# Patient Record
Sex: Female | Born: 1940
Health system: Southern US, Community
[De-identification: ages and names within clinical notes are randomized; demographics above are authoritative.]

## PROBLEM LIST (undated history)

## (undated) DIAGNOSIS — L02619 Cutaneous abscess of unspecified foot: Secondary | ICD-10-CM

## (undated) DIAGNOSIS — Z87442 Personal history of urinary calculi: Secondary | ICD-10-CM

## (undated) DIAGNOSIS — K5790 Diverticulosis of intestine, part unspecified, without perforation or abscess without bleeding: Secondary | ICD-10-CM

## (undated) DIAGNOSIS — K635 Polyp of colon: Secondary | ICD-10-CM

## (undated) DIAGNOSIS — E785 Hyperlipidemia, unspecified: Secondary | ICD-10-CM

## (undated) DIAGNOSIS — Z9981 Dependence on supplemental oxygen: Secondary | ICD-10-CM

## (undated) DIAGNOSIS — D649 Anemia, unspecified: Secondary | ICD-10-CM

## (undated) DIAGNOSIS — Z8719 Personal history of other diseases of the digestive system: Secondary | ICD-10-CM

## (undated) DIAGNOSIS — L03119 Cellulitis of unspecified part of limb: Secondary | ICD-10-CM

## (undated) DIAGNOSIS — J449 Chronic obstructive pulmonary disease, unspecified: Secondary | ICD-10-CM

## (undated) HISTORY — DX: Polyp of colon: K63.5

## (undated) HISTORY — PX: CATARACT EXTRACTION: SUR2

## (undated) HISTORY — PX: TONSILLECTOMY: SUR1361

## (undated) HISTORY — DX: Diverticulosis of intestine, part unspecified, without perforation or abscess without bleeding: K57.90

## (undated) HISTORY — DX: Dependence on supplemental oxygen: Z99.81

## (undated) HISTORY — DX: Hyperlipidemia, unspecified: E78.5

---

## 2002-02-23 ENCOUNTER — Ambulatory Visit (HOSPITAL_BASED_OUTPATIENT_CLINIC_OR_DEPARTMENT_OTHER): Admission: RE | Admit: 2002-02-23 | Discharge: 2002-02-23 | Payer: Self-pay | Admitting: Urology

## 2005-06-09 ENCOUNTER — Emergency Department (HOSPITAL_COMMUNITY): Admission: EM | Admit: 2005-06-09 | Discharge: 2005-06-09 | Payer: Self-pay | Admitting: Emergency Medicine

## 2005-06-11 ENCOUNTER — Ambulatory Visit: Payer: Self-pay | Admitting: Internal Medicine

## 2007-07-21 DIAGNOSIS — D649 Anemia, unspecified: Secondary | ICD-10-CM | POA: Insufficient documentation

## 2008-09-26 ENCOUNTER — Ambulatory Visit: Payer: Self-pay | Admitting: Internal Medicine

## 2008-09-26 ENCOUNTER — Inpatient Hospital Stay (HOSPITAL_COMMUNITY): Admission: EM | Admit: 2008-09-26 | Discharge: 2008-09-30 | Payer: Self-pay | Admitting: Emergency Medicine

## 2008-11-05 ENCOUNTER — Ambulatory Visit: Payer: Self-pay | Admitting: Internal Medicine

## 2008-11-05 DIAGNOSIS — J449 Chronic obstructive pulmonary disease, unspecified: Secondary | ICD-10-CM | POA: Insufficient documentation

## 2008-11-05 DIAGNOSIS — R7309 Other abnormal glucose: Secondary | ICD-10-CM | POA: Insufficient documentation

## 2008-11-05 DIAGNOSIS — F17200 Nicotine dependence, unspecified, uncomplicated: Secondary | ICD-10-CM | POA: Insufficient documentation

## 2008-12-04 ENCOUNTER — Ambulatory Visit: Payer: Self-pay | Admitting: Gastroenterology

## 2008-12-20 ENCOUNTER — Ambulatory Visit: Payer: Self-pay | Admitting: Gastroenterology

## 2008-12-20 ENCOUNTER — Encounter: Payer: Self-pay | Admitting: Gastroenterology

## 2008-12-25 ENCOUNTER — Encounter: Payer: Self-pay | Admitting: Gastroenterology

## 2009-03-07 ENCOUNTER — Encounter: Payer: Self-pay | Admitting: Internal Medicine

## 2010-12-07 ENCOUNTER — Encounter: Payer: Self-pay | Admitting: Internal Medicine

## 2011-03-31 NOTE — Discharge Summary (Signed)
Valerie Gay, Valerie Gay NO.:  [id number]   MEDICAL RECORD NO.:  [id number]          PATIENT TYPE:  INP   LOCATION:  5121                         FACILITY:  [facility]   PHYSICIAN:  Willow Ora, MD           DATE OF BIRTH:  1941/08/04   DATE OF ADMISSION:  09/26/2008  DATE OF DISCHARGE:  09/30/2008                               DISCHARGE SUMMARY   PRIMARY CARE DOCTOR:  Valetta Mole. Swords, MD at [facility] [place],  [place].   BRIEF HISTORY AND PHYSICAL:  Valerie Gay is a 70 year old white  female, current smoker, who presented to the emergency room with dyspnea  for approximately 4 days preceded by upper respiratory tract infection  and sore throat.  She admits to cough and nasal congestion.  Denies any  chest pain or palpitations.   PHYSICAL EXAMINATION:  VITAL SIGNS:  Blood pressure was 144/76, heart  rate 113, respiration rate 30, and O2 sat 89% on room air and 99% on non  rebreather mask.  CARDIOVASCULAR:  Tachycardic without a murmur.  LUNGS:  Poor air movement, tachypnea, and expiratory wheezing.  ABDOMEN:  Soft, nontender, and nondistended.  EXTREMITIES:  No edema.   LABORATORY AND X-RAYS:  Initial white count was 11.4 with a hemoglobin  of 14.4 and platelets of 454.  Potassium 4.1, creatinine 0.3, and blood  sugar was 141.  Cardiac enzymes were negative.  Calcium was negative.  Blood cultures are negative at the time of discharge.  D-dimer was  negative 0.34.  Hemoglobin A1c was 6.0.  BNP which is B-type natriuretic  peptide was 50 which is normal.  TSH was 3.5 which is normal.  Chest x-  ray showed emphysema without acute findings.   HOSPITAL COURSE:  The patient was admitted to the hospital with COPD  exacerbation as described above.  She was started on IV Solu-Medrol,  scheduled nebulizations, and Mucinex as well as Avelox and Tamiflu.  During this hospitalization, she also received a pneumonia shot.  She  slowly improved.  Prior to discharge, she was  looking and feeling very  comfortable with her breathing.  We checked her O2 sat on room air and  it was 92%.  During this admission, she was also counseled about the new  onset of borderline diabetes and I encouraged her to talk with her  primary care doctor regarding this diagnosis.  She was also counseled  about the risks of tobacco.  At this point, she got maximal hospital  benefit and she will be discharged with the following instructions.  1. Albuterol either nebulizations or 2 puffs every 6 hours as needed      for cough and wheezing.  2. Prednisone taper.  3. Avelox 400 mg p.o. daily for the next 3 days.  4. Mucinex DM 1 tablet twice a day until better.  5. She was advised to see her primary care doctor, Dr. Birdie Sons,      next week to follow up on her breathing and the new onset of      borderline diabetes.  6. We will add  Flonase 2 puffs daily on each side of the nose because      she seems to be slightly congested today.   ADMISSION DIAGNOSIS:  Chronic obstructive pulmonary disease  exacerbation.   DISCHARGE DIAGNOSES:  1. Chronic obstructive pulmonary disease exacerbation, improved.  2. New onset of borderline diabetes with a hemoglobin A1c of 6.0,      noting that the CBGs are in the low 100s even with prednisone by      mouth, to be followed up as an outpatient.  3. Ongoing tobacco abuse, counseling during this hospitalization.      Willow Ora, MD  Electronically Signed     JP/MEDQ  D:  09/30/2008  T:  10/01/2008  Job:  [id number]   cc:   Valetta Mole. Swords, MD

## 2011-04-03 NOTE — Op Note (Signed)
[facility]  Patient:    MARKAN, CAZAREZ Visit Number: [id number] MRN: [id number]          Service Type: NES Location: [facility] Attending Physician:  Lauree Chandler Dictated by:   Maretta Bees. Vonita Moss, M.D. Proc. Date: 02/23/02 Admit Date:  02/23/2002                             Operative Report  PREOPERATIVE DIAGNOSIS:  Gross hematuria, suspected from the right kidney and suspected mild hydronephrosis; rule out TCC.  POSTOPERATIVE DIAGNOSES:  Hematuria due to right renal calculus.  PROCEDURE:  Cystoscopy, bilateral retrograde pyelograms with interpretation, right ureteroscopy and right renal stone basketing with insertion of right double-J catheter.  SURGEON:  Maretta Bees. Vonita Moss, M.D.  ANESTHESIA:  General.  INDICATIONS:  A 70 year old lady who had grossly bloody urine last week, and renal ultrasound raised the question of slight fullness of the right collecting system in the kidney.  Cystoscopy revealed blood come from the right ureteral orifice.  The bladder was negative.  She is brought to the OR today for evaluation.  DESCRIPTION OF PROCEDURE:  The patient is brought to the operating room and placed in lithotomy position.  The external genitalia are prepped and draped in sterile fashion. She was cystoscoped and the bladder was normal.  Bilateral retrograde pyelograms were performed, and the left upper tract was normal. The right upper tract revealed a significant calyectasis.  Guide wire was placed up the right ureter and over that a ureteral dilating sheath was put into position.  Using the thin flexible ureteroscope, I was able to visualize the upper ureter and renal collecting system; saw no papillary tumors, but I did find a small irregular calculus. I used the Mitinol basket to grasp it and remove part of the stone, but some other smaller fragments were not retrievable.  Therefore, I decided to leave in a right double-J  catheter, which I did, and it was 6-French and 26 cm in size; with no string on it.  The bladder was emptied.  Cystoscope removed.  The patient sent to recovery room in good condition.Dictated by:   Maretta Bees. Vonita Moss, M.D. Attending Physician:  Lauree Chandler DD:  02/23/02 TD:  02/23/02 Job: [id number]

## 2011-08-18 LAB — CK TOTAL AND CKMB (NOT AT ARMC)
CK, MB: 2.9
Total CK: 43

## 2011-08-18 LAB — URINALYSIS, ROUTINE W REFLEX MICROSCOPIC: Ketones, ur: >80 — AB

## 2011-08-18 LAB — GLUCOSE, CAPILLARY
Glucose-Capillary: 121 — ABNORMAL HIGH
Glucose-Capillary: 124 — ABNORMAL HIGH
Glucose-Capillary: 134 — ABNORMAL HIGH
Glucose-Capillary: 136 — ABNORMAL HIGH
Glucose-Capillary: 137 — ABNORMAL HIGH
Glucose-Capillary: 138 — ABNORMAL HIGH

## 2011-08-18 LAB — CREATININE, SERUM
Creatinine, Ser: 0.74
GFR calc Af Amer: >60
GFR calc non Af Amer: >60

## 2011-08-18 LAB — TSH: TSH: 3.578

## 2011-08-18 LAB — CBC
HCT: 37
HCT: 43.4
Hemoglobin: 12.4
MCHC: 33.3
MCHC: 33.4
MCV: 96.6
Platelets: 351
RBC: 3.83 — ABNORMAL LOW
RBC: 4.51
RDW: 13.4
WBC: 11.4 — ABNORMAL HIGH

## 2011-08-18 LAB — CULTURE, BLOOD (ROUTINE X 2): Culture: NO GROWTH

## 2011-08-18 LAB — URINE MICROSCOPIC-ADD ON

## 2011-08-18 LAB — LIPID PANEL
HDL: 34 — ABNORMAL LOW
Triglycerides: 71

## 2011-08-18 LAB — CARDIAC PANEL(CRET KIN+CKTOT+MB+TROPI)
Relative Index: 3.9 — ABNORMAL HIGH
Relative Index: INVALID
Troponin I: <0.01

## 2011-08-18 LAB — BASIC METABOLIC PANEL
BUN: 15
CO2: 26
Chloride: 105
GFR calc non Af Amer: >60
Potassium: 4.1

## 2011-08-18 LAB — DIFFERENTIAL
Eosinophils Absolute: 0.1
Eosinophils Relative: 1
Monocytes Relative: 6
Neutro Abs: 8.1 — ABNORMAL HIGH

## 2011-08-18 LAB — HEMOGLOBIN A1C
Hgb A1c MFr Bld: 6
Mean Plasma Glucose: 126

## 2011-08-18 LAB — TROPONIN I: Troponin I: 0.01

## 2011-12-14 DIAGNOSIS — R059 Cough, unspecified: Secondary | ICD-10-CM | POA: Diagnosis not present

## 2011-12-14 DIAGNOSIS — J0100 Acute maxillary sinusitis, unspecified: Secondary | ICD-10-CM | POA: Diagnosis not present

## 2011-12-14 DIAGNOSIS — R05 Cough: Secondary | ICD-10-CM | POA: Diagnosis not present

## 2012-07-01 DIAGNOSIS — J209 Acute bronchitis, unspecified: Secondary | ICD-10-CM | POA: Diagnosis not present

## 2012-07-01 DIAGNOSIS — J449 Chronic obstructive pulmonary disease, unspecified: Secondary | ICD-10-CM | POA: Diagnosis not present

## 2012-09-21 DIAGNOSIS — H53009 Unspecified amblyopia, unspecified eye: Secondary | ICD-10-CM | POA: Diagnosis not present

## 2012-09-21 DIAGNOSIS — H25049 Posterior subcapsular polar age-related cataract, unspecified eye: Secondary | ICD-10-CM | POA: Diagnosis not present

## 2012-09-21 DIAGNOSIS — H3530 Unspecified macular degeneration: Secondary | ICD-10-CM | POA: Diagnosis not present

## 2012-09-21 DIAGNOSIS — H40019 Open angle with borderline findings, low risk, unspecified eye: Secondary | ICD-10-CM | POA: Diagnosis not present

## 2012-09-27 DIAGNOSIS — H353190 Nonexudative age-related macular degeneration, unspecified eye, stage unspecified: Secondary | ICD-10-CM | POA: Diagnosis not present

## 2012-09-27 DIAGNOSIS — H353290 Exudative age-related macular degeneration, unspecified eye, stage unspecified: Secondary | ICD-10-CM | POA: Diagnosis not present

## 2012-10-19 DIAGNOSIS — J449 Chronic obstructive pulmonary disease, unspecified: Secondary | ICD-10-CM | POA: Diagnosis not present

## 2012-10-19 DIAGNOSIS — J398 Other specified diseases of upper respiratory tract: Secondary | ICD-10-CM | POA: Diagnosis not present

## 2012-10-25 DIAGNOSIS — H353290 Exudative age-related macular degeneration, unspecified eye, stage unspecified: Secondary | ICD-10-CM | POA: Diagnosis not present

## 2012-11-24 DIAGNOSIS — Z23 Encounter for immunization: Secondary | ICD-10-CM | POA: Diagnosis not present

## 2012-11-29 DIAGNOSIS — H353290 Exudative age-related macular degeneration, unspecified eye, stage unspecified: Secondary | ICD-10-CM | POA: Diagnosis not present

## 2012-12-27 DIAGNOSIS — H353190 Nonexudative age-related macular degeneration, unspecified eye, stage unspecified: Secondary | ICD-10-CM | POA: Diagnosis not present

## 2012-12-27 DIAGNOSIS — H353290 Exudative age-related macular degeneration, unspecified eye, stage unspecified: Secondary | ICD-10-CM | POA: Diagnosis not present

## 2013-02-07 DIAGNOSIS — H353290 Exudative age-related macular degeneration, unspecified eye, stage unspecified: Secondary | ICD-10-CM | POA: Diagnosis not present

## 2013-03-28 DIAGNOSIS — H353290 Exudative age-related macular degeneration, unspecified eye, stage unspecified: Secondary | ICD-10-CM | POA: Diagnosis not present

## 2013-05-16 DIAGNOSIS — H353290 Exudative age-related macular degeneration, unspecified eye, stage unspecified: Secondary | ICD-10-CM | POA: Diagnosis not present

## 2013-07-13 DIAGNOSIS — H353290 Exudative age-related macular degeneration, unspecified eye, stage unspecified: Secondary | ICD-10-CM | POA: Diagnosis not present

## 2013-07-13 DIAGNOSIS — H353190 Nonexudative age-related macular degeneration, unspecified eye, stage unspecified: Secondary | ICD-10-CM | POA: Diagnosis not present

## 2013-07-13 DIAGNOSIS — H2510 Age-related nuclear cataract, unspecified eye: Secondary | ICD-10-CM | POA: Diagnosis not present

## 2013-08-04 DIAGNOSIS — H40059 Ocular hypertension, unspecified eye: Secondary | ICD-10-CM | POA: Diagnosis not present

## 2013-08-04 DIAGNOSIS — H25049 Posterior subcapsular polar age-related cataract, unspecified eye: Secondary | ICD-10-CM | POA: Diagnosis not present

## 2013-08-04 DIAGNOSIS — H353290 Exudative age-related macular degeneration, unspecified eye, stage unspecified: Secondary | ICD-10-CM | POA: Diagnosis not present

## 2013-08-04 DIAGNOSIS — H2510 Age-related nuclear cataract, unspecified eye: Secondary | ICD-10-CM | POA: Diagnosis not present

## 2013-08-15 DIAGNOSIS — H25049 Posterior subcapsular polar age-related cataract, unspecified eye: Secondary | ICD-10-CM | POA: Diagnosis not present

## 2013-08-15 DIAGNOSIS — H2510 Age-related nuclear cataract, unspecified eye: Secondary | ICD-10-CM | POA: Diagnosis not present

## 2013-08-15 DIAGNOSIS — H25019 Cortical age-related cataract, unspecified eye: Secondary | ICD-10-CM | POA: Diagnosis not present

## 2013-08-15 DIAGNOSIS — H2589 Other age-related cataract: Secondary | ICD-10-CM | POA: Diagnosis not present

## 2013-08-24 DIAGNOSIS — H353290 Exudative age-related macular degeneration, unspecified eye, stage unspecified: Secondary | ICD-10-CM | POA: Diagnosis not present

## 2013-10-07 DIAGNOSIS — J209 Acute bronchitis, unspecified: Secondary | ICD-10-CM | POA: Diagnosis not present

## 2013-10-07 DIAGNOSIS — J449 Chronic obstructive pulmonary disease, unspecified: Secondary | ICD-10-CM | POA: Diagnosis not present

## 2013-10-19 DIAGNOSIS — H353290 Exudative age-related macular degeneration, unspecified eye, stage unspecified: Secondary | ICD-10-CM | POA: Diagnosis not present

## 2013-10-30 ENCOUNTER — Encounter: Payer: Self-pay | Admitting: *Deleted

## 2013-11-01 ENCOUNTER — Encounter: Payer: Self-pay | Admitting: Gastroenterology

## 2013-12-12 DIAGNOSIS — H353190 Nonexudative age-related macular degeneration, unspecified eye, stage unspecified: Secondary | ICD-10-CM | POA: Diagnosis not present

## 2013-12-12 DIAGNOSIS — H353290 Exudative age-related macular degeneration, unspecified eye, stage unspecified: Secondary | ICD-10-CM | POA: Diagnosis not present

## 2013-12-12 DIAGNOSIS — H43819 Vitreous degeneration, unspecified eye: Secondary | ICD-10-CM | POA: Diagnosis not present

## 2013-12-12 DIAGNOSIS — H179 Unspecified corneal scar and opacity: Secondary | ICD-10-CM | POA: Diagnosis not present

## 2014-01-16 DIAGNOSIS — J449 Chronic obstructive pulmonary disease, unspecified: Secondary | ICD-10-CM | POA: Diagnosis not present

## 2014-02-27 DIAGNOSIS — H353290 Exudative age-related macular degeneration, unspecified eye, stage unspecified: Secondary | ICD-10-CM | POA: Diagnosis not present

## 2014-03-27 ENCOUNTER — Inpatient Hospital Stay (HOSPITAL_COMMUNITY)
Admission: EM | Admit: 2014-03-27 | Discharge: 2014-03-30 | DRG: 193 | Disposition: A | Discharge status: Home / Self Care | Payer: Medicare Other | Attending: Internal Medicine | Admitting: Internal Medicine

## 2014-03-27 ENCOUNTER — Encounter (HOSPITAL_COMMUNITY): Payer: Self-pay | Admitting: Emergency Medicine

## 2014-03-27 ENCOUNTER — Emergency Department (HOSPITAL_COMMUNITY): Payer: Medicare Other

## 2014-03-27 DIAGNOSIS — R062 Wheezing: Secondary | ICD-10-CM | POA: Diagnosis not present

## 2014-03-27 DIAGNOSIS — I10 Essential (primary) hypertension: Secondary | ICD-10-CM | POA: Diagnosis not present

## 2014-03-27 DIAGNOSIS — R0602 Shortness of breath: Secondary | ICD-10-CM | POA: Diagnosis not present

## 2014-03-27 DIAGNOSIS — J441 Chronic obstructive pulmonary disease with (acute) exacerbation: Secondary | ICD-10-CM | POA: Diagnosis not present

## 2014-03-27 DIAGNOSIS — J9600 Acute respiratory failure, unspecified whether with hypoxia or hypercapnia: Secondary | ICD-10-CM | POA: Diagnosis present

## 2014-03-27 DIAGNOSIS — Z7982 Long term (current) use of aspirin: Secondary | ICD-10-CM | POA: Diagnosis not present

## 2014-03-27 DIAGNOSIS — Z79899 Other long term (current) drug therapy: Secondary | ICD-10-CM | POA: Diagnosis not present

## 2014-03-27 DIAGNOSIS — F17200 Nicotine dependence, unspecified, uncomplicated: Secondary | ICD-10-CM | POA: Diagnosis present

## 2014-03-27 DIAGNOSIS — J449 Chronic obstructive pulmonary disease, unspecified: Secondary | ICD-10-CM

## 2014-03-27 DIAGNOSIS — D72829 Elevated white blood cell count, unspecified: Secondary | ICD-10-CM

## 2014-03-27 DIAGNOSIS — J189 Pneumonia, unspecified organism: Secondary | ICD-10-CM | POA: Diagnosis not present

## 2014-03-27 DIAGNOSIS — Z720 Tobacco use: Secondary | ICD-10-CM

## 2014-03-27 DIAGNOSIS — J4489 Other specified chronic obstructive pulmonary disease: Secondary | ICD-10-CM

## 2014-03-27 DIAGNOSIS — D649 Anemia, unspecified: Secondary | ICD-10-CM | POA: Diagnosis not present

## 2014-03-27 DIAGNOSIS — J9620 Acute and chronic respiratory failure, unspecified whether with hypoxia or hypercapnia: Secondary | ICD-10-CM | POA: Diagnosis present

## 2014-03-27 DIAGNOSIS — E876 Hypokalemia: Secondary | ICD-10-CM

## 2014-03-27 HISTORY — DX: Chronic obstructive pulmonary disease, unspecified: J44.9

## 2014-03-27 LAB — I-STAT TROPONIN, ED: TROPONIN I, POC: 0 ng/mL (ref 0.00–0.08)

## 2014-03-27 LAB — BASIC METABOLIC PANEL
BUN: 20 mg/dL (ref 6–23)
CO2: 26 mEq/L (ref 19–32)
Calcium: 9.6 mg/dL (ref 8.4–10.5)
Chloride: 98 mEq/L (ref 96–112)
Creatinine, Ser: 0.53 mg/dL (ref 0.50–1.10)
Glucose, Bld: 104 mg/dL — ABNORMAL HIGH (ref 70–99)
POTASSIUM: 3.4 meq/L — AB (ref 3.7–5.3)
SODIUM: 138 meq/L (ref 137–147)

## 2014-03-27 LAB — CBC
HCT: 39.4 % (ref 36.0–46.0)
HEMOGLOBIN: 13.2 g/dL (ref 12.0–15.0)
MCH: 31.9 pg (ref 26.0–34.0)
MCHC: 33.5 g/dL (ref 30.0–36.0)
MCV: 95.2 fL (ref 78.0–100.0)
Platelets: 480 10*3/uL — ABNORMAL HIGH (ref 150–400)
RBC: 4.14 MIL/uL (ref 3.87–5.11)
RDW: 13.6 % (ref 11.5–15.5)
WBC: 21.1 10*3/uL — AB (ref 4.0–10.5)

## 2014-03-27 MED ORDER — DEXTROSE 5 % IV SOLN
1.0000 g | INTRAVENOUS | Status: DC
  Administered 2014-03-27 – 2014-03-29 (×3): 1 g via INTRAVENOUS
  Filled 2014-03-27 (×4): qty 10

## 2014-03-27 MED ORDER — IPRATROPIUM BROMIDE 0.02 % IN SOLN
0.5000 mg | Freq: Once | RESPIRATORY_TRACT | Status: AC
  Administered 2014-03-27: 0.5 mg via RESPIRATORY_TRACT
  Filled 2014-03-27: qty 2.5

## 2014-03-27 MED ORDER — SODIUM CHLORIDE 0.9 % IV SOLN: INTRAVENOUS | Status: DC

## 2014-03-27 MED ORDER — METHYLPREDNISOLONE SODIUM SUCC 125 MG IJ SOLR
125.0000 mg | Freq: Once | INTRAMUSCULAR | Status: AC
  Administered 2014-03-27: 125 mg via INTRAVENOUS
  Filled 2014-03-27: qty 2

## 2014-03-27 MED ORDER — BUDESONIDE-FORMOTEROL FUMARATE 160-4.5 MCG/ACT IN AERO
2.0000 | INHALATION_SPRAY | Freq: Two times a day (BID) | RESPIRATORY_TRACT | Status: DC
  Administered 2014-03-28 – 2014-03-30 (×5): 2 via RESPIRATORY_TRACT
  Filled 2014-03-27 (×2): qty 6

## 2014-03-27 MED ORDER — DEXTROSE 5 % IV SOLN
500.0000 mg | INTRAVENOUS | Status: DC
  Administered 2014-03-27 – 2014-03-29 (×3): 500 mg via INTRAVENOUS
  Filled 2014-03-27 (×3): qty 500

## 2014-03-27 MED ORDER — ALBUTEROL SULFATE (2.5 MG/3ML) 0.083% IN NEBU
5.0000 mg | INHALATION_SOLUTION | Freq: Once | RESPIRATORY_TRACT | Status: AC
  Administered 2014-03-27: 5 mg via RESPIRATORY_TRACT
  Filled 2014-03-27: qty 6

## 2014-03-27 MED ORDER — SODIUM CHLORIDE 0.9 % IV SOLN
INTRAVENOUS | Status: DC
  Administered 2014-03-27: 21:00:00 via INTRAVENOUS
  Administered 2014-03-28: 1000 mL via INTRAVENOUS

## 2014-03-27 MED ORDER — POTASSIUM CHLORIDE CRYS ER 20 MEQ PO TBCR
20.0000 meq | EXTENDED_RELEASE_TABLET | Freq: Once | ORAL | Status: AC
  Administered 2014-03-27: 20 meq via ORAL
  Filled 2014-03-27: qty 1

## 2014-03-27 MED ORDER — METHYLPREDNISOLONE SODIUM SUCC 125 MG IJ SOLR
60.0000 mg | Freq: Four times a day (QID) | INTRAMUSCULAR | Status: DC
  Administered 2014-03-27 – 2014-03-28 (×3): 60 mg via INTRAVENOUS
  Filled 2014-03-27 (×8): qty 0.96

## 2014-03-27 MED ORDER — LEVOFLOXACIN IN D5W 750 MG/150ML IV SOLN
750.0000 mg | Freq: Once | INTRAVENOUS | Status: AC
  Administered 2014-03-27: 750 mg via INTRAVENOUS
  Filled 2014-03-27: qty 150

## 2014-03-27 MED ORDER — GUAIFENESIN-CODEINE 100-10 MG/5ML PO SOLN: 5.0000 mL | Freq: Four times a day (QID) | ORAL | Status: DC | PRN

## 2014-03-27 MED ORDER — PNEUMOCOCCAL VAC POLYVALENT 25 MCG/0.5ML IJ INJ
0.5000 mL | INJECTION | INTRAMUSCULAR | Status: AC
  Administered 2014-03-28: 0.5 mL via INTRAMUSCULAR
  Filled 2014-03-27: qty 0.5

## 2014-03-27 MED ORDER — ALBUTEROL SULFATE (2.5 MG/3ML) 0.083% IN NEBU: 5.0000 mg | INHALATION_SOLUTION | RESPIRATORY_TRACT | Status: AC | PRN

## 2014-03-27 MED ORDER — SODIUM CHLORIDE 0.9 % IV BOLUS (SEPSIS)
1000.0000 mL | Freq: Once | INTRAVENOUS | Status: AC
  Administered 2014-03-27: 1000 mL via INTRAVENOUS

## 2014-03-27 MED ORDER — NICOTINE 21 MG/24HR TD PT24: 21.0000 mg | MEDICATED_PATCH | Freq: Every day | TRANSDERMAL | Status: DC

## 2014-03-27 MED ORDER — IPRATROPIUM BROMIDE 0.02 % IN SOLN
0.5000 mg | Freq: Four times a day (QID) | RESPIRATORY_TRACT | Status: DC
  Administered 2014-03-27 – 2014-03-29 (×9): 0.5 mg via RESPIRATORY_TRACT
  Filled 2014-03-27 (×9): qty 2.5

## 2014-03-27 MED ORDER — LEVALBUTEROL HCL 0.63 MG/3ML IN NEBU
0.6300 mg | INHALATION_SOLUTION | Freq: Four times a day (QID) | RESPIRATORY_TRACT | Status: DC
  Administered 2014-03-27 – 2014-03-29 (×9): 0.63 mg via RESPIRATORY_TRACT
  Filled 2014-03-27 (×16): qty 3

## 2014-03-27 MED ORDER — BENZONATATE 100 MG PO CAPS
100.0000 mg | ORAL_CAPSULE | Freq: Three times a day (TID) | ORAL | Status: DC | PRN
  Filled 2014-03-27: qty 1

## 2014-03-27 MED ORDER — NICOTINE 21 MG/24HR TD PT24
21.0000 mg | MEDICATED_PATCH | TRANSDERMAL | Status: DC
  Administered 2014-03-27 – 2014-03-29 (×3): 21 mg via TRANSDERMAL
  Filled 2014-03-27 (×4): qty 1

## 2014-03-27 MED ORDER — ENOXAPARIN SODIUM 40 MG/0.4ML ~~LOC~~ SOLN
40.0000 mg | SUBCUTANEOUS | Status: DC
  Administered 2014-03-27 – 2014-03-29 (×3): 40 mg via SUBCUTANEOUS
  Filled 2014-03-27 (×4): qty 0.4

## 2014-03-27 MED ORDER — ASPIRIN EC 81 MG PO TBEC
81.0000 mg | DELAYED_RELEASE_TABLET | Freq: Every day | ORAL | Status: DC
  Administered 2014-03-27 – 2014-03-30 (×4): 81 mg via ORAL
  Filled 2014-03-27 (×4): qty 1

## 2014-03-27 MED ORDER — LEVALBUTEROL HCL 0.63 MG/3ML IN NEBU: 0.6300 mg | INHALATION_SOLUTION | RESPIRATORY_TRACT | Status: DC | PRN

## 2014-03-27 NOTE — Progress Notes (Signed)
NURSING PROGRESS NOTE  Valerie Gay [id number] Admission Data: 03/27/2014 6:57 PM Attending Provider: Mendel Corning, MD PCP:No PCP Per Patient Code Status: DNR  Valerie Gay is a 73 y.o. female patient admitted from ED:  -No acute distress noted.  -No complaints of shortness of breath.  -No complaints of chest pain.   Cardiac Monitoring: Box # [id number] in place. Cardiac monitor yields:normal sinus rhythm.  Blood pressure 123/75, pulse 110, temperature 98.1 F (36.7 C), temperature source Oral, resp. rate 33, height 5\' 4"  (1.626 m), weight 72.576 kg (160 lb), SpO2 98.00%.   IV Fluids:  IV in place, occlusive dsg intact without redness, IV cath antecubital left, condition patent and no redness normal saline @ 75.   Allergies:  Review of patient's allergies indicates no known allergies.  Past Medical History:   has a past medical history of Diverticulosis; Colon polyps; and COPD (chronic obstructive pulmonary disease).  Past Surgical History:   has past surgical history that includes Tonsillectomy.  Social History:   reports that she has been smoking Cigarettes.  She has been smoking about 0.50 packs per day. She does not have any smokeless tobacco history on file. She reports that she does not drink alcohol.  Skin: warm, dry intact

## 2014-03-27 NOTE — ED Notes (Signed)
Pt states that she had had increasing SOB over the last several days. States that she had to use her rescue inhaler this morning. Pt also reports a productive cough. Denies chest pain.

## 2014-03-27 NOTE — ED Notes (Signed)
Pt ambulated to restroom, exertional shortness of breath noted- lowest oxygen level 85% on room air, pt placed back on 2 liters and oxygen back up to 98%, pt denies any pain at this time.

## 2014-03-27 NOTE — H&P (Signed)
History and Physical       Hospital Admission Note Date: 03/27/2014  Patient name: Valerie Gay Medical record number: [id number] Date of birth: 1941/03/29 Age: 73 y.o. Gender: female  PCP: No PCP Per Patient    Chief Complaint:  Shortness of breath with wheezing, worsening in the last 3 days  HPI: Patient is a 73 year old female with history of COPD, nicotine abuse who has not been following with any physician presented to the ER with a 3 days of worsening chest tightness, difficulty breathing, wheezing and coughing. Patient reported that she was using multiple albuterol nebulizer treatments today without improvement. She had been blowing leaves in her yard 3 days ago on Saturday and since then her symptoms has worsened. She did have congestion and sore throat a few days ago. Patient took about 2-3 doses of 10 mg prednisone this weekend. She reported that her wheezing and shortness of breath worsened acutely today when she came to the ER. Otherwise she denied any recent long-distance travels or peripheral edema. In the ER patient was diffusely wheezing and increased WOB, tachypnea and tachycardia, requiring 02, hence hospitalist service was consulted for admission.   Review of Systems:  Constitutional: Denies fever, chills, diaphoresis, poor appetite and fatigue.  HEENT: Denies photophobia, eye pain, redness, hearing loss, ear pain, +congestion, + sore throat,+ rhinorrhea, no sneezing, mouth sores, trouble swallowing, neck pain, neck stiffness and tinnitus.   Respiratory: Please see history of present illness  Cardiovascular: Denies chest pain, palpitations and leg swelling.  Gastrointestinal: Denies nausea, vomiting, abdominal pain, diarrhea, constipation, blood in stool and abdominal distention.  Genitourinary: Denies dysuria, urgency, frequency, hematuria, flank pain and difficulty urinating.  Musculoskeletal: Denies myalgias,  back pain, joint swelling, arthralgias and gait problem.  Skin: Denies pallor, rash and wound.  Neurological: Denies dizziness, seizures, syncope, weakness, light-headedness, numbness and headaches.  Hematological: Denies adenopathy. Easy bruising, personal or family bleeding history  Psychiatric/Behavioral: Denies suicidal ideation, mood changes, confusion, nervousness, sleep disturbance and agitation  Past Medical History: Past Medical History  Diagnosis Date  . Diverticulosis   . Colon polyps     2010   . COPD (chronic obstructive pulmonary disease)    Past Surgical History  Procedure Laterality Date  . Tonsillectomy      Medications: Prior to Admission medications   Medication Sig Start Date End Date Taking? Authorizing Provider  albuterol (PROVENTIL) (2.5 MG/3ML) 0.083% nebulizer solution Take 2.5 mg by nebulization every 8 (eight) hours as needed for wheezing or shortness of breath.   Yes Historical Provider, MD  aspirin 81 MG tablet Take 81-162 mg by mouth daily.   Yes Historical Provider, MD  budesonide-formoterol (SYMBICORT) 160-4.5 MCG/ACT inhaler Inhale 2 puffs into the lungs 2 (two) times daily.   Yes Historical Provider, MD  ibuprofen (ADVIL,MOTRIN) 200 MG tablet Take 200 mg by mouth every 6 (six) hours as needed for moderate pain.   Yes Historical Provider, MD    Allergies:  No Known Allergies  Social History:  reports that she has been smoking Cigarettes.  She has been smoking about 0.50 packs per day. She does not have any smokeless tobacco history on file. She reports that she does not drink alcohol. Her drug history is not on file.  Family History: History reviewed. No pertinent family history.  Physical Exam: Blood pressure 121/93, pulse 119, temperature 98.6 F (37 C), temperature source Oral, resp. rate 23, height 5\' 4"  (1.626 m), weight 72.576 kg (160 lb), SpO2 95.00%. General: Alert, awake,  oriented x3, in no acute distress, able to complete full  sentences. HEENT: normocephalic, atraumatic, anicteric sclera, pink conjunctiva, pupils equal and reactive to light and accomodation, oropharynx clear Neck: supple, no masses or lymphadenopathy, no goiter, no bruits  Heart: Regular rate and rhythm, without murmurs, rubs or gallops. Lungs: Diffuse expiratory wheezing bilaterally, tight Abdomen: Soft, nontender, nondistended, positive bowel sounds, no masses. Extremities: No clubbing, cyanosis or edema with positive pedal pulses. Neuro: Grossly intact, no focal neurological deficits, strength 5/5 upper and lower extremities bilaterally Psych: alert and oriented x 3, normal mood and affect Skin: no rashes or lesions, warm and dry   LABS on Admission:  Basic Metabolic Panel:  Recent Labs Lab 03/27/14 1215  NA 138  K 3.4*  CL 98  CO2 26  GLUCOSE 104*  BUN 20  CREATININE 0.53  CALCIUM 9.6   Liver Function Tests: No results found for this basename: AST, ALT, ALKPHOS, BILITOT, PROT, ALBUMIN,  in the last 168 hours No results found for this basename: LIPASE, AMYLASE,  in the last 168 hours No results found for this basename: AMMONIA,  in the last 168 hours CBC:  Recent Labs Lab 03/27/14 1215  WBC 21.1*  HGB 13.2  HCT 39.4  MCV 95.2  PLT 480*   Cardiac Enzymes: No results found for this basename: CKTOTAL, CKMB, CKMBINDEX, TROPONINI,  in the last 168 hours BNP: No components found with this basename: POCBNP,  CBG: No results found for this basename: GLUCAP,  in the last 168 hours   Radiological Exams on Admission: Dg Chest 2 View (if Patient Has Fever And/or Copd)  03/27/2014   CLINICAL DATA:  Short of breath and cough  EXAM: CHEST  2 VIEW  COMPARISON:  Prior chest x-ray 09/26/2008  FINDINGS: Borderline cardiomegaly. Unchanged mediastinal contours. Trace atherosclerotic calcification in the transverse aorta. Increased patchy airspace opacity in the right lower lobe concerning for bronchopneumonia. This is superimposed on a  background of chronic bronchitic changes and interstitial prominence consistent with the clinical history of COPD. Probable upper lung predominant emphysematous changes. No pneumothorax or pleural effusion. The lungs are hyperinflated. No acute osseous abnormality.  IMPRESSION: Ill-defined patchy opacity in the right lung base left frontal view concerning for early bronchopneumonia. Recommend imaging follow-up to resolution to exclude underlying malignancy.  Background changes of COPD and emphysema.   Electronically Signed   By: Jacqulynn Cadet M.D.   On: 03/27/2014 13:47    Assessment/Plan Principal Problem:   Respiratory failure, acute secondary to COPD exacerbation with community-acquired pneumonia - Patient had some improvement in ED after multiple albuterol treatments, IV Solu-Medrol - Will admit to scheduled bronchodilators, placed on IV Solu-Medrol, IV antibiotics with Zithromax and Rocephin - Continue O2 via nasal cannula, Symbicort, Tessalon Perles, Robitussin, incentive spirometry - Obtain urine Legionella antigen, urine strep antigen  Active Problems:    Nicotine abuse - Patient has been smoking half to one pack per day, counseled strongly on smoking cessation - Placed on nicotine patch    Hypokalemia - Potassium being replaced in the ED, recheck in a.m.    Leukocytosis - Likely due to community-acquired pneumonia and steroids ( patient took 2-3 doses of 10 mg prednisone this weekend)   DVT prophylaxis: Lovenox  CODE STATUS: DNR/DNI, Okay for BiPAP  Family Communication: Admission, patients condition and plan of care including tests being ordered have been discussed with the patient who indicates understanding and agree with the plan and Code Status   Further plan will depend as  patient's clinical course evolves and further radiologic and laboratory data become available.   Time Spent on Admission: 1 hour  Ripudeep Krystal Eaton M.D. [facility] 03/27/2014, 3:55  PM Pager: [phone]  If 7PM-7AM, please contact night-coverage [email or web address] Password [email or web address]  **Disclaimer: This note was dictated with voice recognition software. Similar sounding words can inadvertently be transcribed and this note may contain transcription errors which may not have been corrected upon publication of note.**

## 2014-03-27 NOTE — ED Provider Notes (Signed)
Medical screening examination/treatment/procedure(s) were conducted as a shared visit with non-physician practitioner(s) and myself.  I personally evaluated the patient during the encounter.   EKG Interpretation   Date/Time:  Tuesday Mar 27 2014 12:47:20 EDT Ventricular Rate:  118 PR Interval:  138 QRS Duration: 72 QT Interval:  331 QTC Calculation: 464 R Axis:   93 Text Interpretation:  Age not entered, assumed to be  73 years old for  purpose of ECG interpretation Sinus tachycardia Anterior infarct, old No  significant change since last tracing Confirmed by Alauna Hayden  MD, [place]  ([id number]) on 03/27/2014 3:45:53 PM      I interviewed and examined the patient. Pt is mildly tachypneic, diffuse mild wheezing w/ diminished air flow. Cardiac exam wnl. Abdomen soft.  Minimal improvement w/ neb x 3. CXR w/ PNA. Will admit.   Blanchard Kelch, MD 03/27/14 2018

## 2014-03-27 NOTE — ED Provider Notes (Signed)
CSN: [id number]     Arrival date & time 03/27/14  1155 History   First MD Initiated Contact with Patient 03/27/14 1209     Chief Complaint  Patient presents with  . Shortness of Breath     (Consider location/radiation/quality/duration/timing/severity/associated sxs/prior Treatment) HPI  Patient with hx COPD reports two days worsening chest tightness, shortness of breath, wheezing, and cough.  Cough is productive of green and yellow sputum, different from her baseline white sputum.  Is taking all of her COPD medications at home.  Has taken multiple albuterol nebulizer treatments today without improvement.  States she was out blowing leaves 3 days ago and was exposured to a lot of pollen, which she states exacerbated her symptoms. She also had sore throat a few days ago but this gas resolved.  Took 2-3 doses 10mg  prednisone this weekend.    Denies recent immobilization, recent antibiotics, exogenous estrogen, leg swelling, hx blood clots.    Past Medical History  Diagnosis Date  . Diverticulosis   . Colon polyps     2010   . COPD (chronic obstructive pulmonary disease)    Past Surgical History  Procedure Laterality Date  . Tonsillectomy     History reviewed. No pertinent family history. History  Substance Use Topics  . Smoking status: Current Every Day Smoker -- 0.50 packs/day    Types: Cigarettes  . Smokeless tobacco: Not on file  . Alcohol Use: No   OB History   Grav Para Term Preterm Abortions TAB SAB Ect Mult Living                 Review of Systems  All other systems reviewed and are negative.     Allergies  Review of patient's allergies indicates no known allergies.  Home Medications   Prior to Admission medications   Not on File   BP 138/59  Pulse 119  Temp(Src) 98.2 F (36.8 C) (Oral)  Resp 24  Ht 5\' 4"  (1.626 m)  Wt 160 lb (72.576 kg)  BMI 27.45 kg/m2  SpO2 92% Physical Exam  Nursing note and vitals reviewed. Constitutional: She appears  well-developed and well-nourished. No distress.  HENT:  Head: Normocephalic and atraumatic.  Neck: Neck supple.  Cardiovascular: Normal rate and regular rhythm.   Pulmonary/Chest: Accessory muscle usage present. Tachypnea noted. No respiratory distress. She has decreased breath sounds. She has wheezes. She has no rhonchi. She has no rales.  Abdominal: Soft. She exhibits no distension. There is no tenderness. There is no rebound and no guarding.  Neurological: She is alert.  Skin: She is not diaphoretic.    ED Course  Procedures (including critical care time) Labs Review Labs Reviewed  BASIC METABOLIC PANEL - Abnormal; Notable for the following:    Potassium 3.4 (*)    Glucose, Bld 104 (*)    All other components within normal limits  CBC - Abnormal; Notable for the following:    WBC 21.1 (*)    Platelets 480 (*)    All other components within normal limits  I-STAT TROPOININ, ED    Imaging Review Dg Chest 2 View (if Patient Has Fever And/or Copd)  03/27/2014   CLINICAL DATA:  Short of breath and cough  EXAM: CHEST  2 VIEW  COMPARISON:  Prior chest x-ray 09/26/2008  FINDINGS: Borderline cardiomegaly. Unchanged mediastinal contours. Trace atherosclerotic calcification in the transverse aorta. Increased patchy airspace opacity in the right lower lobe concerning for bronchopneumonia. This is superimposed on a background of chronic bronchitic  changes and interstitial prominence consistent with the clinical history of COPD. Probable upper lung predominant emphysematous changes. No pneumothorax or pleural effusion. The lungs are hyperinflated. No acute osseous abnormality.  IMPRESSION: Ill-defined patchy opacity in the right lung base left frontal view concerning for early bronchopneumonia. Recommend imaging follow-up to resolution to exclude underlying malignancy.  Background changes of COPD and emphysema.   Electronically Signed   By: Jacqulynn Cadet M.D.   On: 03/27/2014 13:47     EKG  Interpretation None      1:46 PM Pt feels slight improvement with neb treatment.  Still not moving air well on exam.    Dr Aline Brochure made aware of the patient.   3:03 PM Pt starting to move air a bit more, now starting to wheeze.    MDM   Final diagnoses:  COPD (chronic obstructive pulmonary disease)  CAP (community acquired pneumonia)    Pt with hx COPD with 3 days of worsening SOB, increased work of breathing, change in sputum.  Pt only showing mild improvement with neb treatments.  CXR shows pneumonia.  Treated with levaquin.  Admitted to [facility] as unassigned patient.    Clayton Bibles, PA-C 03/27/14 1553

## 2014-03-28 DIAGNOSIS — D649 Anemia, unspecified: Secondary | ICD-10-CM | POA: Diagnosis not present

## 2014-03-28 DIAGNOSIS — D72829 Elevated white blood cell count, unspecified: Secondary | ICD-10-CM

## 2014-03-28 DIAGNOSIS — F17200 Nicotine dependence, unspecified, uncomplicated: Secondary | ICD-10-CM

## 2014-03-28 DIAGNOSIS — J449 Chronic obstructive pulmonary disease, unspecified: Secondary | ICD-10-CM | POA: Diagnosis not present

## 2014-03-28 DIAGNOSIS — J441 Chronic obstructive pulmonary disease with (acute) exacerbation: Secondary | ICD-10-CM | POA: Diagnosis not present

## 2014-03-28 DIAGNOSIS — J189 Pneumonia, unspecified organism: Secondary | ICD-10-CM | POA: Diagnosis not present

## 2014-03-28 LAB — BASIC METABOLIC PANEL
BUN: 22 mg/dL (ref 6–23)
CALCIUM: 8.7 mg/dL (ref 8.4–10.5)
CO2: 23 mEq/L (ref 19–32)
Chloride: 104 mEq/L (ref 96–112)
Creatinine, Ser: 0.54 mg/dL (ref 0.50–1.10)
GFR calc Af Amer: >90 mL/min (ref >90)
GLUCOSE: 143 mg/dL — AB (ref 70–99)
Potassium: 4.2 mEq/L (ref 3.7–5.3)
SODIUM: 140 meq/L (ref 137–147)

## 2014-03-28 LAB — CBC
HCT: 33.4 % — ABNORMAL LOW (ref 36.0–46.0)
Hemoglobin: 11 g/dL — ABNORMAL LOW (ref 12.0–15.0)
MCH: 31.7 pg (ref 26.0–34.0)
MCHC: 32.9 g/dL (ref 30.0–36.0)
MCV: 96.3 fL (ref 78.0–100.0)
PLATELETS: 427 10*3/uL — AB (ref 150–400)
RBC: 3.47 MIL/uL — ABNORMAL LOW (ref 3.87–5.11)
RDW: 13.9 % (ref 11.5–15.5)
WBC: 19.3 10*3/uL — AB (ref 4.0–10.5)

## 2014-03-28 MED ORDER — PREDNISONE 50 MG PO TABS
60.0000 mg | ORAL_TABLET | Freq: Every day | ORAL | Status: DC
  Administered 2014-03-29 – 2014-03-30 (×2): 60 mg via ORAL
  Filled 2014-03-28 (×3): qty 1

## 2014-03-28 NOTE — Progress Notes (Signed)
03/28/2014 1520 UR completed. Jonnie Finner RN CCM Case Mgmt phone [phone]

## 2014-03-28 NOTE — Progress Notes (Signed)
Chaplain responded to spiritual care consult for major life transition, "disruption in support system." Patient expressed feeling well today and having no emotional or spiritual needs. She said she is very "independent." She said her support comes from her family, whom "she feels she can talk about anything." She said her daughter and son-in-law visited last night, but her son is caring for a sick grandson at home. Chaplain performed spiritual care assessment and provided emotional support through empathic listening and pastoral presence, introduced patient to chaplain services and availability. She was grateful for visit.   Ethelene Browns [phone]

## 2014-03-28 NOTE — Progress Notes (Signed)
[facility] PROGRESS NOTE   Valerie Gay ENI:[id number] DOB: [birth year] DOA: 03/27/2014 PCP: No PCP Per Patient  HPI/Subjective: Feels much better, denies any fever. Still have some shortness of breath and cough.  Assessment/Plan: Principal Problem:   Respiratory failure, acute Active Problems:   COPD exacerbation   CAP (community acquired pneumonia)   Nicotine abuse   Hypokalemia   Leukocytosis    Acute respiratory failure  - Secondary to COPD exacerbation with community-acquired pneumonia  - Patient had some improvement in ED after multiple albuterol treatments, IV Solu-Medrol  - Will admit to scheduled bronchodilators, placed on IV Solu-Medrol, IV antibiotics with Zithromax and Rocephin  - Continue O2 via nasal cannula, Symbicort, Tessalon Perles, Robitussin, incentive spirometry  - Obtain urine Legionella antigen, urine strep antigen   RLL pneumonia -Community acquired pneumonia involving the RLL. -Started on antibiotics, denies any fever chills or night. -Continue supportive management with bronchodilators, mucolytics, until symptoms and oxygen as needed.  Tobacco abuse  - Patient has been smoking half to one pack per day, counseled strongly on smoking cessation  - Placed on nicotine patch   Hypokalemia  - Potassium being replaced in the ED, recheck in a.m.   Leukocytosis  - Likely due to community-acquired pneumonia and steroids ( patient took 2-3 doses of 10 mg prednisone this weekend)    Code Status: Full code Family Communication: Plan discussed with the patient. Disposition Plan: Remains inpatient   Consultants:  *None  Procedures:  None  Antibiotics:  Azithromycin and Rocephin   Objective: Filed Vitals:   03/28/14 0459  BP: 110/68  Pulse: 78  Temp: 98 F (36.7 C)  Resp: 20    Intake/Output Summary (Last 24 hours) at 03/28/14 1153 Last data filed at 03/28/14 0800  Gross per 24 hour  Intake    300 ml  Output    300 ml    Net      0 ml   Filed Weights   03/27/14 1206 03/27/14 1840  Weight: 72.576 kg (160 lb) 68.675 kg (151 lb 6.4 oz)    Exam: General: Alert and awake, oriented x3, not in any acute distress. HEENT: anicteric sclera, pupils reactive to light and accommodation, EOMI CVS: S1-S2 clear, no murmur rubs or gallops Chest: clear to auscultation bilaterally, no wheezing, rales or rhonchi Abdomen: soft nontender, nondistended, normal bowel sounds, no organomegaly Extremities: no cyanosis, clubbing or edema noted bilaterally Neuro: Cranial nerves II-XII intact, no focal neurological deficits  Data Reviewed: Basic Metabolic Panel:  Recent Labs Lab 03/27/14 1215 03/28/14 0426  NA 138 140  K 3.4* 4.2  CL 98 104  CO2 26 23  GLUCOSE 104* 143*  BUN 20 22  CREATININE 0.53 0.54  CALCIUM 9.6 8.7   Liver Function Tests: No results found for this basename: AST, ALT, ALKPHOS, BILITOT, PROT, ALBUMIN,  in the last 168 hours No results found for this basename: LIPASE, AMYLASE,  in the last 168 hours No results found for this basename: AMMONIA,  in the last 168 hours CBC:  Recent Labs Lab 03/27/14 1215 03/28/14 0426  WBC 21.1* 19.3*  HGB 13.2 11.0*  HCT 39.4 33.4*  MCV 95.2 96.3  PLT 480* 427*   Cardiac Enzymes: No results found for this basename: CKTOTAL, CKMB, CKMBINDEX, TROPONINI,  in the last 168 hours BNP (last 3 results) No results found for this basename: PROBNP,  in the last 8760 hours CBG: No results found for this basename: GLUCAP,  in the last 168 hours  Micro No results found for this or any previous visit (from the past 240 hour(s)).   Studies: Dg Chest 2 View (if Patient Has Fever And/or Copd)  03/27/2014   CLINICAL DATA:  Short of breath and cough  EXAM: CHEST  2 VIEW  COMPARISON:  Prior chest x-ray 09/26/2008  FINDINGS: Borderline cardiomegaly. Unchanged mediastinal contours. Trace atherosclerotic calcification in the transverse aorta. Increased patchy airspace opacity  in the right lower lobe concerning for bronchopneumonia. This is superimposed on a background of chronic bronchitic changes and interstitial prominence consistent with the clinical history of COPD. Probable upper lung predominant emphysematous changes. No pneumothorax or pleural effusion. The lungs are hyperinflated. No acute osseous abnormality.  IMPRESSION: Ill-defined patchy opacity in the right lung base left frontal view concerning for early bronchopneumonia. Recommend imaging follow-up to resolution to exclude underlying malignancy.  Background changes of COPD and emphysema.   Electronically Signed   By: Jacqulynn Cadet M.D.   On: 03/27/2014 13:47    Scheduled Meds: . sodium chloride   Intravenous STAT  . aspirin EC  81 mg Oral Daily  . azithromycin  500 mg Intravenous Q24H  . budesonide-formoterol  2 puff Inhalation BID  . cefTRIAXone (ROCEPHIN)  IV  1 g Intravenous Q24H  . enoxaparin (LOVENOX) injection  40 mg Subcutaneous Q24H  . levalbuterol  0.63 mg Nebulization Q6H   And  . ipratropium  0.5 mg Nebulization Q6H  . methylPREDNISolone (SOLU-MEDROL) injection  60 mg Intravenous Q6H  . nicotine  21 mg Transdermal Q24H  . pneumococcal 23 valent vaccine  0.5 mL Intramuscular Tomorrow-1000   Continuous Infusions: . sodium chloride 1,000 mL (03/28/14 1034)       Time spent: 35 minutes    [name] [facility]  [facility] Pager [phone] If 7PM-7AM, please contact night-coverage at [email or web address], password [facility] 03/28/2014, 11:53 AM  LOS: 1 day

## 2014-03-28 NOTE — Evaluation (Addendum)
Physical Therapy Evaluation Patient Details Name: Valerie Gay MRN: [id number] DOB: 12-16-1940 Today's Date: 03/28/2014   History of Present Illness   Pt is a 73 y.o. Female adm due to SOB and wheezing. Hx of COPD.   Clinical Impression  Pt adm due to the above. Presents with limitations in functional mobility primarily due to SOB and fatigue with mobility. Pt to benefit from skilled acute PT to address deficits indicated below and assess need for AD for D/C. Pt may benefit from rollator upon acute D/C due to fatigue with increased ambulation. See vitals for O2 sats. Pt will need to be mod I for D/c from mobility standpoint; plans to D/c home alone.     Follow Up Recommendations Outpatient PT;Other (comment) (for conditioning and high level balance activiites )    Equipment Recommendations  Other (comment) (Rollator )    Recommendations for Other Services       Precautions / Restrictions Restrictions Weight Bearing Restrictions: No      Mobility  Bed Mobility Overal bed mobility: Modified Independent             General bed mobility comments: HOB elevated and pt relied on handrails   Transfers Overall transfer level: Needs assistance Equipment used: None Transfers: Sit to/from Stand Sit to Stand: Min guard         General transfer comment: min guard to steady; slight sway but no LOB noted; cues for safety   Ambulation/Gait Ambulation/Gait assistance: Min guard Ambulation Distance (Feet): 100 Feet Assistive device:  (IV pole supporting Lt UE ) Gait Pattern/deviations: Step-through pattern;Decreased stride length;Wide base of support Gait velocity: decreased due to SOB Gait velocity interpretation: Below normal speed for age/gender General Gait Details: pt required standing rest break; pt with SOB throughout ambulation while ambulating on 2L O2; see vitals for O2 sats; using IV pole to suppot Lt UE with ambulation; may benefit from rollator   Stairs             Wheelchair Mobility    Modified Rankin (Stroke Patients Only)       Balance Overall balance assessment: Needs assistance Sitting-balance support: Feet supported;No upper extremity supported Sitting balance-Leahy Scale: Good     Standing balance support: During functional activity;Single extremity supported Standing balance-Leahy Scale: Fair Standing balance comment: Lt UE supported by IV pole pt at min guard to supervision                              Pertinent Vitals/Pain Ambulating on 2L O2 88%; with deep breathing and rest on 2L at 93%.    Home Living Family/patient expects to be discharged to:: Private residence Living Arrangements: Alone Available Help at Discharge: Family;Available PRN/intermittently Type of Home: House Home Access: Stairs to enter Entrance Stairs-Rails: Can reach both;Left;Right Entrance Stairs-Number of Steps: 3 Home Layout: Able to live on main level with bedroom/bathroom Home Equipment: Shower seat - built in      Prior Function Level of Independence: Independent               Hand Dominance        Extremity/Trunk Assessment   Upper Extremity Assessment: Defer to OT evaluation           Lower Extremity Assessment: Overall WFL for tasks assessed      Cervical / Trunk Assessment: Normal  Communication   Communication: No difficulties  Cognition Arousal/Alertness: Awake/alert Behavior During Therapy: [facility] for  tasks assessed/performed Overall Cognitive Status: Within Functional Limits for tasks assessed                      General Comments      Exercises        Assessment/Plan    PT Assessment Patient needs continued PT services  PT Diagnosis Generalized weakness;Difficulty walking   PT Problem List Decreased strength;Decreased activity tolerance;Decreased balance;Decreased mobility;Cardiopulmonary status limiting activity  PT Treatment Interventions DME instruction;Gait  training;Stair training;Functional mobility training;Therapeutic exercise;Therapeutic activities;Balance training;Neuromuscular re-education;Patient/family education   PT Goals (Current goals can be found in the Care Plan section) Acute Rehab PT Goals Patient Stated Goal: to get stronger and go home PT Goal Formulation: With patient Time For Goal Achievement: 04/04/14 Potential to Achieve Goals: Good    Frequency Min 3X/week   Barriers to discharge Decreased caregiver support lives alone; will need to be mod I     Co-evaluation               End of Session Equipment Utilized During Treatment: Gait belt;Oxygen Activity Tolerance: Patient limited by fatigue;Other (comment) (limited by SOB) Patient left: in chair;with call bell/phone within reach Nurse Communication: Mobility status;Precautions         Time: 2633-3545 PT Time Calculation (min): 16 min   Charges:   PT Evaluation $Initial PT Evaluation Tier I: 1 Procedure PT Treatments $Gait Training: 8-22 mins   PT G CodesKennis Carina [place], [place]  [phone] 03/28/2014, 11:50 AM

## 2014-03-28 NOTE — Progress Notes (Signed)
Nutrition Brief Note  Patient identified on the Malnutrition Screening Tool (MST) Report. Pt admits to recent <5 lb weight loss 2/2 a few days of poor PO intake. Currently with improved appetite. Denies questions regarding nutrition at this time. Appears well-nourished.  Wt Readings from Last 15 Encounters:  03/27/14 151 lb 6.4 oz (68.675 kg)  11/05/08 156 lb (70.761 kg)    Body mass index is 25.98 kg/(m^2). Patient meets criteria for Overweight based on current BMI.   Current diet order is Heart Healthy, patient is consuming approximately >50% of meals at this time. Labs and medications reviewed.   No nutrition interventions warranted at this time. If nutrition issues arise, please consult RD.   Inda Coke MS, RD, LDN Inpatient Registered Dietitian Pager: [phone] After-hours pager: [phone]

## 2014-03-28 NOTE — Progress Notes (Signed)
Pt met with pt to discuss advance directives.  Patient already has documents in place, but did not bring them with her to the hospital.  Pt encouraged to give copies to her doctors and discuss wishes with her family, as she has not done so.  Pt's questions re: documents answered.  Emotional support offered.  Social work will sign off. Please re-consult if additional patient needs should arise.

## 2014-03-29 DIAGNOSIS — J441 Chronic obstructive pulmonary disease with (acute) exacerbation: Secondary | ICD-10-CM | POA: Diagnosis not present

## 2014-03-29 DIAGNOSIS — E876 Hypokalemia: Secondary | ICD-10-CM

## 2014-03-29 DIAGNOSIS — J449 Chronic obstructive pulmonary disease, unspecified: Secondary | ICD-10-CM | POA: Diagnosis not present

## 2014-03-29 DIAGNOSIS — J189 Pneumonia, unspecified organism: Secondary | ICD-10-CM | POA: Diagnosis not present

## 2014-03-29 DIAGNOSIS — D649 Anemia, unspecified: Secondary | ICD-10-CM | POA: Diagnosis not present

## 2014-03-29 LAB — CBC
HCT: 34.3 % — ABNORMAL LOW (ref 36.0–46.0)
Hemoglobin: 11.2 g/dL — ABNORMAL LOW (ref 12.0–15.0)
MCH: 31.7 pg (ref 26.0–34.0)
MCHC: 32.7 g/dL (ref 30.0–36.0)
MCV: 97.2 fL (ref 78.0–100.0)
PLATELETS: 493 10*3/uL — AB (ref 150–400)
RBC: 3.53 MIL/uL — AB (ref 3.87–5.11)
RDW: 14.3 % (ref 11.5–15.5)
WBC: 21.1 10*3/uL — ABNORMAL HIGH (ref 4.0–10.5)

## 2014-03-29 LAB — URINALYSIS, ROUTINE W REFLEX MICROSCOPIC
Bilirubin Urine: NEGATIVE
Glucose, UA: NEGATIVE mg/dL
Hgb urine dipstick: NEGATIVE
Ketones, ur: NEGATIVE mg/dL
LEUKOCYTES UA: NEGATIVE
NITRITE: NEGATIVE
PROTEIN: NEGATIVE mg/dL
Specific Gravity, Urine: 1.03 (ref 1.005–1.030)
Urobilinogen, UA: 1 mg/dL (ref 0.0–1.0)
pH: 6 (ref 5.0–8.0)

## 2014-03-29 LAB — STREP PNEUMONIAE URINARY ANTIGEN: STREP PNEUMO URINARY ANTIGEN: NEGATIVE

## 2014-03-29 MED ORDER — IPRATROPIUM BROMIDE 0.02 % IN SOLN: 0.5000 mg | Freq: Four times a day (QID) | RESPIRATORY_TRACT | Status: DC

## 2014-03-29 MED ORDER — TRIAMCINOLONE ACETONIDE 55 MCG/ACT NA AERO
2.0000 | INHALATION_SPRAY | Freq: Every day | NASAL | Status: DC
  Administered 2014-03-29 – 2014-03-30 (×2): 2 via NASAL
  Filled 2014-03-29: qty 21.6

## 2014-03-29 MED ORDER — LEVALBUTEROL HCL 0.63 MG/3ML IN NEBU
0.6300 mg | INHALATION_SOLUTION | Freq: Four times a day (QID) | RESPIRATORY_TRACT | Status: DC
Start: 2014-03-30 — End: 2014-03-30

## 2014-03-29 MED ORDER — FUROSEMIDE 10 MG/ML IJ SOLN
40.0000 mg | Freq: Once | INTRAMUSCULAR | Status: AC
  Administered 2014-03-29: 40 mg via INTRAVENOUS
  Filled 2014-03-29: qty 4

## 2014-03-29 NOTE — Progress Notes (Signed)
[facility] PROGRESS NOTE   Valerie Gay TIR:[id number] DOB: 05/17/1941 DOA: 03/27/2014 PCP: No PCP Per Patient  HPI/Subjective: Feels much better, denies any fever. Still uses the oxygen.  Assessment/Plan: Principal Problem:   Respiratory failure, acute Active Problems:   COPD exacerbation   CAP (community acquired pneumonia)   Nicotine abuse   Hypokalemia   Leukocytosis    Acute respiratory failure  - Secondary to COPD exacerbation with community-acquired pneumonia  - Patient had some improvement in ED after multiple albuterol treatments, IV Solu-Medrol  - Will admit to scheduled bronchodilators, placed on IV Solu-Medrol, IV antibiotics with Zithromax and Rocephin  - Continue O2 via nasal cannula, Symbicort, Tessalon Perles, Robitussin, incentive spirometry  - Obtain urine Legionella antigen, urine strep antigen  - Weaned off of oxygen.  RLL pneumonia -Community acquired pneumonia involving the RLL. -Started on antibiotics, denies any fever chills or night. -Continue supportive management with bronchodilators, mucolytics, until symptoms and oxygen as needed.  Tobacco abuse  - Patient has been smoking half to one pack per day, counseled strongly on smoking cessation  - Placed on nicotine patch   Hypokalemia  - Potassium being replaced in the ED, recheck in a.m.   Leukocytosis  - Likely due to community-acquired pneumonia and steroids ( patient took 2-3 doses of 10 mg prednisone this weekend).    Code Status: Full code Family Communication: Plan discussed with the patient. Disposition Plan: Remains inpatient   Consultants:  *None  Procedures:  None  Antibiotics:  Azithromycin and Rocephin   Objective: Filed Vitals:   03/29/14 0510  BP: 101/58  Pulse: 61  Temp: 97.7 F (36.5 C)  Resp: 20   No intake or output data in the 24 hours ending 03/29/14 1104 Filed Weights   03/27/14 1206 03/27/14 1840 03/29/14 0510  Weight: 72.576 kg (160  lb) 68.675 kg (151 lb 6.4 oz) 68 kg (149 lb 14.6 oz)    Exam: General: Alert and awake, oriented x3, not in any acute distress. HEENT: anicteric sclera, pupils reactive to light and accommodation, EOMI CVS: S1-S2 clear, no murmur rubs or gallops Chest: clear to auscultation bilaterally, no wheezing, rales or rhonchi Abdomen: soft nontender, nondistended, normal bowel sounds, no organomegaly Extremities: no cyanosis, clubbing or edema noted bilaterally Neuro: Cranial nerves II-XII intact, no focal neurological deficits  Data Reviewed: Basic Metabolic Panel:  Recent Labs Lab 03/27/14 1215 03/28/14 0426  NA 138 140  K 3.4* 4.2  CL 98 104  CO2 26 23  GLUCOSE 104* 143*  BUN 20 22  CREATININE 0.53 0.54  CALCIUM 9.6 8.7   Liver Function Tests: No results found for this basename: AST, ALT, ALKPHOS, BILITOT, PROT, ALBUMIN,  in the last 168 hours No results found for this basename: LIPASE, AMYLASE,  in the last 168 hours No results found for this basename: AMMONIA,  in the last 168 hours CBC:  Recent Labs Lab 03/27/14 1215 03/28/14 0426 03/29/14 0533  WBC 21.1* 19.3* 21.1*  HGB 13.2 11.0* 11.2*  HCT 39.4 33.4* 34.3*  MCV 95.2 96.3 97.2  PLT 480* 427* 493*   Cardiac Enzymes: No results found for this basename: CKTOTAL, CKMB, CKMBINDEX, TROPONINI,  in the last 168 hours BNP (last 3 results) No results found for this basename: PROBNP,  in the last 8760 hours CBG: No results found for this basename: GLUCAP,  in the last 168 hours  Micro Recent Results (from the past 240 hour(s))  CULTURE, BLOOD (ROUTINE X 2)  Status: None   Collection Time    03/27/14  8:23 PM      Result Value Ref Range Status   Specimen Description BLOOD RIGHT ARM   Final   Special Requests BOTTLES DRAWN AEROBIC AND ANAEROBIC 10CC EACH   Final   Culture  Setup Time     Final   Value: 03/28/2014 00:48     Performed at [facility]   Culture     Final   Value:        BLOOD CULTURE RECEIVED  NO GROWTH TO DATE CULTURE WILL BE HELD FOR 5 DAYS BEFORE ISSUING A FINAL NEGATIVE REPORT     Performed at [facility]   Report Status PENDING   Incomplete  CULTURE, BLOOD (ROUTINE X 2)     Status: None   Collection Time    03/27/14  8:32 PM      Result Value Ref Range Status   Specimen Description BLOOD RIGHT FOREARM   Final   Special Requests BOTTLES DRAWN AEROBIC ONLY 10CC   Final   Culture  Setup Time     Final   Value: 03/28/2014 00:48     Performed at [facility]   Culture     Final   Value:        BLOOD CULTURE RECEIVED NO GROWTH TO DATE CULTURE WILL BE HELD FOR 5 DAYS BEFORE ISSUING A FINAL NEGATIVE REPORT     Performed at [facility]   Report Status PENDING   Incomplete     Studies: Dg Chest 2 View (if Patient Has Fever And/or Copd)  03/27/2014   CLINICAL DATA:  Short of breath and cough  EXAM: CHEST  2 VIEW  COMPARISON:  Prior chest x-ray 09/26/2008  FINDINGS: Borderline cardiomegaly. Unchanged mediastinal contours. Trace atherosclerotic calcification in the transverse aorta. Increased patchy airspace opacity in the right lower lobe concerning for bronchopneumonia. This is superimposed on a background of chronic bronchitic changes and interstitial prominence consistent with the clinical history of COPD. Probable upper lung predominant emphysematous changes. No pneumothorax or pleural effusion. The lungs are hyperinflated. No acute osseous abnormality.  IMPRESSION: Ill-defined patchy opacity in the right lung base left frontal view concerning for early bronchopneumonia. Recommend imaging follow-up to resolution to exclude underlying malignancy.  Background changes of COPD and emphysema.   Electronically Signed   By: Jacqulynn Cadet M.D.   On: 03/27/2014 13:47    Scheduled Meds: . aspirin EC  81 mg Oral Daily  . azithromycin  500 mg Intravenous Q24H  . budesonide-formoterol  2 puff Inhalation BID  . cefTRIAXone (ROCEPHIN)  IV  1 g Intravenous Q24H  .  enoxaparin (LOVENOX) injection  40 mg Subcutaneous Q24H  . levalbuterol  0.63 mg Nebulization Q6H   And  . ipratropium  0.5 mg Nebulization Q6H  . nicotine  21 mg Transdermal Q24H  . predniSONE  60 mg Oral Q breakfast   Continuous Infusions:       Time spent: 35 minutes    Valerie Gay  [facility] Pager [phone] If 7PM-7AM, please contact night-coverage at [email or web address], password [facility] 03/29/2014, 11:04 AM  LOS: 2 days

## 2014-03-29 NOTE — Progress Notes (Signed)
Physical Therapy Treatment Patient Details Name: Valerie Gay MRN: [id number] DOB: 05-Apr-1941 Today's Date: 04/20/2014    History of Present Illness      PT Comments    Pt cooperative and did well with rollator and stairs.  Pt on 1 L/min at rest and had to be increased to 2 L/min with gait, but pt did de-sat to 86% on 2 L.  Follow Up Recommendations        Equipment Recommendations       Recommendations for Other Services       Precautions / Restrictions      Mobility  Bed Mobility Overal bed mobility: Modified Independent                Transfers     Transfers: Sit to/from Stand Sit to Stand: Supervision         General transfer comment:  (cues for hand placement)  Ambulation/Gait Ambulation/Gait assistance: Supervision Ambulation Distance (Feet): 120 Feet (plus 75' x 2 and 30') Assistive device: 4-wheeled walker Gait Pattern/deviations: Step-through pattern Gait velocity: decreased due to SOB   General Gait Details: several rest breaks with O2 monitoring.  Only took 1 sitting rest break where pt was educated on safety with use of rollator.   Stairs Stairs: Yes Stairs assistance: Min guard Stair Management: One rail Right;Forwards;Backwards Number of Stairs: 3 General stair comments: Went up forwards and down backwards  Wheelchair Mobility    Modified Rankin (Stroke Patients Only)       Balance             Standing balance-Leahy Scale: Fair                      Cognition Arousal/Alertness: Awake/alert Behavior During Therapy: WFL for tasks assessed/performed Overall Cognitive Status: Within Functional Limits for tasks assessed                      Exercises      General Comments        Pertinent Vitals/Pain 86% on 2 L/min with gait  Was 95% on 1 L/min at rest pre and post gait.    Home Living                      Prior Function            PT Goals (current goals can now be found in  the care plan section) Acute Rehab PT Goals Patient Stated Goal: to get stronger and go home Progress towards PT goals: Progressing toward goals    Frequency       PT Plan Current plan remains appropriate    Co-evaluation             End of Session Equipment Utilized During Treatment: Gait belt;Oxygen Activity Tolerance: Patient tolerated treatment well;Other (comment) (de-sats) Patient left: in bed;with call bell/phone within reach (sitting EOB)     Time: 6948-5462 PT Time Calculation (min): 29 min  Charges:  $Gait Training: 23-37 mins                    G Codes:      Galen [place] 04/20/2014, 10:47 AM

## 2014-03-29 NOTE — Progress Notes (Signed)
SATURATION QUALIFICATIONS: (This note is used to comply with regulatory documentation for home oxygen)  Patient Saturations on Room Air at Rest = 93%  Patient Saturations on Room Air while Ambulating = 84%  Patient Saturations on 2 Liters of oxygen while Ambulating = 86%

## 2014-03-30 DIAGNOSIS — D649 Anemia, unspecified: Secondary | ICD-10-CM | POA: Diagnosis not present

## 2014-03-30 DIAGNOSIS — J189 Pneumonia, unspecified organism: Secondary | ICD-10-CM | POA: Diagnosis not present

## 2014-03-30 DIAGNOSIS — J449 Chronic obstructive pulmonary disease, unspecified: Secondary | ICD-10-CM | POA: Diagnosis not present

## 2014-03-30 DIAGNOSIS — J441 Chronic obstructive pulmonary disease with (acute) exacerbation: Secondary | ICD-10-CM | POA: Diagnosis not present

## 2014-03-30 LAB — CBC
HEMATOCRIT: 35.8 % — AB (ref 36.0–46.0)
HEMOGLOBIN: 11.6 g/dL — AB (ref 12.0–15.0)
MCH: 31.4 pg (ref 26.0–34.0)
MCHC: 32.4 g/dL (ref 30.0–36.0)
MCV: 96.8 fL (ref 78.0–100.0)
Platelets: 474 10*3/uL — ABNORMAL HIGH (ref 150–400)
RBC: 3.7 MIL/uL — AB (ref 3.87–5.11)
RDW: 14 % (ref 11.5–15.5)
WBC: 16.7 10*3/uL — AB (ref 4.0–10.5)

## 2014-03-30 LAB — URINE CULTURE
Colony Count: NO GROWTH
Culture: NO GROWTH

## 2014-03-30 LAB — BASIC METABOLIC PANEL
BUN: 42 mg/dL — ABNORMAL HIGH (ref 6–23)
CHLORIDE: 105 meq/L (ref 96–112)
CO2: 32 meq/L (ref 19–32)
Calcium: 9.1 mg/dL (ref 8.4–10.5)
Creatinine, Ser: 0.67 mg/dL (ref 0.50–1.10)
GFR calc non Af Amer: 84 mL/min — ABNORMAL LOW (ref >90)
Glucose, Bld: 100 mg/dL — ABNORMAL HIGH (ref 70–99)
POTASSIUM: 3.8 meq/L (ref 3.7–5.3)
SODIUM: 146 meq/L (ref 137–147)

## 2014-03-30 LAB — LEGIONELLA ANTIGEN, URINE: Legionella Antigen, Urine: NEGATIVE

## 2014-03-30 MED ORDER — TIOTROPIUM BROMIDE MONOHYDRATE 18 MCG IN CAPS: 18.0000 ug | ORAL_CAPSULE | Freq: Every day | RESPIRATORY_TRACT | Status: DC

## 2014-03-30 MED ORDER — DM-GUAIFENESIN ER 30-600 MG PO TB12: 2.0000 | ORAL_TABLET | Freq: Two times a day (BID) | ORAL | Status: DC

## 2014-03-30 MED ORDER — PREDNISONE (PAK) 10 MG PO TABS: ORAL_TABLET | ORAL | Status: DC

## 2014-03-30 MED ORDER — ALBUTEROL SULFATE HFA 108 (90 BASE) MCG/ACT IN AERS: 2.0000 | INHALATION_SPRAY | Freq: Four times a day (QID) | RESPIRATORY_TRACT | Status: DC | PRN

## 2014-03-30 MED ORDER — TRIAMCINOLONE ACETONIDE 55 MCG/ACT NA AERO: 2.0000 | INHALATION_SPRAY | Freq: Every day | NASAL | Status: DC

## 2014-03-30 MED ORDER — LEVOFLOXACIN 750 MG PO TABS: 750.0000 mg | ORAL_TABLET | Freq: Every day | ORAL | Status: DC

## 2014-03-30 MED ORDER — TIOTROPIUM BROMIDE MONOHYDRATE 18 MCG IN CAPS
18.0000 ug | ORAL_CAPSULE | Freq: Every day | RESPIRATORY_TRACT | Status: DC
  Administered 2014-03-30: 18 ug via RESPIRATORY_TRACT
  Filled 2014-03-30: qty 5

## 2014-03-30 MED ORDER — IPRATROPIUM BROMIDE 0.02 % IN SOLN: 0.5000 mg | Freq: Four times a day (QID) | RESPIRATORY_TRACT | Status: DC

## 2014-03-30 MED ORDER — LEVALBUTEROL HCL 0.63 MG/3ML IN NEBU
0.6300 mg | INHALATION_SOLUTION | Freq: Four times a day (QID) | RESPIRATORY_TRACT | Status: DC
Start: 2014-03-30 — End: 2014-03-30
  Administered 2014-03-30: 0.63 mg via RESPIRATORY_TRACT
  Filled 2014-03-30 (×5): qty 3

## 2014-03-30 MED ORDER — AZITHROMYCIN 500 MG PO TABS
500.0000 mg | ORAL_TABLET | Freq: Every day | ORAL | Status: DC
  Administered 2014-03-30: 500 mg via ORAL
  Filled 2014-03-30: qty 1

## 2014-03-30 MED ORDER — ALBUTEROL SULFATE HFA 108 (90 BASE) MCG/ACT IN AERS
2.0000 | INHALATION_SPRAY | Freq: Four times a day (QID) | RESPIRATORY_TRACT | Status: DC | PRN
  Filled 2014-03-30: qty 6.7

## 2014-03-30 NOTE — Discharge Summary (Signed)
Physician Discharge Summary  EPHRATA VERVILLE FIE:[id number] DOB: 1941-03-03 DOA: 03/27/2014  PCP: No PCP Per Patient  Admit date: 03/27/2014 Discharge date: 03/30/2014  Time spent: 40 minutes  Recommendations for Outpatient Follow-up:  1. Follow with primary care physician and pulmonology.  Discharge Diagnoses:  Principal Problem:   Respiratory failure, acute Active Problems:   COPD exacerbation   CAP (community acquired pneumonia)   Nicotine abuse   Hypokalemia   Leukocytosis   Discharge Condition: Stable  Diet recommendation: Heart healthy diet  Filed Weights   03/27/14 1206 03/27/14 1840 03/29/14 0510  Weight: 72.576 kg (160 lb) 68.675 kg (151 lb 6.4 oz) 68 kg (149 lb 14.6 oz)    History of present illness:  Patient is a 73 year old female with history of COPD, nicotine abuse who has not been following with any physician presented to the ER with a 3 days of worsening chest tightness, difficulty breathing, wheezing and coughing. Patient reported that she was using multiple albuterol nebulizer treatments today without improvement. She had been blowing leaves in her yard 3 days ago on Saturday and since then her symptoms has worsened. She did have congestion and sore throat a few days ago. Patient took about 2-3 doses of 10 mg prednisone this weekend. She reported that her wheezing and shortness of breath worsened acutely today when she came to the ER. Otherwise she denied any recent long-distance travels or peripheral edema. In the ER patient was diffusely wheezing and increased WOB, tachypnea and tachycardia, requiring 02, hence hospitalist service was consulted for admission.  Hospital Course:   Acute respiratory failure  - Secondary to COPD exacerbation with community-acquired pneumonia  - Patient had some improvement in ED after multiple albuterol treatments, IV Solu-Medrol  - Was scheduled bronchodilators, IV Solu-Medrol, IV antibiotics with Zithromax and Rocephin  -  Continue O2 via nasal cannula, Symbicort, Tessalon Perles, Robitussin, incentive spirometry  - Obtain urine Legionella antigen, urine strep antigen  - Oxygen saturation drops to 84% with ambulation, needs home oxygen. - Discharged on Levaquin for 5 more days, Spiriva and followup with PCP and pulmonology as outpatient.  RLL pneumonia  -Community acquired pneumonia involving the RLL.  -Was on this for Zithromax and Rocephin in the hospital, discharged on 5 more days of Levaquin. -Continue supportive management with bronchodilators, mucolytics, until symptoms and oxygen as needed.   Tobacco abuse  - Patient has been smoking half to one pack per day, counseled strongly on smoking cessation   Hypokalemia  - Potassium being repleted with oral supplements  Leukocytosis  - Likely due to community-acquired pneumonia and steroids ( patient took 2-3 doses of 10 mg prednisone this weekend).  - Follow up on WBC as out pt.   Procedures:  None  Consultations:  None  Discharge Exam: Filed Vitals:   03/30/14 0556  BP: 123/78  Pulse:   Temp:   Resp:    General: Alert and awake, oriented x3, not in any acute distress. HEENT: anicteric sclera, pupils reactive to light and accommodation, EOMI CVS: S1-S2 clear, no murmur rubs or gallops Chest: Faint expiratory wheezing Abdomen: soft nontender, nondistended, normal bowel sounds, no organomegaly Extremities: no cyanosis, clubbing or edema noted bilaterally Neuro: Cranial nerves II-XII intact, no focal neurological deficits   Discharge Instructions You were cared for by a hospitalist during your hospital stay. If you have any questions about your discharge medications or the care you received while you were in the hospital after you are discharged, you can call the unit  and asked to speak with the hospitalist on call if the hospitalist that took care of you is not available. Once you are discharged, your primary care physician will handle any  further medical issues. Please note that NO REFILLS for any discharge medications will be authorized once you are discharged, as it is imperative that you return to your primary care physician (or establish a relationship with a primary care physician if you do not have one) for your aftercare needs so that they can reassess your need for medications and monitor your lab values.  Discharge Instructions   Diet - low sodium heart healthy    Complete by:  As directed      Increase activity slowly    Complete by:  As directed             Medication List         albuterol (2.5 MG/3ML) 0.083% nebulizer solution  Commonly known as:  PROVENTIL  Take 2.5 mg by nebulization every 8 (eight) hours as needed for wheezing or shortness of breath.     albuterol 108 (90 BASE) MCG/ACT inhaler  Commonly known as:  PROVENTIL HFA;VENTOLIN HFA  Inhale 2 puffs into the lungs every 6 (six) hours as needed for wheezing or shortness of breath.     aspirin 81 MG tablet  Take 81-162 mg by mouth daily.     budesonide-formoterol 160-4.5 MCG/ACT inhaler  Commonly known as:  SYMBICORT  Inhale 2 puffs into the lungs 2 (two) times daily.     dextromethorphan-guaiFENesin 30-600 MG per 12 hr tablet  Commonly known as:  MUCINEX DM  Take 2 tablets by mouth 2 (two) times daily.     ibuprofen 200 MG tablet  Commonly known as:  ADVIL,MOTRIN  Take 200 mg by mouth every 6 (six) hours as needed for moderate pain.     levofloxacin 750 MG tablet  Commonly known as:  LEVAQUIN  Take 1 tablet (750 mg total) by mouth daily.     predniSONE 10 MG tablet  Commonly known as:  STERAPRED UNI-PAK  Take 6-5-4-3-2-1 tablet PO daily     tiotropium 18 MCG inhalation capsule  Commonly known as:  SPIRIVA HANDIHALER  Place 1 capsule (18 mcg total) into inhaler and inhale daily.     triamcinolone 55 MCG/ACT Aero nasal inhaler  Commonly known as:  NASACORT  Place 2 sprays into the nose daily.       No Known Allergies      Follow-up Information   Follow up with RAMASWAMY,MURALI, MD. Schedule an appointment as soon as possible for a visit in 1 week.   Specialty:  Pulmonary Disease   Contact information:   [place] [address] [phone]        The results of significant diagnostics from this hospitalization (including imaging, microbiology, ancillary and laboratory) are listed below for reference.    Significant Diagnostic Studies: Dg Chest 2 View (if Patient Has Fever And/or Copd)  03/27/2014   CLINICAL DATA:  Short of breath and cough  EXAM: CHEST  2 VIEW  COMPARISON:  Prior chest x-ray 09/26/2008  FINDINGS: Borderline cardiomegaly. Unchanged mediastinal contours. Trace atherosclerotic calcification in the transverse aorta. Increased patchy airspace opacity in the right lower lobe concerning for bronchopneumonia. This is superimposed on a background of chronic bronchitic changes and interstitial prominence consistent with the clinical history of COPD. Probable upper lung predominant emphysematous changes. No pneumothorax or pleural effusion. The lungs are hyperinflated. No acute  osseous abnormality.  IMPRESSION: Ill-defined patchy opacity in the right lung base left frontal view concerning for early bronchopneumonia. Recommend imaging follow-up to resolution to exclude underlying malignancy.  Background changes of COPD and emphysema.   Electronically Signed   By: Jacqulynn Cadet M.D.   On: 03/27/2014 13:47    Microbiology: Recent Results (from the past 240 hour(s))  CULTURE, BLOOD (ROUTINE X 2)     Status: None   Collection Time    03/27/14  8:23 PM      Result Value Ref Range Status   Specimen Description BLOOD RIGHT ARM   Final   Special Requests BOTTLES DRAWN AEROBIC AND ANAEROBIC 10CC EACH   Final   Culture  Setup Time     Final   Value: 03/28/2014 00:48     Performed at [facility]   Culture     Final   Value:        BLOOD CULTURE RECEIVED NO GROWTH TO DATE CULTURE WILL  BE HELD FOR 5 DAYS BEFORE ISSUING A FINAL NEGATIVE REPORT     Performed at [facility]   Report Status PENDING   Incomplete  CULTURE, BLOOD (ROUTINE X 2)     Status: None   Collection Time    03/27/14  8:32 PM      Result Value Ref Range Status   Specimen Description BLOOD RIGHT FOREARM   Final   Special Requests BOTTLES DRAWN AEROBIC ONLY 10CC   Final   Culture  Setup Time     Final   Value: 03/28/2014 00:48     Performed at [facility]   Culture     Final   Value:        BLOOD CULTURE RECEIVED NO GROWTH TO DATE CULTURE WILL BE HELD FOR 5 DAYS BEFORE ISSUING A FINAL NEGATIVE REPORT     Performed at [facility]   Report Status PENDING   Incomplete     Labs: Basic Metabolic Panel:  Recent Labs Lab 03/27/14 1215 03/28/14 0426 03/30/14 0544  NA 138 140 146  K 3.4* 4.2 3.8  CL 98 104 105  CO2 26 23 32  GLUCOSE 104* 143* 100*  BUN 20 22 42*  CREATININE 0.53 0.54 0.67  CALCIUM 9.6 8.7 9.1   Liver Function Tests: No results found for this basename: AST, ALT, ALKPHOS, BILITOT, PROT, ALBUMIN,  in the last 168 hours No results found for this basename: LIPASE, AMYLASE,  in the last 168 hours No results found for this basename: AMMONIA,  in the last 168 hours CBC:  Recent Labs Lab 03/27/14 1215 03/28/14 0426 03/29/14 0533 03/30/14 0544  WBC 21.1* 19.3* 21.1* 16.7*  HGB 13.2 11.0* 11.2* 11.6*  HCT 39.4 33.4* 34.3* 35.8*  MCV 95.2 96.3 97.2 96.8  PLT 480* 427* 493* 474*   Cardiac Enzymes: No results found for this basename: CKTOTAL, CKMB, CKMBINDEX, TROPONINI,  in the last 168 hours BNP: BNP (last 3 results) No results found for this basename: PROBNP,  in the last 8760 hours CBG: No results found for this basename: GLUCAP,  in the last 168 hours     Signed:  [name]  [facility] 03/30/2014, 10:51 AM

## 2014-03-30 NOTE — Progress Notes (Signed)
NURSING PROGRESS NOTE  Valerie Gay [id number] Discharge Data: 03/30/2014 3:02 PM Attending Provider: [name], MD PCP:No PCP Per Patient     [name] to be D/C'd Home per MD order.  Discussed with the patient the After Visit Summary and all questions fully answered. All IV's discontinued with no bleeding noted. All belongings returned to patient for patient to take home.   Last Vital Signs:  Blood pressure 136/94, pulse 74, temperature 98.1 F (36.7 C), temperature source Oral, resp. rate 20, height 5\' 4"  (1.626 m), weight 68 kg (149 lb 14.6 oz), SpO2 96.00%.  Discharge Medication List   Medication List         albuterol (2.5 MG/3ML) 0.083% nebulizer solution  Commonly known as:  PROVENTIL  Take 2.5 mg by nebulization every 8 (eight) hours as needed for wheezing or shortness of breath.     albuterol 108 (90 BASE) MCG/ACT inhaler  Commonly known as:  PROVENTIL HFA;VENTOLIN HFA  Inhale 2 puffs into the lungs every 6 (six) hours as needed for wheezing or shortness of breath.     aspirin 81 MG tablet  Take 81-162 mg by mouth daily.     budesonide-formoterol 160-4.5 MCG/ACT inhaler  Commonly known as:  SYMBICORT  Inhale 2 puffs into the lungs 2 (two) times daily.     dextromethorphan-guaiFENesin 30-600 MG per 12 hr tablet  Commonly known as:  MUCINEX DM  Take 2 tablets by mouth 2 (two) times daily.     ibuprofen 200 MG tablet  Commonly known as:  ADVIL,MOTRIN  Take 200 mg by mouth every 6 (six) hours as needed for moderate pain.     levofloxacin 750 MG tablet  Commonly known as:  LEVAQUIN  Take 1 tablet (750 mg total) by mouth daily.     predniSONE 10 MG tablet  Commonly known as:  STERAPRED UNI-PAK  Take 6-5-4-3-2-1 tablet PO daily     tiotropium 18 MCG inhalation capsule  Commonly known as:  SPIRIVA HANDIHALER  Place 1 capsule (18 mcg total) into inhaler and inhale daily.     triamcinolone 55 MCG/ACT Aero nasal inhaler  Commonly known as:   NASACORT  Place 2 sprays into the nose daily.

## 2014-03-30 NOTE — Care Management Note (Addendum)
    Page 1 of 1   03/30/2014     5:14:48 PM CARE MANAGEMENT NOTE 03/30/2014  Patient:  Valerie Gay, Valerie Gay   Account Number:  [id number]  Date Initiated:  03/28/2014  Documentation initiated by:  [facility]  Subjective/Objective Assessment:   COPD, PNA     Action/Plan:   waiting final recommendation  pt eval - rec out pt pt.   Anticipated DC Date:  03/30/2014   Anticipated DC Plan:  HOME/SELF CARE      DC Planning Services  CM consult      PAC Choice  DURABLE MEDICAL EQUIPMENT   Choice offered to / List presented to:  C-1 Patient   DME arranged  OTHER - SEE COMMENT  OXYGEN      DME agency  [facility]        Status of service:  Completed, signed off Medicare Important Message given?  YES (If response is "NO", the following Medicare IM given date fields will be blank) Date Medicare IM given:  03/27/2014 Date Additional Medicare IM given:  03/30/2014  Discharge Disposition:  HOME/SELF CARE  Per UR Regulation:  Reviewed for med. necessity/level of care/duration of stay  If discussed at [place] of Stay Meetings, dates discussed:    Comments:  03/30/14 [place], BSN [phone] patient for dc today, outpt physical therapy set up for patient, also oxygen and rollator brought to patient's room.  03/28/2014 1520 UR completed. Jonnie Finner RN CCM Case Mgmt phone [phone]

## 2014-04-03 LAB — CULTURE, BLOOD (ROUTINE X 2)
Culture: NO GROWTH
Culture: NO GROWTH

## 2014-04-04 ENCOUNTER — Encounter: Payer: Self-pay | Admitting: Internal Medicine

## 2014-04-04 ENCOUNTER — Ambulatory Visit (INDEPENDENT_AMBULATORY_CARE_PROVIDER_SITE_OTHER): Payer: Medicare Other | Admitting: Internal Medicine

## 2014-04-04 VITALS — BP 122/76 | HR 86 | Ht 64.0 in | Wt 151.8 lb

## 2014-04-04 DIAGNOSIS — F17200 Nicotine dependence, unspecified, uncomplicated: Secondary | ICD-10-CM | POA: Diagnosis not present

## 2014-04-04 DIAGNOSIS — J441 Chronic obstructive pulmonary disease with (acute) exacerbation: Secondary | ICD-10-CM

## 2014-04-04 DIAGNOSIS — J438 Other emphysema: Secondary | ICD-10-CM | POA: Diagnosis not present

## 2014-04-04 DIAGNOSIS — J189 Pneumonia, unspecified organism: Secondary | ICD-10-CM

## 2014-04-04 DIAGNOSIS — J449 Chronic obstructive pulmonary disease, unspecified: Secondary | ICD-10-CM

## 2014-04-04 NOTE — Patient Instructions (Addendum)
#  COPD  - please have full PFT in 4 weeks  - continue spiriva daily; we will ensure refill  - continue oxygen at night and during day for any exertion above 100 yards - referred to rehab for emphysema  - at fu do CAT Score, and check alpha 1  #smoking - congrats, stay in remission  #Right lower lobe pneumonia  - do CXR 2 view in 4 weeks - depending on results needs med calendar

## 2014-04-04 NOTE — Assessment & Plan Note (Signed)
Encouraged continued remision

## 2014-04-04 NOTE — Assessment & Plan Note (Signed)
Need to establish severity; currently desats with exertion but not at rest but is in aftermath of RLL pnumonia. Once she settles from pjneumonia, will do PFT in 4 weeks and see me/NP for review and adjustment of meds. Will need CAT Score at fu. Will need alpha 1 at fu. REfer rehab now for emphysema

## 2014-04-04 NOTE — Assessment & Plan Note (Signed)
Resolved. COmplete levaquin and prednisone as directed by hospitalist Taught to recognize AECOPD and call us promptly for any future episodes

## 2014-04-04 NOTE — Progress Notes (Signed)
Subjective:    Patient ID: Valerie Gay, female    DOB: 08-31-1941, 73 y.o.   MRN: [id number] PCP No PCP Per Patient  HPI   IOV  04/04/2014  Chief Complaint  Patient presents with  . Follow-up    [facility]d/[facility] from [facility]. Pt states she went home with O2  but only wears it at night. C/o mild SOB with daily activities. Denies CP and cough.     IOV 04/04/2014  Valerie Gay has no PCP or pulmonologist. Has  reports that she quit smoking 8 days ago. Her smoking use included Cigarettes. She has a 30 pack-year smoking history. She does not have any smokeless tobacco history on file.  She presents for first time pulmonary office visit with her family her son and daughter-in-law. She reports a diagnosis of COPD not otherwise specified in 2009 when she was admitted for presumed COPD exacerbation. After that has been going at baseline health with mild chronic dyspnea on exertion with mild cough. At baseline she would mow the yard, climb a flight of stairs and do her ADLs with just some limitations. Then on 03/26/2014 she was mowing the yard and was exposed to pollen. The following day she was admitted for right lower lobe pneumonia and treated by the hospitalist team as community-acquired pneumonia associated with COPD exacerbation and acute respiratory failure. She was discharged on 03/30/2014 with 24 hours oxygen therapy and discharge prednisone and Levaquin. Was advised to quit smoking.  Today she reports on 04/04/2014 that his walking is in remission and she has returned to baseline health with just mild baseline exertional dyspnea and mild chronic cough. She is on Spiriva. She she's still on  Levaquin and prednisone taper the ending today and in the next few days respectively. Her main concern is about continued oxygen use.  Walkking desat test 185 feet x 3 laps: desaturated at 2 laps to 87% with peak hr 117     Past Medical History  Diagnosis Date  . Diverticulosis   . Colon polyps    2010   . COPD (chronic obstructive pulmonary disease)      No family history on file.   History   Social History  . Marital Status: Divorced    Spouse Name: N/A    Number of Children: N/A  . Years of Education: N/A   Occupational History  . retired    Social History Main Topics  . Smoking status: Former Smoker -- 0.50 packs/day for 60 years    Types: Cigarettes    Quit date: 03/27/2014  . Smokeless tobacco: Not on file  . Alcohol Use: No  . Drug Use: Not on file  . Sexual Activity: Not on file   Other Topics Concern  . Not on file   Social History Narrative  . No narrative on file     No Known Allergies   Outpatient Prescriptions Prior to Visit  Medication Sig Dispense Refill  . albuterol (PROVENTIL HFA;VENTOLIN HFA) 108 (90 BASE) MCG/ACT inhaler Inhale 2 puffs into the lungs every 6 (six) hours as needed for wheezing or shortness of breath.  1 Inhaler  2  . albuterol (PROVENTIL) (2.5 MG/3ML) 0.083% nebulizer solution Take 2.5 mg by nebulization every 8 (eight) hours as needed for wheezing or shortness of breath.      [name] aspirin 81 MG tablet Take 81-162 mg by mouth daily.      [name] ibuprofen (ADVIL,MOTRIN) 200 MG tablet Take 200 mg by mouth every  6 (six) hours as needed for moderate pain.      [name] levofloxacin (LEVAQUIN) 750 MG tablet Take 1 tablet (750 mg total) by mouth daily.  5 tablet  0  . predniSONE (STERAPRED UNI-PAK) 10 MG tablet Take 6-5-4-3-2-1 tablet PO daily  21 tablet  0  . tiotropium (SPIRIVA HANDIHALER) 18 MCG inhalation capsule Place 1 capsule (18 mcg total) into inhaler and inhale daily.  30 capsule  1  . triamcinolone (NASACORT) 55 MCG/ACT AERO nasal inhaler Place 2 sprays into the nose daily.  1 Inhaler  12  . budesonide-formoterol (SYMBICORT) 160-4.5 MCG/ACT inhaler Inhale 2 puffs into the lungs 2 (two) times daily.      [name] dextromethorphan-guaiFENesin (MUCINEX DM) 30-600 MG per 12 hr tablet Take 2 tablets by mouth 2 (two) times daily.  30 tablet  0   No  facility-administered medications prior to visit.      Review of Systems  Constitutional: Negative for fever and unexpected weight change.  HENT: Positive for congestion. Negative for dental problem, ear pain, nosebleeds, postnasal drip, rhinorrhea, sinus pressure, sneezing, sore throat and trouble swallowing.   Eyes: Negative for redness and itching.  Respiratory: Positive for shortness of breath. Negative for cough, chest tightness and wheezing.   Cardiovascular: Negative for palpitations and leg swelling.  Gastrointestinal: Negative for nausea and vomiting.  Genitourinary: Negative for dysuria.  Musculoskeletal: Negative for joint swelling.  Skin: Negative for rash.  Neurological: Negative for headaches.  Hematological: Does not bruise/bleed easily.  Psychiatric/Behavioral: Negative for dysphoric mood. The patient is not nervous/anxious.        Objective:   Physical Exam  Vitals reviewed. Constitutional: She is oriented to person, place, and time. She appears well-developed and well-nourished. No distress.  HENT:  Head: Normocephalic and atraumatic.  Right Ear: External ear normal.  Left Ear: External ear normal.  Mouth/Throat: Oropharynx is clear and moist. No oropharyngeal exudate.  Eyes: Conjunctivae and EOM are normal. Pupils are equal, round, and reactive to light. Right eye exhibits no discharge. Left eye exhibits no discharge. No scleral icterus.  Neck: Normal range of motion. Neck supple. No JVD present. No tracheal deviation present. No thyromegaly present.  Cardiovascular: Normal rate, regular rhythm, normal heart sounds and intact distal pulses.  Exam reveals no gallop and no friction rub.   No murmur heard. Pulmonary/Chest: Effort normal and breath sounds normal. No respiratory distress. She has no wheezes. She has no rales. She exhibits no tenderness.  barrell chest mild Overall diminished air entry  Abdominal: Soft. Bowel sounds are normal. She exhibits no  distension and no mass. There is no tenderness. There is no rebound and no guarding.  Musculoskeletal: Normal range of motion. She exhibits no edema and no tenderness.  Lymphadenopathy:    She has no cervical adenopathy.  Neurological: She is alert and oriented to person, place, and time. She has normal reflexes. No cranial nerve deficit. She exhibits normal muscle tone. Coordination normal.  Skin: Skin is warm and dry. No rash noted. She is not diaphoretic. No erythema. No pallor.  Psychiatric: She has a normal mood and affect. Her behavior is normal. Judgment and thought content normal.          Assessment & Plan:

## 2014-04-04 NOTE — Assessment & Plan Note (Signed)
Clinically resolved. Finish  levaquin today. REpeat CXR in 4 weeks and consider CT chest depending on the results

## 2014-04-13 ENCOUNTER — Telehealth (HOSPITAL_COMMUNITY): Payer: Self-pay

## 2014-04-13 ENCOUNTER — Ambulatory Visit: Payer: Medicare Other | Attending: Internal Medicine | Admitting: Physical Therapy

## 2014-04-13 DIAGNOSIS — R42 Dizziness and giddiness: Secondary | ICD-10-CM | POA: Diagnosis not present

## 2014-04-13 DIAGNOSIS — IMO0001 Reserved for inherently not codable concepts without codable children: Secondary | ICD-10-CM | POA: Diagnosis not present

## 2014-04-13 DIAGNOSIS — J4489 Other specified chronic obstructive pulmonary disease: Secondary | ICD-10-CM | POA: Insufficient documentation

## 2014-04-13 DIAGNOSIS — M6281 Muscle weakness (generalized): Secondary | ICD-10-CM | POA: Diagnosis not present

## 2014-04-13 DIAGNOSIS — J449 Chronic obstructive pulmonary disease, unspecified: Secondary | ICD-10-CM | POA: Insufficient documentation

## 2014-04-13 NOTE — Telephone Encounter (Signed)
I have called and left a message with Jamicia to inquire about participation in Pulmonary Rehab. Will send letter in mail and follow up.  

## 2014-04-18 ENCOUNTER — Ambulatory Visit: Payer: Medicare Other | Attending: Internal Medicine | Admitting: Rehabilitative and Restorative Service Providers"

## 2014-04-18 DIAGNOSIS — M6281 Muscle weakness (generalized): Secondary | ICD-10-CM | POA: Diagnosis not present

## 2014-04-18 DIAGNOSIS — IMO0001 Reserved for inherently not codable concepts without codable children: Secondary | ICD-10-CM | POA: Diagnosis not present

## 2014-04-18 DIAGNOSIS — R42 Dizziness and giddiness: Secondary | ICD-10-CM | POA: Diagnosis not present

## 2014-04-18 DIAGNOSIS — J449 Chronic obstructive pulmonary disease, unspecified: Secondary | ICD-10-CM | POA: Insufficient documentation

## 2014-04-18 DIAGNOSIS — J4489 Other specified chronic obstructive pulmonary disease: Secondary | ICD-10-CM | POA: Insufficient documentation

## 2014-04-23 ENCOUNTER — Encounter: Payer: Medicare Other | Admitting: Rehabilitative and Restorative Service Providers"

## 2014-04-25 ENCOUNTER — Encounter: Payer: Medicare Other | Admitting: Rehabilitative and Restorative Service Providers"

## 2014-04-30 ENCOUNTER — Encounter: Payer: Medicare Other | Admitting: Rehabilitative and Restorative Service Providers"

## 2014-05-02 ENCOUNTER — Encounter: Payer: Medicare Other | Admitting: Rehabilitative and Restorative Service Providers"

## 2014-05-07 ENCOUNTER — Ambulatory Visit (INDEPENDENT_AMBULATORY_CARE_PROVIDER_SITE_OTHER)
Admission: RE | Admit: 2014-05-07 | Discharge: 2014-05-07 | Disposition: A | Discharge status: Home / Self Care | Payer: Medicare Other | Source: Ambulatory Visit | Attending: Internal Medicine | Admitting: Internal Medicine

## 2014-05-07 ENCOUNTER — Ambulatory Visit (INDEPENDENT_AMBULATORY_CARE_PROVIDER_SITE_OTHER): Payer: Medicare Other | Admitting: Internal Medicine

## 2014-05-07 ENCOUNTER — Encounter: Payer: Self-pay | Admitting: Internal Medicine

## 2014-05-07 ENCOUNTER — Other Ambulatory Visit (INDEPENDENT_AMBULATORY_CARE_PROVIDER_SITE_OTHER): Payer: Medicare Other

## 2014-05-07 VITALS — BP 134/70 | HR 82 | Ht 64.0 in | Wt 150.0 lb

## 2014-05-07 DIAGNOSIS — J449 Chronic obstructive pulmonary disease, unspecified: Secondary | ICD-10-CM

## 2014-05-07 DIAGNOSIS — F17200 Nicotine dependence, unspecified, uncomplicated: Secondary | ICD-10-CM | POA: Diagnosis not present

## 2014-05-07 DIAGNOSIS — J189 Pneumonia, unspecified organism: Secondary | ICD-10-CM

## 2014-05-07 LAB — PULMONARY FUNCTION TEST
FEF 25-75 PRE: 0.41 L/s
FEF 25-75 Post: 0.6 L/sec
FEF2575-%Change-Post: 46 %
FEF2575-%PRED-POST: 36 %
FEF2575-%PRED-PRE: 24 %
FEV1-%Change-Post: 12 %
FEV1-%PRED-PRE: 47 %
FEV1-%Pred-Post: 52 %
FEV1-PRE: 0.99 L
FEV1-Post: 1.11 L
FEV1FVC-%Change-Post: -6 %
FEV1FVC-%PRED-PRE: 66 %
FEV6-%CHANGE-POST: 20 %
FEV6-%PRED-POST: 85 %
FEV6-%Pred-Pre: 70 %
FEV6-Post: 2.27 L
FEV6-Pre: 1.89 L
FEV6FVC-%Change-Post: 0 %
FEV6FVC-%Pred-Post: 100 %
FEV6FVC-%Pred-Pre: 99 %
FVC-%Change-Post: 19 %
FVC-%PRED-POST: 84 %
FVC-%Pred-Pre: 71 %
FVC-POST: 2.39 L
FVC-Pre: 2 L
Post FEV1/FVC ratio: 47 %
Post FEV6/FVC ratio: 95 %
Pre FEV1/FVC ratio: 50 %
Pre FEV6/FVC Ratio: 95 %
RV % pred: 138 %
RV: 3.17 L
TLC % pred: 101 %
TLC: 5.12 L

## 2014-05-07 LAB — CBC WITH DIFFERENTIAL/PLATELET
BASOS ABS: 0.1 10*3/uL (ref 0.0–0.1)
BASOS PCT: 1.2 % (ref 0.0–3.0)
Eosinophils Absolute: 0.1 10*3/uL (ref 0.0–0.7)
Eosinophils Relative: 0.7 % (ref 0.0–5.0)
HEMATOCRIT: 38.3 % (ref 36.0–46.0)
Hemoglobin: 12.7 g/dL (ref 12.0–15.0)
LYMPHS ABS: 2.4 10*3/uL (ref 0.7–4.0)
Lymphocytes Relative: 23.9 % (ref 12.0–46.0)
MCHC: 33.2 g/dL (ref 30.0–36.0)
MCV: 95.6 fl (ref 78.0–100.0)
MONO ABS: 0.6 10*3/uL (ref 0.1–1.0)
Monocytes Relative: 5.9 % (ref 3.0–12.0)
NEUTROS ABS: 7 10*3/uL (ref 1.4–7.7)
Neutrophils Relative %: 68.3 % (ref 43.0–77.0)
Platelets: 443 10*3/uL — ABNORMAL HIGH (ref 150.0–400.0)
RBC: 4.01 Mil/uL (ref 3.87–5.11)
RDW: 14.6 % (ref 11.5–15.5)
WBC: 10.2 10*3/uL (ref 4.0–10.5)

## 2014-05-07 NOTE — Progress Notes (Signed)
PFT done Today.

## 2014-05-07 NOTE — Patient Instructions (Addendum)
#  COPD - current stable  - you have moderate copd - half of normal lung function - continue spiriva and symbicort daily  - continue oxygen at night and during day for any exertion above 100 yards - glad you are starting pulmonary rehab for emphysema  -do  alpha 1 check today for genetic cause of copd  = check CBCD to see if high eosinophils playing a role in flare ups  #smoking - to bad you smoking again; please quit or lungs will get worse  #Right lower lobe pneumonia May 2015  - do CXR 2 view today   #Eye issue Will d/w Dr Starling Manns if safe for you to take spiriva and symbicort  #Followup  3 months or sooner if needed

## 2014-05-07 NOTE — Progress Notes (Signed)
Subjective:    Patient ID: Valerie Gay, female    DOB: 12-14-1940, 73 y.o.   MRN: [id number]  HPI PCP No PCP Per Patient  HPI   IOV  04/04/2014  Chief Complaint  Patient presents with  . Follow-up    HFU-d/c from [facility]. Pt states she went home with O2  but only wears it at night. C/o mild SOB with daily activities. Denies CP and cough.     IOV 04/04/2014  Valerie Gay has no PCP or pulmonologist. Has  reports that she quit smoking 8 days ago. Her smoking use included Cigarettes. She has a 30 pack-year smoking history. She does not have any smokeless tobacco history on file.  She presents for first time pulmonary office visit with her family her son and daughter-in-law. She reports a diagnosis of COPD not otherwise specified in 2009 when she was admitted for presumed COPD exacerbation. After that has been going at baseline health with mild chronic dyspnea on exertion with mild cough. At baseline she would mow the yard, climb a flight of stairs and do her ADLs with just some limitations. Then on 03/26/2014 she was mowing the yard and was exposed to pollen. The following day she was admitted for right lower lobe pneumonia and treated by the hospitalist team as community-acquired pneumonia associated with COPD exacerbation and acute respiratory failure. She was discharged on 03/30/2014 with 24 hours oxygen therapy and discharge prednisone and Levaquin. Was advised to quit smoking.  Today she reports on 04/04/2014 that his walking is in remission and she has returned to baseline health with just mild baseline exertional dyspnea and mild chronic cough. She is on Spiriva. She she's still on  Levaquin and prednisone taper the ending today and in the next few days respectively. Her main concern is about continued oxygen use.  Walkking desat test 185 feet x 3 laps: desaturated at 2 laps to 87% with peak hr 117   #COPD  - please have full PFT in 4 weeks  - continue spiriva daily; we  will ensure refill  - continue oxygen at night and during day for any exertion above 100 yards - referred to rehab for emphysema  - at fu do CAT Score, and check alpha 1  #smoking - congrats, stay in remission  #Right lower lobe pneumonia  - do CXR 2 view in 4 weeks - depending on results needs med calendar  OV 05/07/2014  Chief Complaint  Patient presents with  . Follow-up    Pt with pft results from today. Pt states breathing has improved. C/o dyspnea when bending over and when carrying things. Denies cough and CP.    COPD: Currently stable.  Unclear if has high eos making her prone for AECOPD. She is on Spiriva and Symbicort. COPD CAT  is 11 and reflect only minimal symptoms. Pulmonary function test shows gold stage II COPD with a postbronchodilator FEV1 is 1.1 L/52%. FVC of 2.4L/84%. Ratio 47. Bronchodilator response is 12%.  SHe is compliant with spiriva but not so much withsymbicort and o2. 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 She is s/p cataract surgery and has family hx of glaucoma. She stil has dyspnea with bending. She will start rehab soon . She is interested in research trials as long as no major change to her MDIs.   Smoking: has relapsed. Advised to quit  RLL pneumonia: May 2015. Currently well. Will have cxr today   PASt: Sh wants  me to touch base with her eye doc about safety of her mdi  Review of Systems  Constitutional: Negative for fever and unexpected weight change.  HENT: Negative for congestion, dental problem, ear pain, nosebleeds, postnasal drip, rhinorrhea, sinus pressure, sneezing, sore throat and trouble swallowing.   Eyes: Negative for redness and itching.  Respiratory: Positive for shortness of breath. Negative for cough, chest tightness and wheezing.   Cardiovascular: Negative for palpitations and leg swelling.  Gastrointestinal: Negative for nausea and vomiting.  Genitourinary: Negative for dysuria.  Musculoskeletal:  Negative for joint swelling.  Skin: Negative for rash.  Neurological: Negative for headaches.  Hematological: Does not bruise/bleed easily.  Psychiatric/Behavioral: Negative for dysphoric mood. The patient is not nervous/anxious.        Objective:   Physical Exam  Filed Vitals:   05/07/14 1200  BP: 134/70  Pulse: 82  Height: 5\' 4"  (1.626 m)  Weight: 150 lb (68.04 kg)  SpO2: 96%    Vitals reviewed. Constitutional: She is oriented to person, place, and time. She appears well-developed and well-nourished. No distress.  HENT:  Head: Normocephalic and atraumatic.  Right Ear: External ear normal.  Left Ear: External ear normal.  Mouth/Throat: Oropharynx is clear and moist. No oropharyngeal exudate.  Eyes: Conjunctivae and EOM are normal. Pupils are equal, round, and reactive to light. Right eye exhibits no discharge. Left eye exhibits no discharge. No scleral icterus.  Neck: Normal range of motion. Neck supple. No JVD present. No tracheal deviation present. No thyromegaly present.  Cardiovascular: Normal rate, regular rhythm, normal heart sounds and intact distal pulses.  Exam reveals no gallop and no friction rub.   No murmur heard. Pulmonary/Chest: Effort normal and breath sounds normal. No respiratory distress. She has no wheezes. She has no rales. She exhibits no tenderness.  barrell chest mild Overall diminished air entry  Abdominal: Soft. Bowel sounds are normal. She exhibits no distension and no mass. There is no tenderness. There is no rebound and no guarding.  Musculoskeletal: Normal range of motion. She exhibits no edema and no tenderness.  Lymphadenopathy:    She has no cervical adenopathy.  Neurological: She is alert and oriented to person, place, and time. She has normal reflexes. No cranial nerve deficit. She exhibits normal muscle tone. Coordination normal.  Skin: Skin is warm and dry. No rash noted. She is not diaphoretic. No erythema. No pallor.  Psychiatric: She  has a normal mood and affect. Her behavior is normal. Judgment and thought content normal.          Assessment & Plan:  #COPD - current stable  - you have moderate copd - half of normal lung function - continue spiriva and symbicort daily  - continue oxygen at night and during day for any exertion above 100 yards - glad you are starting pulmonary rehab for emphysema  -do  alpha 1 check today for genetic cause of copd  = check CBCD to see if high eosinophils playing a role in flare ups  #smoking - to bad you smoking again; please quit or lungs will get worse  #Right lower lobe pneumonia May 2015  - do CXR 2 view today   #Eye issue Will d/w Dr Starling Manns if safe for you to take spiriva and symbicort  #Followup  3 months or sooner if needed

## 2014-05-08 ENCOUNTER — Telehealth: Payer: Self-pay | Admitting: Internal Medicine

## 2014-05-08 DIAGNOSIS — J189 Pneumonia, unspecified organism: Secondary | ICD-10-CM

## 2014-05-08 DIAGNOSIS — J984 Other disorders of lung: Secondary | ICD-10-CM

## 2014-05-08 DIAGNOSIS — H353290 Exudative age-related macular degeneration, unspecified eye, stage unspecified: Secondary | ICD-10-CM | POA: Diagnosis not present

## 2014-05-08 NOTE — Telephone Encounter (Signed)
Let patient know that cxr is not normal and not sure if pneumonina has fully healed And she needs to have CT ches wo contrast and get into see TP; first available. I have ordered CT chest for Valerie Gay   Thanks  Dr. Brand Males, M.D., [facility].C.P Pulmonary and Critical Care Medicine Staff Physician [place] [facility] Pager: [phone], If no answer or between  15:00h - 7:00h: call [phone]  05/08/2014 8:31 PM

## 2014-05-09 NOTE — Telephone Encounter (Signed)
Called and spoke to pt regarding results and recs per MR. Informed pt that she also has the CT scheduled on 05/15/14 at 11am at the [facility] on [address]. A f/u appt was made with TP on 7/10. Pt verbalized understanding and confirmed appt time and date. Pt denied any further questions or concerns at this time.

## 2014-05-13 ENCOUNTER — Encounter: Payer: Self-pay | Admitting: Internal Medicine

## 2014-05-13 NOTE — Assessment & Plan Note (Signed)
#  smoking - to bad you smoking again; please quit or lungs will get worse

## 2014-05-13 NOTE — Assessment & Plan Note (Signed)
#  COPD - current stable  - you have moderate copd - half of normal lung function - continue spiriva and symbicort daily  - continue oxygen at night and during day for any exertion above 100 yards - glad you are starting pulmonary rehab for emphysema  -do  alpha 1 check today for genetic cause of copd  = check CBCD to see if high eosinophils playing a role in flare ups   #Eye issue Will d/w Dr Starling Manns if safe for you to take spiriva and symbicort  #Followup  3 months or sooner if needed

## 2014-05-13 NOTE — Assessment & Plan Note (Signed)
 #  Right lower lobe pneumonia May 2015. Clinically better now but needs imaging repeat to ensur resolution. Will do do CXR 2 view today

## 2014-05-14 ENCOUNTER — Encounter: Payer: Medicare Other | Admitting: Rehabilitative and Restorative Service Providers"

## 2014-05-14 LAB — ALPHA-1 ANTITRYPSIN PHENOTYPE: A-1 Antitrypsin: 170 mg/dL (ref 83–199)

## 2014-05-15 ENCOUNTER — Ambulatory Visit (INDEPENDENT_AMBULATORY_CARE_PROVIDER_SITE_OTHER)
Admission: RE | Admit: 2014-05-15 | Discharge: 2014-05-15 | Disposition: A | Discharge status: Home / Self Care | Payer: Medicare Other | Source: Ambulatory Visit | Attending: Internal Medicine | Admitting: Internal Medicine

## 2014-05-15 DIAGNOSIS — J189 Pneumonia, unspecified organism: Secondary | ICD-10-CM

## 2014-05-15 DIAGNOSIS — J984 Other disorders of lung: Secondary | ICD-10-CM | POA: Diagnosis not present

## 2014-05-15 DIAGNOSIS — J438 Other emphysema: Secondary | ICD-10-CM | POA: Diagnosis not present

## 2014-05-16 ENCOUNTER — Encounter: Payer: Medicare Other | Admitting: Rehabilitative and Restorative Service Providers"

## 2014-05-20 ENCOUNTER — Telehealth: Payer: Self-pay | Admitting: Internal Medicine

## 2014-05-20 NOTE — Telephone Encounter (Signed)
Let [name] know that alpha 1AT is MM gene which is normal  Dr. Brand Males, M.D., [facility].C.P Pulmonary and Critical Care Medicine Staff Physician [place] [facility] Pager: [phone], If no answer or between  15:00h - 7:00h: call [phone]  05/20/2014 9:01 PM

## 2014-05-21 ENCOUNTER — Encounter (HOSPITAL_COMMUNITY)
Admission: RE | Admit: 2014-05-21 | Discharge: 2014-05-21 | Disposition: A | Discharge status: Home / Self Care | Payer: Medicare Other | Source: Ambulatory Visit | Attending: Internal Medicine | Admitting: Internal Medicine

## 2014-05-21 VITALS — BP 100/60 | HR 66

## 2014-05-21 DIAGNOSIS — J441 Chronic obstructive pulmonary disease with (acute) exacerbation: Secondary | ICD-10-CM | POA: Insufficient documentation

## 2014-05-21 DIAGNOSIS — J9600 Acute respiratory failure, unspecified whether with hypoxia or hypercapnia: Secondary | ICD-10-CM | POA: Insufficient documentation

## 2014-05-21 DIAGNOSIS — F17200 Nicotine dependence, unspecified, uncomplicated: Secondary | ICD-10-CM | POA: Diagnosis not present

## 2014-05-21 DIAGNOSIS — J438 Other emphysema: Secondary | ICD-10-CM

## 2014-05-21 NOTE — Telephone Encounter (Signed)
Called and spoke to pt regarding results per MR. Pt verbalized understanding and denied any questions or concerns at this time.

## 2014-05-21 NOTE — Progress Notes (Signed)
Valerie Gay 73 y.o. female Pulmonary Rehab Orientation Note Patient arrived today in [facility] and [facility] Rehab for orientation to Pulmonary Rehab. She was transported from [employer] via wheel chair. She does not carry portable oxygen. Per pt, she uses oxygen at night when going to sleep. Color good, skin warm and dry. Patient is oriented to time and place. Patient's medical history and medications reviewed. Heart rate is normal, breath sounds clear to auscultation, no wheezes, rales, or rhonchi, diminished. Grip strength equal, strong. Distal pulses 2+ posterior tibial pulses present. Patient reports she does take medications as prescribed. Patient states she follows a Regular diet. She has recently lost weight without trying. Patient's weight will be monitored closely. Demonstration and practice of PLB using pulse oximeter. Patient able to return demonstration satisfactorily. Safety and hand hygiene in the exercise area reviewed with patient. Patient voices understanding of the information reviewed. Department expectations discussed with patient and achievable goals were set. The patient shows enthusiasm about attending the program and we look forward to working with this nice lady. The patient is scheduled for a 6 min walk test on Thursday, May 24, 2014 @ 3:30pm and to begin exercise on Tuesday, May 29, 2014 in the 1030 class.   Rosebud Poles [phone]

## 2014-05-22 ENCOUNTER — Telehealth: Payer: Self-pay | Admitting: Internal Medicine

## 2014-05-22 DIAGNOSIS — J439 Emphysema, unspecified: Secondary | ICD-10-CM

## 2014-05-22 NOTE — Telephone Encounter (Signed)
Let her know Valerie Gay ill give over CT chest results on 05/25/14. CT 05/15/14 shows emphysema + scarring frmo the pneumonia. She will need repeat ct chest in 6 months but at NP visit she will get all details.  Dr. Brand Males, M.D., [facility].C.P Pulmonary and Critical Care Medicine Staff Physician [place] [facility] Pager: [phone], If no answer or between  15:00h - 7:00h: call [phone]  05/22/2014 9:38 PM

## 2014-05-23 NOTE — Telephone Encounter (Signed)
Called and spoke to pt. Informed pt of results and recs per MR. Pt aware of appt time and date. F/u CT in 6 months. CT order placed for Jan 2016.

## 2014-05-24 ENCOUNTER — Encounter (HOSPITAL_COMMUNITY)
Admission: RE | Admit: 2014-05-24 | Discharge: 2014-05-24 | Disposition: A | Discharge status: Home / Self Care | Payer: Medicare Other | Source: Ambulatory Visit | Attending: Internal Medicine | Admitting: Internal Medicine

## 2014-05-24 DIAGNOSIS — J441 Chronic obstructive pulmonary disease with (acute) exacerbation: Secondary | ICD-10-CM | POA: Diagnosis not present

## 2014-05-24 NOTE — Progress Notes (Signed)
Valerie Gay completed a Six-Minute Walk Test on 05/24/14 . Valerie Gay walked 1,014 feet with 0 breaks.  The patient's lowest oxygen saturation was 92%, highest heart rate was 111bpm , and highest blood pressure was 124/60. The patient was on room air. Valerie Gay stated that leg fatigue hindered their walk test.

## 2014-05-25 ENCOUNTER — Encounter: Payer: Self-pay | Admitting: Adult Health

## 2014-05-25 ENCOUNTER — Ambulatory Visit (INDEPENDENT_AMBULATORY_CARE_PROVIDER_SITE_OTHER): Payer: Medicare Other | Admitting: Adult Health

## 2014-05-25 VITALS — BP 106/62 | HR 77

## 2014-05-25 DIAGNOSIS — J449 Chronic obstructive pulmonary disease, unspecified: Secondary | ICD-10-CM

## 2014-05-25 DIAGNOSIS — R9389 Abnormal findings on diagnostic imaging of other specified body structures: Secondary | ICD-10-CM | POA: Diagnosis not present

## 2014-05-25 DIAGNOSIS — J4489 Other specified chronic obstructive pulmonary disease: Secondary | ICD-10-CM

## 2014-05-25 MED ORDER — TIOTROPIUM BROMIDE MONOHYDRATE 18 MCG IN CAPS: 18.0000 ug | ORAL_CAPSULE | Freq: Every day | RESPIRATORY_TRACT | Status: DC

## 2014-05-25 MED ORDER — ALBUTEROL SULFATE (2.5 MG/3ML) 0.083% IN NEBU: 2.5000 mg | INHALATION_SOLUTION | Freq: Three times a day (TID) | RESPIRATORY_TRACT | Status: DC | PRN

## 2014-05-25 MED ORDER — BUDESONIDE-FORMOTEROL FUMARATE 160-4.5 MCG/ACT IN AERO: 2.0000 | INHALATION_SPRAY | Freq: Two times a day (BID) | RESPIRATORY_TRACT | Status: DC

## 2014-05-25 NOTE — Patient Instructions (Signed)
Continue on current regimen  Most important goal is to quit smoking  You will need a repeat CT chest in 10/2014 .  Follow up Dr. Chase Caller in 3 months and As needed

## 2014-05-28 DIAGNOSIS — R9389 Abnormal findings on diagnostic imaging of other specified body structures: Secondary | ICD-10-CM | POA: Insufficient documentation

## 2014-05-28 NOTE — Progress Notes (Signed)
Subjective:    Patient ID: Valerie Gay, female    DOB: Jan 20, 1941, 73 y.o.   MRN: [id number]  HPI PCP No PCP Per Patient  HPI   IOV  04/04/2014  Chief Complaint  Patient presents with  . Follow-up    HFU-d/c from [facility]. Pt states she went home with O2  but only wears it at night. C/o mild SOB with daily activities. Denies CP and cough.     IOV 04/04/2014  Valerie Gay has no PCP or pulmonologist. Has  reports that she quit smoking 8 days ago. Her smoking use included Cigarettes. She has a 30 pack-year smoking history. She does not have any smokeless tobacco history on file.  She presents for first time pulmonary office visit with her family her son and daughter-in-law. She reports a diagnosis of COPD not otherwise specified in 2009 when she was admitted for presumed COPD exacerbation. After that has been going at baseline health with mild chronic dyspnea on exertion with mild cough. At baseline she would mow the yard, climb a flight of stairs and do her ADLs with just some limitations. Then on 03/26/2014 she was mowing the yard and was exposed to pollen. The following day she was admitted for right lower lobe pneumonia and treated by the hospitalist team as community-acquired pneumonia associated with COPD exacerbation and acute respiratory failure. She was discharged on 03/30/2014 with 24 hours oxygen therapy and discharge prednisone and Levaquin. Was advised to quit smoking.  Today she reports on 04/04/2014 that his walking is in remission and she has returned to baseline health with just mild baseline exertional dyspnea and mild chronic cough. She is on Spiriva. She she's still on  Levaquin and prednisone taper the ending today and in the next few days respectively. Her main concern is about continued oxygen use.  Walkking desat test 185 feet x 3 laps: desaturated at 2 laps to 87% with peak hr 117   #COPD  - please have full PFT in 4 weeks  - continue spiriva daily; we  will ensure refill  - continue oxygen at night and during day for any exertion above 100 yards - referred to rehab for emphysema  - at fu do CAT Score, and check alpha 1  #smoking - congrats, stay in remission  #Right lower lobe pneumonia  - do CXR 2 view in 4 weeks - depending on results needs med calendar  OV 05/07/2014  Chief Complaint  Patient presents with  . Follow-up    Pt with pft results from today. Pt states breathing has improved. C/o dyspnea when bending over and when carrying things. Denies cough and CP.    COPD: Currently stable.  Unclear if has high eos making her prone for AECOPD. She is on Spiriva and Symbicort. COPD CAT  is 11 and reflect only minimal symptoms. Pulmonary function test shows gold stage II COPD with a postbronchodilator FEV1 is 1.1 L/52%. FVC of 2.4L/84%. Ratio 47. Bronchodilator response is 12%.  SHe is compliant with spiriva but not so much withsymbicort and o2. 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 She is s/p cataract surgery and has family hx of glaucoma. She stil has dyspnea with bending. She will start rehab soon . She is interested in research trials as long as no major change to her MDIs.   Smoking: has relapsed. Advised to quit  RLL pneumonia: May 2015. Currently well. Will have cxr today   PASt: Sh wants  me to touch base with her eye doc about safety of her mdi  05/25/14 Follow up COPD  Returns for follow up for CT  Chest. Breathing has improved.  Does have SOB after waking up in the mornings with slight chest tightness. Resolves after getting up and going .   No wheezing, CP, or cough, hemoptysis, edema or orthopnea.  Continues to smoke , discussed smoking cessaiton.   CT chest on 05/15/14 showed moderate to severe COPD  Linear densities in RML [place] . No cavitation. Appears to be scarring  But will repeat CT chest in 6 months.  Results reviewed with pt.     Review of Systems  Constitutional: Negative  for fever and unexpected weight change.  HENT: Negative for congestion, dental problem, ear pain, nosebleeds, postnasal drip, rhinorrhea, sinus pressure, sneezing, sore throat and trouble swallowing.   Eyes: Negative for redness and itching.  Respiratory: Positive for shortness of breath. Negative for cough, chest tightness and wheezing.   Cardiovascular: Negative for palpitations and leg swelling.  Gastrointestinal: Negative for nausea and vomiting.  Genitourinary: Negative for dysuria.  Musculoskeletal: Negative for joint swelling.  Skin: Negative for rash.  Neurological: Negative for headaches.  Hematological: Does not bruise/bleed easily.  Psychiatric/Behavioral: Negative for dysphoric mood. The patient is not nervous/anxious.        Objective:   Physical Exam GEN: A/Ox3; pleasant , NAD, elderly   HEENT:  [place]/AT,  EACs-clear, TMs-wnl, NOSE-clear, THROAT-clear, no lesions, no postnasal drip or exudate noted.   NECK:  Supple w/ fair ROM; no JVD; normal carotid impulses w/o bruits; no thyromegaly or nodules palpated; no lymphadenopathy.  RESP  Decreased BS in bases w/ no wheezing   CARD:  RRR, no m/r/g  , no peripheral edema, pulses intact, no cyanosis or clubbing.  GI:   Soft & nt; nml bowel sounds; no organomegaly or masses detected.  Musco: Warm bil, no deformities or joint swelling noted.   Neuro: alert, no focal deficits noted.    Skin: Warm, no lesions or rashes

## 2014-05-28 NOTE — Assessment & Plan Note (Signed)
Compensated without flare Smoking cessation discussed   Plan  Continue on current regimen  Most important goal is to quit smoking  You will need a repeat CT chest in 10/2014 .  Follow up Dr. Chase Caller in 3 months and As needed

## 2014-05-28 NOTE — Assessment & Plan Note (Signed)
CT chest on 05/15/14 showed moderate to severe COPD  Linear densities in RML [place] . No cavitation. Appears to be scarring  But will repeat CT chest in 6 months. ~10/2014

## 2014-05-29 ENCOUNTER — Encounter (HOSPITAL_COMMUNITY)
Admission: RE | Admit: 2014-05-29 | Discharge: 2014-05-29 | Disposition: A | Discharge status: Home / Self Care | Payer: Medicare Other | Source: Ambulatory Visit | Attending: Internal Medicine | Admitting: Internal Medicine

## 2014-05-29 DIAGNOSIS — J441 Chronic obstructive pulmonary disease with (acute) exacerbation: Secondary | ICD-10-CM | POA: Diagnosis not present

## 2014-05-29 NOTE — Progress Notes (Signed)
Today, Valerie Gay exercised at [employer]. Service time was from 1030 to 1230.  The patient exercised for more than 31 minutes performing aerobic, strengthening, and stretching exercises. Oxygen saturation, heart rate, blood pressure, rate of perceived exertion, and shortness of breath were all monitored before, during, and after exercise. [name] presented with no problems at today's exercise session.   There was no workload change during today's exercise session.  Pre-exercise vitals:   Weight kg: 69.0   Liters of O2: ra   SpO2: 96   HR: 94   BP: 112/62   CBG: na  Exercise vitals:   Highest heartrate:  102   Lowest oxygen saturation: 95   Highest blood pressure: 122/70   Liters of 02: ra  Post-exercise vitals:   SpO2: 96   HR: 82   BP: 112/80   Liters of O2: ra   CBG: na  Dr. Brand Males, Medical Director Dr. Aileen Fass is immediately available during today's Pulmonary Rehab session for Valerie Gay on 05/29/2014 at 1030 class time.

## 2014-05-31 ENCOUNTER — Encounter (HOSPITAL_COMMUNITY)
Admission: RE | Admit: 2014-05-31 | Discharge: 2014-05-31 | Disposition: A | Discharge status: Home / Self Care | Payer: Medicare Other | Source: Ambulatory Visit | Attending: Internal Medicine | Admitting: Internal Medicine

## 2014-05-31 ENCOUNTER — Encounter: Payer: Self-pay | Admitting: Gastroenterology

## 2014-05-31 DIAGNOSIS — J441 Chronic obstructive pulmonary disease with (acute) exacerbation: Secondary | ICD-10-CM | POA: Diagnosis not present

## 2014-05-31 NOTE — Progress Notes (Signed)
Today, Valerie Gay exercised at [employer]. [facility]. Service time was from 1030 to 1230.  The patient exercised for more than 31 minutes performing aerobic, strengthening, and stretching exercises. Oxygen saturation, heart rate, blood pressure, rate of perceived exertion, and shortness of breath were all monitored before, during, and after exercise. Valerie Gay presented with no problems at today's exercise session. Valerie Gay also attended an education session on nutrition for the pulmonary patient.  There was no workload change during today's exercise session.  Pre-exercise vitals:   Weight kg: 69.3   Liters of O2: ra   SpO2: 96   HR: 80   BP: 106/58   CBG: na  Exercise vitals:   Highest heartrate:  98   Lowest oxygen saturation: 94   Highest blood pressure: 124/62   Liters of 02: ra  Post-exercise vitals:   SpO2: unable to check (see note)   HR: unable to check (see note)   BP: 96/62   Liters of O2: ra   CBG: na Pt left before checking sats and HR. Will be re-educated about the importance of having vitals checked/assessed by staff prior to leaving.  Dr. Brand Males, Medical Director Dr. Aileen Fass is immediately available during today's Pulmonary Rehab session for [name] on 05/31/2014 at 1030 class time.

## 2014-06-05 ENCOUNTER — Encounter (HOSPITAL_COMMUNITY)
Admission: RE | Admit: 2014-06-05 | Discharge: 2014-06-05 | Disposition: A | Discharge status: Home / Self Care | Payer: Medicare Other | Source: Ambulatory Visit | Attending: Internal Medicine | Admitting: Internal Medicine

## 2014-06-05 DIAGNOSIS — J441 Chronic obstructive pulmonary disease with (acute) exacerbation: Secondary | ICD-10-CM | POA: Diagnosis not present

## 2014-06-05 NOTE — Progress Notes (Signed)
Today, Valerie Gay exercised at [employer]. [facility]. Service time was from 10:30 to 12:15.  The patient exercised for more than 31 minutes performing aerobic, strengthening, and stretching exercises. Oxygen saturation, heart rate, blood pressure, rate of perceived exertion, and shortness of breath were all monitored before, during, and after exercise. Valerie Gay presented with no problems at today's exercise session.   There was no workload change during today's exercise session.  Pre-exercise vitals:   Weight kg: 69.2   Liters of O2: ra   SpO2: 97   HR: 71   BP: 104/60   CBG: na  Exercise vitals:   Highest heartrate:  108   Lowest oxygen saturation: 94   Highest blood pressure: 120/74   Liters of 02: ra  Post-exercise vitals:   SpO2: 96   HR: 76   BP: 114/70   Liters of O2: ra   CBG: na  Dr. Brand Males, Medical Director Dr. Maryland Pink is immediately available during today's Pulmonary Rehab session for Valerie Gay on 06/05/14 at 10:30am class time.

## 2014-06-07 ENCOUNTER — Encounter (HOSPITAL_COMMUNITY)
Admission: RE | Admit: 2014-06-07 | Discharge: 2014-06-07 | Disposition: A | Discharge status: Home / Self Care | Payer: Medicare Other | Source: Ambulatory Visit | Attending: Internal Medicine | Admitting: Internal Medicine

## 2014-06-07 DIAGNOSIS — J441 Chronic obstructive pulmonary disease with (acute) exacerbation: Secondary | ICD-10-CM | POA: Diagnosis not present

## 2014-06-07 NOTE — Progress Notes (Signed)
Today, Valerie Gay exercised at [employer]. [facility]. Service time was from 10:30 to 12:30.  The patient exercised for more than 31 minutes performing aerobic, strengthening, and stretching exercises. Oxygen saturation, heart rate, blood pressure, rate of perceived exertion, and shortness of breath were all monitored before, during, and after exercise. Valerie Gay presented with no problems at today's exercise session. The patient attended Exercise for the Pulmonary Patient education class today.  There was no workload change during today's exercise session.  Pre-exercise vitals:   Weight kg: 69.6   Liters of O2: ra   SpO2: 97   HR: 80   BP: 104/68   CBG: na  Exercise vitals:   Highest heartrate:  118   Lowest oxygen saturation: 94   Highest blood pressure: 124/64   Liters of 02: ra  Post-exercise vitals:   SpO2: 95   HR: 88   BP: 122/60   Liters of O2: ra   CBG: na  Dr. Brand Males, Medical Director [name] is immediately available during today's Pulmonary Rehab session for Valerie Gay on 06/07/14 at 10:30am class time.

## 2014-06-12 ENCOUNTER — Encounter (HOSPITAL_COMMUNITY)
Admission: RE | Admit: 2014-06-12 | Discharge: 2014-06-12 | Disposition: A | Discharge status: Home / Self Care | Payer: Medicare Other | Source: Ambulatory Visit | Attending: Internal Medicine | Admitting: Internal Medicine

## 2014-06-12 DIAGNOSIS — J441 Chronic obstructive pulmonary disease with (acute) exacerbation: Secondary | ICD-10-CM | POA: Diagnosis not present

## 2014-06-12 NOTE — Progress Notes (Signed)
I have reviewed a Home Exercise Prescription with [name] . Valerie Gay is currently utilizing her resistance bands for exercise at home.  The patient was advised to walk 2-3 days days a week for 25 minutes at [employer], [place], or the grocery store.  Hoyle Sauer and I discussed how to progress their exercise prescription.  The patient stated that their goals were to increase stamina and strength.  The patient stated that they understand the exercise prescription.  We reviewed exercise guidelines, target heart rate during exercise, oxygen use, weather, home pulse oximeter, endpoints for exercise, and goals.  Patient is encouraged to come to me with any questions. I will continue to follow up with the patient to assist them with progression and safety.

## 2014-06-12 NOTE — Progress Notes (Signed)
Today, [name] exercised at [employer]. [facility]. Service time was from 10:30am to 12:00pm.  The patient exercised for more than 31 minutes performing aerobic, strengthening, and stretching exercises. Oxygen saturation, heart rate, blood pressure, rate of perceived exertion, and shortness of breath were all monitored before, during, and after exercise. [name] presented with no problems at today's exercise session.   There was a decrease in workload change during today's exercise session.  Pre-exercise vitals:   Weight kg: 69.4   Liters of O2: ra   SpO2: 93   HR: 101   BP: 132/64   CBG: na  Exercise vitals:   Highest heartrate:  107   Lowest oxygen saturation: 94   Highest blood pressure: 118/70   Liters of 02: ra  Post-exercise vitals:   SpO2: 96   HR: 85   BP: 112/64   Liters of O2: ra   CBG: na  Dr. Brand Males, Medical Director [name] is immediately available during today's Pulmonary Rehab session for Valerie Gay on 06/12/14 at 10:30am class time.

## 2014-06-13 ENCOUNTER — Telehealth: Payer: Self-pay | Admitting: Internal Medicine

## 2014-06-13 NOTE — Telephone Encounter (Signed)
Spoke with patient-she is aware to stop Spiriva and go to nearest ER if gets worse-pt was advised to continue to take her Symbicort, albuterol HFA and albuterol neb in the meantime. MR will be back in the office on 06-22-14 and I will forward message to him to make him aware of what has happened.   Spiriva has been added to her allergy list.

## 2014-06-13 NOTE — Telephone Encounter (Signed)
Spoke with patient-she states she started taking Spiriva and is now having itching, hives, and rash on upper body. Denies any swelling in face or throat area. Pt does have Benadryl 25 mg at home and will go ahead and take 1 by mouth now. Will have BQ advise as he is doc of the PM.

## 2014-06-13 NOTE — Telephone Encounter (Signed)
Sounds like a perfect plan Go to ED if worse

## 2014-06-14 ENCOUNTER — Encounter (HOSPITAL_COMMUNITY): Payer: Medicare Other

## 2014-06-19 ENCOUNTER — Encounter (HOSPITAL_COMMUNITY)
Admission: RE | Admit: 2014-06-19 | Discharge: 2014-06-19 | Disposition: A | Discharge status: Home / Self Care | Payer: Medicare Other | Source: Ambulatory Visit | Attending: Internal Medicine | Admitting: Internal Medicine

## 2014-06-19 DIAGNOSIS — F17200 Nicotine dependence, unspecified, uncomplicated: Secondary | ICD-10-CM | POA: Diagnosis not present

## 2014-06-19 DIAGNOSIS — J441 Chronic obstructive pulmonary disease with (acute) exacerbation: Secondary | ICD-10-CM | POA: Insufficient documentation

## 2014-06-19 DIAGNOSIS — J9600 Acute respiratory failure, unspecified whether with hypoxia or hypercapnia: Secondary | ICD-10-CM | POA: Diagnosis not present

## 2014-06-19 NOTE — Progress Notes (Signed)
Today, Valerie Gay exercised at [employer]. [facility]. Service time was from 1030 to 1200.  The patient exercised for more than 31 minutes performing aerobic, strengthening, and stretching exercises. Oxygen saturation, heart rate, blood pressure, rate of perceived exertion, and shortness of breath were all monitored before, during, and after exercise. Valerie Gay presented with no problems at today's exercise session.   There was no workload change during today's exercise session.  Pre-exercise vitals:   Weight kg: 69.1   Liters of O2: RA   SpO2: 96   HR: 85   BP: 124/78   CBG: NA  Exercise vitals:   Highest heartrate:  113   Lowest oxygen saturation: 93   Highest blood pressure: 110/60   Liters of 02: RA  Post-exercise vitals:   SpO2: 96   HR: 82   BP: 114/70   Liters of O2: RA   CBG: NA Dr. Brand Gay, Medical Director [name] is immediately available during today's Pulmonary Rehab session for Valerie Gay on 06/19/2014 at 1030 class time.

## 2014-06-21 ENCOUNTER — Encounter (HOSPITAL_COMMUNITY)
Admission: RE | Admit: 2014-06-21 | Discharge: 2014-06-21 | Disposition: A | Discharge status: Home / Self Care | Payer: Medicare Other | Source: Ambulatory Visit | Attending: Internal Medicine | Admitting: Internal Medicine

## 2014-06-21 DIAGNOSIS — J441 Chronic obstructive pulmonary disease with (acute) exacerbation: Secondary | ICD-10-CM | POA: Diagnosis not present

## 2014-06-21 NOTE — Progress Notes (Signed)
Today, [place] exercised at [employer]. [facility]. Service time was from 1030 to 1230.  The patient exercised for more than 31 minutes performing aerobic, strengthening, and stretching exercises. Oxygen saturation, heart rate, blood pressure, rate of perceived exertion, and shortness of breath were all monitored before, during, and after exercise. Valerie Gay presented with no problems at today's exercise session. Valerie Gay also attended an education session on Advanced directives.  There was a workload change during today's exercise session.  Pre-exercise vitals:   Weight kg: 69.4   Liters of O2: ra   SpO2: 94   HR: 80   BP: 112/80   CBG: na  Exercise vitals:   Highest heartrate:  111   Lowest oxygen saturation: 94   Highest blood pressure: 130/80   Liters of 02: ra  Post-exercise vitals:   SpO2: 96   HR: 79   BP: 124/60   Liters of O2: ra   CBG: na  Dr. Brand Males, Medical Director Dr. Frederic Jericho is immediately available during today's Pulmonary Rehab session for Valerie Gay on 06/21/2014 at 1030 class time.

## 2014-06-22 ENCOUNTER — Observation Stay (HOSPITAL_COMMUNITY)
Admission: EM | Admit: 2014-06-22 | Discharge: 2014-06-25 | Disposition: A | Discharge status: Home / Self Care | Payer: Medicare Other | Attending: General Surgery | Admitting: General Surgery

## 2014-06-22 ENCOUNTER — Other Ambulatory Visit: Payer: Self-pay

## 2014-06-22 ENCOUNTER — Encounter (HOSPITAL_COMMUNITY)
Admission: EM | Disposition: A | Discharge status: Home / Self Care | Payer: Self-pay | Source: Home / Self Care | Attending: Emergency Medicine

## 2014-06-22 ENCOUNTER — Encounter (HOSPITAL_COMMUNITY): Payer: Self-pay | Admitting: Emergency Medicine

## 2014-06-22 ENCOUNTER — Ambulatory Visit (HOSPITAL_COMMUNITY): Payer: Medicare Other | Admitting: Critical Care Medicine

## 2014-06-22 ENCOUNTER — Observation Stay (HOSPITAL_COMMUNITY): Payer: Medicare Other

## 2014-06-22 ENCOUNTER — Emergency Department (HOSPITAL_COMMUNITY): Payer: Medicare Other

## 2014-06-22 ENCOUNTER — Observation Stay (HOSPITAL_COMMUNITY): Payer: Medicare Other | Admitting: Critical Care Medicine

## 2014-06-22 DIAGNOSIS — K8020 Calculus of gallbladder without cholecystitis without obstruction: Secondary | ICD-10-CM | POA: Diagnosis not present

## 2014-06-22 DIAGNOSIS — R1013 Epigastric pain: Secondary | ICD-10-CM | POA: Diagnosis not present

## 2014-06-22 DIAGNOSIS — J4489 Other specified chronic obstructive pulmonary disease: Secondary | ICD-10-CM | POA: Diagnosis not present

## 2014-06-22 DIAGNOSIS — Z01811 Encounter for preprocedural respiratory examination: Secondary | ICD-10-CM | POA: Diagnosis not present

## 2014-06-22 DIAGNOSIS — Z9981 Dependence on supplemental oxygen: Secondary | ICD-10-CM | POA: Insufficient documentation

## 2014-06-22 DIAGNOSIS — J9601 Acute respiratory failure with hypoxia: Secondary | ICD-10-CM

## 2014-06-22 DIAGNOSIS — R9389 Abnormal findings on diagnostic imaging of other specified body structures: Secondary | ICD-10-CM | POA: Diagnosis present

## 2014-06-22 DIAGNOSIS — J449 Chronic obstructive pulmonary disease, unspecified: Secondary | ICD-10-CM | POA: Diagnosis present

## 2014-06-22 DIAGNOSIS — J189 Pneumonia, unspecified organism: Secondary | ICD-10-CM

## 2014-06-22 DIAGNOSIS — J441 Chronic obstructive pulmonary disease with (acute) exacerbation: Secondary | ICD-10-CM | POA: Diagnosis not present

## 2014-06-22 DIAGNOSIS — D1803 Hemangioma of intra-abdominal structures: Secondary | ICD-10-CM | POA: Diagnosis not present

## 2014-06-22 DIAGNOSIS — F17200 Nicotine dependence, unspecified, uncomplicated: Secondary | ICD-10-CM | POA: Diagnosis not present

## 2014-06-22 DIAGNOSIS — K819 Cholecystitis, unspecified: Secondary | ICD-10-CM | POA: Diagnosis not present

## 2014-06-22 DIAGNOSIS — K8000 Calculus of gallbladder with acute cholecystitis without obstruction: Principal | ICD-10-CM | POA: Diagnosis present

## 2014-06-22 DIAGNOSIS — K810 Acute cholecystitis: Secondary | ICD-10-CM | POA: Diagnosis present

## 2014-06-22 DIAGNOSIS — K8042 Calculus of bile duct with acute cholecystitis without obstruction: Secondary | ICD-10-CM | POA: Diagnosis not present

## 2014-06-22 HISTORY — PX: CHOLECYSTECTOMY: SHX55

## 2014-06-22 LAB — CBC WITH DIFFERENTIAL/PLATELET
Basophils Absolute: 0 10*3/uL (ref 0.0–0.1)
Basophils Relative: 0 % (ref 0–1)
Eosinophils Absolute: 0.1 10*3/uL (ref 0.0–0.7)
Eosinophils Relative: 1 % (ref 0–5)
HCT: 37.5 % (ref 36.0–46.0)
Hemoglobin: 12.2 g/dL (ref 12.0–15.0)
LYMPHS PCT: 13 % (ref 12–46)
Lymphs Abs: 1.8 10*3/uL (ref 0.7–4.0)
MCH: 31.3 pg (ref 26.0–34.0)
MCHC: 32.5 g/dL (ref 30.0–36.0)
MCV: 96.2 fL (ref 78.0–100.0)
Monocytes Absolute: 1 10*3/uL (ref 0.1–1.0)
Monocytes Relative: 7 % (ref 3–12)
NEUTROS ABS: 11 10*3/uL — AB (ref 1.7–7.7)
Neutrophils Relative %: 79 % — ABNORMAL HIGH (ref 43–77)
PLATELETS: 403 10*3/uL — AB (ref 150–400)
RBC: 3.9 MIL/uL (ref 3.87–5.11)
RDW: 14.6 % (ref 11.5–15.5)
WBC: 13.8 10*3/uL — AB (ref 4.0–10.5)

## 2014-06-22 LAB — COMPREHENSIVE METABOLIC PANEL
ALT: 11 U/L (ref 0–35)
AST: 13 U/L (ref 0–37)
Albumin: 3.6 g/dL (ref 3.5–5.2)
Alkaline Phosphatase: 75 U/L (ref 39–117)
Anion gap: 13 (ref 5–15)
BUN: 20 mg/dL (ref 6–23)
CO2: 25 meq/L (ref 19–32)
Calcium: 9 mg/dL (ref 8.4–10.5)
Chloride: 101 mEq/L (ref 96–112)
Creatinine, Ser: 0.57 mg/dL (ref 0.50–1.10)
GFR calc Af Amer: >90 mL/min (ref >90)
GFR, EST NON AFRICAN AMERICAN: 88 mL/min — AB (ref >90)
Glucose, Bld: 124 mg/dL — ABNORMAL HIGH (ref 70–99)
Potassium: 3.7 mEq/L (ref 3.7–5.3)
Sodium: 139 mEq/L (ref 137–147)
Total Bilirubin: 0.7 mg/dL (ref 0.3–1.2)
Total Protein: 6.5 g/dL (ref 6.0–8.3)

## 2014-06-22 LAB — SURGICAL PCR SCREEN
MRSA, PCR: NEGATIVE
STAPHYLOCOCCUS AUREUS: NEGATIVE

## 2014-06-22 LAB — POCT I-STAT TROPONIN I: Troponin i, poc: 0 ng/mL (ref 0.00–0.08)

## 2014-06-22 LAB — TROPONIN I

## 2014-06-22 LAB — LIPASE, BLOOD: Lipase: 22 U/L (ref 11–59)

## 2014-06-22 SURGERY — LAPAROSCOPIC CHOLECYSTECTOMY WITH INTRAOPERATIVE CHOLANGIOGRAM
Anesthesia: General | Site: Abdomen

## 2014-06-22 MED ORDER — FENTANYL CITRATE 0.05 MG/ML IJ SOLN
INTRAMUSCULAR | Status: AC
  Filled 2014-06-22: qty 5

## 2014-06-22 MED ORDER — ONDANSETRON HCL 4 MG/2ML IJ SOLN
4.0000 mg | Freq: Once | INTRAMUSCULAR | Status: AC
  Administered 2014-06-22: 4 mg via INTRAVENOUS
  Filled 2014-06-22: qty 2

## 2014-06-22 MED ORDER — HYDROMORPHONE HCL PF 1 MG/ML IJ SOLN
0.5000 mg | INTRAMUSCULAR | Status: DC | PRN
  Administered 2014-06-22: 0.5 mg via INTRAVENOUS
  Filled 2014-06-22: qty 1

## 2014-06-22 MED ORDER — GLYCOPYRROLATE 0.2 MG/ML IJ SOLN
INTRAMUSCULAR | Status: DC | PRN
  Administered 2014-06-22: 0.4 mg via INTRAVENOUS

## 2014-06-22 MED ORDER — NEOSTIGMINE METHYLSULFATE 10 MG/10ML IV SOLN
INTRAVENOUS | Status: DC | PRN
  Administered 2014-06-22: 3 mg via INTRAVENOUS

## 2014-06-22 MED ORDER — ALBUTEROL SULFATE (2.5 MG/3ML) 0.083% IN NEBU
2.5000 mg | INHALATION_SOLUTION | Freq: Three times a day (TID) | RESPIRATORY_TRACT | Status: DC | PRN
  Administered 2014-06-24 (×2): 2.5 mg via RESPIRATORY_TRACT
  Filled 2014-06-22 (×2): qty 3

## 2014-06-22 MED ORDER — LIDOCAINE HCL (CARDIAC) 20 MG/ML IV SOLN
INTRAVENOUS | Status: AC
  Filled 2014-06-22: qty 5

## 2014-06-22 MED ORDER — SODIUM CHLORIDE 0.9 % IV BOLUS (SEPSIS)
500.0000 mL | Freq: Once | INTRAVENOUS | Status: AC
  Administered 2014-06-22: 500 mL via INTRAVENOUS

## 2014-06-22 MED ORDER — NEOSTIGMINE METHYLSULFATE 10 MG/10ML IV SOLN
INTRAVENOUS | Status: AC
  Filled 2014-06-22: qty 1

## 2014-06-22 MED ORDER — GLYCOPYRROLATE 0.2 MG/ML IJ SOLN
INTRAMUSCULAR | Status: AC
  Filled 2014-06-22: qty 2

## 2014-06-22 MED ORDER — BUPIVACAINE-EPINEPHRINE (PF) 0.25% -1:200000 IJ SOLN
INTRAMUSCULAR | Status: AC
  Filled 2014-06-22: qty 30

## 2014-06-22 MED ORDER — ALBUTEROL SULFATE HFA 108 (90 BASE) MCG/ACT IN AERS: 2.0000 | INHALATION_SPRAY | Freq: Four times a day (QID) | RESPIRATORY_TRACT | Status: DC | PRN

## 2014-06-22 MED ORDER — OXYCODONE HCL 5 MG PO TABS
5.0000 mg | ORAL_TABLET | ORAL | Status: DC | PRN
  Administered 2014-06-22: 5 mg via ORAL

## 2014-06-22 MED ORDER — DIPHENHYDRAMINE HCL 50 MG/ML IJ SOLN: 12.5000 mg | Freq: Four times a day (QID) | INTRAMUSCULAR | Status: DC | PRN

## 2014-06-22 MED ORDER — PANTOPRAZOLE SODIUM 40 MG PO TBEC
40.0000 mg | DELAYED_RELEASE_TABLET | Freq: Every day | ORAL | Status: DC
  Administered 2014-06-23 – 2014-06-25 (×3): 40 mg via ORAL
  Filled 2014-06-22 (×3): qty 1

## 2014-06-22 MED ORDER — ONDANSETRON HCL 4 MG/2ML IJ SOLN: 4.0000 mg | Freq: Once | INTRAMUSCULAR | Status: DC | PRN

## 2014-06-22 MED ORDER — LACTATED RINGERS IV SOLN
INTRAVENOUS | Status: DC
  Administered 2014-06-22 (×2): via INTRAVENOUS

## 2014-06-22 MED ORDER — DOCUSATE SODIUM 100 MG PO CAPS
100.0000 mg | ORAL_CAPSULE | Freq: Two times a day (BID) | ORAL | Status: DC | PRN
  Filled 2014-06-22: qty 1

## 2014-06-22 MED ORDER — BUPIVACAINE-EPINEPHRINE 0.25% -1:200000 IJ SOLN
INTRAMUSCULAR | Status: DC | PRN
  Administered 2014-06-22: 30 mL

## 2014-06-22 MED ORDER — HYDROMORPHONE HCL PF 1 MG/ML IJ SOLN
0.5000 mg | Freq: Once | INTRAMUSCULAR | Status: AC
  Administered 2014-06-22: 0.5 mg via INTRAVENOUS
  Filled 2014-06-22: qty 1

## 2014-06-22 MED ORDER — PROPOFOL 10 MG/ML IV BOLUS
INTRAVENOUS | Status: DC | PRN
  Administered 2014-06-22: 100 mg via INTRAVENOUS

## 2014-06-22 MED ORDER — KETOROLAC TROMETHAMINE 30 MG/ML IJ SOLN: 15.0000 mg | Freq: Once | INTRAMUSCULAR | Status: DC | PRN

## 2014-06-22 MED ORDER — SODIUM CHLORIDE 0.9 % IV SOLN
INTRAVENOUS | Status: DC | PRN
  Administered 2014-06-22: 15:00:00

## 2014-06-22 MED ORDER — KCL IN DEXTROSE-NACL 20-5-0.45 MEQ/L-%-% IV SOLN
INTRAVENOUS | Status: DC
  Administered 2014-06-22 – 2014-06-23 (×2): via INTRAVENOUS
  Filled 2014-06-22 (×5): qty 1000

## 2014-06-22 MED ORDER — TRIAMCINOLONE ACETONIDE 55 MCG/ACT NA AERO
2.0000 | INHALATION_SPRAY | Freq: Every day | NASAL | Status: DC
  Administered 2014-06-23 – 2014-06-25 (×3): 2 via NASAL
  Filled 2014-06-22: qty 21.6

## 2014-06-22 MED ORDER — MORPHINE SULFATE 4 MG/ML IJ SOLN
4.0000 mg | Freq: Once | INTRAMUSCULAR | Status: AC
  Administered 2014-06-22: 4 mg via INTRAVENOUS
  Filled 2014-06-22: qty 1

## 2014-06-22 MED ORDER — BUDESONIDE-FORMOTEROL FUMARATE 160-4.5 MCG/ACT IN AERO
2.0000 | INHALATION_SPRAY | Freq: Two times a day (BID) | RESPIRATORY_TRACT | Status: DC
  Administered 2014-06-23 – 2014-06-25 (×4): 2 via RESPIRATORY_TRACT
  Filled 2014-06-22 (×2): qty 6

## 2014-06-22 MED ORDER — SUCCINYLCHOLINE CHLORIDE 20 MG/ML IJ SOLN
INTRAMUSCULAR | Status: DC | PRN
  Administered 2014-06-22: 80 mg via INTRAVENOUS

## 2014-06-22 MED ORDER — SODIUM CHLORIDE 0.9 % IR SOLN
Status: DC | PRN
  Administered 2014-06-22 (×2): 1000 mL

## 2014-06-22 MED ORDER — FENTANYL CITRATE 0.05 MG/ML IJ SOLN
INTRAMUSCULAR | Status: DC | PRN
  Administered 2014-06-22: 25 ug via INTRAVENOUS
  Administered 2014-06-22 (×3): 50 ug via INTRAVENOUS
  Administered 2014-06-22: 75 ug via INTRAVENOUS

## 2014-06-22 MED ORDER — HYDROMORPHONE HCL PF 1 MG/ML IJ SOLN
0.2500 mg | INTRAMUSCULAR | Status: DC | PRN
  Administered 2014-06-22: 0.5 mg via INTRAVENOUS

## 2014-06-22 MED ORDER — PIPERACILLIN-TAZOBACTAM 3.375 G IVPB 30 MIN
3.3750 g | INTRAVENOUS | Status: AC
  Filled 2014-06-22: qty 50

## 2014-06-22 MED ORDER — HYDROMORPHONE HCL PF 1 MG/ML IJ SOLN: 0.5000 mg | INTRAMUSCULAR | Status: DC | PRN

## 2014-06-22 MED ORDER — ENOXAPARIN SODIUM 40 MG/0.4ML ~~LOC~~ SOLN
40.0000 mg | SUBCUTANEOUS | Status: DC
  Administered 2014-06-23 – 2014-06-24 (×2): 40 mg via SUBCUTANEOUS
  Filled 2014-06-22 (×3): qty 0.4

## 2014-06-22 MED ORDER — OXYCODONE HCL 5 MG PO TABS
ORAL_TABLET | ORAL | Status: AC
  Filled 2014-06-22: qty 1

## 2014-06-22 MED ORDER — HYDROMORPHONE HCL PF 1 MG/ML IJ SOLN
INTRAMUSCULAR | Status: AC
  Filled 2014-06-22: qty 1

## 2014-06-22 MED ORDER — ACETAMINOPHEN 650 MG RE SUPP: 650.0000 mg | Freq: Four times a day (QID) | RECTAL | Status: DC | PRN

## 2014-06-22 MED ORDER — ALBUTEROL SULFATE HFA 108 (90 BASE) MCG/ACT IN AERS
INHALATION_SPRAY | RESPIRATORY_TRACT | Status: AC
  Filled 2014-06-22: qty 6.7

## 2014-06-22 MED ORDER — PIPERACILLIN-TAZOBACTAM 3.375 G IVPB
3.3750 g | Freq: Three times a day (TID) | INTRAVENOUS | Status: DC
  Administered 2014-06-22: 3.375 g via INTRAVENOUS
  Filled 2014-06-22 (×3): qty 50

## 2014-06-22 MED ORDER — PIPERACILLIN-TAZOBACTAM 3.375 G IVPB
3.3750 g | Freq: Once | INTRAVENOUS | Status: AC
  Administered 2014-06-22: 3.375 g via INTRAVENOUS
  Filled 2014-06-22: qty 50

## 2014-06-22 MED ORDER — 0.9 % SODIUM CHLORIDE (POUR BTL) OPTIME
TOPICAL | Status: DC | PRN
  Administered 2014-06-22: 1000 mL

## 2014-06-22 MED ORDER — ACETAMINOPHEN 325 MG PO TABS: 650.0000 mg | ORAL_TABLET | Freq: Four times a day (QID) | ORAL | Status: DC | PRN

## 2014-06-22 MED ORDER — ONDANSETRON HCL 4 MG/2ML IJ SOLN
INTRAMUSCULAR | Status: DC | PRN
  Administered 2014-06-22: 4 mg via INTRAVENOUS

## 2014-06-22 MED ORDER — METOCLOPRAMIDE HCL 5 MG/ML IJ SOLN
INTRAMUSCULAR | Status: AC
  Filled 2014-06-22: qty 2

## 2014-06-22 MED ORDER — BUDESONIDE 0.25 MG/2ML IN SUSP
0.2500 mg | Freq: Four times a day (QID) | RESPIRATORY_TRACT | Status: DC
  Administered 2014-06-23 (×3): 0.25 mg via RESPIRATORY_TRACT
  Filled 2014-06-22 (×8): qty 2

## 2014-06-22 MED ORDER — PHENYLEPHRINE 40 MCG/ML (10ML) SYRINGE FOR IV PUSH (FOR BLOOD PRESSURE SUPPORT)
PREFILLED_SYRINGE | INTRAVENOUS | Status: AC
  Filled 2014-06-22: qty 10

## 2014-06-22 MED ORDER — DEXAMETHASONE SODIUM PHOSPHATE 4 MG/ML IJ SOLN
INTRAMUSCULAR | Status: AC
  Filled 2014-06-22: qty 1

## 2014-06-22 MED ORDER — ONDANSETRON HCL 4 MG/2ML IJ SOLN
4.0000 mg | Freq: Four times a day (QID) | INTRAMUSCULAR | Status: DC | PRN
  Administered 2014-06-22 – 2014-06-23 (×2): 4 mg via INTRAVENOUS
  Filled 2014-06-22 (×3): qty 2

## 2014-06-22 MED ORDER — ALBUTEROL SULFATE HFA 108 (90 BASE) MCG/ACT IN AERS
INHALATION_SPRAY | RESPIRATORY_TRACT | Status: DC | PRN
  Administered 2014-06-22: 2 via RESPIRATORY_TRACT

## 2014-06-22 MED ORDER — ALBUTEROL SULFATE (2.5 MG/3ML) 0.083% IN NEBU
2.5000 mg | INHALATION_SOLUTION | Freq: Four times a day (QID) | RESPIRATORY_TRACT | Status: DC
  Administered 2014-06-22 (×2): 2.5 mg via RESPIRATORY_TRACT
  Filled 2014-06-22 (×2): qty 3

## 2014-06-22 MED ORDER — DEXAMETHASONE SODIUM PHOSPHATE 4 MG/ML IJ SOLN
INTRAMUSCULAR | Status: DC | PRN
  Administered 2014-06-22: 4 mg via INTRAVENOUS

## 2014-06-22 MED ORDER — PHENYLEPHRINE HCL 10 MG/ML IJ SOLN
INTRAMUSCULAR | Status: DC | PRN
  Administered 2014-06-22 (×2): 80 ug via INTRAVENOUS
  Administered 2014-06-22: 120 ug via INTRAVENOUS
  Administered 2014-06-22: 80 ug via INTRAVENOUS
  Administered 2014-06-22 (×2): 40 ug via INTRAVENOUS

## 2014-06-22 MED ORDER — ROCURONIUM BROMIDE 100 MG/10ML IV SOLN
INTRAVENOUS | Status: DC | PRN
  Administered 2014-06-22: 30 mg via INTRAVENOUS

## 2014-06-22 MED ORDER — DIPHENHYDRAMINE HCL 12.5 MG/5ML PO ELIX
12.5000 mg | ORAL_SOLUTION | Freq: Four times a day (QID) | ORAL | Status: DC | PRN
  Administered 2014-06-24: 12.5 mg via ORAL
  Filled 2014-06-22: qty 10

## 2014-06-22 MED ORDER — METOCLOPRAMIDE HCL 5 MG/ML IJ SOLN
INTRAMUSCULAR | Status: DC | PRN
  Administered 2014-06-22: 10 mg via INTRAVENOUS

## 2014-06-22 MED ORDER — PROPOFOL 10 MG/ML IV BOLUS
INTRAVENOUS | Status: AC
Start: 2014-06-22 — End: 2014-06-22
  Filled 2014-06-22: qty 20

## 2014-06-22 MED ORDER — ONDANSETRON HCL 4 MG/2ML IJ SOLN
INTRAMUSCULAR | Status: AC
  Filled 2014-06-22: qty 2

## 2014-06-22 MED ORDER — LIDOCAINE HCL (CARDIAC) 20 MG/ML IV SOLN
INTRAVENOUS | Status: DC | PRN
  Administered 2014-06-22: 20 mg via INTRAVENOUS

## 2014-06-22 MED ORDER — MIDAZOLAM HCL 2 MG/2ML IJ SOLN
INTRAMUSCULAR | Status: AC
  Filled 2014-06-22: qty 2

## 2014-06-22 SURGICAL SUPPLY — 48 items
APL SKNCLS STERI-STRIP NONHPOA (GAUZE/BANDAGES/DRESSINGS) ×1
APPLIER CLIP 5 13 M/L LIGAMAX5 (MISCELLANEOUS) ×2
APR CLP MED LRG 5 ANG JAW (MISCELLANEOUS) ×1
BAG SPEC RTRVL LRG 6X4 10 (ENDOMECHANICALS) ×1
BANDAGE ADH SHEER 1  50/CT (GAUZE/BANDAGES/DRESSINGS) ×6 IMPLANT
BENZOIN TINCTURE PRP APPL 2/3 (GAUZE/BANDAGES/DRESSINGS) ×2 IMPLANT
BLADE SURG ROTATE 9660 (MISCELLANEOUS) IMPLANT
CANISTER SUCTION 2500CC (MISCELLANEOUS) ×2 IMPLANT
CHLORAPREP W/TINT 26ML (MISCELLANEOUS) ×2 IMPLANT
CLIP APPLIE 5 13 M/L LIGAMAX5 (MISCELLANEOUS) ×1 IMPLANT
COVER MAYO STAND STRL (DRAPES) ×2 IMPLANT
COVER SURGICAL LIGHT HANDLE (MISCELLANEOUS) ×2 IMPLANT
DECANTER SPIKE VIAL GLASS SM (MISCELLANEOUS) ×1 IMPLANT
DRAPE C-ARM 42X72 X-RAY (DRAPES) ×2 IMPLANT
DRAPE UTILITY 15X26 W/TAPE STR (DRAPE) ×4 IMPLANT
DRSG TEGADERM 2-3/8X2-3/4 SM (GAUZE/BANDAGES/DRESSINGS) ×1 IMPLANT
DRSG TEGADERM 4X4.75 (GAUZE/BANDAGES/DRESSINGS) ×1 IMPLANT
ELECT REM PT RETURN 9FT ADLT (ELECTROSURGICAL) ×2
ELECTRODE REM PT RTRN 9FT ADLT (ELECTROSURGICAL) ×1 IMPLANT
GAUZE SPONGE 2X2 8PLY STRL LF (GAUZE/BANDAGES/DRESSINGS) ×1 IMPLANT
GLOVE BIOGEL M STRL SZ7.5 (GLOVE) ×2 IMPLANT
GLOVE BIOGEL PI IND STRL 7.0 (GLOVE) ×2 IMPLANT
GLOVE BIOGEL PI IND STRL 8 (GLOVE) ×1 IMPLANT
GLOVE BIOGEL PI INDICATOR 7.0 (GLOVE) ×2
GLOVE BIOGEL PI INDICATOR 8 (GLOVE) ×1
GLOVE SURG SS PI 7.0 STRL IVOR (GLOVE) ×2 IMPLANT
GOWN STRL REUS W/ TWL LRG LVL3 (GOWN DISPOSABLE) ×3 IMPLANT
GOWN STRL REUS W/ TWL XL LVL3 (GOWN DISPOSABLE) ×1 IMPLANT
GOWN STRL REUS W/TWL LRG LVL3 (GOWN DISPOSABLE) ×6
GOWN STRL REUS W/TWL XL LVL3 (GOWN DISPOSABLE) ×2
KIT BASIN OR (CUSTOM PROCEDURE TRAY) ×2 IMPLANT
KIT ROOM TURNOVER OR (KITS) ×2 IMPLANT
NS IRRIG 1000ML POUR BTL (IV SOLUTION) ×2 IMPLANT
PAD ARMBOARD 7.5X6 YLW CONV (MISCELLANEOUS) ×3 IMPLANT
POUCH SPECIMEN RETRIEVAL 10MM (ENDOMECHANICALS) ×2 IMPLANT
SCISSORS LAP 5X35 DISP (ENDOMECHANICALS) ×2 IMPLANT
SET CHOLANGIOGRAPH 5 50 .035 (SET/KITS/TRAYS/PACK) ×2 IMPLANT
SET IRRIG TUBING LAPAROSCOPIC (IRRIGATION / IRRIGATOR) ×2 IMPLANT
SLEEVE ENDOPATH XCEL 5M (ENDOMECHANICALS) ×4 IMPLANT
SPECIMEN JAR SMALL (MISCELLANEOUS) ×1 IMPLANT
SPONGE GAUZE 2X2 STER 10/PKG (GAUZE/BANDAGES/DRESSINGS) ×1
SUT MNCRL AB 4-0 PS2 18 (SUTURE) ×4 IMPLANT
SUT VICRYL 0 UR6 27IN ABS (SUTURE) ×1 IMPLANT
TOWEL OR 17X24 6PK STRL BLUE (TOWEL DISPOSABLE) ×2 IMPLANT
TOWEL OR 17X26 10 PK STRL BLUE (TOWEL DISPOSABLE) ×2 IMPLANT
TRAY LAPAROSCOPIC (CUSTOM PROCEDURE TRAY) ×2 IMPLANT
TROCAR XCEL BLUNT TIP 100MML (ENDOMECHANICALS) ×2 IMPLANT
TROCAR XCEL NON-BLD 5MMX100MML (ENDOMECHANICALS) ×2 IMPLANT

## 2014-06-22 NOTE — Consult Note (Signed)
PULMONARY / CRITICAL CARE MEDICINE   Name: Valerie Gay MRN: [id number] DOB: Sep 05, 1941    ADMISSION DATE:  06/22/2014 CONSULTATION DATE:8/7  REFERRING MD :  [facility]  CHIEF COMPLAINT: ABD pain  INITIAL PRESENTATION: ABD pain x 18 hrs  STUDIES:  8/7 abd [place]: Cholecystitis/cholelithiasis   SIGNIFICANT EVENTS: Scheduled for surgery   HISTORY OF PRESENT ILLNESS:  73 yo WF life long smoker, currently 1/2 PPD and has a 21 mg nicotine patch on left arm, who presents with 18 hours of abdominal pain and  Nausea. [facility] has evaluated her for  intervention for acute cholecystitis and requests a pulmonary consult. She is followed by Dr. Janelle Floor to have stopped smoking but has continued to smoke). Reported to be on nocturnal O2 @ 2lnc but wears it when she feel like she needs it. She is able to ambulate in pulmonary rehabilitation with out SOB. Recent PFT and CT scan reveal mild to moderated obstructive disease. She uses symbicort inhaler and albuterol/atrovent nebulizer. Currently she is in no acute respiratory distress and based on her ADL's she should tolerate surgery. She is of course at greater risk for pulmonary complications due to her disease process and non compliance. PCCM will follow while she is in the hospita  PAST MEDICAL HISTORY :  Past Medical History  Diagnosis Date  . Diverticulosis   . Colon polyps     2010   . COPD (chronic obstructive pulmonary disease)    Past Surgical History  Procedure Laterality Date  . Tonsillectomy    . Cataract extraction Right     August 15, 2013   Prior to Admission medications   Medication Sig Start Date End Date Taking? Authorizing Provider  albuterol (PROVENTIL HFA;VENTOLIN HFA) 108 (90 BASE) MCG/ACT inhaler Inhale 2 puffs into the lungs every 6 (six) hours as needed for wheezing or shortness of breath. 03/30/14  Yes [name], MD  albuterol (PROVENTIL) (2.5 MG/3ML) 0.083% nebulizer solution Take 3 mLs (2.5 mg total) by  nebulization every 8 (eight) hours as needed for wheezing or shortness of breath. 05/25/14  Yes Tammy S Parrett, NP  aspirin 81 MG tablet Take 81-162 mg by mouth daily.   Yes Historical Provider, MD  budesonide-formoterol (SYMBICORT) 160-4.5 MCG/ACT inhaler Inhale 2 puffs into the lungs 2 (two) times daily. 05/25/14  Yes Tammy S Parrett, NP  diphenhydrAMINE (BENADRYL) 25 mg capsule Take 25 mg by mouth every 6 (six) hours as needed for itching.   Yes Historical Provider, MD  triamcinolone (NASACORT) 55 MCG/ACT AERO nasal inhaler Place 2 sprays into the nose daily. 03/30/14   [name], MD   Allergies  Allergen Reactions  . Spiriva Handihaler [Tiotropium Bromide Monohydrate] Other (See Comments)    Itching, rash, hives on upper body    FAMILY HISTORY:  No family history on file. SOCIAL HISTORY:  reports that she has been smoking Cigarettes.  She has a 30 pack-year smoking history. She does not have any smokeless tobacco history on file. She reports that she does not drink alcohol. Her drug history is not on file.  REVIEW OF SYSTEMS:  10 point review of system taken, please see HPI for positives and negatives.   SUBJECTIVE:   VITAL SIGNS: Temp:  [97.5 F (36.4 C)] 97.5 F (36.4 C) (08/07 0015) Pulse Rate:  [66-86] 79 (08/07 0830) Resp:  [24] 24 (08/07 0015) BP: (105-123)/(34-67) 110/38 mmHg (08/07 0830) SpO2:  [94 %-100 %] 95 % (08/07 0830) Weight:  [150 lb (68.04 kg)] 150 lb (  68.04 kg) (08/07 0015) HEMODYNAMICS:   VENTILATOR SETTINGS:   INTAKE / OUTPUT:  Intake/Output Summary (Last 24 hours) at 06/22/14 0916 Last data filed at 06/22/14 0835  Gross per 24 hour  Intake     50 ml  Output      0 ml  Net     50 ml    PHYSICAL EXAMINATION: General:  WNWDWF in NAD Neuro: Intact HEENT: No JVD?LAN Cardiovascular:  HSR RRR Lungs:  decreased bs, no wheeze or rhonchi Abdomen: Diffusely tender Musculoskeletal:  intact Skin: warm and dry  LABS: PULMONARY No results found for  this basename: PHART, PCO2, PCO2ART, PO2, PO2ART, HCO3, TCO2, O2SAT,  in the last 168 hours  CBC  Recent Labs Lab 06/22/14 0055  HGB 12.2  HCT 37.5  WBC 13.8*  PLT 403*    COAGULATION No results found for this basename: INR,  in the last 168 hours  CARDIAC   Recent Labs Lab 06/22/14 0254  TROPONINI <0.30   No results found for this basename: PROBNP,  in the last 168 hours   CHEMISTRY  Recent Labs Lab 06/22/14 0055  NA 139  K 3.7  CL 101  CO2 25  GLUCOSE 124*  BUN 20  CREATININE 0.57  CALCIUM 9.0   Estimated Creatinine Clearance: 57.6 ml/min (by C-G formula based on Cr of 0.57).   LIVER  Recent Labs Lab 06/22/14 0055  AST 13  ALT 11  ALKPHOS 75  BILITOT 0.7  PROT 6.5  ALBUMIN 3.6     INFECTIOUS No results found for this basename: LATICACIDVEN, PROCALCITON,  in the last 168 hours   ENDOCRINE CBG (last 3)  No results found for this basename: GLUCAP,  in the last 72 hours       IMAGING x48h No results found.     ASSESSMENT / PLAN:  PULMONARY OETT A: COPD with O 2 dependency, continued tobacco abuse. P:   O2 as needed DC Nicoderm patch till after surgery BD's via nebs till after surgery IS and flutter valve  CARDIOVASCULAR CVL A:  No acute issue P:    RENAL A:  No acute issue P:     GASTROINTESTINAL A:   Cholecystitis P:   PER [facility]  HEMATOLOGIC A:  No acute issue P:    INFECTIOUS A:   Cholecystitis  P:   BCx2  UC  Sputum Abx: pip-tazo, start date 8/7, day 0/x  ENDOCRINE A:     No acute issue P:     NEUROLOGIC A:   No acute issue P:   RASS goal: 1   TODAY'S SUMMARY:  73 yo WF life long smoker, currently 1/2 PPD and has a 21 mg nicotine patch on left arm, who presents with 18 hours of abdominal pain and  Nausea. [facility] has evaluated her for  intervention for acute cholecystitis and requests a pulmonary consult. She is followed by Dr. Janelle Floor to have stopped smoking but has continued  to smoke). Reported to be on nocturnal O2 @ 2lnc but wears it when she feel like she needs it. She is able to ambulate in pulmonary rehabilitation with out SOB. Recent PFT and CT scan reveal mild to moderated obstructive disease. She uses symbicort inhaler and albuterol/atrovent nebulizer. Currently she is in no acute respiratory distress and based on her ADL's she should tolerate surgery. She is of course at greater risk for pulmonary complications due to her disease process and non compliance. PCCM will follow while she is in the hospital.  Richardson Landry Minor ACNP Maryanna Shape PCCM Pager [phone] till 3 pm If no answer page [phone] 06/22/2014, 9:16 AM   STAFF NOTE  Surgical risk calculation as below  [place] Postperative Pulmonary Risk Score Comment Score  Type of surgery - abd ao aneurysm (27), thoracic (21), neurosurgery / upper abdominal / vascular (21), neck (11) Upper abd incision 21, assuming open abdomen  Emergency Surgery - (11) yes 11  ALbumin < 3 or poor nutritional state - (9) no 0  BUN > 30 -  (8) no 0  Partial or completely dependent functional status - (7) no 0  COPD -  (6) yes 6  Age - 51 to 19 (4), > 70  (6) Age 76 6  TOTAL  44  Risk Stratifcation scores  - < 10, 11-19, 20-27, 28-40, >40  HIGH     CANET Postperative Pulmonary Risk Score Comment Score  Age - <50 (0), 50-80 (3), >80 (16) Age 33 3  Preoperative pulse ox - >96 (0), 91-95 (8), <90 (24) 95% 8  Respiratory infection in last month - Yes (17) none 0  Preoperative anemia - < 10gm% - Yes (11) no 0  Surgical incision - Upper abdominal (15), Thoracic (24) Upper agd, lap 15, assumes open abdomen  Duration of surgery - <2h (0), 2-3h (16), >3h (23) 1.5h duration 0  Emergency Surgery - Yes (8) yes 8  TOTAL  34  Risk Stratification - Low (<26), Intermediate (26-44), High (>45)  INTERMEDIATE     Major Pulmonary risks identified in the multifactorial risk analysis are but not limited to a) pneumonia; b) recurrent  intubation risk; c) prolonged or recurrent acute respiratory failure needing mechanical ventilation; d) prolonged hospitalization; e) DVT/Pulmonary embolism; f) Acute Pulmonary edema  Risk ameliorating factors are are normal renal function, functional status, normall hgb , laparascopy  Risk aggravating factors are emergency surgery, upper abdominal incision (if opened up), copd, and age  Overall risk is INTERMEDIATE but Can be HIGH if surgery duration is prolonged and open procedure adopted.   Still risk not prohibitive  Recommend 1. Short duration of surgery as much as possible and avoid paralytic if possible 2. Recovery in step down or ICU with Pulmonary consultation 3. DVT prophylaxis 4. Aggressive pulmonary toilet with o2, bronchodilatation, and incentive spirometry and early ambulation 5. PCCM will follow    Dr. Brand Males, M.D., [facility].C.P Pulmonary and Critical Care Medicine Staff Physician [facility] Pager: [phone], If no answer or between  15:00h - 7:00h: call [phone]  06/22/2014 10:51 AM

## 2014-06-22 NOTE — ED Provider Notes (Addendum)
CSN: [id number]     Arrival date & time 06/22/14  0011 History   First MD Initiated Contact with Patient 06/22/14 0222     Chief Complaint  Patient presents with  . Abdominal Pain  . Nausea     (Consider location/radiation/quality/duration/timing/severity/associated sxs/prior Treatment) Patient is a 73 y.o. female presenting with abdominal pain. The history is provided by the patient.  Abdominal Pain Associated symptoms: no chest pain, no chills, no cough, no diarrhea, no dysuria, no fever, no shortness of breath, no sore throat and no vomiting   pt c/o epigastric pain, onset this afternoon, constant, dull, moderate.  No nv. No specific exacerbating or alleviating factors. No radiation. Denies hx same pain. No hx pud, gallstones, or pancreatitis. Having normal bms. No abd distension. No prior abd surgery. Denies cp or sob.      Past Medical History  Diagnosis Date  . Diverticulosis   . Colon polyps     2010   . COPD (chronic obstructive pulmonary disease)    Past Surgical History  Procedure Laterality Date  . Tonsillectomy    . Cataract extraction Right     August 15, 2013   No family history on file. History  Substance Use Topics  . Smoking status: Current Every Day Smoker -- 0.50 packs/day for 60 years    Types: Cigarettes  . Smokeless tobacco: Not on file     Comment: Currently smoking 3 cigs/day  . Alcohol Use: No   OB History   Grav Para Term Preterm Abortions TAB SAB Ect Mult Living                 Review of Systems  Constitutional: Negative for fever and chills.  HENT: Negative for sore throat.   Eyes: Negative for redness.  Respiratory: Negative for cough and shortness of breath.   Cardiovascular: Negative for chest pain.  Gastrointestinal: Positive for abdominal pain. Negative for vomiting and diarrhea.  Genitourinary: Negative for dysuria and flank pain.  Musculoskeletal: Negative for back pain and neck pain.  Skin: Negative for rash.  Neurological:  Negative for headaches.  Hematological: Does not bruise/bleed easily.  Psychiatric/Behavioral: Negative for confusion.      Allergies  Spiriva handihaler  Home Medications   Prior to Admission medications   Medication Sig Start Date End Date Taking? Authorizing Provider  albuterol (PROVENTIL HFA;VENTOLIN HFA) 108 (90 BASE) MCG/ACT inhaler Inhale 2 puffs into the lungs every 6 (six) hours as needed for wheezing or shortness of breath. 03/30/14   [name], MD  albuterol (PROVENTIL) (2.5 MG/3ML) 0.083% nebulizer solution Take 3 mLs (2.5 mg total) by nebulization every 8 (eight) hours as needed for wheezing or shortness of breath. 05/25/14   Tammy S Parrett, NP  aspirin 81 MG tablet Take 81-162 mg by mouth daily.    Historical Provider, MD  budesonide-formoterol (SYMBICORT) 160-4.5 MCG/ACT inhaler Inhale 2 puffs into the lungs 2 (two) times daily. 05/25/14   Tammy S Parrett, NP  ibuprofen (ADVIL,MOTRIN) 200 MG tablet Take 200 mg by mouth every 6 (six) hours as needed for moderate pain.    Historical Provider, MD  triamcinolone (NASACORT) 55 MCG/ACT AERO nasal inhaler Place 2 sprays into the nose daily. 03/30/14   [name], MD   BP 123/58  Pulse 86  Temp(Src) 97.5 F (36.4 C) (Oral)  Resp 24  Ht 5\' 4"  (1.626 m)  Wt 150 lb (68.04 kg)  BMI 25.73 kg/m2  SpO2 100% Physical Exam  Nursing note and vitals  reviewed. Constitutional: She appears well-developed and well-nourished. No distress.  HENT:  Mouth/Throat: Oropharynx is clear and moist.  Eyes: Conjunctivae are normal. No scleral icterus.  Neck: Neck supple. No tracheal deviation present.  Cardiovascular: Normal rate, regular rhythm, normal heart sounds and intact distal pulses.   Pulmonary/Chest: Effort normal and breath sounds normal. No respiratory distress.  Abdominal: Soft. Normal appearance and bowel sounds are normal. She exhibits no distension and no mass. There is tenderness. There is no rebound and no guarding.   Epigastric tenderness, ruq tenderness, no rebound or guarding. No hernia.   Genitourinary:  No cva tenderness  Musculoskeletal: She exhibits no edema.  Neurological: She is alert.  Skin: Skin is warm and dry. No rash noted. She is not diaphoretic.  Psychiatric: She has a normal mood and affect.    ED Course  Procedures (including critical care time) Labs Review   Results for orders placed during the hospital encounter of 06/22/14  CBC WITH DIFFERENTIAL      Result Value Ref Range   WBC 13.8 (*) 4.0 - 10.5 K/uL   RBC 3.90  3.87 - 5.11 MIL/uL   Hemoglobin 12.2  12.0 - 15.0 g/dL   HCT 37.5  36.0 - 46.0 %   MCV 96.2  78.0 - 100.0 fL   MCH 31.3  26.0 - 34.0 pg   MCHC 32.5  30.0 - 36.0 g/dL   RDW 14.6  11.5 - 15.5 %   Platelets 403 (*) 150 - 400 K/uL   Neutrophils Relative % 79 (*) 43 - 77 %   Neutro Abs 11.0 (*) 1.7 - 7.7 K/uL   Lymphocytes Relative 13  12 - 46 %   Lymphs Abs 1.8  0.7 - 4.0 K/uL   Monocytes Relative 7  3 - 12 %   Monocytes Absolute 1.0  0.1 - 1.0 K/uL   Eosinophils Relative 1  0 - 5 %   Eosinophils Absolute 0.1  0.0 - 0.7 K/uL   Basophils Relative 0  0 - 1 %   Basophils Absolute 0.0  0.0 - 0.1 K/uL  COMPREHENSIVE METABOLIC PANEL      Result Value Ref Range   Sodium 139  137 - 147 mEq/L   Potassium 3.7  3.7 - 5.3 mEq/L   Chloride 101  96 - 112 mEq/L   CO2 25  19 - 32 mEq/L   Glucose, Bld 124 (*) 70 - 99 mg/dL   BUN 20  6 - 23 mg/dL   Creatinine, Ser 0.57  0.50 - 1.10 mg/dL   Calcium 9.0  8.4 - 10.5 mg/dL   Total Protein 6.5  6.0 - 8.3 g/dL   Albumin 3.6  3.5 - 5.2 g/dL   AST 13  0 - 37 U/L   ALT 11  0 - 35 U/L   Alkaline Phosphatase 75  39 - 117 U/L   Total Bilirubin 0.7  0.3 - 1.2 mg/dL   GFR calc non Af Amer 88 (*) >90 mL/min   GFR calc Af Amer >90  >90 mL/min   Anion gap 13  5 - 15  LIPASE, BLOOD      Result Value Ref Range   Lipase 22  11 - 59 U/L  TROPONIN I      Result Value Ref Range   Troponin I <0.30  <0.30 ng/mL   US Abdomen  Complete  06/22/2014   CLINICAL DATA:  Pain, assess for gallstones/ cholecystitis.  EXAM: ULTRASOUND ABDOMEN COMPLETE  COMPARISON:  None.  FINDINGS: Gallbladder:  Gallbladder wall is thickened at 5 mm with trace pericholecystic fluid. 2 cm echogenic gallstone with acoustic shadowing seen. Sonographic Murphy's sign was elicited.  Common bile duct:  Diameter: 4-7 mm  Liver:  17 mm echogenic mass in right lobe of the liver with acoustic enhancement consistent with hemangioma. No intrahepatic mass or biliary dilatation.  IVC:  No abnormality visualized.  Pancreas:  Pancreatic duct is enlarged at 4 mm, pancreas is otherwise unremarkable.  Spleen:  Size and appearance within normal limits.  Right Kidney:  Length: 12.1 cm. Echogenicity within normal limits. No mass or hydronephrosis visualized.  Left Kidney:  Length: 11.4 cm. Echogenicity within normal limits. No mass or hydronephrosis visualized.  Abdominal aorta:  No aneurysm visualized.  Other findings:  None.  IMPRESSION: Cholelithiasis and acute cholecystitis.  Pancreatic duct mild dilatation. Recommend close attention on follow-up imaging.  17 mm hepatic hemangioma, benign.   Electronically Signed   By: Elon Alas   On: 06/22/2014 05:38      EKG Interpretation   Date/Time:  Friday June 22 2014 02:53:00 EDT Ventricular Rate:  79 PR Interval:  149 QRS Duration: 83 QT Interval:  404 QTC Calculation: 463 R Axis:   79 Text Interpretation:  Sinus rhythm Low voltage QRS No significant change  since last tracing Confirmed by Charlie Seda  MD, Lennette Bihari ([id number]) on 06/22/2014  3:10:04 AM      MDM   Iv ns. Dilaudid .5 mg iv. zofran iv.   Labs. U/s.  Reviewed nursing notes and prior charts for additional history.   U/s c/w gallstones and cholecystitis. Zosyn iv.  gen surg called - discussed w Dr Grandville Silos - they will admit.   Recheck pain improved.  Admit.      Mirna Mires, MD 06/22/14 [phone]

## 2014-06-22 NOTE — H&P (Signed)
Pt seen and examined.   I saw the patient, participated in the history, exam and medical decision making, and concur with the physician assistant's note above.  I believe the patient's symptoms are consistent with gallbladder disease.  We discussed gallbladder disease. The patient was given Neurosurgeon. We discussed non-operative and operative management. We discussed the signs & symptoms of acute cholecystitis  I discussed laparoscopic cholecystectomy with IOC in detail.   We discussed the risks and benefits of a laparoscopic cholecystectomy including, but not limited to bleeding, infection, injury to surrounding structures such as the intestine or liver, bile leak, retained gallstones, need to convert to an open procedure, prolonged diarrhea, blood clots such as  DVT, common bile duct injury, anesthesia risks, and possible need for additional procedures.  We discussed the typical post-operative recovery course. I explained that the likelihood of improvement of their symptoms is good.  Valerie Gay. Redmond Pulling, MD, FACS General, Bariatric, & Minimally Invasive Surgery [facility], [place]

## 2014-06-22 NOTE — Progress Notes (Signed)
ANTIBIOTIC CONSULT NOTE - INITIAL  Pharmacy Consult for  Zosyn Indication: cholecystitis  Allergies  Allergen Reactions  . Spiriva Handihaler [Tiotropium Bromide Monohydrate] Other (See Comments)    Itching, rash, hives on upper body    Patient Measurements: Height: 5\' 4"  (162.6 cm) Weight: 150 lb (68.04 kg) IBW/kg (Calculated) : 54.7  Vital Signs: Temp: 97.5 F (36.4 C) (08/07 0015) Temp src: Oral (08/07 0015) BP: 95/71 mmHg (08/07 0948) Pulse Rate: 86 (08/07 0948) Intake/Output from previous day:   Intake/Output from this shift: Total I/O In: 50 [IV Piggyback:50] Out: -   Labs:  Recent Labs  06/22/14 0055  WBC 13.8*  HGB 12.2  PLT 403*  CREATININE 0.57   Estimated Creatinine Clearance: 57.6 ml/min (by C-G formula based on Cr of 0.57). No results found for this basename: VANCOTROUGH, VANCOPEAK, VANCORANDOM, GENTTROUGH, GENTPEAK, GENTRANDOM, TOBRATROUGH, TOBRAPEAK, TOBRARND, AMIKACINPEAK, AMIKACINTROU, AMIKACIN,  in the last 72 hours   Microbiology: No results found for this or any previous visit (from the past 720 hour(s)).  Medical History: Past Medical History  Diagnosis Date  . Diverticulosis   . Colon polyps     2010   . COPD (chronic obstructive pulmonary disease)     Medications:  Anti-infectives   Start     Dose/Rate Route Frequency Ordered Stop   06/22/14 0615  piperacillin-tazobactam (ZOSYN) IVPB 3.375 g     3.375 g 12.5 mL/hr over 240 Minutes Intravenous  Once 06/22/14 2863 06/22/14 0835     Assessment: 73 year old female admitted with cholecystitis to receive empiric antibiotic therapy with Zosyn pending surgery.  She received one dose of Zosyn in the ED this morning.  Her renal function is normal and her Zosyn regimen does not require adjustment.  Plan:  Zosyn 3.375gm IV q8h extended infusion As no dosage adjustments are anticipated pharmacy will sign off.  Thank you for the consult.  Legrand Como, Pharm.D., BCPS, AAHIVP Clinical  Pharmacist Phone: [phone] or [phone] 06/22/2014, 10:17 AM

## 2014-06-22 NOTE — Transfer of Care (Signed)
Immediate Anesthesia Transfer of Care Note  Patient: Valerie Gay  Procedure(s) Performed: Procedure(s): LAPAROSCOPIC CHOLECYSTECTOMY WITH INTRAOPERATIVE CHOLANGIOGRAM (N/A)  Patient Location: PACU  Anesthesia Type:General  Level of Consciousness: awake, alert  and oriented  Airway & Oxygen Therapy: Patient Spontanous Breathing and Patient connected to nasal cannula oxygen  Post-op Assessment: Report given to PACU RN, Post -op Vital signs reviewed and stable and Patient moving all extremities X 4  Post vital signs: Reviewed and stable  Complications: No apparent anesthesia complications

## 2014-06-22 NOTE — ED Notes (Signed)
Witnessed waste of 0.5mg  Dilaudid with Crista Elliot.  Pt name not in the [facility] when this RN went to waste medication

## 2014-06-22 NOTE — Progress Notes (Signed)
Patient arrived back on unit from OR. Received report from Vivien Rota, Therapist, sports. Patient stable.

## 2014-06-22 NOTE — ED Notes (Signed)
Patient transported to Ultrasound 

## 2014-06-22 NOTE — ED Notes (Signed)
Patient here with complaint of upper abdominal pain which began today while riding in car. Endorses some mild back pain and intermittent nausea. States that she has shortness of breath but doesn't feel that it is worse than usual. Denies chest pain.

## 2014-06-22 NOTE — Anesthesia Preprocedure Evaluation (Addendum)
Anesthesia Evaluation  Patient identified by MRN, date of birth, ID band Patient awake    Reviewed: Allergy & Precautions, H&P , NPO status , Patient's Chart, lab work & pertinent test results  Airway Mallampati: II TM Distance: >3 FB Neck ROM: Full    Dental  (+) Dental Advisory Given, Edentulous Upper   Pulmonary COPD COPD inhaler and oxygen dependent, Current Smoker,    + decreased breath sounds      Cardiovascular Rhythm:Regular Rate:Normal     Neuro/Psych PSYCHIATRIC DISORDERS    GI/Hepatic   Endo/Other    Renal/GU      Musculoskeletal   Abdominal   Peds  Hematology   Anesthesia Other Findings   Reproductive/Obstetrics                          Anesthesia Physical Anesthesia Plan  ASA: III  Anesthesia Plan: General   Post-op Pain Management:    Induction: Intravenous  Airway Management Planned: Oral ETT  Additional Equipment:   Intra-op Plan:   Post-operative Plan: Extubation in OR  Informed Consent: I have reviewed the patients History and Physical, chart, labs and discussed the procedure including the risks, benefits and alternatives for the proposed anesthesia with the patient or authorized representative who has indicated his/her understanding and acceptance.   Dental advisory given  Plan Discussed with: CRNA and Anesthesiologist  Anesthesia Plan Comments: (Symptomatic cholelithiasis with nausea and vomiting today COPD on home O2 still smoking Recently treated for pneumonia 6/15  Plan GA with RSI)        Anesthesia Quick Evaluation

## 2014-06-22 NOTE — Op Note (Signed)
Valerie Gay [id number] 06/07/1941 06/22/2014  Laparoscopic Cholecystectomy with IOC Procedure Note  Indications: This patient presents with symptomatic gallbladder disease and will undergo laparoscopic cholecystectomy. Please see H&P for additional details  Pre-operative Diagnosis: Calculus of gallbladder with acute cholecystitis, without mention of obstruction  Post-operative Diagnosis: Same  Surgeon: Gayland Curry   Assistants: Lisette Abu, Surg Tech First Assist - Student  Anesthesia: General endotracheal anesthesia  ASA Class: 3  Procedure Details  The patient was seen again in the Holding Room. The risks, benefits, complications, treatment options, and expected outcomes were discussed with the patient. The possibilities of reaction to medication, pulmonary aspiration, perforation of viscus, bleeding, recurrent infection, finding a normal gallbladder, the need for additional procedures, failure to diagnose a condition, the possible need to convert to an open procedure, and creating a complication requiring transfusion or operation were discussed with the patient. The likelihood of improving the patient's symptoms with return to their baseline status is good.  The patient and/or family concurred with the proposed plan, giving informed consent. The site of surgery properly noted. The patient was taken to Operating Room, identified as Valerie Gay and the procedure verified as Laparoscopic Cholecystectomy with Intraoperative Cholangiogram. A Time Out was held and the above information confirmed. Antibiotic prophylaxis was administered.   Prior to the induction of general anesthesia, antibiotic prophylaxis was administered. General endotracheal anesthesia was then administered and tolerated well. After the induction, the abdomen was prepped with Chloraprep and draped in the sterile fashion. The patient was positioned in the supine position.  Local anesthetic agent was injected  into the skin near the umbilicus and an incision made. We dissected down to the abdominal fascia with blunt dissection.  The fascia was incised vertically and we entered the peritoneal cavity bluntly.  A pursestring suture of 0-Vicryl was placed around the fascial opening.  The Hasson cannula was inserted and secured with the stay suture.  Pneumoperitoneum was then created with CO2 and tolerated well without any adverse changes in the patient's vital signs. An 5-mm port was placed in the subxiphoid position.  Two 5-mm ports were placed in the right upper quadrant. All skin incisions were infiltrated with a local anesthetic agent before making the incision and placing the trocars.   We positioned the patient in reverse Trendelenburg, tilted slightly to the patient's left.  The gallbladder was identified. The gallbladder was very edematous and thick walled and unable to be grasped. The gallbladder was aspirated to allow it to be grasped and retracted. The fundus was grasped and retracted cephalad. There were a significant amount of thickened friable omental adhesions around the body of the gallbladder which were stripped bluntly. There is a fair amount of bloody oozing from this. Adhesions were lysed bluntly and with the electrocautery where indicated, taking care not to injure any adjacent organs or viscus. The infundibulum was grasped and retracted laterally, exposing the peritoneum overlying the triangle of Calot. This was then divided and exposed in a blunt fashion.there was a fair amount of edema in the gallbladder wall. A critical view of the cystic duct and cystic artery was obtained.  The cystic duct was clearly identified and bluntly dissected circumferentially. The cystic duct was ligated with a clip distally.   An incision was made in the cystic duct and the [facility] cholangiogram catheter introduced. The catheter was secured using a clip. A cholangiogram was then obtained which showed good visualization of  the distal and proximal biliary tree with no sign  of obstruction.  Contrast flowed into the duodenum. The catheter was then removed.   The cystic duct was then ligated with clips and divided. The cystic artery which has been identified and dissected free was ligated with clips and divided as well.   The gallbladder was dissected from the liver bed in retrograde fashion with the electrocautery. The gallbladder was removed and placed in an Endocatch sac.  The gallbladder and Endocatch sac were then removed through the umbilical port site. The liver bed was irrigated and inspected. Hemostasis was achieved with the electrocautery. I then inspected the omentum that had been stripped away from the gallbladder and look for any bleeding. There were 1-2 spots which were oozing which were cauterized. Copious irrigation was utilized and was repeatedly aspirated until clear.  The pursestring suture was used to close the umbilical fascia.  An additional 0 Vicryl was placed at the umbilical fascial closure.  We again inspected the right upper quadrant for hemostasis.  The umbilical closure was inspected and there was no air leak and nothing trapped within the closure. Pneumoperitoneum was released as we removed the trocars.  4-0 Monocryl was used to close the skin.   Benzoin, steri-strips, and clean dressings were applied. The patient was then extubated and brought to the recovery room in stable condition. Instrument, sponge, and needle counts were correct at closure and at the conclusion of the case.   Findings: Cholecystitis with Cholelithiasis  Estimated Blood Loss: less than 100 mL         Drains: none         Specimens: Gallbladder           Complications: None; patient tolerated the procedure well.         Disposition: PACU - hemodynamically stable.         Condition: stable  Valerie Gay. Redmond Pulling, MD, FACS General, Bariatric, & Minimally Invasive Surgery [facility], [place]

## 2014-06-22 NOTE — H&P (Signed)
Valerie Gay Apr 01, 1941  [id number].   Primary Care MD: None Primary pulmonologist: Dr. Brand Males Chief Complaint/Reason for Consult: Abdominal pain HPI: This is a 73 year old white female who has moderate to severe COPD and requires 2 L of nasal cannula oxygen at night when sleeping.  She denies shortness of breath when doing her normal ADLs but admits to difficulty when she is in rehabilitation walking laps. She denies chest pain. She sleeps on multiple pillows at night, but this is for comfort not to assist with breathing.  Yesterday around 3:00 in the afternoon, she developed epigastric and right upper quadrant abdominal pain. She denied any nausea or vomiting. Just pain. No change in her bowel habits. Due to persistent pain she presented to [facility] emergency department this morning where she was evaluated and worked up for her abdominal pain. Her ultrasound revealed gallbladder wall thickening and some pericholecystic fluid consistent with acute cholecystitis with cholelithiasis. Her white blood cell count is mildly elevated around 13,000. Her liver function tests are otherwise normal. We have been asked to evaluate the patient for further evaluation and admission.  ROS: Please see history of present illness otherwise all other systems have been reviewed and are negative  No family history on file.  Past Medical History  Diagnosis Date  . Diverticulosis   . Colon polyps     2010   . COPD (chronic obstructive pulmonary disease)     Past Surgical History  Procedure Laterality Date  . Tonsillectomy    . Cataract extraction Right     August 15, 2013    Social History:  reports that she has been smoking Cigarettes.  She has a 30 pack-year smoking history. She does not have any smokeless tobacco history on file. She reports that she does not drink alcohol. Her drug history is not on file.  Allergies:  Allergies  Allergen Reactions  . Spiriva Handihaler [Tiotropium  Bromide Monohydrate] Other (See Comments)    Itching, rash, hives on upper body     (Not in a hospital admission)  Blood pressure 105/67, pulse 79, temperature 97.5 F (36.4 C), temperature source Oral, resp. rate 24, height _0  (1.626 m), weight 150 lb (68.04 kg), SpO2 94.00%. Physical Exam: General: pleasant, WD, WN white female who is laying in bed in NAD HEENT: head is normocephalic, atraumatic.  Sclera are noninjected.  PERRL.  Ears and nose without any masses or lesions.  Mouth is pink and moist Heart: regular, rate, and rhythm.  Normal s1,s2. No obvious murmurs, gallops, or rubs noted.  Palpable radial and pedal pulses bilaterally Lungs: CTAB, no wheezes, rhonchi, or rales noted.  Respiratory effort nonlabored, nasal cannula place with 2 L of present Abd: soft, quite tender in the right upper quadrant with a positive Murphy sign, ND, +BS, no masses, hernias, or organomegaly MS: all 4 extremities are symmetrical with no cyanosis, clubbing, or edema. Skin: warm and dry with no masses, lesions, or rashes Psych: A&Ox3 with an appropriate affect.    Results for orders placed during the hospital encounter of 06/22/14 (from the past 48 hour(s))  CBC WITH DIFFERENTIAL     Status: Abnormal   Collection Time    06/22/14 12:55 AM      Result Value Ref Range   WBC 13.8 (*) 4.0 - 10.5 K/uL   RBC 3.90  3.87 - 5.11 MIL/uL   Hemoglobin 12.2  12.0 - 15.0 g/dL   HCT 37.5  36.0 - 46.0 %   MCV  96.2  78.0 - 100.0 fL   MCH 31.3  26.0 - 34.0 pg   MCHC 32.5  30.0 - 36.0 g/dL   RDW 14.6  11.5 - 15.5 %   Platelets 403 (*) 150 - 400 K/uL   Neutrophils Relative % 79 (*) 43 - 77 %   Neutro Abs 11.0 (*) 1.7 - 7.7 K/uL   Lymphocytes Relative 13  12 - 46 %   Lymphs Abs 1.8  0.7 - 4.0 K/uL   Monocytes Relative 7  3 - 12 %   Monocytes Absolute 1.0  0.1 - 1.0 K/uL   Eosinophils Relative 1  0 - 5 %   Eosinophils Absolute 0.1  0.0 - 0.7 K/uL   Basophils Relative 0  0 - 1 %   Basophils Absolute 0.0   0.0 - 0.1 K/uL  COMPREHENSIVE METABOLIC PANEL     Status: Abnormal   Collection Time    06/22/14 12:55 AM      Result Value Ref Range   Sodium 139  137 - 147 mEq/L   Potassium 3.7  3.7 - 5.3 mEq/L   Chloride 101  96 - 112 mEq/L   CO2 25  19 - 32 mEq/L   Glucose, Bld 124 (*) 70 - 99 mg/dL   BUN 20  6 - 23 mg/dL   Creatinine, Ser 0.57  0.50 - 1.10 mg/dL   Calcium 9.0  8.4 - 10.5 mg/dL   Total Protein 6.5  6.0 - 8.3 g/dL   Albumin 3.6  3.5 - 5.2 g/dL   AST 13  0 - 37 U/L   ALT 11  0 - 35 U/L   Alkaline Phosphatase 75  39 - 117 U/L   Total Bilirubin 0.7  0.3 - 1.2 mg/dL   GFR calc non Af Amer 88 (*) >90 mL/min   GFR calc Af Amer >90  >90 mL/min   Comment: (NOTE)     The eGFR has been calculated using the CKD EPI equation.     This calculation has not been validated in all clinical situations.     eGFR's persistently <90 mL/min signify possible Chronic Kidney     Disease.   Anion gap 13  5 - 15  LIPASE, BLOOD     Status: None   Collection Time    06/22/14  2:54 AM      Result Value Ref Range   Lipase 22  11 - 59 U/L  TROPONIN I     Status: None   Collection Time    06/22/14  2:54 AM      Result Value Ref Range   Troponin I <0.30  <0.30 ng/mL   Comment:            Due to the release kinetics of cTnI,     a negative result within the first hours     of the onset of symptoms does not rule out     myocardial infarction with certainty.     If myocardial infarction is still suspected,     repeat the test at appropriate intervals.   US Abdomen Complete  06/22/2014   CLINICAL DATA:  Pain, assess for gallstones/ cholecystitis.  EXAM: ULTRASOUND ABDOMEN COMPLETE  COMPARISON:  None.  FINDINGS: Gallbladder:  Gallbladder wall is thickened at 5 mm with trace pericholecystic fluid. 2 cm echogenic gallstone with acoustic shadowing seen. Sonographic Murphy's sign was elicited.  Common bile duct:  Diameter: 4-7 mm  Liver:  17 mm echogenic mass  in right lobe of the liver with acoustic  enhancement consistent with hemangioma. No intrahepatic mass or biliary dilatation.  IVC:  No abnormality visualized.  Pancreas:  Pancreatic duct is enlarged at 4 mm, pancreas is otherwise unremarkable.  Spleen:  Size and appearance within normal limits.  Right Kidney:  Length: 12.1 cm. Echogenicity within normal limits. No mass or hydronephrosis visualized.  Left Kidney:  Length: 11.4 cm. Echogenicity within normal limits. No mass or hydronephrosis visualized.  Abdominal aorta:  No aneurysm visualized.  Other findings:  None.  IMPRESSION: Cholelithiasis and acute cholecystitis.  Pancreatic duct mild dilatation. Recommend close attention on follow-up imaging.  17 mm hepatic hemangioma, benign.   Electronically Signed   By: Elon Alas   On: 06/22/2014 05:38       Assessment/Plan 1. Acute calculus cholecystitis 2. Moderate to severe COPD, requires oxygen some night  Plan: 1. We'll get the patient admitted and put her on IV Zosyn for her cholecystitis. She will be kept n.p.o. I have talked to Dr. Chase Caller with the pulmonary service to clear her for surgery. I discussed with the patient that because of her COPD she is higher risk for having difficulties coming off the ventilator after surgery. Her and her son who is present, understands this and is agreeable with this plan. All of her home pulmonary medications have been reordered here.   Valerie Gay 06/22/2014, 8:19 AM Pager: [phone]

## 2014-06-22 NOTE — ED Notes (Signed)
Placed pt on 2 liters of oxygen (nasal cannula) due to complaining of shortness of breath 

## 2014-06-22 NOTE — Anesthesia Postprocedure Evaluation (Signed)
  Anesthesia Post-op Note  Patient: Valerie Gay  Procedure(s) Performed: Procedure(s): LAPAROSCOPIC CHOLECYSTECTOMY WITH INTRAOPERATIVE CHOLANGIOGRAM (N/A)  Patient Location: PACU  Anesthesia Type:General  Level of Consciousness: awake, alert  and oriented  Airway and Oxygen Therapy: Patient Spontanous Breathing and Patient connected to nasal cannula oxygen  Post-op Pain: mild  Post-op Assessment: Post-op Vital signs reviewed, Patient's Cardiovascular Status Stable, Respiratory Function Stable, Patent Airway and Pain level controlled  Post-op Vital Signs: stable  Last Vitals:  Filed Vitals:   06/22/14 1645  BP: 116/42  Pulse: 68  Temp:   Resp: 18    Complications: No apparent anesthesia complications

## 2014-06-22 NOTE — Progress Notes (Signed)
Respiratory assessment done and patient scored a 5. Changed Albuterol from Q6 to PRN. Continuing Pulmicort BID because unable to change. Will continue to monitor and reassess as tolerated.

## 2014-06-22 NOTE — Anesthesia Procedure Notes (Signed)
Procedure Name: Intubation Date/Time: 06/22/2014 2:08 PM Performed by: Carola Frost Pre-anesthesia Checklist: Patient identified, Timeout performed, Emergency Drugs available, Suction available and Patient being monitored Patient Re-evaluated:Patient Re-evaluated prior to inductionOxygen Delivery Method: Circle system utilized Preoxygenation: Pre-oxygenation with 100% oxygen Intubation Type: IV induction, Cricoid Pressure applied and Rapid sequence Ventilation: Mask ventilation without difficulty Laryngoscope Size: Mac and 3 Grade View: Grade II Tube type: Oral Tube size: 7.0 mm Number of attempts: 1 Airway Equipment and Method: Stylet Placement Confirmation: CO2 detector,  positive ETCO2,  ETT inserted through vocal cords under direct vision and breath sounds checked- equal and bilateral Secured at: 21 cm Tube secured with: Tape Dental Injury: Teeth and Oropharynx as per pre-operative assessment

## 2014-06-23 DIAGNOSIS — J449 Chronic obstructive pulmonary disease, unspecified: Secondary | ICD-10-CM | POA: Diagnosis not present

## 2014-06-23 DIAGNOSIS — J9600 Acute respiratory failure, unspecified whether with hypoxia or hypercapnia: Secondary | ICD-10-CM | POA: Diagnosis not present

## 2014-06-23 DIAGNOSIS — K810 Acute cholecystitis: Secondary | ICD-10-CM

## 2014-06-23 DIAGNOSIS — K8000 Calculus of gallbladder with acute cholecystitis without obstruction: Secondary | ICD-10-CM | POA: Diagnosis not present

## 2014-06-23 LAB — CBC
HCT: 34.2 % — ABNORMAL LOW (ref 36.0–46.0)
HEMOGLOBIN: 10.7 g/dL — AB (ref 12.0–15.0)
MCH: 30.5 pg (ref 26.0–34.0)
MCHC: 31.3 g/dL (ref 30.0–36.0)
MCV: 97.4 fL (ref 78.0–100.0)
Platelets: 347 10*3/uL (ref 150–400)
RBC: 3.51 MIL/uL — ABNORMAL LOW (ref 3.87–5.11)
RDW: 14.8 % (ref 11.5–15.5)
WBC: 22.1 10*3/uL — ABNORMAL HIGH (ref 4.0–10.5)

## 2014-06-23 NOTE — Plan of Care (Signed)
Problem: Phase I Progression Outcomes Goal: OOB as tolerated unless otherwise ordered Outcome: Progressing Gets short of breath with exertion. Wheezing.

## 2014-06-23 NOTE — Evaluation (Signed)
Physical Therapy Evaluation Patient Details Name: Valerie Gay MRN: [id number] DOB: May 04, 1941 Today's Date: 06/23/2014   History of Present Illness    73 yo WF life long smoker, currently 1/2 PPD and has a 21 mg nicotine patch on left arm, who presents with 18 hours of abdominal pain and Nausea. CCS evaluated her for intervention for acute cholecystitis and requests a pulmonary consult. She is followed by Dr. Janelle Floor to have stopped smoking but has continued to smoke). Reported to be on nocturnal O2 @ 2lnc but wears it when she feel like she needs it. She is able to ambulate in pulmonary rehabilitation without SOB. Recent PFT and CT scan reveal mild to moderated obstructive disease. She uses symbicort inhaler and albuterol/atrovent nebulizer. Underwend laparoscopic cholecystectomy with intraoperative cholangiogram.    Clinical Impression  Pt admitted with cholecystectomy with his of O2 dependency. Functionally pt is most likely close to baseline.  Will benefit from HHPT safety eval and will need home O2.  Poor endurance overall.  Pt currently with functional limitations due to the deficits listed below (see PT Problem List). Pt will benefit from skilled PT to increase their independence and safety with mobility to allow discharge to the venue listed below.     Follow Up Recommendations Home health PT;Supervision - Intermittent    Equipment Recommendations  None recommended by PT    Recommendations for Other Services       Precautions / Restrictions Precautions Precautions: Fall Restrictions Weight Bearing Restrictions: No      Mobility  Bed Mobility Overal bed mobility: Needs Assistance Bed Mobility: Supine to Sit     Supine to sit: Min guard        Transfers Overall transfer level: Needs assistance Equipment used: Rolling walker (2 wheeled) Transfers: Sit to/from Stand Sit to Stand: Min guard         General transfer comment: Needed cues for hand  placement.   Ambulation/Gait Ambulation/Gait assistance: Min guard;Min assist Ambulation Distance (Feet): 45 Feet Assistive device: Rolling walker (2 wheeled) Gait Pattern/deviations: Step-through pattern;Decreased stride length   Gait velocity interpretation: Below normal speed for age/gender General Gait Details: Pt able to ambulate with RW with fairly good steadiness.  Cues to stay close to RW but pt has 4 wheeled RW at home therefore this may be her baseline and what she is used to.  somewhat SOB and encouraged purse lip breathing.  HAd to incr pt to 3L with activity to keep sats 89-93%.  Needed assist to steer RW.  Pt reports dizziness and states she has hx of vertigo that comes and goes.  BP 115/48 in sitting upon return to room and 107/51 in standing at end of treatment.    Stairs            Wheelchair Mobility    Modified Rankin (Stroke Patients Only)       Balance Overall balance assessment: Needs assistance;History of Falls Sitting-balance support: No upper extremity supported;Feet supported Sitting balance-Leahy Scale: Fair     Standing balance support: Bilateral upper extremity supported;During functional activity Standing balance-Leahy Scale: Fair Standing balance comment: Can stand statically without UE support.               High level balance activites: Turns;Direction changes High Level Balance Comments: No steadying assist needed wtih RW.               Pertinent Vitals/Pain Pain Assessment: 0-10 Pain Score: 5  Pain Location: incision site Pain Descriptors /  Indicators: Aching;Dull Pain Intervention(s): Monitored during session;Repositioned 94% on 2LO2 at rest.  Desat on 2L O2 to 89% on 2L therefore incr O2 to 3L to keep sats >90% with activity.  Placed pt back on 2LO2 upon return to room.  BP sitting 115/48 and standing 107/51.      Home Living Family/patient expects to be discharged to:: Private residence Living Arrangements:  Alone Available Help at Discharge: Family;Available PRN/intermittently Type of Home: House Home Access: Stairs to enter Entrance Stairs-Rails: Can reach both;Left;Right Entrance Stairs-Number of Steps: 3 Home Layout: Able to live on main level with bedroom/bathroom Home Equipment: Shower seat - built in;Walker - 4 wheels (home O2 2L)      Prior Function Level of Independence: Independent               Hand Dominance        Extremity/Trunk Assessment   Upper Extremity Assessment: Defer to OT evaluation           Lower Extremity Assessment: Generalized weakness      Cervical / Trunk Assessment: Normal  Communication   Communication: No difficulties  Cognition Arousal/Alertness: Awake/alert Behavior During Therapy: WFL for tasks assessed/performed Overall Cognitive Status: Within Functional Limits for tasks assessed                      General Comments      Exercises        Assessment/Plan    PT Assessment Patient needs continued PT services  PT Diagnosis Difficulty walking   PT Problem List Decreased activity tolerance;Decreased balance;Decreased mobility;Decreased knowledge of use of DME;Decreased safety awareness;Decreased knowledge of precautions  PT Treatment Interventions DME instruction;Gait training;Functional mobility training;Therapeutic activities;Therapeutic exercise;Balance training;Patient/family education   PT Goals (Current goals can be found in the Care Plan section) Acute Rehab PT Goals Patient Stated Goal: to go home PT Goal Formulation: With patient Time For Goal Achievement: 06/30/14 Potential to Achieve Goals: Good    Frequency Min 3X/week   Barriers to discharge        Co-evaluation               End of Session Equipment Utilized During Treatment: Gait belt;Oxygen Activity Tolerance: Patient limited by fatigue Patient left: in bed;with call bell/phone within reach;with bed alarm set Nurse Communication:  Mobility status    Functional Assessment Tool Used: clinical judgement Functional Limitation: Mobility: Walking and moving around Mobility: Walking and Moving Around Current Status (Q7591): At least 1 percent but less than 20 percent impaired, limited or restricted Mobility: Walking and Moving Around Goal Status [phone]): 0 percent impaired, limited or restricted    Time: 1122-1136 PT Time Calculation (min): 14 min   Charges:   PT Evaluation $Initial PT Evaluation Tier I: 1 Procedure PT Treatments $Gait Training: 8-22 mins   PT G Codes:   Functional Assessment Tool Used: clinical judgement Functional Limitation: Mobility: Walking and moving around    INGOLD,Caili Escalera 06/23/2014, 3:27 PM [facility] [phone] (pager)

## 2014-06-23 NOTE — Progress Notes (Signed)
SATURATION QUALIFICATIONS: (This note is used to comply with regulatory documentation for home oxygen)  Patient Saturations on Room Air at Rest = 94%  Patient Saturations on Room Air while Ambulating = 86-89%  Patient Saturations on  3 Liters of oxygen while Ambulating = 90-93%  Please briefly explain why patient needs home oxygen:Pt desats with activity without O2.  Thanks. [facility] [phone] (pager)

## 2014-06-23 NOTE — Progress Notes (Addendum)
PULMONARY / CRITICAL CARE MEDICINE   Name: Valerie Gay MRN: [id number] DOB: 09/05/1941    ADMISSION DATE:  06/22/2014 CONSULTATION DATE:8/7  REFERRING MD :  [facility]  CHIEF COMPLAINT: ABD pain  INITIAL PRESENTATION: ABD pain x 18 hrs  STUDIES:  8/7 abd [place]: Cholecystitis/cholelithiasis   SIGNIFICANT EVENTS: Scheduled for surgery   HISTORY OF PRESENT ILLNESS:  73 yo WF life long smoker, currently 1/2 PPD and has a 21 mg nicotine patch on left arm, who presented with 18 hours of abdominal pain and  Nausea. [facility]  evaluated her for  intervention for acute cholecystitis and requested a pulmonary consult. She is followed by Dr. Janelle Floor to have stopped smoking but has continued to smoke). Reported to be on nocturnal O2 @ 2lnc but wears it when she feel like she needs it. She is able to ambulate in pulmonary rehabilitation with out SOB. Recent PFT and CT scan reveal mild to moderated obstructive disease. She uses symbicort inhaler and albuterol/atrovent nebulizer. Currently she is in no acute respiratory distress and based on her ADL's she should tolerate surgery. She is of course at greater risk for pulmonary complications due to her disease process and non compliance. PCCM will follow while she is in the hospita  Key events 06/22/14  Laparoscopic Cholecystectomy with IOC     SUBJECTIVE:  No sob/ cough Nausea post op   VITAL SIGNS: Temp:  [97.4 F (36.3 C)-98.2 F (36.8 C)] 97.4 F (36.3 C) (08/08 1412) Pulse Rate:  [66-89] 89 (08/08 1412) Resp:  [14-24] 22 (08/08 1412) BP: (102-129)/(39-55) 129/55 mmHg (08/08 1412) SpO2:  [91 %-98 %] 95 % (08/08 1412) FIO2  2lpm NP   HEMODYNAMICS:   VENTILATOR SETTINGS:   INTAKE / OUTPUT:  Intake/Output Summary (Last 24 hours) at 06/23/14 1619 Last data filed at 06/23/14 1412  Gross per 24 hour  Intake 961.67 ml  Output   1700 ml  Net -738.33 ml    PHYSICAL EXAMINATION: General:  WNWDWF in NAD Neuro: Intact HEENT: No  JVD?LAN Cardiovascular:  HSR RRR Lungs:  decreased bs, no wheeze or rhonchi Abdomen: Diffusely tender/ mod distended  Musculoskeletal:  intact Skin: warm and dry  LABS: PULMONARY No results found for this basename: PHART, PCO2, PCO2ART, PO2, PO2ART, HCO3, TCO2, O2SAT,  in the last 168 hours  CBC  Recent Labs Lab 06/22/14 0055 06/23/14 0546  HGB 12.2 10.7*  HCT 37.5 34.2*  WBC 13.8* 22.1*  PLT 403* 347    COAGULATION No results found for this basename: INR,  in the last 168 hours  CARDIAC    Recent Labs Lab 06/22/14 0254  TROPONINI <0.30   No results found for this basename: PROBNP,  in the last 168 hours   CHEMISTRY  Recent Labs Lab 06/22/14 0055  NA 139  K 3.7  CL 101  CO2 25  GLUCOSE 124*  BUN 20  CREATININE 0.57  CALCIUM 9.0   Estimated Creatinine Clearance: 57.6 ml/min (by C-G formula based on Cr of 0.57).   LIVER  Recent Labs Lab 06/22/14 0055  AST 13  ALT 11  ALKPHOS 75  BILITOT 0.7  PROT 6.5  ALBUMIN 3.6     INFECTIOUS No results found for this basename: LATICACIDVEN, PROCALCITON,  in the last 168 hours   ENDOCRINE CBG (last 3)  No results found for this basename: GLUCAP,  in the last 72 hours       IMAGING x48h Dg Cholangiogram Operative  06/22/2014   CLINICAL DATA:  Cholelithiasis.  EXAM: INTRAOPERATIVE CHOLANGIOGRAM  TECHNIQUE: Cholangiographic images from the C-arm fluoroscopic device were submitted for interpretation post-operatively. Please see the procedural report for the amount of contrast and the fluoroscopy time utilized.  COMPARISON:  Ultrasound 06/22/2014  FINDINGS: Contrast fills the intrahepatic and extrahepatic biliary dilatation. Evidence for some air being injected into the biliary system. There are no persistent filling defects to suggest biliary stones. Contrast drains into the duodenum.  IMPRESSION: Patent biliary system without evidence of stones.   Electronically Signed   By: Markus Daft M.D.   On:  06/22/2014 15:29   US Abdomen Complete  06/22/2014   CLINICAL DATA:  Pain, assess for gallstones/ cholecystitis.  EXAM: ULTRASOUND ABDOMEN COMPLETE  COMPARISON:  None.  FINDINGS: Gallbladder:  Gallbladder wall is thickened at 5 mm with trace pericholecystic fluid. 2 cm echogenic gallstone with acoustic shadowing seen. Sonographic Murphy's sign was elicited.  Common bile duct:  Diameter: 4-7 mm  Liver:  17 mm echogenic mass in right lobe of the liver with acoustic enhancement consistent with hemangioma. No intrahepatic mass or biliary dilatation.  IVC:  No abnormality visualized.  Pancreas:  Pancreatic duct is enlarged at 4 mm, pancreas is otherwise unremarkable.  Spleen:  Size and appearance within normal limits.  Right Kidney:  Length: 12.1 cm. Echogenicity within normal limits. No mass or hydronephrosis visualized.  Left Kidney:  Length: 11.4 cm. Echogenicity within normal limits. No mass or hydronephrosis visualized.  Abdominal aorta:  No aneurysm visualized.  Other findings:  None.  IMPRESSION: Cholelithiasis and acute cholecystitis.  Pancreatic duct mild dilatation. Recommend close attention on follow-up imaging.  17 mm hepatic hemangioma, benign.   Electronically Signed   By: Elon Alas   On: 06/22/2014 05:38       ASSESSMENT / PLAN:  PULMONARY   A: COPD with O 2 dependency, continued tobacco abuse. P:   O2 as needed DC Nicoderm patch till after surgery BD's via nebs till after surgery IS and flutter valve  CARDIOVASCULAR CVL A:  No acute issue     GASTROINTESTINAL A:   Cholecystitis/ nausea post op  P:   PER [facility]  HEMATOLOGIC A:  No acute issue P:    INFECTIOUS A:   Cholecystitis  P:    Abx: pip-tazo,   8/7 > 8/8   ENDOCRINE A:     No acute issue P:     NEUROLOGIC A:   No acute issue    Christinia Gully, MD Pulmonary and [place] [phone] After 5:30 PM or weekends, call [phone]    06/23/2014 4:19  PM

## 2014-06-23 NOTE — Progress Notes (Signed)
1 Day Post-Op  Subjective: PT FELT  A LITTLE DIZZY WHEN SHE GOT UP NO NAUSEA OR VOMITING  Objective: Vital signs in last 24 hours: Temp:  [97.5 F (36.4 C)-98.2 F (36.8 C)] 97.6 F (36.4 C) (08/08 0553) Pulse Rate:  [66-90] 73 (08/08 0553) Resp:  [14-24] 15 (08/08 0553) BP: (95-117)/(32-71) 102/43 mmHg (08/08 0553) SpO2:  [91 %-99 %] 91 % (08/08 0824) Weight:  [150 lb (68.04 kg)] 150 lb (68.04 kg) (08/07 1036) Last BM Date: 06/21/14  Intake/Output from previous day: 08/07 0701 - 08/08 0700 In: 2191.7 [I.V.:2141.7; IV Piggyback:50] Out: 1100 [Urine:950; Blood:150] Intake/Output this shift: Total I/O In: -  Out: 200 [Urine:200]  Resp: clear to auscultation bilaterally Cardio: regular rate and rhythm, S1, S2 normal, no murmur, click, rub or gallop Incision/Wound:SOFT NO PERITONITIS  INCISIONS CLEAN     Lab Results:   Recent Labs  06/22/14 0055 06/23/14 0546  WBC 13.8* 22.1*  HGB 12.2 10.7*  HCT 37.5 34.2*  PLT 403* 347   BMET  Recent Labs  06/22/14 0055  NA 139  K 3.7  CL 101  CO2 25  GLUCOSE 124*  BUN 20  CREATININE 0.57  CALCIUM 9.0   PT/INR No results found for this basename: LABPROT, INR,  in the last 72 hours ABG No results found for this basename: PHART, PCO2, PO2, HCO3,  in the last 72 hours  Studies/Results: Dg Cholangiogram Operative  06/22/2014   CLINICAL DATA:  Cholelithiasis.  EXAM: INTRAOPERATIVE CHOLANGIOGRAM  TECHNIQUE: Cholangiographic images from the C-arm fluoroscopic device were submitted for interpretation post-operatively. Please see the procedural report for the amount of contrast and the fluoroscopy time utilized.  COMPARISON:  Ultrasound 06/22/2014  FINDINGS: Contrast fills the intrahepatic and extrahepatic biliary dilatation. Evidence for some air being injected into the biliary system. There are no persistent filling defects to suggest biliary stones. Contrast drains into the duodenum.  IMPRESSION: Patent biliary system without  evidence of stones.   Electronically Signed   By: Markus Daft M.D.   On: 06/22/2014 15:29   US Abdomen Complete  06/22/2014   CLINICAL DATA:  Pain, assess for gallstones/ cholecystitis.  EXAM: ULTRASOUND ABDOMEN COMPLETE  COMPARISON:  None.  FINDINGS: Gallbladder:  Gallbladder wall is thickened at 5 mm with trace pericholecystic fluid. 2 cm echogenic gallstone with acoustic shadowing seen. Sonographic Murphy's sign was elicited.  Common bile duct:  Diameter: 4-7 mm  Liver:  17 mm echogenic mass in right lobe of the liver with acoustic enhancement consistent with hemangioma. No intrahepatic mass or biliary dilatation.  IVC:  No abnormality visualized.  Pancreas:  Pancreatic duct is enlarged at 4 mm, pancreas is otherwise unremarkable.  Spleen:  Size and appearance within normal limits.  Right Kidney:  Length: 12.1 cm. Echogenicity within normal limits. No mass or hydronephrosis visualized.  Left Kidney:  Length: 11.4 cm. Echogenicity within normal limits. No mass or hydronephrosis visualized.  Abdominal aorta:  No aneurysm visualized.  Other findings:  None.  IMPRESSION: Cholelithiasis and acute cholecystitis.  Pancreatic duct mild dilatation. Recommend close attention on follow-up imaging.  17 mm hepatic hemangioma, benign.   Electronically Signed   By: Elon Alas   On: 06/22/2014 05:38    Anti-infectives: Anti-infectives   Start     Dose/Rate Route Frequency Ordered Stop   06/22/14 1400  piperacillin-tazobactam (ZOSYN) IVPB 3.375 g  Status:  Discontinued     3.375 g 12.5 mL/hr over 240 Minutes Intravenous 3 times per day 06/22/14 1018 06/22/14  1758   06/22/14 1345  piperacillin-tazobactam (ZOSYN) IVPB 3.375 g     3.375 g 100 mL/hr over 30 Minutes Intravenous STAT 06/22/14 1339 06/23/14 1345   06/22/14 0615  piperacillin-tazobactam (ZOSYN) IVPB 3.375 g     3.375 g 12.5 mL/hr over 240 Minutes Intravenous  Once 06/22/14 0605 06/22/14 0835      Assessment/Plan: s/p Procedure(s): LAPAROSCOPIC  CHOLECYSTECTOMY WITH INTRAOPERATIVE CHOLANGIOGRAM (N/A) LEUKOCYTOSIS REACTIVE  ?   RECHECK IN AM NEEDS PT EVALUATION  WANTS TO GO HOME WITH SON ADV DIET DECREASE IVF DVT PREVENTION    LOS: 1 day    Antavia Tandy A. 06/23/2014

## 2014-06-24 ENCOUNTER — Observation Stay (HOSPITAL_COMMUNITY): Payer: Medicare Other

## 2014-06-24 DIAGNOSIS — J9600 Acute respiratory failure, unspecified whether with hypoxia or hypercapnia: Secondary | ICD-10-CM | POA: Diagnosis not present

## 2014-06-24 DIAGNOSIS — K8000 Calculus of gallbladder with acute cholecystitis without obstruction: Secondary | ICD-10-CM | POA: Diagnosis not present

## 2014-06-24 DIAGNOSIS — J449 Chronic obstructive pulmonary disease, unspecified: Secondary | ICD-10-CM | POA: Diagnosis not present

## 2014-06-24 DIAGNOSIS — R0602 Shortness of breath: Secondary | ICD-10-CM | POA: Diagnosis not present

## 2014-06-24 LAB — COMPREHENSIVE METABOLIC PANEL
ALT: 85 U/L — ABNORMAL HIGH (ref 0–35)
AST: 47 U/L — AB (ref 0–37)
Albumin: 2.6 g/dL — ABNORMAL LOW (ref 3.5–5.2)
Alkaline Phosphatase: 101 U/L (ref 39–117)
Anion gap: 11 (ref 5–15)
BUN: 17 mg/dL (ref 6–23)
CALCIUM: 8.7 mg/dL (ref 8.4–10.5)
CO2: 21 mEq/L (ref 19–32)
CREATININE: 0.48 mg/dL — AB (ref 0.50–1.10)
Chloride: 108 mEq/L (ref 96–112)
GFR calc Af Amer: >90 mL/min (ref >90)
Glucose, Bld: 118 mg/dL — ABNORMAL HIGH (ref 70–99)
Potassium: 5.2 mEq/L (ref 3.7–5.3)
Sodium: 140 mEq/L (ref 137–147)
TOTAL PROTEIN: 5.9 g/dL — AB (ref 6.0–8.3)
Total Bilirubin: 0.4 mg/dL (ref 0.3–1.2)

## 2014-06-24 LAB — CBC
HCT: 34.7 % — ABNORMAL LOW (ref 36.0–46.0)
Hemoglobin: 11.5 g/dL — ABNORMAL LOW (ref 12.0–15.0)
MCH: 32.7 pg (ref 26.0–34.0)
MCHC: 33.1 g/dL (ref 30.0–36.0)
MCV: 98.6 fL (ref 78.0–100.0)
Platelets: 312 10*3/uL (ref 150–400)
RBC: 3.52 MIL/uL — ABNORMAL LOW (ref 3.87–5.11)
RDW: 15.7 % — AB (ref 11.5–15.5)
WBC: 19.1 10*3/uL — ABNORMAL HIGH (ref 4.0–10.5)

## 2014-06-24 MED ORDER — HYDROCORTISONE 1 % EX CREA
TOPICAL_CREAM | Freq: Three times a day (TID) | CUTANEOUS | Status: DC
  Filled 2014-06-24: qty 28

## 2014-06-24 MED ORDER — SODIUM CHLORIDE 0.9 % IJ SOLN: 3.0000 mL | INTRAMUSCULAR | Status: DC | PRN

## 2014-06-24 MED ORDER — SODIUM CHLORIDE 0.9 % IV SOLN: 250.0000 mL | INTRAVENOUS | Status: DC | PRN

## 2014-06-24 MED ORDER — SODIUM CHLORIDE 0.9 % IJ SOLN
3.0000 mL | Freq: Two times a day (BID) | INTRAMUSCULAR | Status: DC
  Administered 2014-06-24 (×2): 3 mL via INTRAVENOUS

## 2014-06-24 NOTE — Progress Notes (Signed)
Patient's daughter came out to say that patient has been itching prior to admission here. Her arms and legs itch. Wonders if it may the inhalers. Asked patient if it is okay to discuss in front of family members present. She says it is fine.

## 2014-06-24 NOTE — Progress Notes (Signed)
Call placed to [facility] Answering Service to have Dr. Hulen Skains call me regarding patient itching and not wanting to be drowsy from [place]. Order obtained for either Caladryl or Hydrocortisone 1% Cream.

## 2014-06-24 NOTE — Progress Notes (Signed)
PULMONARY / CRITICAL CARE MEDICINE   Name: Valerie Gay MRN: [id number] DOB: [birth year]    ADMISSION DATE:  06/22/2014 CONSULTATION DATE:8/7  REFERRING MD :  [facility]  CHIEF COMPLAINT: ABD pain  INITIAL PRESENTATION: ABD pain x 18 hrs  STUDIES:  8/7 abd [place]: Cholecystitis/cholelithiasis   SIGNIFICANT EVENTS:  06/22/14  Laparoscopic Cholecystectomy with IOC      HISTORY OF PRESENT ILLNESS:  73 yo WF life long smoker, currently 1/2 PPD and has a 21 mg nicotine patch on left arm, who presented with 18 hours of abdominal pain and  Nausea. [facility]  evaluated her for  intervention for acute cholecystitis and requested a pulmonary consult. She is followed by Dr. Janelle Floor to have stopped smoking but has continued to smoke). Reported to be on nocturnal O2 @ 2lnc but wears it when she feel like she needs it. She is able to ambulate in pulmonary rehabilitation with out SOB. Recent PFT and CT scan reveal mild to moderated obstructive disease. She uses symbicort inhaler and albuterol/atrovent nebulizer. Currently she is in no acute respiratory distress and based on her ADL's she should tolerate surgery. She is of course at greater risk for pulmonary complications due to her disease process and non compliance. PCCM will follow while she is in the hospital      SUBJECTIVE:  No sob/ cough on 02  Nausea post op  Improved   VITAL SIGNS: Temp:  [97.4 F (36.3 C)-97.6 F (36.4 C)] 97.6 F (36.4 C) (08/09 0615) Pulse Rate:  [72-97] 72 (08/09 0615) Resp:  [18-22] 18 (08/09 0615) BP: (124-139)/(53-67) 124/53 mmHg (08/09 0615) SpO2:  [95 %-98 %] 97 % (08/09 0852) FIO2  2lpm NP   HEMODYNAMICS:   VENTILATOR SETTINGS:   INTAKE / OUTPUT:  Intake/Output Summary (Last 24 hours) at 06/24/14 1327 Last data filed at 06/24/14 0929  Gross per 24 hour  Intake   1785 ml  Output   1300 ml  Net    485 ml    PHYSICAL EXAMINATION: General:  WNWDWF in NAD Neuro: Intact HEENT: No  JVD?LAN Cardiovascular:  HSR RRR Lungs:  decreased bs, no wheeze or rhonchi/ increased Tex[ Abdomen: Diffusely min  tender/ mildly istended  Musculoskeletal:  intact Skin: warm and dry  LABS: PULMONARY No results found for this basename: PHART, PCO2, PCO2ART, PO2, PO2ART, HCO3, TCO2, O2SAT,  in the last 168 hours  CBC  Recent Labs Lab 06/22/14 0055 06/23/14 0546 06/24/14 0520  HGB 12.2 10.7* 11.5*  HCT 37.5 34.2* 34.7*  WBC 13.8* 22.1* 19.1*  PLT 403* 347 312    COAGULATION No results found for this basename: INR,  in the last 168 hours  CARDIAC    Recent Labs Lab 06/22/14 0254  TROPONINI <0.30   No results found for this basename: PROBNP,  in the last 168 hours   CHEMISTRY  Recent Labs Lab 06/22/14 0055 06/24/14 0520  NA 139 140  K 3.7 5.2  CL 101 108  CO2 25 21  GLUCOSE 124* 118*  BUN 20 17  CREATININE 0.57 0.48*  CALCIUM 9.0 8.7   Estimated Creatinine Clearance: 57.6 ml/min (by C-G formula based on Cr of 0.48).   LIVER  Recent Labs Lab 06/22/14 0055 06/24/14 0520  AST 13 47*  ALT 11 85*  ALKPHOS 75 101  BILITOT 0.7 0.4  PROT 6.5 5.9*  ALBUMIN 3.6 2.6*     INFECTIOUS No results found for this basename: LATICACIDVEN, PROCALCITON,  in the last 168 hours  ENDOCRINE CBG (last 3)  No results found for this basename: GLUCAP,  in the last 72 hours       IMAGING x48h No results found.     ASSESSMENT / PLAN:  PULMONARY   A: COPD with O 2 dependency, continued tobacco abuse. P:   O2 as needed BD's via nebs till after surgery IS and flutter valve  CARDIOVASCULAR CVL A:  No acute issue     GASTROINTESTINAL A:   Cholecystitis/ nausea post op  P:   PER [facility]  HEMATOLOGIC A:  No acute issue P:    INFECTIOUS A:   Cholecystitis  P:    Abx: pip-tazo,   8/7 > 8/8   ENDOCRINE A:     No acute issue P:     NEUROLOGIC A:   No acute issue   Ok for discharge from our perspective - key is no smoking    Christinia Gully, MD Pulmonary and [place] [phone] After 5:30 PM or weekends, call [phone]    06/24/2014 1:27 PM

## 2014-06-24 NOTE — Progress Notes (Signed)
2 Days Post-Op  Subjective: Pt with poor appetite  Breathing is good.  No significant abdominal pain.  No nausea.   Objective: Vital signs in last 24 hours: Temp:  [97.4 F (36.3 C)-97.6 F (36.4 C)] 97.6 F (36.4 C) (08/09 0615) Pulse Rate:  [72-97] 72 (08/09 0615) Resp:  [18-22] 18 (08/09 0615) BP: (102-139)/(40-67) 124/53 mmHg (08/09 0615) SpO2:  [93 %-98 %] 97 % (08/09 0852) Last BM Date: 06/21/14  Intake/Output from previous day: 08/08 0701 - 08/09 0700 In: 0370 [P.O.:120; I.V.:1425] Out: 1550 [Urine:1550] Intake/Output this shift:    Resp: clear to auscultation bilaterally Incision/Wound:soft ND WOUNDS OK  Lab Results:   Recent Labs  06/23/14 0546 06/24/14 0520  WBC 22.1* 19.1*  HGB 10.7* 11.5*  HCT 34.2* 34.7*  PLT 347 312   BMET  Recent Labs  06/22/14 0055 06/24/14 0520  NA 139 140  K 3.7 5.2  CL 101 108  CO2 25 21  GLUCOSE 124* 118*  BUN 20 17  CREATININE 0.57 0.48*  CALCIUM 9.0 8.7   PT/INR No results found for this basename: LABPROT, INR,  in the last 72 hours ABG No results found for this basename: PHART, PCO2, PO2, HCO3,  in the last 72 hours  Studies/Results: Dg Cholangiogram Operative  06/22/2014   CLINICAL DATA:  Cholelithiasis.  EXAM: INTRAOPERATIVE CHOLANGIOGRAM  TECHNIQUE: Cholangiographic images from the C-arm fluoroscopic device were submitted for interpretation post-operatively. Please see the procedural report for the amount of contrast and the fluoroscopy time utilized.  COMPARISON:  Ultrasound 06/22/2014  FINDINGS: Contrast fills the intrahepatic and extrahepatic biliary dilatation. Evidence for some air being injected into the biliary system. There are no persistent filling defects to suggest biliary stones. Contrast drains into the duodenum.  IMPRESSION: Patent biliary system without evidence of stones.   Electronically Signed   By: Markus Daft M.D.   On: 06/22/2014 15:29    Anti-infectives: Anti-infectives   Start     Dose/Rate  Route Frequency Ordered Stop   06/22/14 1400  piperacillin-tazobactam (ZOSYN) IVPB 3.375 g  Status:  Discontinued     3.375 g 12.5 mL/hr over 240 Minutes Intravenous 3 times per day 06/22/14 1018 06/22/14 1758   06/22/14 1345  piperacillin-tazobactam (ZOSYN) IVPB 3.375 g     3.375 g 100 mL/hr over 30 Minutes Intravenous STAT 06/22/14 1339 06/23/14 1345   06/22/14 0615  piperacillin-tazobactam (ZOSYN) IVPB 3.375 g     3.375 g 12.5 mL/hr over 240 Minutes Intravenous  Once 06/22/14 0605 06/22/14 0835      Assessment/Plan: s/p Procedure(s): LAPAROSCOPIC CHOLECYSTECTOMY WITH INTRAOPERATIVE CHOLANGIOGRAM (N/A) Leukocytosis check CXR and repeat CBC in am. No abdominal pain or dysuria Not eating much will decrease IVF and see how she does Pulmonary toilet  LOS: 2 days    Myeesha Shane A. 06/24/2014

## 2014-06-25 ENCOUNTER — Encounter (HOSPITAL_COMMUNITY): Payer: Self-pay | Admitting: General Surgery

## 2014-06-25 DIAGNOSIS — J449 Chronic obstructive pulmonary disease, unspecified: Secondary | ICD-10-CM | POA: Diagnosis not present

## 2014-06-25 DIAGNOSIS — K8000 Calculus of gallbladder with acute cholecystitis without obstruction: Secondary | ICD-10-CM | POA: Diagnosis not present

## 2014-06-25 DIAGNOSIS — J189 Pneumonia, unspecified organism: Secondary | ICD-10-CM | POA: Diagnosis not present

## 2014-06-25 DIAGNOSIS — J9600 Acute respiratory failure, unspecified whether with hypoxia or hypercapnia: Secondary | ICD-10-CM | POA: Diagnosis not present

## 2014-06-25 DIAGNOSIS — K810 Acute cholecystitis: Secondary | ICD-10-CM | POA: Diagnosis not present

## 2014-06-25 LAB — CBC
HCT: 32.9 % — ABNORMAL LOW (ref 36.0–46.0)
HEMOGLOBIN: 10.4 g/dL — AB (ref 12.0–15.0)
MCH: 31.1 pg (ref 26.0–34.0)
MCHC: 31.6 g/dL (ref 30.0–36.0)
MCV: 98.5 fL (ref 78.0–100.0)
PLATELETS: 392 10*3/uL (ref 150–400)
RBC: 3.34 MIL/uL — AB (ref 3.87–5.11)
RDW: 15.2 % (ref 11.5–15.5)
WBC: 9.9 10*3/uL (ref 4.0–10.5)

## 2014-06-25 MED ORDER — BISACODYL 10 MG RE SUPP: 10.0000 mg | Freq: Every day | RECTAL | Status: DC | PRN

## 2014-06-25 MED ORDER — OXYCODONE HCL 5 MG PO TABS: 5.0000 mg | ORAL_TABLET | Freq: Four times a day (QID) | ORAL | Status: DC | PRN

## 2014-06-25 MED ORDER — DSS 100 MG PO CAPS: 100.0000 mg | ORAL_CAPSULE | Freq: Two times a day (BID) | ORAL | Status: DC | PRN

## 2014-06-25 MED ORDER — BISACODYL 10 MG RE SUPP: 10.0000 mg | Freq: Once | RECTAL | Status: DC

## 2014-06-25 NOTE — Progress Notes (Signed)
Agree with above 

## 2014-06-25 NOTE — Discharge Instructions (Signed)
Your appointment is at 1:45pm, please arrive at least 30 min before your appointment to complete your check in paperwork.  If you are unable to arrive 30 min prior to your appointment time we may have to cancel or reschedule you.  LAPAROSCOPIC SURGERY: POST OP INSTRUCTIONS  1. DIET: Follow a light bland diet the first 24 hours after arrival home, such as soup, liquids, crackers, etc. Be sure to include lots of fluids daily. Avoid fast food or heavy meals as your are more likely to get nauseated. Eat a low fat the next few days after surgery.  2. Take your usually prescribed home medications unless otherwise directed. 3. PAIN CONTROL:  a. Pain is best controlled by a usual combination of three different methods TOGETHER:  i. Ice/Heat ii. Over the counter pain medication iii. Prescription pain medication b. Most patients will experience some swelling and bruising around the incisions. Ice packs or heating pads (30-60 minutes up to 6 times a day) will help. Use ice for the first few days to help decrease swelling and bruising, then switch to heat to help relax tight/sore spots and speed recovery. Some people prefer to use ice alone, heat alone, alternating between ice & heat. Experiment to what works for you. Swelling and bruising can take several weeks to resolve.  c. It is helpful to take an over-the-counter pain medication regularly for the first few weeks. Choose one of the following that works best for you:  i. Naproxen (Aleve, etc) Two 220mg  tabs twice a day ii. Ibuprofen (Advil, etc) Three 200mg  tabs four times a day (every meal & bedtime) iii. Acetaminophen (Tylenol, etc) 500-650mg  four times a day (every meal & bedtime) d. A prescription for pain medication (such as oxycodone, hydrocodone, etc) should be given to you upon discharge. Take your pain medication as prescribed.  i. If you are having problems/concerns with the prescription medicine (does not control pain, nausea, vomiting, rash,  itching, etc), please call us [phone] to see if we need to switch you to a different pain medicine that will work better for you and/or control your side effect better. ii. If you need a refill on your pain medication, please contact your pharmacy. They will contact our office to request authorization. Prescriptions will not be filled after 5 pm or on week-ends. 4. Avoid getting constipated. Between the surgery and the pain medications, it is common to experience some constipation. Increasing fluid intake and taking a fiber supplement (such as Metamucil, Citrucel, FiberCon, MiraLax, etc) 1-2 times a day regularly will usually help prevent this problem from occurring. A mild laxative (prune juice, Milk of Magnesia, MiraLax, etc) should be taken according to package directions if there are no bowel movements after 48 hours.  5. Watch out for diarrhea. If you have many loose bowel movements, simplify your diet to bland foods & liquids for a few days. Stop any stool softeners and decrease your fiber supplement. Switching to mild anti-diarrheal medications (Kayopectate, Pepto Bismol) can help. If this worsens or does not improve, please call us. 6. Wash / shower every day. You may shower over the dressings as they are waterproof. Continue to shower over incision(s) after the dressing is off. 7. Remove your waterproof bandages 5 days after surgery. You may leave the incision open to air. You may replace a dressing/Band-Aid to cover the incision for comfort if you wish.  8. ACTIVITIES as tolerated:  a. You may resume regular (light) daily activities beginning the next day--such as  daily self-care, walking, climbing stairs--gradually increasing activities as tolerated. If you can walk 30 minutes without difficulty, it is safe to try more intense activity such as jogging, treadmill, bicycling, low-impact aerobics, swimming, etc. b. Save the most intensive and strenuous activity for last such as sit-ups, heavy  lifting, contact sports, etc Refrain from any heavy lifting or straining until you are off narcotics for pain control.  c. DO NOT PUSH THROUGH PAIN. Let pain be your guide: If it hurts to do something, don't do it. Pain is your body warning you to avoid that activity for another week until the pain goes down. d. You may drive when you are no longer taking prescription pain medication, you can comfortably wear a seatbelt, and you can safely maneuver your car and apply brakes. e. Dennis Bast may have sexual intercourse when it is comfortable.  9. FOLLOW UP in our office  a. Please call [facility] at [phone] to set up an appointment to see your surgeon in the office for a follow-up appointment approximately 2-3 weeks after your surgery. b. Make sure that you call for this appointment the day you arrive home to insure a convenient appointment time.      10. IF YOU HAVE DISABILITY OR FAMILY LEAVE FORMS, BRING THEM TO THE               OFFICE FOR PROCESSING.   WHEN TO CALL us [phone]:  1. Poor pain control 2. Reactions / problems with new medications (rash/itching, nausea, etc)  3. Fever over 101.5 F (38.5 C) 4. Inability to urinate 5. Nausea and/or vomiting 6. Worsening swelling or bruising 7. Continued bleeding from incision. 8. Increased pain, redness, or drainage from the incision  The clinic staff is available to answer your questions during regular business hours (8:30am-5pm). Please dont hesitate to call and ask to speak to one of our nurses for clinical concerns.  If you have a medical emergency, go to the nearest emergency room or call 911.  A surgeon from [facility] is always on call at the [facility], [place], [place], [place], [place] [address] ?  MAIN: [phone] ? TOLL FREE: [phone] ?  FAX [phone]  [email or web address]

## 2014-06-25 NOTE — Progress Notes (Addendum)
PULMONARY / CRITICAL CARE MEDICINE   Name: Valerie Gay MRN: [id number] DOB: October 28, 1941    ADMISSION DATE:  06/22/2014 CONSULTATION DATE:8/7  REFERRING MD :  [facility]S  CHIEF COMPLAINT: ABD pain  INITIAL PRESENTATION: ABD pain x 18 hrs  STUDIES:  8/7 abd [place]: Cholecystitis/cholelithiasis   SIGNIFICANT EVENTS:  06/22/14  Laparoscopic Cholecystectomy with IOC      HISTORY OF PRESENT ILLNESS:  73 yo WF life long smoker, currently 1/2 PPD and has a 21 mg nicotine patch on left arm, who was admitted w/ acute cholecystitis. Followed by Dr. Janelle Floor to have stopped smoking but has continued to smoke). Reported to be on nocturnal O2 @ 2lnc but wears it when she feel like she needs it. PCCM asked to see for pulm clearance    SUBJECTIVE:  No sob/ cough on 02  Nausea post op  Improved   VITAL SIGNS: Temp:  [97.6 F (36.4 C)-98.2 F (36.8 C)] 98.2 F (36.8 C) (08/10 0636) Pulse Rate:  [76-82] 76 (08/10 0636) Resp:  [16-18] 18 (08/10 0636) BP: (107-123)/(47-53) 123/52 mmHg (08/10 0636) SpO2:  [94 %-98 %] 95 % (08/10 0636) FIO2  2lpm NP     INTAKE / OUTPUT:  Intake/Output Summary (Last 24 hours) at 06/25/14 0904 Last data filed at 06/25/14 0902  Gross per 24 hour  Intake   1040 ml  Output    950 ml  Net     90 ml    PHYSICAL EXAMINATION: General:  WNWDWF in NAD Neuro: Intact HEENT: No JVD Cardiovascular:  HSR RRR Lungs:  decreased bs, no wheeze or rhonchi Abdomen: Diffusely min  tender/ mildly istended  Musculoskeletal:  intact Skin: warm and dry  LABS: PULMONARY No results found for this basename: PHART, PCO2, PCO2ART, PO2, PO2ART, HCO3, TCO2, O2SAT,  in the last 168 hours  CBC  Recent Labs Lab 06/23/14 0546 06/24/14 0520 06/25/14 0357  HGB 10.7* 11.5* 10.4*  HCT 34.2* 34.7* 32.9*  WBC 22.1* 19.1* 9.9  PLT 347 312 392    COAGULATION No results found for this basename: INR,  in the last 168 hours    CHEMISTRY  Recent Labs Lab 06/22/14 0055  06/24/14 0520  NA 139 140  K 3.7 5.2  CL 101 108  CO2 25 21  GLUCOSE 124* 118*  BUN 20 17  CREATININE 0.57 0.48*  CALCIUM 9.0 8.7   Estimated Creatinine Clearance: 57.6 ml/min (by C-G formula based on Cr of 0.48).   LIVER  Recent Labs Lab 06/22/14 0055 06/24/14 0520  AST 13 47*  ALT 11 85*  ALKPHOS 75 101  BILITOT 0.7 0.4  PROT 6.5 5.9*  ALBUMIN 3.6 2.6*      IMAGING x48h Dg Chest Port 1 View  06/24/2014   CLINICAL DATA:  73 year old female with shortness of breath.  EXAM: PORTABLE CHEST - 1 VIEW  COMPARISON:  05/07/2014 and prior chest radiographs  FINDINGS: The cardiomediastinal silhouette is unremarkable.  There is no evidence of focal airspace disease, pulmonary edema, suspicious pulmonary nodule/mass, pleural effusion, or pneumothorax. No acute bony abnormalities are identified.  IMPRESSION: No active disease.   Electronically Signed   By: Hassan Rowan M.D.   On: 06/24/2014 10:49       ASSESSMENT / PLAN: S/p Lap Chole for acute Cholecystitis Plan Per surgical services  COPD with O 2 dependency, continued tobacco abuse. P:   No smoking!  Have asked smoking cessation nurse to see prior to d/c Home on 2 liters O2 Resume  home BDs (symbicort and PRN albuterol) Ramaswamy Aug 24 at 10am  We will s/o ok for d/c from our stand-point.   Patient seen and examined, agree with above note.  I dictated the care and orders written for this patient under my direction.  Rush Farmer, MD [phone]  06/25/2014 9:04 AM

## 2014-06-25 NOTE — Progress Notes (Signed)
Physical Therapy Treatment Patient Details Name: Valerie Gay MRN: [id number] DOB: 07-09-1941 Today's Date: 06/25/2014    History of Present Illness 73 yo WF life long smoker, currently 1/2 PPD and has a 21 mg nicotine patch on left arm, who presents with 18 hours of abdominal pain and Nausea. CCS evaluated her for intervention for acute cholecystitis and requests a pulmonary consult. She is followed by Dr. Janelle Floor to have stopped smoking but has continued to smoke). Reported to be on nocturnal O2 @ 2lnc but wears it when she feel like she needs it. She is able to ambulate in pulmonary rehabilitation without SOB. Recent PFT and CT scan reveal mild to moderated obstructive disease. She uses symbicort inhaler and albuterol/atrovent nebulizer. Underwend laparoscopic cholecystectomy with intraoperative cholangiogram.      PT Comments    Pt progressing well towards all goals and was able to complete stair negotiation with minimal assist this date. Pt reports son to be staying with her for the next week. Pt con't to have + SOB with ambulation however pt on 2LO2 at baseline with baseline SOB with activity. Pt reports SOB mildly heightened compared to baseline, however suspect this to be due to surgery. Pt safe to d/c home once medically ready.  Follow Up Recommendations  Home health PT;Supervision - Intermittent     Equipment Recommendations  None recommended by PT    Recommendations for Other Services       Precautions / Restrictions Precautions Precautions: Fall Restrictions Weight Bearing Restrictions: No    Mobility  Bed Mobility Overal bed mobility: Modified Independent Bed Mobility: Supine to Sit     Supine to sit: Modified independent (Device/Increase time);HOB elevated     General bed mobility comments: attempted to lower HOB however pt reports "I sleep on the couch with a lot of pillows"  Transfers Overall transfer level: Needs assistance Equipment used:  Rolling walker (2 wheeled) Transfers: Sit to/from Stand Sit to Stand: Supervision         General transfer comment: Needed cues for hand placement.   Ambulation/Gait Ambulation/Gait assistance: Supervision Ambulation Distance (Feet): 200 Feet Assistive device: Rolling walker (2 wheeled) Gait Pattern/deviations: Step-through pattern     General Gait Details: + SOB but SpO2 at 93% on 2LO2 via [place], no episodes of LOB, increased bilat UE support on RW   Stairs Stairs: Yes Stairs assistance: Min assist Stair Management: No rails;Forwards (HHA due to no handrails at home) Number of Stairs: 3 General stair comments: pt with safe technique and reports sometimes she holds onto wall  Wheelchair Mobility    Modified Rankin (Stroke Patients Only)       Balance Overall balance assessment: Needs assistance           Standing balance-Leahy Scale: Fair Standing balance comment: pt can stand without UE support but requires RW for long distance amb                    Cognition Arousal/Alertness: Awake/alert Behavior During Therapy: WFL for tasks assessed/performed Overall Cognitive Status: Within Functional Limits for tasks assessed                      Exercises      General Comments        Pertinent Vitals/Pain Pain Assessment: 0-10 Pain Score: 3  Pain Location: incision site Pain Descriptors / Indicators: Aching;Dull Pain Intervention(s): Monitored during session    Home Living  Prior Function            PT Goals (current goals can now be found in the care plan section) Acute Rehab PT Goals Patient Stated Goal: to go home Progress towards PT goals: Progressing toward goals    Frequency  Min 3X/week    PT Plan Current plan remains appropriate    Co-evaluation             End of Session Equipment Utilized During Treatment: Gait belt;Oxygen Activity Tolerance: Patient limited by fatigue Patient left:  in chair;with call bell/phone within reach     Time: 0913-0928 PT Time Calculation (min): 15 min  Charges:  $Gait Training: 8-22 mins                    G Codes:      Kingsley Callander 06/25/2014, 9:45 AM   Kittie Plater, PT, DPT Pager #: [phone] Office #: [phone]

## 2014-06-25 NOTE — Progress Notes (Signed)
Discharge instructions and prescription explained to and given to patient. Demonstrated understanding of instructions. IV removed per order. Patient escort called for discharge via wheelchair with oxygen at 2LPM going. Son here to accompany patient home. Brought Oxygen with him. Melford Aase, RN

## 2014-06-25 NOTE — Progress Notes (Signed)
Highlighted handouts for patient and went over material on COPD and Smoking Cessation. Patient demonstrated understanding of disease process and how it relates to smoking. Gave handouts.

## 2014-06-25 NOTE — Progress Notes (Signed)
[facility] Progress Note  3 Days Post-Op  Subjective: Pt doing well, having minimal pain.  No N/V, breathing improved.  WBC normalized today.  Ambulating well with PT.  Pulmonology said she can go home after smoking cessation nurse comes by.  Urinating well, +flatus, no BM yet.   Objective: Vital signs in last 24 hours: Temp:  [97.6 F (36.4 C)-98.2 F (36.8 C)] 98.2 F (36.8 C) (08/10 0636) Pulse Rate:  [76-82] 76 (08/10 0636) Resp:  [16-18] 18 (08/10 0636) BP: (107-123)/(47-53) 123/52 mmHg (08/10 0636) SpO2:  [94 %-98 %] 95 % (08/10 0636) Last BM Date: 06/21/14  Intake/Output from previous day: 08/09 0701 - 08/10 0700 In: 820 [P.O.:820] Out: 650 [Urine:650] Intake/Output this shift:    PE: Gen:  Alert, NAD, pleasant Abd: Soft, NT/ND, +BS, no HSM, incisions C/D/I  Lab Results:   Recent Labs  06/24/14 0520 06/25/14 0357  WBC 19.1* 9.9  HGB 11.5* 10.4*  HCT 34.7* 32.9*  PLT 312 392   BMET  Recent Labs  06/24/14 0520  NA 140  K 5.2  CL 108  CO2 21  GLUCOSE 118*  BUN 17  CREATININE 0.48*  CALCIUM 8.7   PT/INR No results found for this basename: LABPROT, INR,  in the last 72 hours CMP     Component Value Date/Time   NA 140 06/24/2014 0520   K 5.2 06/24/2014 0520   CL 108 06/24/2014 0520   CO2 21 06/24/2014 0520   GLUCOSE 118* 06/24/2014 0520   BUN 17 06/24/2014 0520   CREATININE 0.48* 06/24/2014 0520   CALCIUM 8.7 06/24/2014 0520   PROT 5.9* 06/24/2014 0520   ALBUMIN 2.6* 06/24/2014 0520   AST 47* 06/24/2014 0520   ALT 85* 06/24/2014 0520   ALKPHOS 101 06/24/2014 0520   BILITOT 0.4 06/24/2014 0520   GFRNONAA >90 06/24/2014 0520   GFRAA >90 06/24/2014 0520   Lipase     Component Value Date/Time   LIPASE 22 06/22/2014 0254       Studies/Results: Dg Chest Port 1 View  06/24/2014   CLINICAL DATA:  73 year old female with shortness of breath.  EXAM: PORTABLE CHEST - 1 VIEW  COMPARISON:  05/07/2014 and prior chest radiographs  FINDINGS: The cardiomediastinal  silhouette is unremarkable.  There is no evidence of focal airspace disease, pulmonary edema, suspicious pulmonary nodule/mass, pleural effusion, or pneumothorax. No acute bony abnormalities are identified.  IMPRESSION: No active disease.   Electronically Signed   By: Hassan Rowan M.D.   On: 06/24/2014 10:49    Anti-infectives: Anti-infectives   Start     Dose/Rate Route Frequency Ordered Stop   06/22/14 1400  piperacillin-tazobactam (ZOSYN) IVPB 3.375 g  Status:  Discontinued     3.375 g 12.5 mL/hr over 240 Minutes Intravenous 3 times per day 06/22/14 1018 06/22/14 1758   06/22/14 1345  piperacillin-tazobactam (ZOSYN) IVPB 3.375 g     3.375 g 100 mL/hr over 30 Minutes Intravenous STAT 06/22/14 1339 06/23/14 1345   06/22/14 0615  piperacillin-tazobactam (ZOSYN) IVPB 3.375 g     3.375 g 12.5 mL/hr over 240 Minutes Intravenous  Once 06/22/14 0605 06/22/14 0835       Assessment/Plan Acute calculus cholecystitis POD #3 s/p lap chole with IOC Leukocytosis - resolved at 9.9  Plan: 1.  IVF, pain control, antiemetics 2.  Ambulate and IS 3.  SCD's and lovenox 4.  On regular diet, appetite improving 5.  Add dulcolax and cont colace 6.  D/c today  LOS: 3 days    Coralie Keens 06/25/2014, 8:59 AM Pager: [phone]

## 2014-06-25 NOTE — Discharge Summary (Signed)
[facility] Discharge Summary   Patient ID: Valerie Gay MRN: [id number] DOB/AGE: 1941-01-19 73 y.o.  Admit date: 06/22/2014 Discharge date: 06/25/2014  Admitting Diagnosis: Acute calculus cholecystitis  Discharge Diagnosis Patient Active Problem List   Diagnosis Date Noted  . Acute calculous cholecystitis 06/22/2014  . Acute cholecystitis 06/22/2014  . Pre-operative respiratory examination 06/22/2014  . Abnormal chest x-ray 05/28/2014  . COPD, moderate 05/07/2014  . Respiratory failure, acute 03/27/2014  . COPD exacerbation 03/27/2014  . CAP (community acquired pneumonia) 03/27/2014  . Nicotine abuse 03/27/2014  . Hypokalemia 03/27/2014  . Leukocytosis 03/27/2014  . TOBACCO USE 11/05/2008  . COPD 11/05/2008  . HYPERGLYCEMIA 11/05/2008  . ANEMIA-NOS 07/21/2007    Consultants Pulmonology (Dr. Chase Caller, Dr. Melvyn Novas)  Imaging: Dg Chest Port 1 View  06/24/2014   CLINICAL DATA:  73 year old female with shortness of breath.  EXAM: PORTABLE CHEST - 1 VIEW  COMPARISON:  05/07/2014 and prior chest radiographs  FINDINGS: The cardiomediastinal silhouette is unremarkable.  There is no evidence of focal airspace disease, pulmonary edema, suspicious pulmonary nodule/mass, pleural effusion, or pneumothorax. No acute bony abnormalities are identified.  IMPRESSION: No active disease.   Electronically Signed   By: Hassan Rowan M.D.   On: 06/24/2014 10:49    Procedures Dr. Redmond Pulling (06/22/14) - Laparoscopic Cholecystectomy with [facility]   Hospital Course:  73 year old white female who has moderate to severe COPD and requires 2L of nasal cannula oxygen at night when sleeping. She denies shortness of breath when doing her normal ADLs but admits to difficulty when she is in rehabilitation walking laps. She denies chest pain. She sleeps on multiple pillows at night, but this is for comfort not to assist with breathing.   Yesterday around 3:00 in the afternoon, she developed epigastric and  right upper quadrant abdominal pain. She denied any nausea or vomiting. Just pain. No change in her bowel habits. Due to persistent pain she presented to [facility] emergency department this morning where she was evaluated and worked up for her abdominal pain. Her ultrasound revealed gallbladder wall thickening and some pericholecystic fluid consistent with acute cholecystitis with cholelithiasis. Her white blood cell count is mildly elevated around 13,000. Her liver function tests are otherwise normal.   Workup showed acute calculus cholecystitis.  Patient was admitted and underwent procedure listed above.  Tolerated procedure well and was transferred to the floor.  Diet was advanced as tolerated.  She continued to have a high WBC which was likely a reaction to surgery.  This WBC soon trended down and was normal on the day of discharge.  Her appetite remained low, but is improving.  She was noted to feel more SOB since the surgery, but is improving on her IS (1200) and will be closely followed by Dr. Chase Caller as an outpatient given her COPD and home O2 requirements.  On POD #3, the patient was voiding well, tolerating diet, ambulating well, pain well controlled, vital signs stable, incisions c/d/i and felt stable for discharge home.  Patient will follow up in our office in 4 weeks and knows to call with questions or concerns.     Medication List         albuterol 108 (90 BASE) MCG/ACT inhaler  Commonly known as:  PROVENTIL HFA;VENTOLIN HFA  Inhale 2 puffs into the lungs every 6 (six) hours as needed for wheezing or shortness of breath.     albuterol (2.5 MG/3ML) 0.083% nebulizer solution  Commonly known as:  PROVENTIL  Take 3  mLs (2.5 mg total) by nebulization every 8 (eight) hours as needed for wheezing or shortness of breath.     aspirin 81 MG tablet  Take 81-162 mg by mouth daily.     bisacodyl 10 MG suppository  Commonly known as:  DULCOLAX  Place 1 suppository (10 mg total) rectally  daily as needed for moderate constipation.     budesonide-formoterol 160-4.5 MCG/ACT inhaler  Commonly known as:  SYMBICORT  Inhale 2 puffs into the lungs 2 (two) times daily.     diphenhydrAMINE 25 mg capsule  Commonly known as:  BENADRYL  Take 25 mg by mouth every 6 (six) hours as needed for itching.     DSS 100 MG Caps  Take 100 mg by mouth 2 (two) times daily as needed for mild constipation.     oxyCODONE 5 MG immediate release tablet  Commonly known as:  Oxy IR/ROXICODONE  Take 1-2 tablets (5-10 mg total) by mouth every 6 (six) hours as needed for moderate pain.     triamcinolone 55 MCG/ACT Aero nasal inhaler  Commonly known as:  NASACORT  Place 2 sprays into the nose daily.         Follow-up Information   Follow up with [facility], MD On 07/09/2014. (10 am )    Specialty:  Pulmonary Disease   Contact information:   [place] [address] [phone]       Follow up with [facility] On 07/24/2014. (For post-operation check. Your appointment is at 1:45pm, please arrive at least 30 min before your appointment to complete your check in paperwork.  If you are unable to arrive 30 min prior to your appointment time we may have to cancel or reschedule you)    Contact information:   [place] [address] [phone]       Signed: [facility] [phone]  06/25/2014, 10:13 AM

## 2014-06-26 ENCOUNTER — Encounter (HOSPITAL_COMMUNITY): Payer: Medicare Other

## 2014-06-28 ENCOUNTER — Encounter (HOSPITAL_COMMUNITY): Admission: RE | Admit: 2014-06-28 | Payer: Medicare Other | Source: Ambulatory Visit

## 2014-07-03 ENCOUNTER — Encounter (HOSPITAL_COMMUNITY): Payer: Medicare Other

## 2014-07-05 ENCOUNTER — Encounter (HOSPITAL_COMMUNITY): Payer: Medicare Other

## 2014-07-09 ENCOUNTER — Encounter: Payer: Self-pay | Admitting: Internal Medicine

## 2014-07-09 ENCOUNTER — Ambulatory Visit (INDEPENDENT_AMBULATORY_CARE_PROVIDER_SITE_OTHER): Payer: Medicare Other | Admitting: Internal Medicine

## 2014-07-09 VITALS — BP 120/78 | HR 76 | Ht 64.0 in | Wt 153.6 lb

## 2014-07-09 DIAGNOSIS — J449 Chronic obstructive pulmonary disease, unspecified: Secondary | ICD-10-CM | POA: Diagnosis not present

## 2014-07-09 DIAGNOSIS — R9389 Abnormal findings on diagnostic imaging of other specified body structures: Secondary | ICD-10-CM | POA: Diagnosis not present

## 2014-07-09 DIAGNOSIS — F17200 Nicotine dependence, unspecified, uncomplicated: Secondary | ICD-10-CM | POA: Diagnosis not present

## 2014-07-09 NOTE — Progress Notes (Signed)
Subjective:    Patient ID: Valerie Gay, female    DOB: 08-09-1941, 73 y.o.   MRN: [id number]  HPI    IOV  04/04/2014  Chief Complaint  Patient presents with  . Follow-up    HFU-d/c from [facility]. Pt states she went home with O2  but only wears it at night. C/o mild SOB with daily activities. Denies CP and cough.     IOV 04/04/2014  Valerie Gay has no PCP or pulmonologist. Has  reports that she quit smoking 8 days ago. Her smoking use included Cigarettes. She has a 30 pack-year smoking history. She does not have any smokeless tobacco history on file.  She presents for first time pulmonary office visit with her family her son and daughter-in-law. She reports a diagnosis of COPD not otherwise specified in 2009 when she was admitted for presumed COPD exacerbation. After that has been going at baseline health with mild chronic dyspnea on exertion with mild cough. At baseline she would mow the yard, climb a flight of stairs and do her ADLs with just some limitations. Then on 03/26/2014 she was mowing the yard and was exposed to pollen. The following day she was admitted for right lower lobe pneumonia and treated by the hospitalist team as community-acquired pneumonia associated with COPD exacerbation and acute respiratory failure. She was discharged on 03/30/2014 with 24 hours oxygen therapy and discharge prednisone and Levaquin. Was advised to quit smoking.  Today she reports on 04/04/2014 that his walking is in remission and she has returned to baseline health with just mild baseline exertional dyspnea and mild chronic cough. She is on Spiriva. She she's still on  Levaquin and prednisone taper the ending today and in the next few days respectively. Her main concern is about continued oxygen use.  Walkking desat test 185 feet x 3 laps: desaturated at 2 laps to 87% with peak hr 117   #COPD  - please have full PFT in 4 weeks  - continue spiriva daily; we will ensure refill  - continue  oxygen at night and during day for any exertion above 100 yards - referred to rehab for emphysema  - at fu do CAT Score, and check alpha 1  #smoking - congrats, stay in remission  #Right lower lobe pneumonia  - do CXR 2 view in 4 weeks - depending on results needs med calendar  OV 05/07/2014  Chief Complaint  Patient presents with  . Follow-up    Pt with pft results from today. Pt states breathing has improved. C/o dyspnea when bending over and when carrying things. Denies cough and CP.    COPD: Currently stable.  Unclear if has high eos making her prone for AECOPD. She is on Spiriva and Symbicort. COPD CAT  is 11 and reflect only minimal symptoms. Pulmonary function test shows gold stage II COPD with a postbronchodilator FEV1 is 1.1 L/52%. FVC of 2.4L/84%. Ratio 47. Bronchodilator response is 12%.  SHe is compliant with spiriva but not so much withsymbicort and o2. 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 She is s/p cataract surgery and has family hx of glaucoma. She stil has dyspnea with bending. She will start rehab soon . She is interested in research trials as long as no major change to her MDIs.   Smoking: has relapsed. Advised to quit  RLL pneumonia: May 2015. Currently well. Will have cxr today   PASt: Sh wants me to touch base with her  eye doc about safety of her mdi  05/25/14 Follow up COPD  Returns for follow up for CT  Chest. Breathing has improved.  Does have SOB after waking up in the mornings with slight chest tightness. Resolves after getting up and going .   No wheezing, CP, or cough, hemoptysis, edema or orthopnea.  Continues to smoke , discussed smoking cessaiton.   CT chest on 05/15/14 showed moderate to severe COPD  Linear densities in RML [place] . No cavitation. Appears to be scarring  But will repeat CT chest in 6 months.  Results reviewed with pt.     OV 07/09/2014  Chief Complaint  Patient presents with  . Follow-up    HFU-  Pt was d/c from [facility] for cholecystectomy. Pt states her breathing is unchanged. Pt c/o DOE and chest tightness when "it's time to take inhalers" .Pt deneis cough.    COPD: Stable. Off spiriva due to itching in July 2015. Taking symbicort only. Controls her symptoms well. FLuy shot not yet avilable but she knows she needs to get it as soon as available. Not compliant with o2. Completed 3 weeks of acute rehab but took a break due to recent cholecystectomy. Wants to go back to rehab. Discused [employer] IMPACT research study: blinded >/= 52 week randomized trial of ANORO equivalent v BREO equivalent v GSK triple MDI (ICS + LABA + LAMA). She will relevauate this in 6 weeks; currently wants to hold off due to recent surgery and need to complete rehab   SMoking:  reports that she has been smoking Cigarettes.  She has a 30 pack-year smoking history. She does not have any smokeless tobacco history on file.   Pulmon infiltrates - seen in RML and lingular CT June 2015.  NExt CT dec 2015   Past hx: Early aug - had s/p acute cholecystectomy. Now well   Review of Systems  Constitutional: Negative for fever and unexpected weight change.  HENT: Negative for congestion, dental problem, ear pain, nosebleeds, postnasal drip, rhinorrhea, sinus pressure, sneezing, sore throat and trouble swallowing.   Eyes: Negative for redness and itching.  Respiratory: Positive for chest tightness and shortness of breath. Negative for cough and wheezing.   Cardiovascular: Negative for palpitations and leg swelling.  Gastrointestinal: Negative for nausea and vomiting.  Genitourinary: Negative for dysuria.  Musculoskeletal: Negative for joint swelling.  Skin: Negative for rash.  Neurological: Negative for headaches.  Hematological: Does not bruise/bleed easily.  Psychiatric/Behavioral: Negative for dysphoric mood. The patient is not nervous/anxious.        Objective:   Physical Exam  Filed Vitals:   07/09/14 1008  BP: 120/78    Pulse: 76  Height: 5\' 4"  (1.626 m)  Weight: 153 lb 9.6 oz (69.673 kg)  SpO2: 97%   Physical Exam GEN: A/Ox3; pleasant , NAD, elderly   HEENT:  [place]/AT,  EACs-clear, TMs-wnl, NOSE-clear, THROAT-clear, no lesions, no postnasal drip or exudate noted.   NECK:  Supple w/ fair ROM; no JVD; normal carotid impulses w/o bruits; no thyromegaly or nodules palpated; no lymphadenopathy.  RESP  Decreased BS in bases w/ no wheezing   CARD:  RRR, no m/r/g  , no peripheral edema, pulses intact, no cyanosis or clubbing.  GI:   Soft & nt; nml bowel sounds; no organomegaly or masses detected.  Musco: Warm bil, no deformities or joint swelling noted.   Neuro: alert, no focal deficits noted.    Skin: Warm, no lesions or rashes  Assessment & Plan:  #COPD - current stable  - you have moderate copd - half of normal lung function - continue  symbicort daily - will ensure spiriva in allergy list due to itching though I Doubt that was cause  - continue oxygen at night and during day for any exertion above 100 yards - glad you started pulmonary rehab for emphysema; you can go back to it now that you are healed from gall bladder surgery  - flu shot in fall   #smoking - to bad you smoking again; please quit or lungs will get worse  #Right middle lobe density on CT June 2015  - repeat CT chest Dec 2015; nurse will ensure there is prior order   #Followup  3 months or sooner if needed

## 2014-07-09 NOTE — Patient Instructions (Signed)
#  COPD - current stable  - you have moderate copd - half of normal lung function - continue  symbicort daily - will ensure spiriva in allergy list due to itching though I Doubt that was cause  - continue oxygen at night and during day for any exertion above 100 yards - glad you started pulmonary rehab for emphysema; you can go back to it now that you are healed from gall bladder surgery  - flu shot in fall   #smoking - to bad you smoking again; please quit or lungs will get worse  #Right middle lobe density on CT June 2015  - repeat CT chest Dec 2015; nurse will ensure there is prior order   #Followup  3 months or sooner if needed

## 2014-07-10 ENCOUNTER — Encounter (HOSPITAL_COMMUNITY): Payer: Medicare Other

## 2014-07-12 ENCOUNTER — Encounter (HOSPITAL_COMMUNITY)
Admission: RE | Admit: 2014-07-12 | Discharge: 2014-07-12 | Disposition: A | Discharge status: Home / Self Care | Payer: Medicare Other | Source: Ambulatory Visit | Attending: Internal Medicine | Admitting: Internal Medicine

## 2014-07-12 DIAGNOSIS — J441 Chronic obstructive pulmonary disease with (acute) exacerbation: Secondary | ICD-10-CM | POA: Diagnosis not present

## 2014-07-12 NOTE — Progress Notes (Signed)
Today, Jule exercised at [facility]. Service time was from 1100 to 1315.  The patient exercised for more than 31 minutes performing aerobic, strengthening, and stretching exercises. Oxygen saturation, heart rate, blood pressure, rate of perceived exertion, and shortness of breath were all monitored before, during, and after exercise. Mileidy presented with no problems at today's exercise session. Patient attended the M.D. Day lecture.  Adeleine has returned from gall bladder surgery and was weak today.  She rested most of the second station.  She is regaining her strength post surgery.    There was no workload change during today's exercise session.  Pre-exercise vitals:   Weight kg: 69.3   Liters of O2: RA   SpO2: 98   HR: 81   BP: 112/60   CBG: NA  Exercise vitals:   Highest heartrate:  78   Lowest oxygen saturation: 97   Highest blood pressure: 130/70   Liters of 02: RA  Post-exercise vitals:   SpO2: 97   HR: 91   BP: 114/60   Liters of O2: RA   CBG: NA Dr. Brand Males, Medical Director Dr. Broadus John is immediately available during today's Pulmonary Rehab session for [name] on 07/12/2014 at 1030 class time.

## 2014-07-15 NOTE — Assessment & Plan Note (Signed)
#  COPD - current stable  - you have moderate copd - half of normal lung function - continue  symbicort daily - will ensure spiriva in allergy list due to itching though I Doubt that was cause  - continue oxygen at night and during day for any exertion above 100 yards - glad you started pulmonary rehab for emphysema; you can go back to it now that you are healed from gall bladder surgery  - flu shot in fall

## 2014-07-15 NOTE — Assessment & Plan Note (Signed)
#  smoking - to bad you smoking again; please quit or lungs will get wors

## 2014-07-15 NOTE — Assessment & Plan Note (Signed)
Right middle lobe density on CT June 2015  - repeat CT chest Dec 2015; nurse will ensure there is prior order

## 2014-07-17 ENCOUNTER — Encounter (HOSPITAL_COMMUNITY)
Admission: RE | Admit: 2014-07-17 | Discharge: 2014-07-17 | Disposition: A | Discharge status: Home / Self Care | Payer: Medicare Other | Source: Ambulatory Visit | Attending: Internal Medicine | Admitting: Internal Medicine

## 2014-07-17 DIAGNOSIS — J9600 Acute respiratory failure, unspecified whether with hypoxia or hypercapnia: Secondary | ICD-10-CM | POA: Insufficient documentation

## 2014-07-17 DIAGNOSIS — J441 Chronic obstructive pulmonary disease with (acute) exacerbation: Secondary | ICD-10-CM | POA: Diagnosis not present

## 2014-07-17 DIAGNOSIS — F17200 Nicotine dependence, unspecified, uncomplicated: Secondary | ICD-10-CM | POA: Diagnosis not present

## 2014-07-17 NOTE — Progress Notes (Signed)
Today, Valerie Gay exercised at [facility]. Service time was from 1030 to 1215.  The patient exercised for more than 31 minutes performing aerobic, strengthening, and stretching exercises. Oxygen saturation, heart rate, blood pressure, rate of perceived exertion, and shortness of breath were all monitored before, during, and after exercise. Valerie Gay presented with no problems at today's exercise session.   There was no workload change during today's exercise session.  Pre-exercise vitals:   Weight kg: 68.9   Liters of O2: RA   SpO2: 96   HR: 79   BP: 120/60   CBG: NA  Exercise vitals:   Highest heartrate:  123   Lowest oxygen saturation: 93   Highest blood pressure: 120/70   Liters of 02: RA  Post-exercise vitals:   SpO2: 96   HR: 69   BP: 112/56   Liters of O2: RA   CBG: NA Dr. Brand Males, Medical Director Dr. Broadus John is immediately available during today's Pulmonary Rehab session for Valerie Gay on 07/17/2014 at 1030 class time.

## 2014-07-19 ENCOUNTER — Encounter (HOSPITAL_COMMUNITY)
Admission: RE | Admit: 2014-07-19 | Discharge: 2014-07-19 | Disposition: A | Discharge status: Home / Self Care | Payer: Medicare Other | Source: Ambulatory Visit | Attending: Internal Medicine | Admitting: Internal Medicine

## 2014-07-19 DIAGNOSIS — J441 Chronic obstructive pulmonary disease with (acute) exacerbation: Secondary | ICD-10-CM | POA: Diagnosis not present

## 2014-07-19 NOTE — Progress Notes (Signed)
Today, Valerie Gay exercised at [facility]. Service time was from 1030 to 1230.  The patient exercised for more than 31 minutes performing aerobic, strengthening, and stretching exercises. Oxygen saturation, heart rate, blood pressure, rate of perceived exertion, and shortness of breath were all monitored before, during, and after exercise. Valerie Gay presented with no problems at today's exercise session.  Patient attended the oxygen safety class today.   There was no workload change during today's exercise session.  Pre-exercise vitals:   Weight kg: 68.8   Liters of O2: RA   SpO2: 96   HR: 80   BP: 124/78   CBG: NA  Exercise vitals:   Highest heartrate:  104   Lowest oxygen saturation: 94   Highest blood pressure: 122/60   Liters of 02: RA  Post-exercise vitals:   SpO2: 95   HR: 88   BP: 102/60   Liters of O2: RA   CBG: NA Dr. Brand Males, Medical Director Dr. Wendee Beavers is immediately available during today's Pulmonary Rehab session for Valerie Gay on 07/19/2014 at 1030 class time.

## 2014-07-24 ENCOUNTER — Encounter (HOSPITAL_COMMUNITY)
Admission: RE | Admit: 2014-07-24 | Discharge: 2014-07-24 | Disposition: A | Discharge status: Home / Self Care | Payer: Medicare Other | Source: Ambulatory Visit | Attending: Internal Medicine | Admitting: Internal Medicine

## 2014-07-24 ENCOUNTER — Encounter (INDEPENDENT_AMBULATORY_CARE_PROVIDER_SITE_OTHER): Payer: Medicare Other

## 2014-07-24 DIAGNOSIS — J441 Chronic obstructive pulmonary disease with (acute) exacerbation: Secondary | ICD-10-CM | POA: Diagnosis not present

## 2014-07-24 NOTE — Progress Notes (Signed)
Today, Valerie Gay exercised at [facility]. Service time was from 1030 to 1215.  The patient exercised for more than 31 minutes performing aerobic, strengthening, and stretching exercises. Oxygen saturation, heart rate, blood pressure, rate of perceived exertion, and shortness of breath were all monitored before, during, and after exercise. Valerie Gay presented with no problems at today's exercise session.   There was a workload change during today's exercise session.  Pre-exercise vitals:   Weight kg: 69.0   Liters of O2: ra   SpO2: 94   HR: 70   BP: 112/72   CBG: na  Exercise vitals:   Highest heartrate:  115   Lowest oxygen saturation: 93   Highest blood pressure: 126/70   Liters of 02: ra  Post-exercise vitals:   SpO2: 97   HR: 73   BP: 112/76   Liters of O2: ra   CBG: na  Dr. Brand Males, Medical Director Dr. Wendee Beavers is immediately available during today's Pulmonary Rehab session for Valerie Gay on 07/24/2014 at 1030 class time.

## 2014-07-26 ENCOUNTER — Encounter (HOSPITAL_COMMUNITY)
Admission: RE | Admit: 2014-07-26 | Discharge: 2014-07-26 | Disposition: A | Discharge status: Home / Self Care | Payer: Medicare Other | Source: Ambulatory Visit | Attending: Internal Medicine | Admitting: Internal Medicine

## 2014-07-26 DIAGNOSIS — J441 Chronic obstructive pulmonary disease with (acute) exacerbation: Secondary | ICD-10-CM | POA: Diagnosis not present

## 2014-07-26 NOTE — Progress Notes (Signed)
Today, Yeng exercised at [facility]. Service time was from 10:30am to 12:30.  The patient exercised for more than 31 minutes performing aerobic, strengthening, and stretching exercises. Oxygen saturation, heart rate, blood pressure, rate of perceived exertion, and shortness of breath were all monitored before, during, and after exercise. Addi presented with no problems at today's exercise session.  The patient attended education class on Nutrition with Derek Mound.  There was no workload change during today's exercise session.  Pre-exercise vitals:   Weight kg: 69.0   Liters of O2: ra   SpO2: 97   HR: 87   BP: 126/64   CBG: na  Exercise vitals:   Highest heartrate:  122   Lowest oxygen saturation: 96   Highest blood pressure: 132/76   Liters of 02: ra  Post-exercise vitals:   SpO2: 96   HR: 83   BP: 104/66   Liters of O2: ra   CBG: na  Dr. Brand Males, Medical Director Dr. Coralyn Pear is immediately available during today's Pulmonary Rehab session for [name] on 07/26/14 at 10:30am class time.

## 2014-07-31 ENCOUNTER — Encounter (HOSPITAL_COMMUNITY)
Admission: RE | Admit: 2014-07-31 | Discharge: 2014-07-31 | Disposition: A | Discharge status: Home / Self Care | Payer: Medicare Other | Source: Ambulatory Visit | Attending: Internal Medicine | Admitting: Internal Medicine

## 2014-07-31 DIAGNOSIS — J441 Chronic obstructive pulmonary disease with (acute) exacerbation: Secondary | ICD-10-CM | POA: Diagnosis not present

## 2014-07-31 NOTE — Progress Notes (Signed)
Today, Ninamarie exercised at [facility]. Service time was from 10:30am to 12:15pm.  The patient exercised for more than 31 minutes performing aerobic, strengthening, and stretching exercises. Oxygen saturation, heart rate, blood pressure, rate of perceived exertion, and shortness of breath were all monitored before, during, and after exercise. Kharma presented with no problems at today's exercise session.   There was an increase in workload change during today's exercise session.  Pre-exercise vitals:   Weight kg: 69.5   Liters of O2: ra   SpO2: 96   HR: 76   BP: 106/76   CBG: na  Exercise vitals:   Highest heartrate:  111   Lowest oxygen saturation: 95%   Highest blood pressure: 104/70   Liters of 02: ra  Post-exercise vitals:   SpO2: 96   HR: 89   BP: 124/68   Liters of O2: ra   CBG: na  Dr. Brand Males, Medical Director Dr. Coralyn Pear is immediately available during today's Pulmonary Rehab session for [name] on 07/31/14 at 10:30am class time.

## 2014-08-02 ENCOUNTER — Encounter (HOSPITAL_COMMUNITY)
Admission: RE | Admit: 2014-08-02 | Discharge: 2014-08-02 | Disposition: A | Discharge status: Home / Self Care | Payer: Medicare Other | Source: Ambulatory Visit | Attending: Internal Medicine | Admitting: Internal Medicine

## 2014-08-02 DIAGNOSIS — J441 Chronic obstructive pulmonary disease with (acute) exacerbation: Secondary | ICD-10-CM | POA: Diagnosis not present

## 2014-08-02 NOTE — Progress Notes (Signed)
Today, Valerie Gay exercised at [facility]. Service time was from 1030 to 1230.  The patient exercised for more than 31 minutes performing aerobic, strengthening, and stretching exercises. Oxygen saturation, heart rate, blood pressure, rate of perceived exertion, and shortness of breath were all monitored before, during, and after exercise. Valerie Gay presented with no problems at today's exercise session. Valerie Gay also attended an education session on exercise for the pulmonary patient.  There was no workload change during today's exercise session.  Pre-exercise vitals:   Weight kg: 70.4   Liters of O2: ra   SpO2: 96   HR: 77   BP: 104/56   CBG: na  Exercise vitals:   Highest heartrate:  111   Lowest oxygen saturation: 94   Highest blood pressure: 108/60   Liters of 02: ra  Post-exercise vitals:   SpO2: 97   HR: 75   BP: 100/70   Liters of O2: ra   CBG: na  Dr. Brand Males, Medical Director Dr. Wyline Copas is immediately available during today's Pulmonary Rehab session for Valerie Gay on 08/02/2014 at 1030 class time.

## 2014-08-07 ENCOUNTER — Encounter (HOSPITAL_COMMUNITY)
Admission: RE | Admit: 2014-08-07 | Discharge: 2014-08-07 | Disposition: A | Discharge status: Home / Self Care | Payer: Medicare Other | Source: Ambulatory Visit | Attending: Internal Medicine | Admitting: Internal Medicine

## 2014-08-07 DIAGNOSIS — J441 Chronic obstructive pulmonary disease with (acute) exacerbation: Secondary | ICD-10-CM | POA: Diagnosis not present

## 2014-08-07 NOTE — Progress Notes (Signed)
Today, Valerie Gay exercised at [facility]. Service time was from 10:30am to 12:15pm.  The patient exercised for more than 31 minutes performing aerobic, strengthening, and stretching exercises. Oxygen saturation, heart rate, blood pressure, rate of perceived exertion, and shortness of breath were all monitored before, during, and after exercise. Valerie Gay presented with no problems at today's exercise session.   There was no workload change during today's exercise session.  Pre-exercise vitals:   Weight kg: 70.2   Liters of O2: ra   SpO2: 96   HR: 83   BP: 100/60   CBG: na  Exercise vitals:   Highest heartrate:  127   Lowest oxygen saturation: 94   Highest blood pressure: 122/64   Liters of 02: ra  Post-exercise vitals:   SpO2: 96   HR: 83   BP: 114/64   Liters of O2: ra   CBG: na  Dr. Brand Males, Medical Director Dr. Wyline Copas is immediately available during today's Pulmonary Rehab session for Valerie Gay on 08/07/14 at 10:30am class time.

## 2014-08-07 NOTE — Progress Notes (Signed)
Valerie Gay 73 y.o. female Nutrition Note Spoke with pt. Pt is overweight. There are many ways the pt can make her eating habits healthier.  Pt's Rate Your Plate results reviewed with pt. Pt does not avoid salty food; uses canned/ convenience food and eats out frequently. Pt states she eats out "because I've always hated to cook." Pt adds salt to food. Pt is looking at sodium content of foods more since she attended the Pulmonary Nutrition class. Pt expressed understanding of the information reviewed.  Nutrition Diagnosis   Excessive sodium intake related to over consumption of processed food as evidenced by frequent consumption of convenience food/ canned vegetables and eating out frequently.   Food-and nutrition-related knowledge deficit related to lack of exposure to information as related to diagnosis of pulmonary disease   Overweight related to excessive energy intake as evidenced by a BMI of 26.2    Nutrition Intervention   Pt's individual nutrition plan and goals reviewed with pt.   Benefits of adopting healthy eating habits discussed when pt's Rate Your Plate reviewed.   Pt to attend the Nutrition and Lung Disease class - met; 07/26/14   Continual client-centered nutrition education by RD, as part of interdisciplinary care. Goal(s) 1. Pt to identify and limit food sources of sodium. 2. Identify food quantities necessary to achieve wt loss at graduation from pulmonary rehab. 3. Describe the benefit of including fruits, vegetables, whole grains, and low-fat dairy products in a healthy meal plan. Monitor and Evaluate progress toward nutrition goal with team.   Derek Mound, M.Ed, RD, LDN, CDE 08/07/2014 1:35 PM

## 2014-08-09 ENCOUNTER — Encounter (HOSPITAL_COMMUNITY)
Admission: RE | Admit: 2014-08-09 | Discharge: 2014-08-09 | Disposition: A | Discharge status: Home / Self Care | Payer: Medicare Other | Source: Ambulatory Visit | Attending: Internal Medicine | Admitting: Internal Medicine

## 2014-08-09 DIAGNOSIS — J441 Chronic obstructive pulmonary disease with (acute) exacerbation: Secondary | ICD-10-CM | POA: Diagnosis not present

## 2014-08-09 NOTE — Progress Notes (Signed)
Today, Valerie Gay exercised at [facility]. Service time was from 10:30am to 12:20pm.  The patient exercised for more than 31 minutes performing aerobic, strengthening, and stretching exercises. Oxygen saturation, heart rate, blood pressure, rate of perceived exertion, and shortness of breath were all monitored before, during, and after exercise. Valerie Gay presented with no problems at today's exercise session. The patient attended education today with Rosebud Poles on "Warning Signs and Symptoms".  There was no workload change during today's exercise session.  Pre-exercise vitals:   Weight kg: 70.5   Liters of O2: ra   SpO2: 97   HR: 81   BP: 100/60   CBG: na  Exercise vitals:   Highest heartrate:  108   Lowest oxygen saturation: 95   Highest blood pressure: 110/70   Liters of 02: ra  Post-exercise vitals:   SpO2: 98   HR: 80   BP: 98/60   Liters of O2: ra   CBG: na  Dr. Brand Males, Medical Director Dr. Dyann Kief is immediately available during today's Pulmonary Rehab session for Valerie Gay on 08/09/14 at 10:30am class time.

## 2014-08-14 ENCOUNTER — Encounter (HOSPITAL_COMMUNITY)
Admission: RE | Admit: 2014-08-14 | Discharge: 2014-08-14 | Disposition: A | Discharge status: Home / Self Care | Payer: Medicare Other | Source: Ambulatory Visit | Attending: Internal Medicine | Admitting: Internal Medicine

## 2014-08-14 DIAGNOSIS — J441 Chronic obstructive pulmonary disease with (acute) exacerbation: Secondary | ICD-10-CM | POA: Diagnosis not present

## 2014-08-14 NOTE — Progress Notes (Signed)
Today, Valerie Gay exercised at [facility]. Service time was from 10:30am to 12:15pm.  The patient exercised for more than 31 minutes performing aerobic, strengthening, and stretching exercises. Oxygen saturation, heart rate, blood pressure, rate of perceived exertion, and shortness of breath were all monitored before, during, and after exercise. Valerie Gay presented with no problems at today's exercise session.   There was no workload change during today's exercise session.  Pre-exercise vitals:   Weight kg: 70.3   Liters of O2: ra   SpO2: 97   HR: 91   BP: 114/66   CBG: na  Exercise vitals:   Highest heartrate:  122   Lowest oxygen saturation: 94   Highest blood pressure: 106/60   Liters of 02: ra  Post-exercise vitals:   SpO2: 96   HR: 78   BP: 112/72   Liters of O2: ra   CBG: na  Dr. Brand Males, Medical Director Dr. Dyann Kief is immediately available during today's Pulmonary Rehab session for Valerie Gay on 08/14/14 at 10:30am class time.

## 2014-08-15 DIAGNOSIS — Z23 Encounter for immunization: Secondary | ICD-10-CM | POA: Diagnosis not present

## 2014-08-16 ENCOUNTER — Encounter (HOSPITAL_COMMUNITY): Payer: Medicare Other

## 2014-08-16 ENCOUNTER — Encounter (HOSPITAL_COMMUNITY)
Admission: RE | Admit: 2014-08-16 | Discharge: 2014-08-16 | Disposition: A | Discharge status: Home / Self Care | Payer: Medicare Other | Source: Ambulatory Visit | Attending: Internal Medicine | Admitting: Internal Medicine

## 2014-08-16 DIAGNOSIS — J439 Emphysema, unspecified: Secondary | ICD-10-CM | POA: Diagnosis not present

## 2014-08-16 DIAGNOSIS — Z5189 Encounter for other specified aftercare: Secondary | ICD-10-CM | POA: Insufficient documentation

## 2014-08-16 NOTE — Progress Notes (Signed)
Today, Valerie Gay exercised at [facility]. Service time was from 11:15 to 1:30pm.  The patient exercised for more than 31 minutes performing aerobic, strengthening, and stretching exercises. Oxygen saturation, heart rate, blood pressure, rate of perceived exertion, and shortness of breath were all monitored before, during, and after exercise. Valerie Gay presented with no problems at today's exercise session. The patient attended MD Lecture Day with Dr. Nelda Marseille.  There was an increase in workload change during today's exercise session.  Pre-exercise vitals:   Weight kg: 69.8   Liters of O2: ra   SpO2: 97   HR: 81   BP: 98/66   CBG: na  Exercise vitals:   Highest heartrate:  116   Lowest oxygen saturation: 94   Highest blood pressure: 128/68   Liters of 02: ra  Post-exercise vitals:   SpO2: 95   HR: 79   BP: 102/64   Liters of O2: ra   CBG: na  Dr. Brand Males, Medical Director Dr. Aileen Fass is immediately available during today's Pulmonary Rehab session for Valerie Gay on 08/16/14 at 10:30am class time.

## 2014-08-21 ENCOUNTER — Encounter (HOSPITAL_COMMUNITY)
Admission: RE | Admit: 2014-08-21 | Discharge: 2014-08-21 | Disposition: A | Discharge status: Home / Self Care | Payer: Medicare Other | Source: Ambulatory Visit | Attending: Internal Medicine | Admitting: Internal Medicine

## 2014-08-21 DIAGNOSIS — Z5189 Encounter for other specified aftercare: Secondary | ICD-10-CM | POA: Diagnosis not present

## 2014-08-21 NOTE — Progress Notes (Signed)
Today, [name] exercised at [facility]. Service time was from 10:30am to 12:15pm.  The patient exercised for more than 31 minutes performing aerobic, strengthening, and stretching exercises. Oxygen saturation, heart rate, blood pressure, rate of perceived exertion, and shortness of breath were all monitored before, during, and after exercise. [name] presented with no problems at today's exercise session.   There was an increase in workload change during today's exercise session.  Pre-exercise vitals:   Weight kg: 69.9   Liters of O2: ra   SpO2: 97   HR: 79   BP: 128/58   CBG: na  Exercise vitals:   Highest heartrate:  115   Lowest oxygen saturation: 95   Highest blood pressure: 110/64   Liters of 02: a  Post-exercise vitals:   SpO2: 95   HR: 79   BP: 100/64   Liters of O2: ra   CBG: na  Dr. Brand Males, Medical Director Dr. Aileen Fass is immediately available during today's Pulmonary Rehab session for [name] on 08/21/14 at 10:30am class time.

## 2014-08-23 ENCOUNTER — Encounter (HOSPITAL_COMMUNITY)
Admission: RE | Admit: 2014-08-23 | Discharge: 2014-08-23 | Disposition: A | Discharge status: Home / Self Care | Payer: Medicare Other | Source: Ambulatory Visit | Attending: Internal Medicine | Admitting: Internal Medicine

## 2014-08-23 DIAGNOSIS — Z5189 Encounter for other specified aftercare: Secondary | ICD-10-CM | POA: Diagnosis not present

## 2014-08-23 NOTE — Progress Notes (Signed)
Today, Toniann exercised at [facility]. Service time was from 1030 to 1230.  The patient exercised for more than 31 minutes performing aerobic, strengthening, and stretching exercises. Oxygen saturation, heart rate, blood pressure, rate of perceived exertion, and shortness of breath were all monitored before, during, and after exercise. Nohelani presented with no problems at today's exercise session. Nishika also attended an education session on pulmonary medications.  There was no workload change during today's exercise session.  Pre-exercise vitals:   Weight kg: 69.3   Liters of O2: ra   SpO2: 96   HR: 87   BP: 108/60   CBG: na  Exercise vitals:   Highest heartrate:  109   Lowest oxygen saturation: 91   Highest blood pressure: 122/58   Liters of 02: ra  Post-exercise vitals:   SpO2: 96   HR: 84   BP: 100/56   Liters of O2: ra   CBG: na  Dr. Brand Males, Medical Director Dr. Dyann Kief is immediately available during today's Pulmonary Rehab session for [name] on 08/23/2014 at 1030 class time.

## 2014-08-28 ENCOUNTER — Encounter (HOSPITAL_COMMUNITY): Payer: Medicare Other

## 2014-08-28 DIAGNOSIS — H3342 Traction detachment of retina, left eye: Secondary | ICD-10-CM | POA: Diagnosis not present

## 2014-08-28 DIAGNOSIS — H1789 Other corneal scars and opacities: Secondary | ICD-10-CM | POA: Diagnosis not present

## 2014-08-28 DIAGNOSIS — H3531 Nonexudative age-related macular degeneration: Secondary | ICD-10-CM | POA: Diagnosis not present

## 2014-08-28 DIAGNOSIS — H3532 Exudative age-related macular degeneration: Secondary | ICD-10-CM | POA: Diagnosis not present

## 2014-08-30 ENCOUNTER — Encounter (HOSPITAL_COMMUNITY)
Admission: RE | Admit: 2014-08-30 | Discharge: 2014-08-30 | Disposition: A | Discharge status: Home / Self Care | Payer: Medicare Other | Source: Ambulatory Visit | Attending: Internal Medicine | Admitting: Internal Medicine

## 2014-08-30 DIAGNOSIS — Z5189 Encounter for other specified aftercare: Secondary | ICD-10-CM | POA: Diagnosis not present

## 2014-08-30 NOTE — Progress Notes (Addendum)
Today, Valerie Gay exercised at [facility]. Service time was from 1030 to 1230.  The patient exercised for more than 31 minutes performing aerobic, strengthening, and stretching exercises. Oxygen saturation, heart rate, blood pressure, rate of perceived exertion, and shortness of breath were all monitored before, during, and after exercise. Valerie Gay presented with no problems at today's exercise session. Valerie Gay also attended an education session on oxygen use and safety.  There was no workload change during today's exercise session.  Pre-exercise vitals:   Weight kg: 69.8   Liters of O2: ra   SpO2: 94   HR: 83   BP: 126/64   CBG: na  Exercise vitals:   Highest heartrate:  124 down to 101 with rest break   Lowest oxygen saturation: 93   Highest blood pressure: 134/70   Liters of 02: ra  Post-exercise vitals:   SpO2: 96   HR: 77   BP: 122/70   Liters of O2: ra   CBG: na  Dr. Brand Males, Medical Director Dr. Aileen Fass is immediately available during today's Pulmonary Rehab session for Valerie Gay on 08/30/2014 at 1030 class time.

## 2014-09-04 ENCOUNTER — Encounter (HOSPITAL_COMMUNITY)
Admission: RE | Admit: 2014-09-04 | Discharge: 2014-09-04 | Disposition: A | Discharge status: Home / Self Care | Payer: Medicare Other | Source: Ambulatory Visit | Attending: Internal Medicine | Admitting: Internal Medicine

## 2014-09-04 DIAGNOSIS — Z5189 Encounter for other specified aftercare: Secondary | ICD-10-CM | POA: Diagnosis not present

## 2014-09-04 NOTE — Progress Notes (Signed)
Today, Valerie Gay exercised at [facility]. Service time was from 10:30am to 12:15pm.  The patient exercised for more than 31 minutes performing aerobic, strengthening, and stretching exercises. Oxygen saturation, heart rate, blood pressure, rate of perceived exertion, and shortness of breath were all monitored before, during, and after exercise. Valerie Gay presented with no problems at today's exercise session.   There was no workload change during today's exercise session.  Pre-exercise vitals:   Weight kg: 70.6   Liters of O2: ra   SpO2: 98   HR: 92   BP: 112/72   CBG: na  Exercise vitals:   Highest heartrate:  116   Lowest oxygen saturation: 92   Highest blood pressure:    Liters of 02: ra  Post-exercise vitals:   SpO2: 97   HR: 79   BP: 102/78   Liters of O2: ra   CBG: na  Dr. Brand Males, Medical Director Dr. Aileen Fass is immediately available during today's Pulmonary Rehab session for Valerie Gay on 09/04/14 at 10:30am class time.

## 2014-09-06 ENCOUNTER — Encounter (HOSPITAL_COMMUNITY)
Admission: RE | Admit: 2014-09-06 | Discharge: 2014-09-06 | Disposition: A | Discharge status: Home / Self Care | Payer: Medicare Other | Source: Ambulatory Visit | Attending: Internal Medicine | Admitting: Internal Medicine

## 2014-09-06 DIAGNOSIS — Z5189 Encounter for other specified aftercare: Secondary | ICD-10-CM | POA: Diagnosis not present

## 2014-09-06 NOTE — Progress Notes (Signed)
Today, Valerie Gay exercised at [facility]. Service time was from 10:30am to 12:30pm.  The patient exercised for more than 31 minutes performing aerobic, strengthening, and stretching exercises. Oxygen saturation, heart rate, blood pressure, rate of perceived exertion, and shortness of breath were all monitored before, during, and after exercise. Valerie Gay presented with no problems at today's exercise session. The patient attended education class today with [facility] regarding Oxygen Use and Delivery Systems.  There was no workload change during today's exercise session.  Pre-exercise vitals:   Weight kg: 70.3   Liters of O2: ra   SpO2: 97   HR: 76   BP: 104/60   CBG: na  Exercise vitals:   Highest heartrate:  109   Lowest oxygen saturation: 94   Highest blood pressure: 116/60   Liters of 02: ra  Post-exercise vitals:   SpO2: 96   HR: 84   BP: 100/60   Liters of O2: ra   CBG: na  Dr. Brand Males, Medical Director Dr. Wynelle Cleveland is immediately available during today's Pulmonary Rehab session for Valerie Gay on 09/06/14 at 10:30am class time.

## 2014-09-11 ENCOUNTER — Encounter (HOSPITAL_COMMUNITY)
Admission: RE | Admit: 2014-09-11 | Discharge: 2014-09-11 | Disposition: A | Discharge status: Home / Self Care | Payer: Medicare Other | Source: Ambulatory Visit | Attending: Internal Medicine | Admitting: Internal Medicine

## 2014-09-11 DIAGNOSIS — Z5189 Encounter for other specified aftercare: Secondary | ICD-10-CM | POA: Diagnosis not present

## 2014-09-11 NOTE — Progress Notes (Signed)
[name] completed a Six-Minute Walk Test on 09/11/14 . [name] walked 1,128 feet with 0 breaks.  The patient's lowest oxygen saturation was 88% , highest heart rate was 120 , and highest blood pressure was 124/66. The patient was on room air.  Valerie Gay stated that nothing hindered their walk test.

## 2014-09-13 ENCOUNTER — Encounter (HOSPITAL_COMMUNITY): Payer: Medicare Other

## 2014-09-14 NOTE — Progress Notes (Signed)
Discharge Note: Valerie Gay has been discharged from Pulmonary Rehabilitation after successful completions of the program. Valerie Gay completed 23 exercise and education sessions. Valerie Gay tolerated an increased in workloads and verbalized that her strength and stamina had increased. At discharge, she was ambulating 13 laps around the track in 15 min and exercising at a load of 6 on the NuStep. She increased her 6 min walk test footage by 114 feet. Valerie Gay accomplished her biggest goal of the program, to quit smoking and remain smoke free. She also learned more about her pulmonary disease. Valerie Gay was a pleasure to have in our program.

## 2014-09-14 NOTE — Addendum Note (Signed)
Encounter addended by: Willadean Carol, RN on: 09/14/2014  4:45 PM<BR>     Documentation filed: Notes Section

## 2014-10-10 ENCOUNTER — Telehealth: Payer: Self-pay | Admitting: Internal Medicine

## 2014-10-10 MED ORDER — ALBUTEROL SULFATE (2.5 MG/3ML) 0.083% IN NEBU: 2.5000 mg | INHALATION_SOLUTION | Freq: Three times a day (TID) | RESPIRATORY_TRACT | Status: DC | PRN

## 2014-10-10 NOTE — Telephone Encounter (Signed)
rx sent. Patient notified.  Nothing further needed.

## 2014-10-15 ENCOUNTER — Ambulatory Visit: Payer: Medicare Other | Admitting: Internal Medicine

## 2014-11-01 DIAGNOSIS — H3531 Nonexudative age-related macular degeneration: Secondary | ICD-10-CM | POA: Diagnosis not present

## 2014-11-01 DIAGNOSIS — H43811 Vitreous degeneration, right eye: Secondary | ICD-10-CM | POA: Diagnosis not present

## 2014-11-01 DIAGNOSIS — H3532 Exudative age-related macular degeneration: Secondary | ICD-10-CM | POA: Diagnosis not present

## 2014-11-20 ENCOUNTER — Ambulatory Visit (INDEPENDENT_AMBULATORY_CARE_PROVIDER_SITE_OTHER)
Admission: RE | Admit: 2014-11-20 | Discharge: 2014-11-20 | Disposition: A | Discharge status: Home / Self Care | Payer: Medicare Other | Source: Ambulatory Visit | Attending: Internal Medicine | Admitting: Internal Medicine

## 2014-11-20 DIAGNOSIS — J439 Emphysema, unspecified: Secondary | ICD-10-CM | POA: Diagnosis not present

## 2014-11-27 ENCOUNTER — Other Ambulatory Visit: Payer: Self-pay | Admitting: Internal Medicine

## 2014-11-27 DIAGNOSIS — R911 Solitary pulmonary nodule: Secondary | ICD-10-CM

## 2014-11-28 ENCOUNTER — Ambulatory Visit (INDEPENDENT_AMBULATORY_CARE_PROVIDER_SITE_OTHER): Payer: Medicare Other | Admitting: Internal Medicine

## 2014-11-28 ENCOUNTER — Encounter: Payer: Self-pay | Admitting: Internal Medicine

## 2014-11-28 VITALS — BP 134/72 | HR 89 | Ht 64.0 in | Wt 154.0 lb

## 2014-11-28 DIAGNOSIS — J449 Chronic obstructive pulmonary disease, unspecified: Secondary | ICD-10-CM | POA: Diagnosis not present

## 2014-11-28 DIAGNOSIS — F17200 Nicotine dependence, unspecified, uncomplicated: Secondary | ICD-10-CM

## 2014-11-28 DIAGNOSIS — R911 Solitary pulmonary nodule: Secondary | ICD-10-CM

## 2014-11-28 DIAGNOSIS — Z720 Tobacco use: Secondary | ICD-10-CM

## 2014-11-28 NOTE — Progress Notes (Signed)
Subjective:    Patient ID: Valerie Gay, female    DOB: [birth year], 74 y.o.   MRN: [id number]  HPI  #COPD - moderate - gold stage 2; borderline gold stg 3 #Smoking # RUL lung nodule - 89mm  June 2015     OV 11/28/2014  Chief Complaint  Patient presents with  . Follow-up    Pt c/o stable sob with exertion.  CAT score 17.  #COPD - moderate - gold stage 2; borderline gold stg 3  - Do to supposedly allergy from Spiriva she is now only on Symbicort. Overall COPD stable. COPD cat score is 17. Pulmonary rehabilitation initiation phase has helped her. She is going to start the maintenance phase. She is up-to-date with her vaccines. She thinks one more inhaler can help her. Not interested in research studies.   #Smoking: Continues to smoke refuses to quit   # RUL lung nodule - 70mm  June 2015 . Follow-up CT scan of the chest and January 2016 is stable.   Past, Family, Social reviewed: no change since last visit   Review of Systems  Constitutional: Negative.   HENT: Negative.   Eyes: Negative.   Respiratory: Positive for apnea, cough and shortness of breath. Negative for choking, chest tightness, wheezing and stridor.   Cardiovascular: Negative.   Gastrointestinal: Negative.   Endocrine: Negative.   Genitourinary: Negative.   Musculoskeletal: Negative.   Skin: Negative.   Allergic/Immunologic: Negative.   Neurological: Negative.   Hematological: Negative.   Psychiatric/Behavioral: Negative.      Current outpatient prescriptions:  .  albuterol (PROVENTIL HFA;VENTOLIN HFA) 108 (90 BASE) MCG/ACT inhaler, Inhale 2 puffs into the lungs every 6 (six) hours as needed for wheezing or shortness of breath., Disp: 1 Inhaler, Rfl: 2 .  albuterol (PROVENTIL) (2.5 MG/3ML) 0.083% nebulizer solution, Take 3 mLs (2.5 mg total) by nebulization every 8 (eight) hours as needed for wheezing or shortness of breath., Disp: 75 mL, Rfl: 2 .  budesonide-formoterol (SYMBICORT) 160-4.5 MCG/ACT  inhaler, Inhale 2 puffs into the lungs 2 (two) times daily., Disp: 1 Inhaler, Rfl: 5     Objective:   Physical Exam  Constitutional: She is oriented to person, place, and time. She appears well-developed and well-nourished. No distress.  HENT:  Head: Normocephalic and atraumatic.  Right Ear: External ear normal.  Left Ear: External ear normal.  Mouth/Throat: Oropharynx is clear and moist. No oropharyngeal exudate.  Eyes: Conjunctivae and EOM are normal. Pupils are equal, round, and reactive to light. Right eye exhibits no discharge. Left eye exhibits no discharge. No scleral icterus.  Neck: Normal range of motion. Neck supple. No JVD present. No tracheal deviation present. No thyromegaly present.  Cardiovascular: Normal rate, regular rhythm, normal heart sounds and intact distal pulses.  Exam reveals no gallop and no friction rub.   No murmur heard. Pulmonary/Chest: Effort normal and breath sounds normal. No respiratory distress. She has no wheezes. She has no rales. She exhibits no tenderness.  Mild barrel chest  Abdominal: Soft. Bowel sounds are normal. She exhibits no distension and no mass. There is no tenderness. There is no rebound and no guarding.  Musculoskeletal: Normal range of motion. She exhibits no edema or tenderness.  Lymphadenopathy:    She has no cervical adenopathy.  Neurological: She is alert and oriented to person, place, and time. She has normal reflexes. No cranial nerve deficit. She exhibits normal muscle tone. Coordination normal.  Skin: Skin is warm and dry. No rash noted. She  is not diaphoretic. No erythema. No pallor.  Psychiatric: She has a normal mood and affect. Her behavior is normal. Judgment and thought content normal.  Vitals reviewed.   Filed Vitals:   11/28/14 1637  BP: 134/72  Pulse: 89  Height: 5\' 4"  (1.626 m)  Weight: 154 lb (69.854 kg)  SpO2: 95%         Assessment & Plan:     ICD-9-CM ICD-10-CM   1. COPD, moderate 496 J44.9   2.  Tobacco use disorder 305.1 Z72.0   3. Nodule of right lung 793.11 R91.1      #COPD - current stable  - you have moderate copd - half of normal lung function - continue  symbicort daily - start INCRUSE once daily (due to spiriva allergy)  - continue oxygen at night and during day for any exertion above 100 yards -continue maintenance rehab - glad you are uptodate with vaccines  #smoking - to bad you smoking again; please quit or lungs will get worse  #Right lung nodule  June 2015 -  - stable  Jan 2016 - repeat CT chest in 9 months - OCt 2016  -   #Followup  9  months or sooner if needed after CT chest

## 2014-11-28 NOTE — Patient Instructions (Addendum)
ICD-9-CM ICD-10-CM   1. COPD, moderate 496 J44.9   2. Tobacco use disorder 305.1 Z72.0   3. Nodule of right lung 793.11 R91.1      #COPD - current stable  - you have moderate copd - half of normal lung function - continue  symbicort daily - start INCRUSE once daily (due to spiriva allergy)  - continue oxygen at night and during day for any exertion above 100 yards -continue maintenance rehab - glad you are uptodate with vaccines  #smoking - to bad you smoking again; please quit or lungs will get worse  #Right lung nodule  June 2015 -  - stable  Jan 2016 - repeat CT chest in 9 months - OCt 2016  -   #Followup  9  months or sooner if needed after CT chest

## 2014-12-10 ENCOUNTER — Telehealth: Payer: Self-pay | Admitting: Internal Medicine

## 2014-12-10 ENCOUNTER — Other Ambulatory Visit: Payer: Self-pay | Admitting: Internal Medicine

## 2014-12-10 MED ORDER — ALBUTEROL SULFATE (2.5 MG/3ML) 0.083% IN NEBU: 2.5000 mg | INHALATION_SOLUTION | Freq: Three times a day (TID) | RESPIRATORY_TRACT | Status: DC | PRN

## 2014-12-10 NOTE — Telephone Encounter (Signed)
Spoke with pt and is aware RX sent in. Nothing further needed

## 2014-12-13 DIAGNOSIS — H3532 Exudative age-related macular degeneration: Secondary | ICD-10-CM | POA: Diagnosis not present

## 2014-12-25 ENCOUNTER — Telehealth: Payer: Self-pay | Admitting: Internal Medicine

## 2014-12-25 MED ORDER — UMECLIDINIUM BROMIDE 62.5 MCG/INH IN AEPB: 1.0000 | INHALATION_SPRAY | Freq: Every day | RESPIRATORY_TRACT | Status: DC

## 2014-12-25 NOTE — Telephone Encounter (Signed)
Spoke with pt, needs incruse refill sent to pharmacy.  While this is not on pt's med list, it is noted at last ov to try incruse 1 puff qd.  This has been sent to pharmacy.  Nothing further needed.

## 2015-01-11 ENCOUNTER — Other Ambulatory Visit: Payer: Self-pay | Admitting: Adult Health

## 2015-01-25 ENCOUNTER — Telehealth: Payer: Self-pay | Admitting: Internal Medicine

## 2015-01-25 NOTE — Telephone Encounter (Signed)
Per 11/28/14 OV w/ MR: #COPD - current stable  - you have moderate copd - half of normal lung function - continue  symbicort daily - start INCRUSE once daily (due to spiriva allergy)  - continue oxygen at night and during day for any exertion above 100 yards -continue maintenance rehab - glad you are uptodate with vaccines --  Called spoke with pt. She reports she received a card from [facility] stating she needs an appt with MR or medicare will no longer pay for her O2. She reports the card states between march and April. Pt just seen in jan 2016. Staff message sent to [facility] to see what is going on. Will await response.

## 2015-01-30 ENCOUNTER — Telehealth: Payer: Self-pay | Admitting: Internal Medicine

## 2015-01-30 NOTE — Telephone Encounter (Signed)
Spoke with [name] at [facility], states that Valerie Gay's visit fell just out of Medicare guidelines to cover 02.  States that [facility] is trying to work on getting this covered for the Valerie Gay.  Worse case scenario Valerie Gay will have to come in for a qualifying walk visit in our office, but it is not known if it is needed at this time.  [facility] will keep [place] updated once they know if the 02 will be covered.    Spoke with Valerie Gay, she is aware.

## 2015-02-22 ENCOUNTER — Other Ambulatory Visit: Payer: Self-pay | Admitting: Emergency Medicine

## 2015-02-22 ENCOUNTER — Telehealth: Payer: Self-pay | Admitting: Internal Medicine

## 2015-02-22 MED ORDER — UMECLIDINIUM BROMIDE 62.5 MCG/INH IN AEPB: 1.0000 | INHALATION_SPRAY | Freq: Every day | RESPIRATORY_TRACT | Status: DC

## 2015-02-22 MED ORDER — BUDESONIDE-FORMOTEROL FUMARATE 160-4.5 MCG/ACT IN AERO: 2.0000 | INHALATION_SPRAY | Freq: Two times a day (BID) | RESPIRATORY_TRACT | Status: DC

## 2015-02-22 NOTE — Telephone Encounter (Signed)
Rx has been sent in. Pt is aware. Nothing further was needed. 

## 2015-02-26 ENCOUNTER — Encounter: Payer: Self-pay | Admitting: Adult Health

## 2015-02-26 ENCOUNTER — Ambulatory Visit (INDEPENDENT_AMBULATORY_CARE_PROVIDER_SITE_OTHER): Payer: Medicare Other | Admitting: Adult Health

## 2015-02-26 VITALS — BP 132/76 | HR 87 | Temp 97.6°F | Ht 64.0 in | Wt 155.2 lb

## 2015-02-26 DIAGNOSIS — R911 Solitary pulmonary nodule: Secondary | ICD-10-CM

## 2015-02-26 DIAGNOSIS — J449 Chronic obstructive pulmonary disease, unspecified: Secondary | ICD-10-CM

## 2015-02-26 MED ORDER — UMECLIDINIUM BROMIDE 62.5 MCG/INH IN AEPB: 1.0000 | INHALATION_SPRAY | Freq: Every day | RESPIRATORY_TRACT | Status: DC

## 2015-02-26 NOTE — Assessment & Plan Note (Signed)
Continue on current regimen Wear oxygen 3 L with activity and at bedtime Follow-up CT chest in September as planned Follow Dr. Chase Caller as planned Continue  to work on smoking cessation

## 2015-02-26 NOTE — Progress Notes (Signed)
Subjective:    Patient ID: Valerie Gay, female    DOB: [birth year], 74 y.o.   MRN: [id number]  HPI  #COPD - moderate - gold stage 2; borderline gold stg 3 #Smoking # RUL lung nodule - 63mm  June 2015     OV 11/28/2014  Chief Complaint  Patient presents with  . Follow-up    Pt c/o stable sob with exertion.  CAT score 17.  #COPD - moderate - gold stage 2; borderline gold stg 3  - Do to supposedly allergy from Spiriva she is now only on Symbicort. Overall COPD stable. COPD cat score is 17. Pulmonary rehabilitation initiation phase has helped her. She is going to start the maintenance phase. She is up-to-date with her vaccines. She thinks one more inhaler can help her. Not interested in research studies.   #Smoking: Continues to smoke refuses to quit   # RUL lung nodule - 64mm  June 2015 . Follow-up CT scan of the chest and January 2016 is stable.    02/26/2015 Follow up /O2 qualification  Patient returns for a two-month follow-up for COPD. She is maintained on Symbicort and INCRUSE.  She is here today for oxygen qualification. Patient does desaturate with walking to 88% on room air. She is on 3 L with activity and at bedtime.. She says overall her breathing is stable without flare of cough or shortness of breath. She denies any hemoptysis, chest pain, orthopnea, PND or leg swelling She has a known right upper lobe lung nodule 5 mm which was stable on follow-up CT chest. She has a follow-up CT chest in September 2016. She does continue to smoke, smoking cessation was discussed.    Review of Systems  Constitutional: Negative.   HENT: Negative.   Eyes: Negative.   RespiratoryNegative for choking, chest tightness, wheezing and stridor.   Cardiovascular: Negative.   Gastrointestinal: Negative.   Endocrine: Negative.   Genitourinary: Negative.   Musculoskeletal: Negative.   Skin: Negative.   Allergic/Immunologic: Negative.   Neurological: Negative.   Hematological:  Negative.   Psychiatric/Behavioral: Negative.          Objective:   Physical Exam  Constitutional: She is oriented to person, place, and time. She appears well-developed and well-nourished. No distress.  HENT:  Head: Normocephalic and atraumatic.  Right Ear: External ear normal.  Left Ear: External ear normal.  Mouth/Throat: Oropharynx is clear and moist. No oropharyngeal exudate.  Eyes: Conjunctivae and EOM are normal. Pupils are equal, round, and reactive to light. Right eye exhibits no discharge. Left eye exhibits no discharge. No scleral icterus.  Neck: Normal range of motion. Neck supple. No JVD present. No tracheal deviation present. No thyromegaly present.  Cardiovascular: Normal rate, regular rhythm, normal heart sounds and intact distal pulses.  Exam reveals no gallop and no friction rub.   No murmur heard. Pulmonary/Chest: Effort normal and breath sounds normal. No respiratory distress. She has no wheezes. She has no rales. She exhibits no tenderness.  Mild barrel chest  Abdominal: Soft. Bowel sounds are normal. She exhibits no distension and no mass. There is no tenderness. There is no rebound and no guarding.  Musculoskeletal: Normal range of motion. She exhibits no edema or tenderness.  Lymphadenopathy:    She has no cervical adenopathy.  Neurological: She is alert and oriented to person, place, and time. She has normal reflexes. No cranial nerve deficit. She exhibits normal muscle tone. Coordination normal.  Skin: Skin is warm and dry. No rash noted.  She is not diaphoretic. No erythema. No pallor.  Psychiatric: She has a normal mood and affect. Her behavior is normal. Judgment and thought content normal.  Vitals reviewed.

## 2015-02-26 NOTE — Assessment & Plan Note (Signed)
Compensated on present regimen  Plan  Continue on current regimen Wear oxygen 3 L with activity and at bedtime Follow-up CT chest in September as planned Follow Dr. Chase Caller as planned Continue  to work on smoking cessation

## 2015-02-26 NOTE — Patient Instructions (Signed)
Continue on current regimen Wear oxygen 3 L with activity and at bedtime Follow-up CT chest in September as planned Follow Dr. Chase Caller as planned Continue  to work on smoking cessation

## 2015-02-26 NOTE — Addendum Note (Signed)
Addended by: Parke Poisson E on: 02/26/2015 05:40 PM   Modules accepted: Orders

## 2015-03-14 DIAGNOSIS — H3531 Nonexudative age-related macular degeneration: Secondary | ICD-10-CM | POA: Diagnosis not present

## 2015-07-11 ENCOUNTER — Telehealth: Payer: Self-pay | Admitting: Internal Medicine

## 2015-07-12 MED ORDER — ALBUTEROL SULFATE HFA 108 (90 BASE) MCG/ACT IN AERS: 2.0000 | INHALATION_SPRAY | Freq: Four times a day (QID) | RESPIRATORY_TRACT | Status: DC | PRN

## 2015-07-12 NOTE — Telephone Encounter (Signed)
Called and spoke to pt. Pt requesting albuterol hfa refill. Rx sent to preferred pharmacy. Pt verbalized understanding and denied any further questions or concerns at this time.

## 2015-07-16 DIAGNOSIS — H3531 Nonexudative age-related macular degeneration: Secondary | ICD-10-CM | POA: Diagnosis not present

## 2015-07-16 DIAGNOSIS — H532 Diplopia: Secondary | ICD-10-CM | POA: Diagnosis not present

## 2015-08-07 ENCOUNTER — Other Ambulatory Visit: Payer: Self-pay | Admitting: Internal Medicine

## 2015-08-19 ENCOUNTER — Other Ambulatory Visit: Payer: Self-pay | Admitting: Internal Medicine

## 2015-08-26 ENCOUNTER — Telehealth: Payer: Self-pay | Admitting: Internal Medicine

## 2015-08-26 DIAGNOSIS — R911 Solitary pulmonary nodule: Secondary | ICD-10-CM

## 2015-08-26 NOTE — Telephone Encounter (Signed)
Spoke with pt. Order has been placed for CT. ROV has been scheduled for 09/27/15 at 3:15pm. Nothing further was needed.

## 2015-09-18 ENCOUNTER — Ambulatory Visit (INDEPENDENT_AMBULATORY_CARE_PROVIDER_SITE_OTHER)
Admission: RE | Admit: 2015-09-18 | Discharge: 2015-09-18 | Disposition: A | Discharge status: Home / Self Care | Payer: Medicare Other | Source: Ambulatory Visit | Attending: Internal Medicine | Admitting: Internal Medicine

## 2015-09-18 DIAGNOSIS — R911 Solitary pulmonary nodule: Secondary | ICD-10-CM | POA: Diagnosis not present

## 2015-09-19 ENCOUNTER — Telehealth: Payer: Self-pay | Admitting: Internal Medicine

## 2015-09-19 NOTE — Telephone Encounter (Signed)
Lung nodule is stable since June 2015. Next CT scan of the chest is in one year in November 2017. I will discuss this with her at follow-up 09/27/2015. Sending this message to you Valerie Gay  in for in case patient calls. Closing encounter. No need to reply   Ct Chest Wo Contrast  09/18/2015  CLINICAL DATA:  Right lung nodule. EXAM: CT CHEST WITHOUT CONTRAST TECHNIQUE: Multidetector CT imaging of the chest was performed following the standard protocol without IV contrast. COMPARISON:  11/20/2014 and 05/15/2014. FINDINGS: Mediastinum/Nodes: Mediastinal lymph nodes are not enlarged by CT size criteria. Hilar regions are difficult to definitively evaluate without IV contrast. No axillary adenopathy. Atherosclerotic calcification of the arterial vasculature, including three-vessel involvement of the coronary arteries. Heart size normal. No pericardial effusion. Lungs/Pleura: Moderate to severe centrilobular emphysema. A 5 mm linear subpleural nodule in the apical right upper lobe is unchanged from 05/15/2014. Scarring in the lingula. Lungs are otherwise clear. No pleural fluid. Airway is unremarkable. Upper abdomen: Low-attenuation lesion in the right hepatic lobe measures 6 mm, stable, possibly a cyst or biliary hamartoma. Cholecystectomy. Visualized portions of the adrenal glands are unremarkable. Tiny bilateral renal stones. 8 mm low-attenuation lesion off the left kidney is too small to characterize. Visualized portions of the spleen, pancreas, stomach and bowel are grossly unremarkable. No upper abdominal adenopathy. Musculoskeletal: No worrisome lytic or sclerotic lesions. Degenerative changes are seen in the spine. IMPRESSION: 1. 5 mm right upper lobe nodule is unchanged from 05/15/2014 and is therefore likely benign. Additionally, 18-24 months of documented stability is recommended. Additional follow-up could be performed in 12 months to ensure stability, as clinically indicated. This recommendation follows the  consensus statement: Guidelines for Management of Small Pulmonary Nodules Detected on CT Scans: A Statement from the [place] as published in Radiology 2005; 237:395-400. Online at: https://www.arnold.com/. 2. Moderate to severe centrilobular emphysema. 3. Three-vessel coronary artery calcification. 4. Tiny bilateral renal stones. Electronically Signed   By: Lorin Picket M.D.   On: 09/18/2015 12:31

## 2015-09-27 ENCOUNTER — Ambulatory Visit (INDEPENDENT_AMBULATORY_CARE_PROVIDER_SITE_OTHER): Payer: Medicare Other | Admitting: Internal Medicine

## 2015-09-27 ENCOUNTER — Encounter: Payer: Self-pay | Admitting: Internal Medicine

## 2015-09-27 VITALS — BP 132/70 | HR 78 | Ht 64.0 in | Wt 155.2 lb

## 2015-09-27 DIAGNOSIS — F17200 Nicotine dependence, unspecified, uncomplicated: Secondary | ICD-10-CM | POA: Diagnosis not present

## 2015-09-27 DIAGNOSIS — Z23 Encounter for immunization: Secondary | ICD-10-CM

## 2015-09-27 DIAGNOSIS — R911 Solitary pulmonary nodule: Secondary | ICD-10-CM | POA: Diagnosis not present

## 2015-09-27 DIAGNOSIS — J449 Chronic obstructive pulmonary disease, unspecified: Secondary | ICD-10-CM | POA: Diagnosis not present

## 2015-09-27 MED ORDER — ALBUTEROL SULFATE HFA 108 (90 BASE) MCG/ACT IN AERS: 2.0000 | INHALATION_SPRAY | Freq: Four times a day (QID) | RESPIRATORY_TRACT | Status: DC | PRN

## 2015-09-27 NOTE — Progress Notes (Signed)
Subjective:     Patient ID: Valerie Gay, female   DOB: [birth year], 74 y.o.   MRN: [id number]  HPI   OV 09/27/2015  Chief Complaint  Patient presents with  . Follow-up    Pt states her breathing is unchanged since last OV. Pt states she has intermittent chest tightness when SOB. Pt states her SOB is at baseline.  Pt denies cough.    #COPD - moderate - gold stage 2; borderline gold stg 3 #Smoking # RUL lung nodule - 70mm  June 2015     OV 11/28/2014  Chief Complaint  Patient presents with  . Follow-up    Pt c/o stable sob with exertion.  CAT score 17.  #COPD - moderate - gold stage 2; borderline gold stg 3  - Do to supposedly allergy from Spiriva she is now only on Symbicort. Overall COPD stable. COPD cat score is 17. Pulmonary rehabilitation initiation phase has helped her. She is going to start the maintenance phase. She is up-to-date with her vaccines. She thinks one more inhaler can help her. Not interested in research studies.   #Smoking: Continues to smoke refuses to quit   # RUL lung nodule - 65mm  June 2015 . Follow-up CT scan of the chest and January 2016 is stable.    02/26/2015 Follow up /O2 qualification  Patient returns for a two-month follow-up for COPD. She is maintained on Symbicort and INCRUSE.  She is here today for oxygen qualification. Patient does desaturate with walking to 88% on room air. She is on 3 L with activity and at bedtime.. She says overall her breathing is stable without flare of cough or shortness of breath. She denies any hemoptysis, chest pain, orthopnea, PND or leg swelling She has a known right upper lobe lung nodule 5 mm which was stable on follow-up CT chest. She has a follow-up CT chest in September 2016. She does continue to smoke, smoking cessation was discussed.  OV 09/27/2015  Chief Complaint  Patient presents with  . Follow-up    Pt states her breathing is unchanged since last OV. Pt states she has intermittent chest  tightness when SOB. Pt states her SOB is at baseline.  Pt denies cough.    COPD: stable. No issues. Doing well on triple MDI. NEeds flu shot   SMoking:  reports that she has been smoking Cigarettes.  She has a 30 pack-year smoking history. She has never used smokeless tobacco.   Lun gnodule  - personally visualizeed  IMPRESSION: 1. 5 mm right upper lobe nodule is unchanged from 05/15/2014 and is therefore likely benign. Additionally, 18-24 months of documented stability is recommended. Additional follow-up could be performed in 12 months to ensure stability, as clinically indicated. This recommendation follows the consensus statement: Guidelines for Management of Small Pulmonary Nodules Detected on CT Scans: A Statement from the [place] as published in Radiology 2005; 237:395-400. Online at: https://www.arnold.com/. 2. Moderate to severe centrilobular emphysema. 3. Three-vessel coronary artery calcification. 4. Tiny bilateral renal stones.   Electronically Signed  By: Lorin Picket M.D.  On: 09/18/2015 12:31    Current outpatient prescriptions:  .  albuterol (PROVENTIL HFA;VENTOLIN HFA) 108 (90 BASE) MCG/ACT inhaler, Inhale 2 puffs into the lungs every 6 (six) hours as needed for wheezing or shortness of breath., Disp: 1 Inhaler, Rfl: 2 .  albuterol (PROVENTIL) (2.5 MG/3ML) 0.083% nebulizer solution, INHALE CONTENTS OF 1 VIAL IN NEBULIZER EVERY 8 HOURS AS NEEDED FOR WHEEZING OR SHORTNESS OF BREATH, Disp:  300 vial, Rfl: 2 .  SYMBICORT 160-4.5 MCG/ACT inhaler, USE 2 INHALATIONS TWICE A DAY, Disp: 30.6 g, Rfl: 0 .  Umeclidinium Bromide (INCRUSE ELLIPTA) 62.5 MCG/INH AEPB, Inhale 1 puff into the lungs daily., Disp: 90 each, Rfl: 3  Immunization History  Administered Date(s) Administered  . H1N1 11/05/2008  . Influenza Whole 07/29/2014  . Pneumococcal Polysaccharide-23 03/28/2014    Allergies  Allergen Reactions  . Spiriva  Handihaler [Tiotropium Bromide Monohydrate] Other (See Comments)    Itching, rash, hives on upper body     Review of Systems According to HPI     Objective:   Physical Exam  Constitutional: She is oriented to person, place, and time. She appears well-developed and well-nourished. No distress.  HENT:  Head: Normocephalic and atraumatic.  Right Ear: External ear normal.  Left Ear: External ear normal.  Mouth/Throat: Oropharynx is clear and moist. No oropharyngeal exudate.  Eyes: Conjunctivae and EOM are normal. Pupils are equal, round, and reactive to light. Right eye exhibits no discharge. Left eye exhibits no discharge. No scleral icterus.  Neck: Normal range of motion. Neck supple. No JVD present. No tracheal deviation present. No thyromegaly present.  Cardiovascular: Normal rate, regular rhythm, normal heart sounds and intact distal pulses.  Exam reveals no gallop and no friction rub.   No murmur heard. Pulmonary/Chest: Effort normal and breath sounds normal. No respiratory distress. She has no wheezes. She has no rales. She exhibits no tenderness.  Abdominal: Soft. Bowel sounds are normal. She exhibits no distension and no mass. There is no tenderness. There is no rebound and no guarding.  Musculoskeletal: Normal range of motion. She exhibits no edema or tenderness.  Lymphadenopathy:    She has no cervical adenopathy.  Neurological: She is alert and oriented to person, place, and time. She has normal reflexes. No cranial nerve deficit. She exhibits normal muscle tone. Coordination normal.  Skin: Skin is warm and dry. No rash noted. She is not diaphoretic. No erythema. No pallor.  Psychiatric: She has a normal mood and affect. Her behavior is normal. Judgment and thought content normal.  Vitals reviewed.   Filed Vitals:   09/27/15 1539  BP: 132/70  Pulse: 78  Height: 5\' 4"  (1.626 m)  Weight: 155 lb 3.2 oz (70.398 kg)  SpO2: 94%        Assessment:       ICD-9-CM  ICD-10-CM   1. COPD, moderate ([place]) 496 J44.9   2. Nodule of right lung 793.11 R91.1 CT Chest Wo Contrast  3. Tobacco use disorder 305.1 F17.200        Plan:     #COPD modeate - current stable  continue  symbicort daily - continue INCRUSE once daily (due to spiriva allergy)  - continue oxygen at night and during day for any exertion above 100 yards -flu shot 09/27/2015   #smoking - to bad you smoking again; please quit or lungs will get worse  #Right lung nodule  June 2015 -  - stable  Jan 2016 and nov 2016 - repeat CT chest wo contrast - Nov 2017  -   #Followup  12 months from now but after CT chest  - return sooner if needed     Dr. Brand Males, M.D., [facility].C.P Pulmonary and Critical Care Medicine Staff Physician [facility] Pager: [phone], If no answer or between  15:00h - 7:00h: call [phone]  09/27/2015 4:14 PM

## 2015-09-27 NOTE — Patient Instructions (Addendum)
ICD-9-CM ICD-10-CM   1. COPD, moderate 496 J44.9   2. Tobacco use disorder 305.1 Z72.0   3. Nodule of right lung 793.11 R91.1      #COPD modeate - current stable  continue  symbicort daily - continue INCRUSE once daily (due to spiriva allergy)  - continue oxygen at night and during day for any exertion above 100 yards -flu shot 09/27/2015   #smoking - to bad you smoking again; please quit or lungs will get worse  #Right lung nodule  June 2015 -  - stable  Jan 2016 and nov 2016 - repeat CT chest wo contrast - Nov 2017  -   #Followup  12 months from now but after CT chest  - return sooner if needed

## 2015-10-01 ENCOUNTER — Telehealth: Payer: Self-pay | Admitting: Internal Medicine

## 2015-10-01 MED ORDER — ALBUTEROL SULFATE HFA 108 (90 BASE) MCG/ACT IN AERS: 2.0000 | INHALATION_SPRAY | Freq: Four times a day (QID) | RESPIRATORY_TRACT | Status: DC | PRN

## 2015-10-01 NOTE — Telephone Encounter (Signed)
Spoke with pt, needs albuterol to go through local pharmacy and not [facility].  This has been sent.  Nothing further needed.

## 2015-10-04 IMAGING — CR DG CHEST 2V
2 series · 2 of 2 positions shown · non-contrast
Comparison: 03/27/2014 and 09/26/2008

CLINICAL DATA: Pneumonia.  COPD.

EXAM:
CHEST  2 VIEW

[view not recorded (1 of 2)]
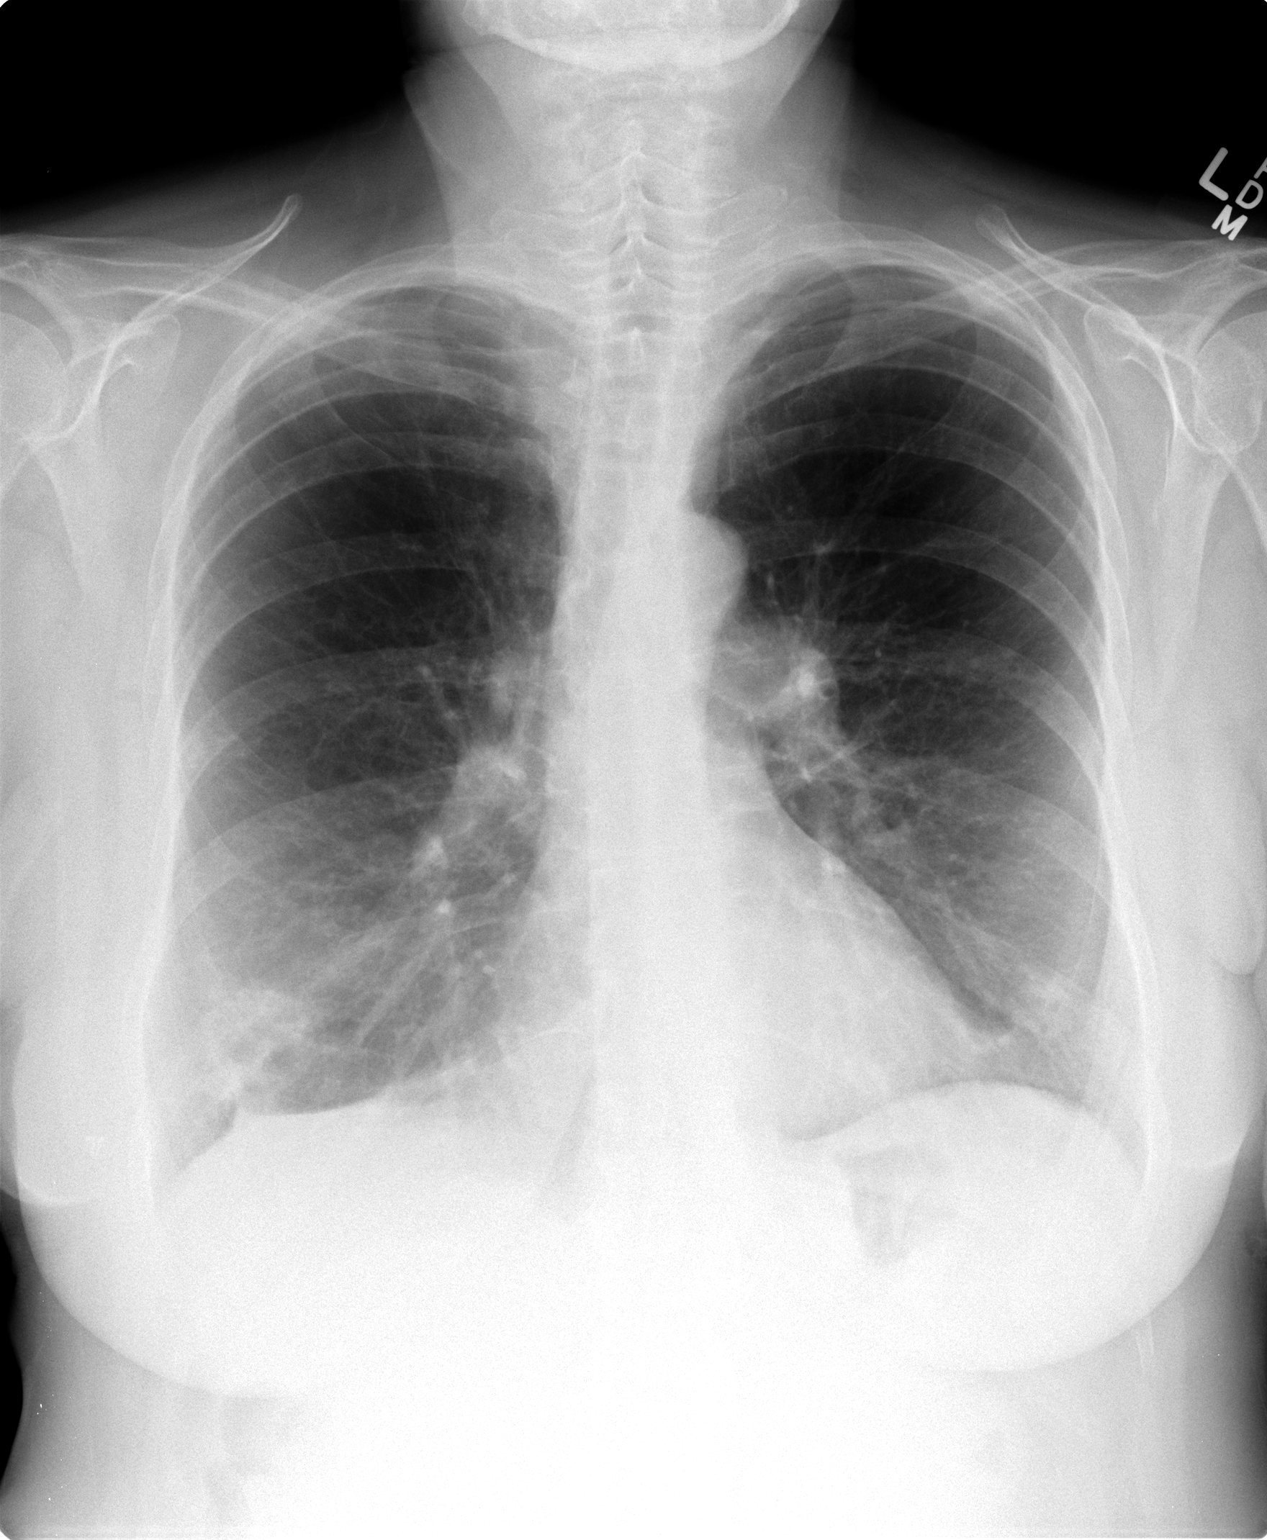

[view not recorded (2 of 2)]
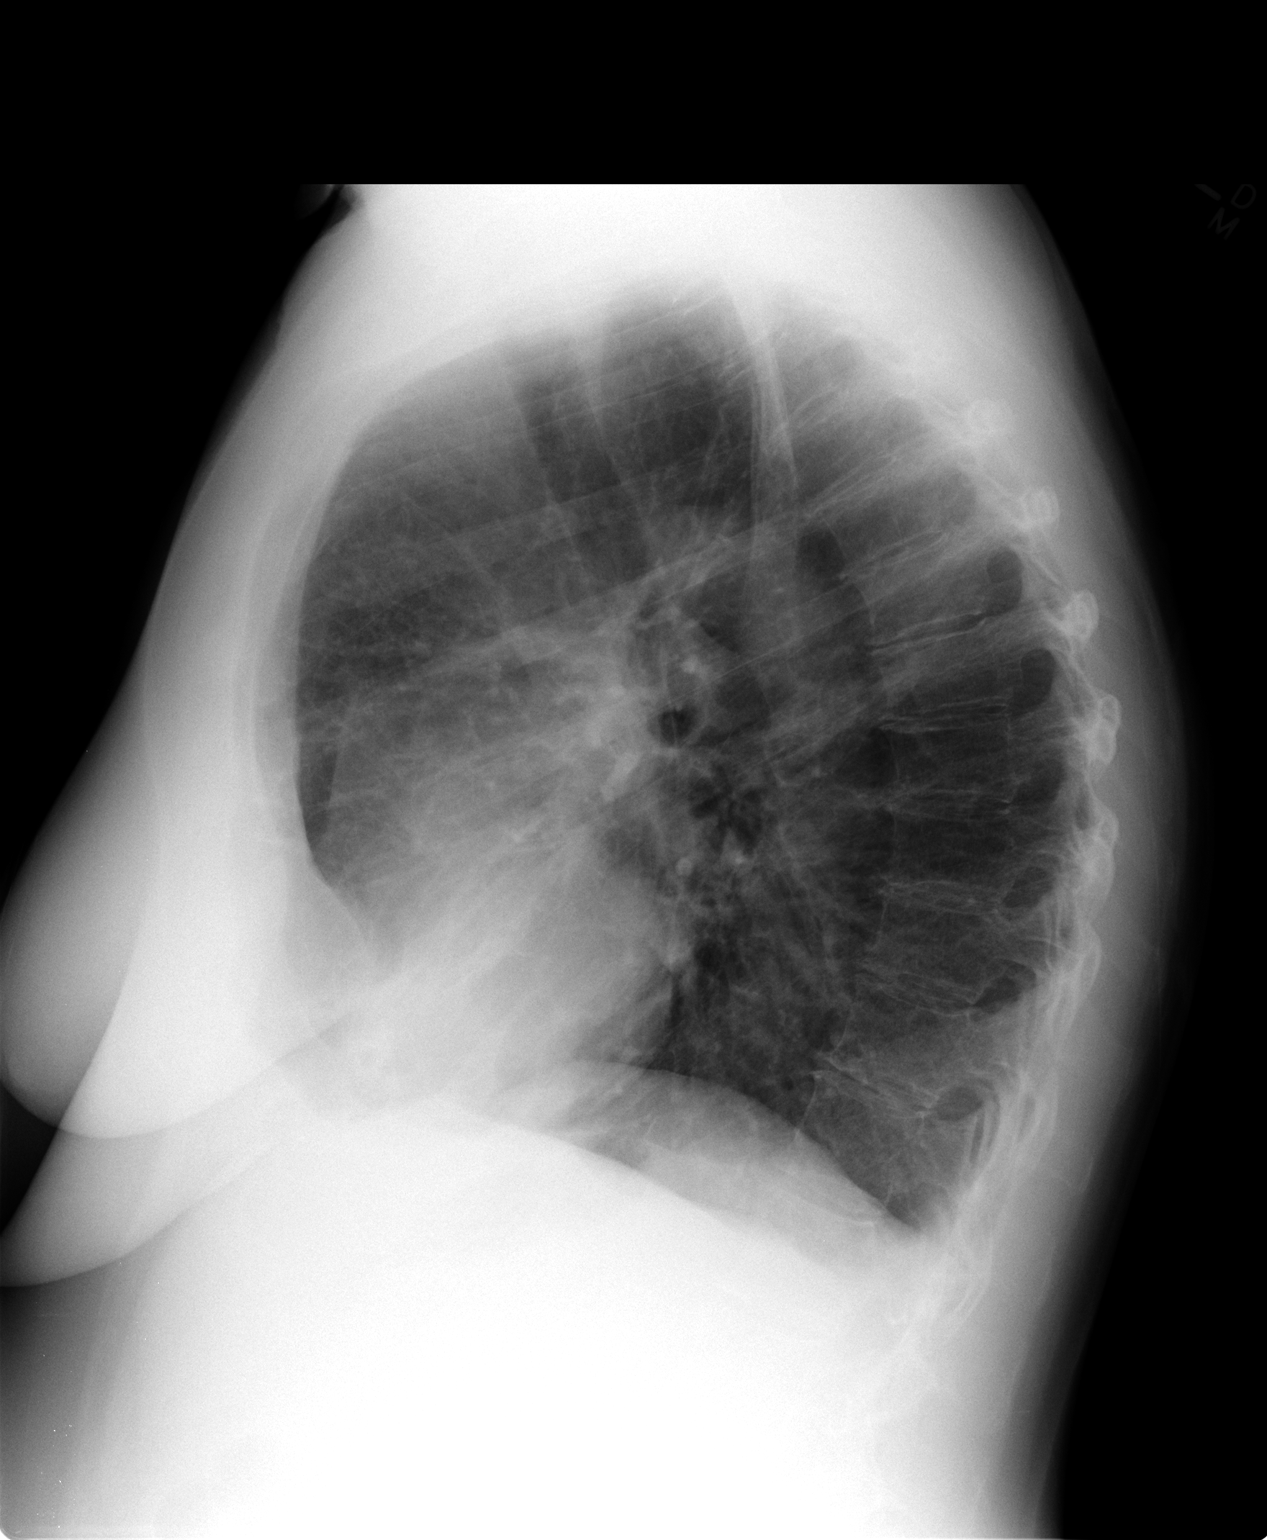

[2 of 2 positions shown; findings below may reference images not displayed]

FINDINGS: There has been partial clearing of the infiltrate in the right lung.
However, There is a more prominent 3 cm diameter area of increased
density with probable cavitation at the right lung base laterally.
There is a 2 cm area of density in possible cavitation at the left
lung base.

The heart size and pulmonary vascularity are normal. There is
increased lucency in the upper lobes consistent with emphysema.
There are no effusions. No acute osseous abnormalities.
IMPRESSION: 1. There are new cavitary lesions at both lung bases consistent with
cavitary pneumonia, most commonly seen with pneumococcal or
Klebsiella pneumonia.
2. Clearing of the interstitial infiltrates described on the prior
study.

## 2015-10-12 IMAGING — CT CT CHEST W/O CM
2 of 4 series · 15 of 36 positions shown, 18 images · non-contrast
Comparison: Chest x-ray 05/07/2014

CLINICAL DATA: Followup cavitary pneumonia.

EXAM:
CT CHEST WITHOUT CONTRAST
TECHNIQUE: Multidetector CT imaging of the chest was performed following the
standard protocol without IV contrast..

[Series 2: chest routine with · axial · 0.62mm/px · z∈[-278,-48]mm · 12 of 56 slices shown, 15 images]
[im 5/56  mediastinal]
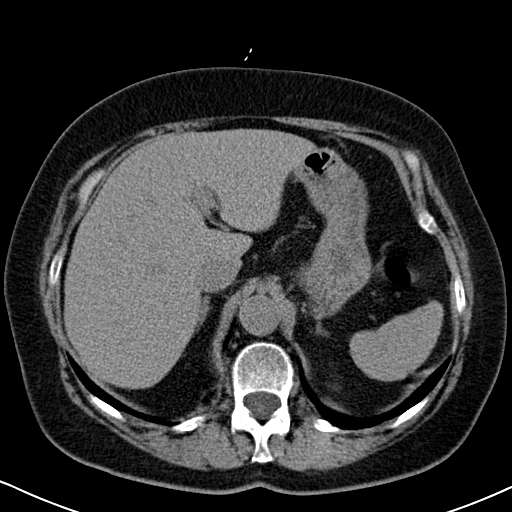
[im 5/56  lung]
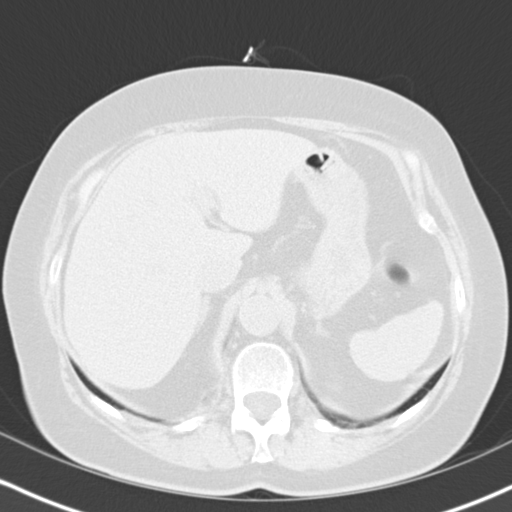
[im 9/56  lung]
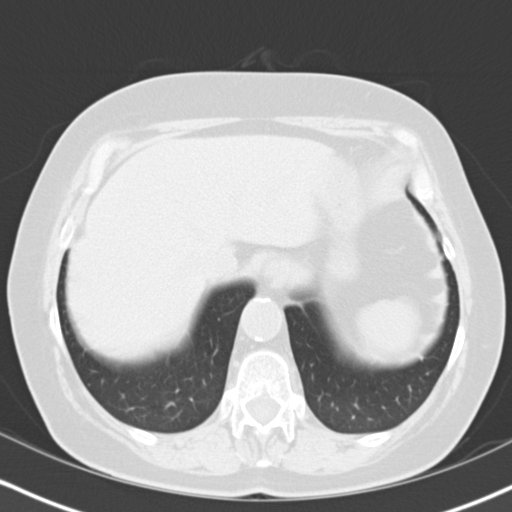
[im 13/56  lung]
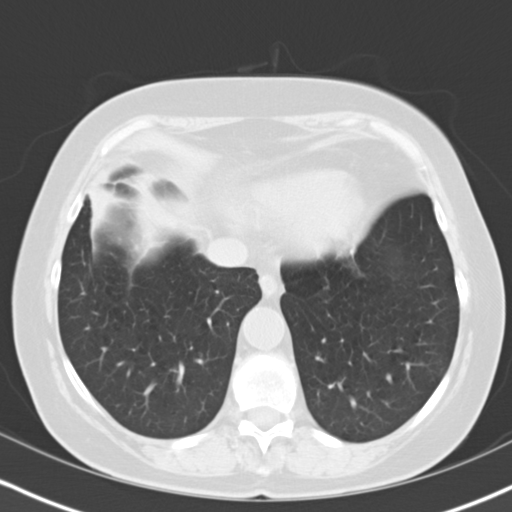
[im 17/56  lung]
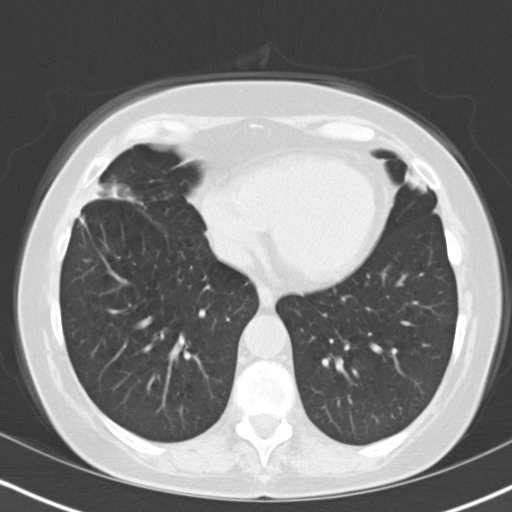
[im 22/56  mediastinal]
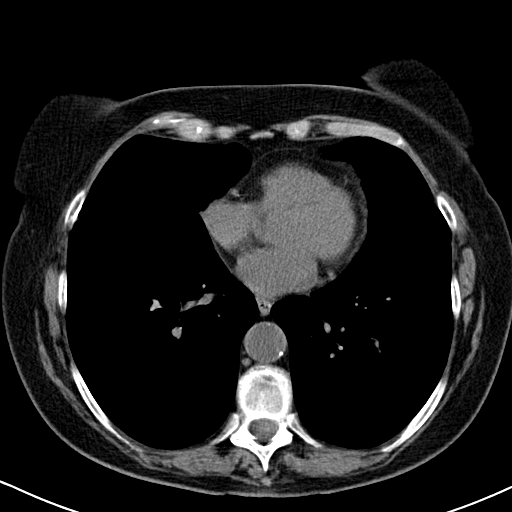
[im 22/56  lung]
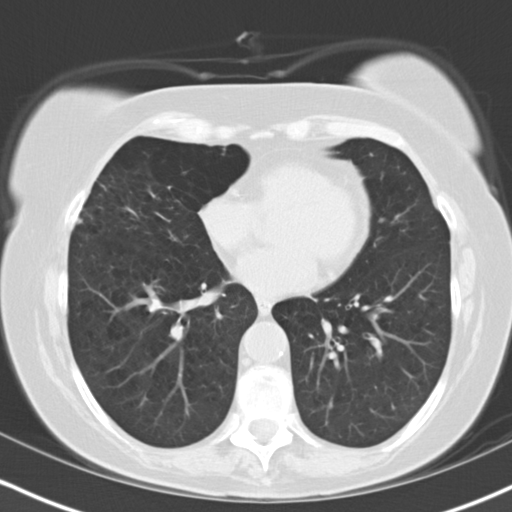
[im 26/56  lung]
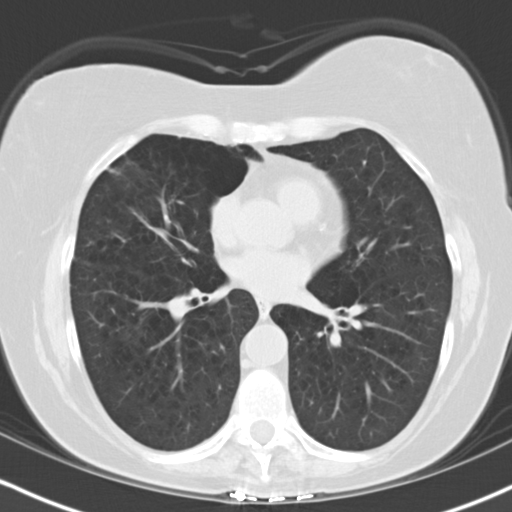
[im 30/56  lung]
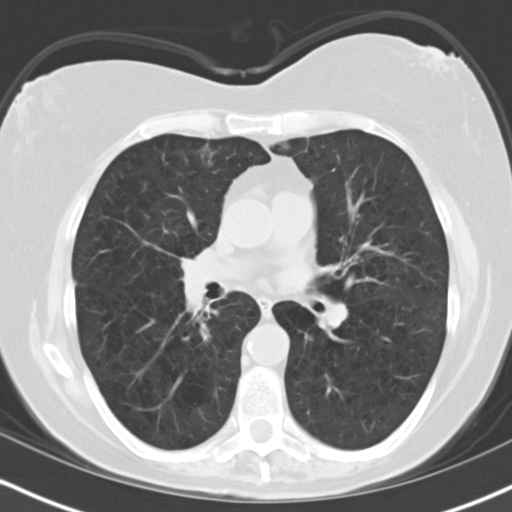
[im 34/56  lung]
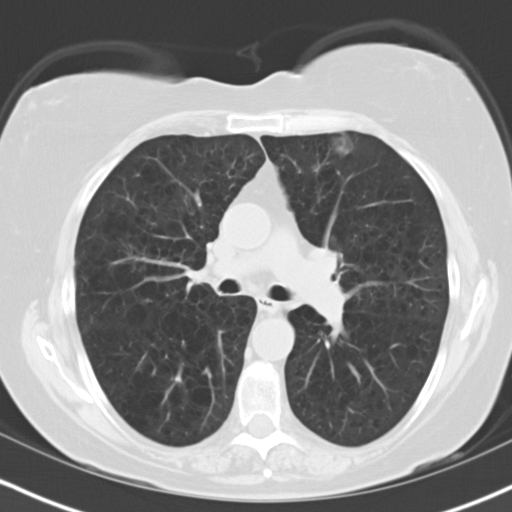
[im 39/56  mediastinal]
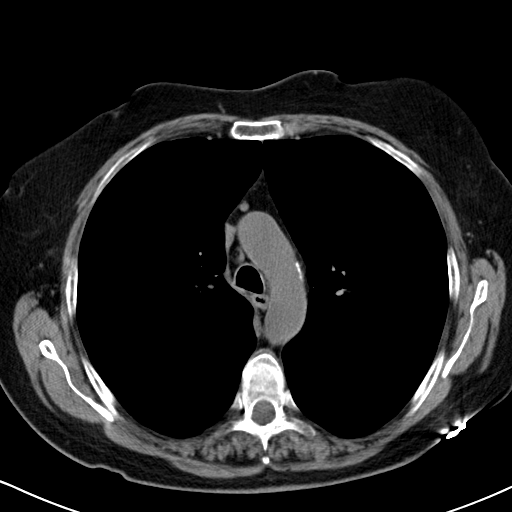
[im 39/56  lung]
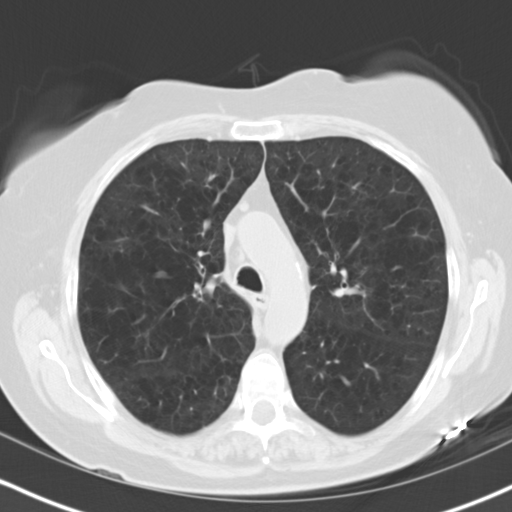
[im 43/56  lung]
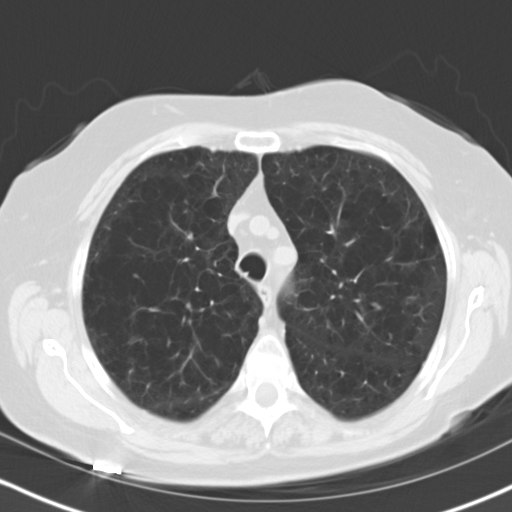
[im 47/56  lung]
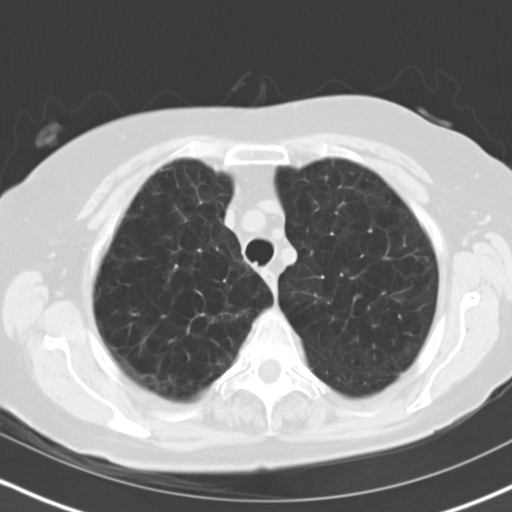
[im 51/56  lung]
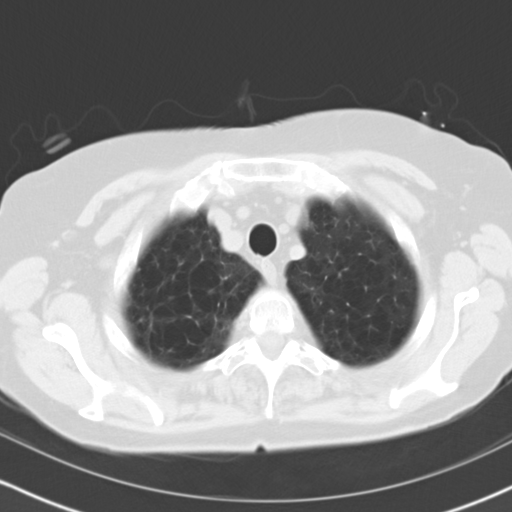

[Series 602: cor · coronal · 0.62mm/px · 3 of 112 slices shown]
[im 23/112  lung]
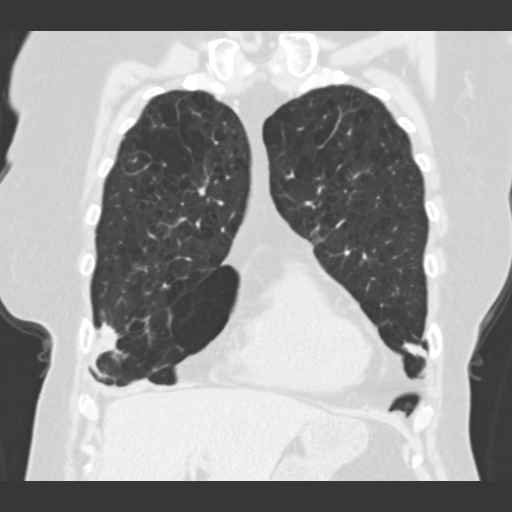
[im 45/112  lung]
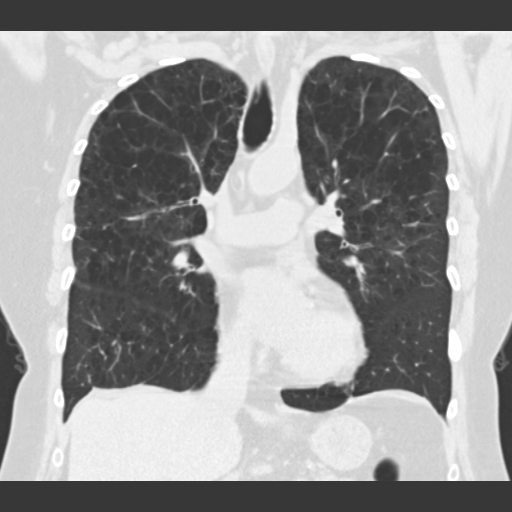
[im 67/112  lung]
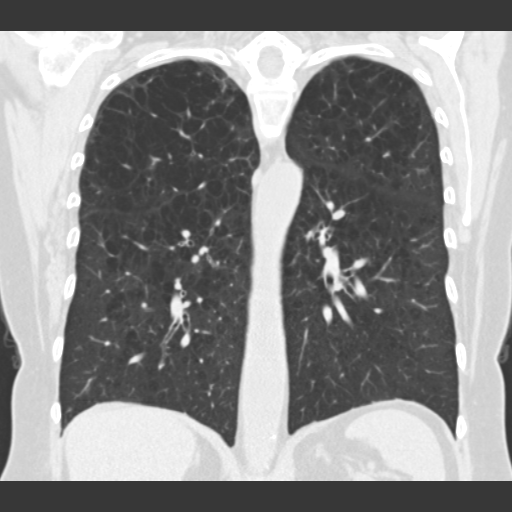

[15 of 36 positions shown; findings below may reference images not displayed]

FINDINGS: Moderate to severe centrilobular emphysema/COPD. There are airspace
opacities noted in the right middle lobe and lingula anteriorly,
corresponding to the density seen on prior chest x-ray. No
cavitation in these areas. While this could represent an area of
active inflammation, I favor this represents scarring. No other
confluent airspace opacities. No pleural effusions.

Heart is normal size. Aorta is normal caliber. Scattered coronary
artery and aortic calcifications. No mediastinal, hilar, or axillary
adenopathy. Chest wall soft tissues are unremarkable. Imaging into
the upper abdomen shows no acute findings.

No acute bony abnormality or focal bone lesion.
IMPRESSION: Moderate to severe centrilobular emphysema.

Predominately linear densities noted in the right middle lobe and
lingula corresponding to the density on prior chest x-ray. No
cavitation. I favor this represents scarring although residual
inflammatory process/pneumonia cannot be completely excluded. This
could be followed with repeat CT in the 3-6 months to ensure
stability or resolution.

Mild coronary artery disease.

## 2015-11-10 ENCOUNTER — Encounter (HOSPITAL_COMMUNITY): Payer: Self-pay | Admitting: Emergency Medicine

## 2015-11-10 DIAGNOSIS — R600 Localized edema: Secondary | ICD-10-CM | POA: Diagnosis not present

## 2015-11-10 DIAGNOSIS — T8189XA Other complications of procedures, not elsewhere classified, initial encounter: Secondary | ICD-10-CM | POA: Insufficient documentation

## 2015-11-10 DIAGNOSIS — Z86010 Personal history of colonic polyps: Secondary | ICD-10-CM | POA: Insufficient documentation

## 2015-11-10 DIAGNOSIS — J449 Chronic obstructive pulmonary disease, unspecified: Secondary | ICD-10-CM | POA: Insufficient documentation

## 2015-11-10 DIAGNOSIS — F17210 Nicotine dependence, cigarettes, uncomplicated: Secondary | ICD-10-CM | POA: Diagnosis not present

## 2015-11-10 DIAGNOSIS — Z79899 Other long term (current) drug therapy: Secondary | ICD-10-CM | POA: Insufficient documentation

## 2015-11-10 DIAGNOSIS — Y658 Other specified misadventures during surgical and medical care: Secondary | ICD-10-CM | POA: Diagnosis not present

## 2015-11-10 DIAGNOSIS — S81811D Laceration without foreign body, right lower leg, subsequent encounter: Secondary | ICD-10-CM | POA: Diagnosis not present

## 2015-11-10 DIAGNOSIS — Z8719 Personal history of other diseases of the digestive system: Secondary | ICD-10-CM | POA: Diagnosis not present

## 2015-11-10 LAB — COMPREHENSIVE METABOLIC PANEL
ALK PHOS: 71 U/L (ref 38–126)
ALT: 9 U/L — ABNORMAL LOW (ref 14–54)
AST: 14 U/L — ABNORMAL LOW (ref 15–41)
Albumin: 3.4 g/dL — ABNORMAL LOW (ref 3.5–5.0)
Anion gap: 8 (ref 5–15)
BILIRUBIN TOTAL: 0.6 mg/dL (ref 0.3–1.2)
BUN: 20 mg/dL (ref 6–20)
CALCIUM: 9.3 mg/dL (ref 8.9–10.3)
CO2: 28 mmol/L (ref 22–32)
Chloride: 106 mmol/L (ref 101–111)
Creatinine, Ser: 0.67 mg/dL (ref 0.44–1.00)
GFR calc non Af Amer: >60 mL/min (ref >60)
Glucose, Bld: 103 mg/dL — ABNORMAL HIGH (ref 65–99)
POTASSIUM: 3.5 mmol/L (ref 3.5–5.1)
Sodium: 142 mmol/L (ref 135–145)
Total Protein: 6.4 g/dL — ABNORMAL LOW (ref 6.5–8.1)

## 2015-11-10 LAB — CBC
HCT: 39.6 % (ref 36.0–46.0)
Hemoglobin: 12.7 g/dL (ref 12.0–15.0)
MCH: 31.7 pg (ref 26.0–34.0)
MCHC: 32.1 g/dL (ref 30.0–36.0)
MCV: 98.8 fL (ref 78.0–100.0)
Platelets: 433 10*3/uL — ABNORMAL HIGH (ref 150–400)
RBC: 4.01 MIL/uL (ref 3.87–5.11)
RDW: 14.2 % (ref 11.5–15.5)
WBC: 11 10*3/uL — AB (ref 4.0–10.5)

## 2015-11-10 LAB — I-STAT CG4 LACTIC ACID, ED: LACTIC ACID, VENOUS: 0.93 mmol/L (ref 0.5–2.0)

## 2015-11-10 NOTE — ED Notes (Signed)
Pt reports laceration to lower R lateral leg approx 2 weeks ago from dishwasher door.  Now c/o wound being black with redness to leg and swelling to lower leg/foot.  Family just found out about injury today.  Pt ambulatory to triage.

## 2015-11-11 ENCOUNTER — Emergency Department (HOSPITAL_COMMUNITY)
Admission: EM | Admit: 2015-11-11 | Discharge: 2015-11-11 | Disposition: A | Discharge status: Home / Self Care | Payer: Medicare Other | Attending: Emergency Medicine | Admitting: Emergency Medicine

## 2015-11-11 DIAGNOSIS — T8189XA Other complications of procedures, not elsewhere classified, initial encounter: Secondary | ICD-10-CM | POA: Diagnosis not present

## 2015-11-11 DIAGNOSIS — T148XXD Other injury of unspecified body region, subsequent encounter: Secondary | ICD-10-CM

## 2015-11-11 MED ORDER — FUROSEMIDE 20 MG PO TABS
20.0000 mg | ORAL_TABLET | Freq: Every day | ORAL | Status: DC
Start: 2015-11-11 — End: 2016-10-05

## 2015-11-11 MED ORDER — CEPHALEXIN 250 MG PO CAPS
500.0000 mg | ORAL_CAPSULE | Freq: Once | ORAL | Status: AC
  Administered 2015-11-11: 500 mg via ORAL
  Filled 2015-11-11: qty 2

## 2015-11-11 MED ORDER — CEPHALEXIN 500 MG PO CAPS: 500.0000 mg | ORAL_CAPSULE | Freq: Two times a day (BID) | ORAL | Status: DC

## 2015-11-11 NOTE — ED Provider Notes (Signed)
CSN: [id number]     Arrival date & time 11/10/15  2119 History  By signing my name below, I, Altamease Oiler, attest that this documentation has been prepared under the direction and in the presence of Everlene Balls, MD. Electronically Signed: Altamease Oiler, ED Scribe. 11/11/2015. 1:01 AM  Chief Complaint  Patient presents with  . Wound Infection    The history is provided by the patient. No language interpreter was used.   Valerie Gay is a 74 y.o. female who presents to the Emergency Department complaining of a laceration at the lower right leg with onset 2 weeks ago. Pt states that she cut her leg on the dishwasher but was not seen by her doctor at that time. Initially she had a bleeding square shaped wound that she kept covered with gauze. She notes that there has been some clear discharge from the wound but no continued pain. Today there is redness and black discoloration at the wound with diffuse swelling. Pt denies fever, sweats, chills, and chest pain. She has no history of DM or DVT/PE. She has no PCP.   Past Medical History  Diagnosis Date  . Diverticulosis   . Colon polyps     2010   . COPD (chronic obstructive pulmonary disease) [facility])    Past Surgical History  Procedure Laterality Date  . Tonsillectomy    . Cataract extraction Right     August 15, 2013  . Cholecystectomy N/A 06/22/2014    Procedure: LAPAROSCOPIC CHOLECYSTECTOMY WITH INTRAOPERATIVE CHOLANGIOGRAM;  Surgeon: Gayland Curry, MD;  Location: [place];  Service: General;  Laterality: N/A;   No family history on file. Social History  Substance Use Topics  . Smoking status: Current Every Day Smoker -- 0.50 packs/day for 60 years    Types: Cigarettes  . Smokeless tobacco: Never Used     Comment: 3/4 pack per day 09/27/15  . Alcohol Use: No   OB History    No data available     Review of Systems 10 Systems reviewed and all are negative for acute change except as noted in the HPI.  Allergies  Spiriva  handihaler  Home Medications   Prior to Admission medications   Medication Sig Start Date End Date Taking? Authorizing Provider  albuterol (PROVENTIL HFA;VENTOLIN HFA) 108 (90 BASE) MCG/ACT inhaler Inhale 2 puffs into the lungs every 6 (six) hours as needed for wheezing or shortness of breath. 10/01/15  Yes Brand Males, MD  albuterol (PROVENTIL) (2.5 MG/3ML) 0.083% nebulizer solution INHALE CONTENTS OF 1 VIAL IN NEBULIZER EVERY 8 HOURS AS NEEDED FOR WHEEZING OR SHORTNESS OF BREATH 08/12/15  Yes Brand Males, MD  SYMBICORT 160-4.5 MCG/ACT inhaler USE 2 INHALATIONS TWICE A DAY 08/19/15  Yes Brand Males, MD  Umeclidinium Bromide (INCRUSE ELLIPTA) 62.5 MCG/INH AEPB Inhale 1 puff into the lungs daily. 02/26/15  Yes Tammy S Parrett, NP   BP 153/60 mmHg  Pulse 115  Temp(Src) 97.8 F (36.6 C) (Oral)  Resp 14  SpO2 92% Physical Exam  Constitutional: She is oriented to person, place, and time. She appears well-developed and well-nourished. No distress.  HENT:  Head: Normocephalic and atraumatic.  Nose: Nose normal.  Mouth/Throat: Oropharynx is clear and moist. No oropharyngeal exudate.  Eyes: Conjunctivae and EOM are normal. Pupils are equal, round, and reactive to light. No scleral icterus.  Neck: Normal range of motion. Neck supple. No JVD present. No tracheal deviation present. No thyromegaly present.  Cardiovascular: Normal rate, regular rhythm and normal heart sounds.  Exam reveals no gallop and no friction rub.   No murmur heard. Pulmonary/Chest: Effort normal and breath sounds normal. No respiratory distress. She has no wheezes. She exhibits no tenderness.  Abdominal: Soft. Bowel sounds are normal. She exhibits no distension and no mass. There is no tenderness. There is no rebound and no guarding.  Musculoskeletal: Normal range of motion. She exhibits edema. She exhibits no tenderness.  BLE edema worse on the right. Wound is 2 cm circumferential. Appears to be healing well. No  drainage, No warmth. No TTP.   Lymphadenopathy:    She has no cervical adenopathy.  Neurological: She is alert and oriented to person, place, and time. No cranial nerve deficit. She exhibits normal muscle tone.  Skin: Skin is warm and dry. No rash noted. No erythema. No pallor.  Nursing note and vitals reviewed.   ED Course  Procedures (including critical care time)  COORDINATION OF CARE: 12:55 AM Discussed treatment plan which includes lab work, abx, and Lasix with pt at bedside and pt agreed to plan.  Labs Review Labs Reviewed  COMPREHENSIVE METABOLIC PANEL - Abnormal; Notable for the following:    Glucose, Bld 103 (*)    Total Protein 6.4 (*)    Albumin 3.4 (*)    AST 14 (*)    ALT 9 (*)    All other components within normal limits  CBC - Abnormal; Notable for the following:    WBC 11.0 (*)    Platelets 433 (*)    All other components within normal limits  I-STAT CG4 LACTIC ACID, ED    Imaging Review No results found. I have personally reviewed and evaluated these lab results as part of my medical decision-making.   EKG Interpretation None      MDM   Final diagnoses:  Wound healing, delayed    Patient presents to the ED for assessment of her wound.  It actually appears to be healing quite well and does not appear consistent with abscess or cellulitis.  Patient and family however is requesting abx. So I will provide a short 3 day course and advise her to fu for further care. Also given lasix for lower extremity swelling for 3 days.  She appears well and in NAD.  VS remain within her normal limits and she is safe for DC.  I personally performed the services described in this documentation, which was scribed in my presence. The recorded information has been reviewed and is accurate.      Everlene Balls, MD 11/11/15 [phone]

## 2015-11-11 NOTE — Discharge Instructions (Signed)
Wound Care Ms. Annas, take antibiotics as directed and see a primary care doctor within 3 days for close follow up.  You can also take lasix to help with the swelling.  Come back to the ED in 3 days for wound check if you can not get an appointment.  For any concerning symptoms, come back to the ED immediately.  Thank you. Taking care of your wound properly can help to prevent pain and infection. It can also help your wound to heal more quickly.  HOW TO CARE FOR YOUR WOUND  Take or apply over-the-counter and prescription medicines only as told by your health care provider.  If you were prescribed antibiotic medicine, take or apply it as told by your health care provider. Do not stop using the antibiotic even if your condition improves.  Clean the wound each day or as told by your health care provider.  Wash the wound with mild soap and water.  Rinse the wound with water to remove all soap.  Pat the wound dry with a clean towel. Do not rub it.  There are many different ways to close and cover a wound. For example, a wound can be covered with stitches (sutures), skin glue, or adhesive strips. Follow instructions from your health care provider about:  How to take care of your wound.  When and how you should change your bandage (dressing).  When you should remove your dressing.  Removing whatever was used to close your wound.  Check your wound every day for signs of infection. Watch for:  Redness, swelling, or pain.  Fluid, blood, or pus.  Keep the dressing dry until your health care provider says it can be removed. Do not take baths, swim, use a hot tub, or do anything that would put your wound underwater until your health care provider approves.  Raise (elevate) the injured area above the level of your heart while you are sitting or lying down.  Do not scratch or pick at the wound.  Keep all follow-up visits as told by your health care provider. This is important. SEEK  MEDICAL CARE IF:  You received a tetanus shot and you have swelling, severe pain, redness, or bleeding at the injection site.  You have a fever.  Your pain is not controlled with medicine.  You have increased redness, swelling, or pain at the site of your wound.  You have fluid, blood, or pus coming from your wound.  You notice a bad smell coming from your wound or your dressing. SEEK IMMEDIATE MEDICAL CARE IF:  You have a red streak going away from your wound.   This information is not intended to replace advice given to you by your health care provider. Make sure you discuss any questions you have with your health care provider.   Document Released: 08/11/2008 Document Revised: 03/19/2015 Document Reviewed: 10/29/2014 Elsevier Interactive Patient Education [employer].

## 2015-11-14 DIAGNOSIS — H353122 Nonexudative age-related macular degeneration, left eye, intermediate dry stage: Secondary | ICD-10-CM | POA: Diagnosis not present

## 2015-11-14 DIAGNOSIS — H353212 Exudative age-related macular degeneration, right eye, with inactive choroidal neovascularization: Secondary | ICD-10-CM | POA: Diagnosis not present

## 2015-12-06 ENCOUNTER — Other Ambulatory Visit: Payer: Self-pay | Admitting: Internal Medicine

## 2015-12-25 DIAGNOSIS — J449 Chronic obstructive pulmonary disease, unspecified: Secondary | ICD-10-CM | POA: Diagnosis not present

## 2015-12-25 DIAGNOSIS — E7800 Pure hypercholesterolemia, unspecified: Secondary | ICD-10-CM | POA: Diagnosis not present

## 2015-12-25 DIAGNOSIS — Z720 Tobacco use: Secondary | ICD-10-CM | POA: Diagnosis not present

## 2015-12-25 DIAGNOSIS — Z0000 Encounter for general adult medical examination without abnormal findings: Secondary | ICD-10-CM | POA: Diagnosis not present

## 2016-01-05 ENCOUNTER — Other Ambulatory Visit: Payer: Self-pay | Admitting: Internal Medicine

## 2016-01-06 ENCOUNTER — Telehealth: Payer: Self-pay | Admitting: Internal Medicine

## 2016-01-06 NOTE — Telephone Encounter (Signed)
Pt aware that her albuterol neb med was sent to [facility] this morning. Nothing further needed.

## 2016-01-20 DIAGNOSIS — Z780 Asymptomatic menopausal state: Secondary | ICD-10-CM | POA: Diagnosis not present

## 2016-01-20 DIAGNOSIS — M810 Age-related osteoporosis without current pathological fracture: Secondary | ICD-10-CM | POA: Diagnosis not present

## 2016-01-28 ENCOUNTER — Other Ambulatory Visit: Payer: Self-pay | Admitting: Internal Medicine

## 2016-03-05 DIAGNOSIS — H353212 Exudative age-related macular degeneration, right eye, with inactive choroidal neovascularization: Secondary | ICD-10-CM | POA: Diagnosis not present

## 2016-09-02 DIAGNOSIS — H353122 Nonexudative age-related macular degeneration, left eye, intermediate dry stage: Secondary | ICD-10-CM | POA: Diagnosis not present

## 2016-09-02 DIAGNOSIS — H353211 Exudative age-related macular degeneration, right eye, with active choroidal neovascularization: Secondary | ICD-10-CM | POA: Diagnosis not present

## 2016-09-06 ENCOUNTER — Other Ambulatory Visit: Payer: Self-pay | Admitting: Internal Medicine

## 2016-09-08 DIAGNOSIS — H353211 Exudative age-related macular degeneration, right eye, with active choroidal neovascularization: Secondary | ICD-10-CM | POA: Diagnosis not present

## 2016-09-21 ENCOUNTER — Ambulatory Visit (INDEPENDENT_AMBULATORY_CARE_PROVIDER_SITE_OTHER)
Admission: RE | Admit: 2016-09-21 | Discharge: 2016-09-21 | Disposition: A | Discharge status: Home / Self Care | Payer: Medicare Other | Source: Ambulatory Visit | Attending: Internal Medicine | Admitting: Internal Medicine

## 2016-09-21 DIAGNOSIS — R911 Solitary pulmonary nodule: Secondary | ICD-10-CM | POA: Diagnosis not present

## 2016-10-05 ENCOUNTER — Encounter: Payer: Self-pay | Admitting: Internal Medicine

## 2016-10-05 ENCOUNTER — Ambulatory Visit (INDEPENDENT_AMBULATORY_CARE_PROVIDER_SITE_OTHER): Payer: Medicare Other | Admitting: Internal Medicine

## 2016-10-05 VITALS — BP 140/72 | HR 79 | Ht 64.0 in | Wt 161.0 lb

## 2016-10-05 DIAGNOSIS — F17200 Nicotine dependence, unspecified, uncomplicated: Secondary | ICD-10-CM

## 2016-10-05 DIAGNOSIS — J441 Chronic obstructive pulmonary disease with (acute) exacerbation: Secondary | ICD-10-CM | POA: Diagnosis not present

## 2016-10-05 DIAGNOSIS — R911 Solitary pulmonary nodule: Secondary | ICD-10-CM

## 2016-10-05 DIAGNOSIS — I2584 Coronary atherosclerosis due to calcified coronary lesion: Secondary | ICD-10-CM | POA: Diagnosis not present

## 2016-10-05 DIAGNOSIS — J449 Chronic obstructive pulmonary disease, unspecified: Secondary | ICD-10-CM

## 2016-10-05 DIAGNOSIS — I2510 Atherosclerotic heart disease of native coronary artery without angina pectoris: Secondary | ICD-10-CM | POA: Diagnosis not present

## 2016-10-05 MED ORDER — PREDNISONE 10 MG PO TABS: ORAL_TABLET | ORAL | 0 refills | Status: DC

## 2016-10-05 MED ORDER — DOXYCYCLINE HYCLATE 100 MG PO TABS: ORAL_TABLET | ORAL | 0 refills | Status: DC

## 2016-10-05 NOTE — Patient Instructions (Addendum)
COPD exacerbation (HCC) COPD, moderate ([place])  - Take doxycycline 100mg  po twice daily x 5 days; take after meals and avoid sunlight - Please take prednisone 40 mg x1 day, then 30 mg x1 day, then 20 mg x1 day, then 10 mg x1 day, and then 5 mg x1 day and stop - continue incruse and symbicort as before  - get high dose flu shot next week once you are well  Nodule of right lung   - stable nov 2017 wihtout need for further CT   Tobacco use disorder - quit smoking  Coronary artery calcification  -  please refer to cardiologist - [facility] or Dr Einar Gip, first available  Followup  6 months or sooner if needed

## 2016-10-05 NOTE — Progress Notes (Addendum)
Subjective:     Patient ID: Valerie Gay, female   DOB: [birth year], 75 y.o.   MRN: [id number]  HPI  OV 09/27/2015  Chief Complaint  Patient presents with  . Follow-up    Pt states her breathing is unchanged since last OV. Pt states she has intermittent chest tightness when SOB. Pt states her SOB is at baseline.  Pt denies cough.    #COPD - moderate - gold stage 2; borderline gold stg 3 #Smoking # RUL lung nodule - 92mm  June 2015     OV 11/28/2014  Chief Complaint  Patient presents with  . Follow-up    Pt c/o stable sob with exertion.  CAT score 17.  #COPD - moderate - gold stage 2; borderline gold stg 3  - Do to supposedly allergy from Spiriva she is now only on Symbicort. Overall COPD stable. COPD cat score is 17. Pulmonary rehabilitation initiation phase has helped her. She is going to start the maintenance phase. She is up-to-date with her vaccines. She thinks one more inhaler can help her. Not interested in research studies.   #Smoking: Continues to smoke refuses to quit   # RUL lung nodule - 61mm  June 2015 . Follow-up CT scan of the chest and January 2016 is stable.    02/26/2015 Follow up /O2 qualification  Patient returns for a two-month follow-up for COPD. She is maintained on Symbicort and INCRUSE.  She is here today for oxygen qualification. Patient does desaturate with walking to 88% on room air. She is on 3 L with activity and at bedtime.. She says overall her breathing is stable without flare of cough or shortness of breath. She denies any hemoptysis, chest pain, orthopnea, PND or leg swelling She has a known right upper lobe lung nodule 5 mm which was stable on follow-up CT chest. She has a follow-up CT chest in September 2016. She does continue to smoke, smoking cessation was discussed.  OV 09/27/2015  Chief Complaint  Patient presents with  . Follow-up    Pt states her breathing is unchanged since last OV. Pt states she has intermittent chest  tightness when SOB. Pt states her SOB is at baseline.  Pt denies cough.    COPD: stable. No issues. Doing well on triple MDI. NEeds flu shot   SMoking:  reports that she has been smoking Cigarettes.  She has a 30 pack-year smoking history. She has never used smokeless tobacco.   Lun gnodule  - personally visualizeed  IMPRESSION: 1. 5 mm right upper lobe nodule is unchanged from 05/15/2014 and is therefore likely benign. Additionally, 18-24 months of documented stability is recommended. Additional follow-up could be performed in 12 months to ensure stability, as clinically indicated. This recommendation follows the consensus statement: Guidelines for Management of Small Pulmonary Nodules Detected on CT Scans: A Statement from the [place] as published in Radiology 2005; 237:395-400. Online at: https://www.arnold.com/. 2. Moderate to severe centrilobular emphysema. 3. Three-vessel coronary artery calcification. 4. Tiny bilateral renal stones.   Electronically Signed  By: Lorin Picket M.D.  On: 09/18/2015 12:31    OV 10/05/2016  Chief Complaint  Patient presents with  . Follow-up    Pt here for yearly f/u. Pt c/o increase in SOB x 2-3 days. Also c/o prod coughand chest tightness x 2-3 days. Pt denies f/c/s.    75 year old female with moderate COPD. Presents for yearly follow-up.  COPD: Overall stable but the last 2 days she's had increased cough, chest congestion,  chest tightness, wheezing compared to baseline. In addition she is at increased sputum volume. She does not know the sputum color. This no hemoptysis or fever or chills. Does denies any sick contacts. She's not had had a flu shot and was hoping to get a flu shot today. She's completed rehabilitation.  Right upper lobe lung nodule:  November 2017 CT chest shows it stable since 2015.  Smoking: She continues to smoke. Finds it difficult to quit.  Incidental findings on  CT chest: She does have coronary artery calcification. She had last year too. Denies any chest pain. She does not recollect any cardiac stress test. Review of the chart quickly does not show any cardiology evaluation. However she says she was subjected to some test where she cannot lie supine and therefore it was canceled. Do not know the details. She also has mild renal stones but she is aware of this. They're asymptomatic.    IMPRESSION:ctg chest 09/21/16   1. Right upper lobe nodule is unchanged from 05/15/2014 and is therefore considered benign. 2.  Emphysema (ICD10-J43.9). 3. Aortic atherosclerosis (ICD10-170.0). Coronary artery calcification. 4. Right adrenal adenoma. 5. Bilateral renal stones.   Electronically Signed   By: Lorin Picket M.D.   On: 09/21/2016 11:17   CAT COPD Symptom & Quality of Life Score ([place]) 0 is no burden. 5 is highest burden 10/05/2016   Never Cough -> Cough all the time 3  No phlegm in chest -> Chest is full of phlegm 3  No chest tightness -> Chest feels very tight 3  No dyspnea for 1 flight stairs/hill -> Very dyspneic for 1 flight of stairs 4  No limitations for ADL at home -> Very limited with ADL at home 3  Confident leaving home -> Not at all confident leaving home 3  Sleep soundly -> Do not sleep soundly because of lung condition 3  Lots of Energy -> No energy at all 3  TOTAL Score (max 40)  25       has a past medical history of Colon polyps; COPD (chronic obstructive pulmonary disease) ([place]); and Diverticulosis.   reports that she has been smoking Cigarettes.  She has a 30.00 pack-year smoking history. She has never used smokeless tobacco.  Past Surgical History:  Procedure Laterality Date  . CATARACT EXTRACTION Right    August 15, 2013  . CHOLECYSTECTOMY N/A 06/22/2014   Procedure: LAPAROSCOPIC CHOLECYSTECTOMY WITH INTRAOPERATIVE CHOLANGIOGRAM;  Surgeon: Gayland Curry, MD;  Location: [place];  Service: General;   Laterality: N/A;  . TONSILLECTOMY      Allergies  Allergen Reactions  . Spiriva Handihaler [Tiotropium Bromide Monohydrate] Other (See Comments)    Itching, rash, hives on upper body    Immunization History  Administered Date(s) Administered  . H1N1 11/05/2008  . Influenza Whole 07/29/2014  . Influenza,inj,Quad PF,36+ Mos 09/27/2015  . Pneumococcal Polysaccharide-23 03/28/2014    No family history on file.   Current Outpatient Prescriptions:  .  albuterol (PROVENTIL HFA;VENTOLIN HFA) 108 (90 BASE) MCG/ACT inhaler, Inhale 2 puffs into the lungs every 6 (six) hours as needed for wheezing or shortness of breath., Disp: 1 Inhaler, Rfl: 11 .  albuterol (PROVENTIL) (2.5 MG/3ML) 0.083% nebulizer solution, inhale contents of 1 vial in nebulizer every 8 hours if needed for wheezing or shortness of breath, Disp: 300 vial, Rfl: 2 .  INCRUSE ELLIPTA 62.5 MCG/INH AEPB, USE 1 INHALATION DAILY, Disp: 90 each, Rfl: 2 .  SYMBICORT 160-4.5 MCG/ACT inhaler, USE 2 INHALATIONS  TWICE A DAY, Disp: 30.6 g, Rfl: 6   Review of Systems     Objective:   Physical Exam  Constitutional: She is oriented to person, place, and time. She appears well-developed and well-nourished. No distress.  HENT:  Head: Normocephalic and atraumatic.  Right Ear: External ear normal.  Left Ear: External ear normal.  Mouth/Throat: Oropharynx is clear and moist. No oropharyngeal exudate.  Eyes: Conjunctivae and EOM are normal. Pupils are equal, round, and reactive to light. Right eye exhibits no discharge. Left eye exhibits no discharge. No scleral icterus.  Neck: Normal range of motion. Neck supple. No JVD present. No tracheal deviation present. No thyromegaly present.  Cardiovascular: Normal rate, regular rhythm, normal heart sounds and intact distal pulses.  Exam reveals no gallop and no friction rub.   No murmur heard. Pulmonary/Chest: Effort normal and breath sounds normal. No respiratory distress. She has no wheezes.  She has no rales. She exhibits no tenderness.  Abdominal: Soft. Bowel sounds are normal. She exhibits no distension and no mass. There is no tenderness. There is no rebound and no guarding.  Musculoskeletal: Normal range of motion. She exhibits no edema or tenderness.  Lymphadenopathy:    She has no cervical adenopathy.  Neurological: She is alert and oriented to person, place, and time. She has normal reflexes. No cranial nerve deficit. She exhibits normal muscle tone. Coordination normal.  Skin: Skin is warm and dry. No rash noted. She is not diaphoretic. No erythema. No pallor.  Psychiatric: She has a normal mood and affect. Her behavior is normal. Judgment and thought content normal.  Vitals reviewed.    Vitals:   10/05/16 1131  BP: 140/72  Pulse: 79  SpO2: 96%  Weight: 161 lb (73 kg)  Height: 5\' 4"  (1.626 m)    Estimated body mass index is 27.64 kg/m as calculated from the following:   Height as of this encounter: 5\' 4"  (1.626 m).   Weight as of this encounter: 161 lb (73 kg).      Assessment:       ICD-9-CM ICD-10-CM   1. COPD exacerbation ([place]) 491.21 J44.1   2. COPD, moderate ([place]) 496 J44.9   3. Nodule of right lung 793.11 R91.1   4. Tobacco use disorder 305.1 F17.200        Plan:     COPD exacerbation (HCC) COPD, moderate (HCC)  - Take doxycycline 100mg  po twice daily x 5 days; take after meals and avoid sunlight - Please take prednisone 40 mg x1 day, then 30 mg x1 day, then 20 mg x1 day, then 10 mg x1 day, and then 5 mg x1 day and stop - continue incruse and symbicort as before  - get high dose flu shot next week once you are well  Nodule of right lung   - stable nov 2017 wihtout need for further CT   Tobacco use disorder - quit smoking  Coronary artery calcification  -  please refer to cardiologist - [facility] or Dr Einar Gip, first available  Followup  6 months or sooner if needed  Dr. Brand Males, M.D., [facility].C.P Pulmonary and Critical Care  Medicine Staff Physician [facility] Pager: [phone], If no answer or between  15:00h - 7:00h: call [phone]  10/05/2016 11:48 AM

## 2016-10-06 DIAGNOSIS — H353211 Exudative age-related macular degeneration, right eye, with active choroidal neovascularization: Secondary | ICD-10-CM | POA: Diagnosis not present

## 2016-10-16 HISTORY — PX: NM MYOVIEW LTD: HXRAD82

## 2016-10-21 ENCOUNTER — Encounter: Payer: Self-pay | Admitting: Cardiology

## 2016-10-21 ENCOUNTER — Ambulatory Visit (INDEPENDENT_AMBULATORY_CARE_PROVIDER_SITE_OTHER): Payer: Medicare Other | Admitting: Cardiology

## 2016-10-21 VITALS — BP 134/78 | HR 107 | Ht 62.5 in | Wt 157.6 lb

## 2016-10-21 DIAGNOSIS — I2584 Coronary atherosclerosis due to calcified coronary lesion: Secondary | ICD-10-CM

## 2016-10-21 DIAGNOSIS — E785 Hyperlipidemia, unspecified: Secondary | ICD-10-CM | POA: Diagnosis not present

## 2016-10-21 DIAGNOSIS — I2510 Atherosclerotic heart disease of native coronary artery without angina pectoris: Secondary | ICD-10-CM | POA: Diagnosis not present

## 2016-10-21 DIAGNOSIS — Z79899 Other long term (current) drug therapy: Secondary | ICD-10-CM

## 2016-10-21 DIAGNOSIS — R079 Chest pain, unspecified: Secondary | ICD-10-CM | POA: Diagnosis not present

## 2016-10-21 DIAGNOSIS — J449 Chronic obstructive pulmonary disease, unspecified: Secondary | ICD-10-CM

## 2016-10-21 DIAGNOSIS — R0600 Dyspnea, unspecified: Secondary | ICD-10-CM

## 2016-10-21 DIAGNOSIS — R0609 Other forms of dyspnea: Secondary | ICD-10-CM | POA: Diagnosis not present

## 2016-10-21 NOTE — Progress Notes (Signed)
PCP: No PCP Per Patient  Clinic Note: Chief Complaint  Patient presents with  . New Evaluation    pt c/o heart racing, DOE, occasional CP  . Coronary Artery Disease    CAD noted on CT Scan    HPI: Valerie Gay is a 75 y.o. female with a PMH below who presents today for Cardiology Consultation for Coronary Calcification noted on CT.  She also notes occasional rapid HR spells. She is a chronic smoker who has moderate COPD-Gold stage II, borderline goal stage III. She also has a right upper lobe lung nodule. Despite having her COPD, persistently refuses to quit smoking.  Valerie Gay was Seen by Dr. Chase Caller from pulmonary medicine for follow-up of her COPD. During that visit she noted intermittent chest tightness associated dyspnea. She indicated that her dyspnea was at baseline. As part of her routine evaluation she had her chest CT scan done that again showed coronary artery calcification.  Recent Hospitalizations: None  Studies Reviewed:   Chest CT 09/21/2016: Atherosclerotic calcification of the arterial vasculature including coronary arteries. Otherwise normal heart size and no effusion. Centrilobular emphysema with a 6 mm posterior right upper lobe nodule (unchanged). Right adrenal adenoma and bilateral renal stones  Interval History: Valerie Gay presents today really saying that she has her chronic exertional dyspnea from her COPD, but does occasionally note if she pushes it beyond a certain level that she may have some tightness across her chest. She has always attributed this to her COPD, but now has a little bit more concern after hearing the results of her CT scan. Resting chest discomfort except when coughing. No PND, orthopnea or edema.  She says she has occasional racing heart spells but really not a lot of palpitations and heart spells are relatively rare and are not associated with any lightheadedness, dizziness, wooziness. No syncope or near-syncope, no TIA or  amaurosis fugax symptoms. She also does describe some episodes of pill dysphagia where she has pills get stuck in her throat. She is not very excited about the thought of having to potentially take any other medications.  She denies any claudication type pain in her legs, but does note resting cramps.  ROS: A comprehensive was performed. Review of Systems  Constitutional: Negative for chills, fever, malaise/fatigue and weight loss.  HENT: Negative for congestion and nosebleeds.   Respiratory: Positive for cough, shortness of breath and wheezing. Negative for sputum production.   Cardiovascular: Positive for chest pain and palpitations (Per history of present illness).       Noted in history of present illness  Gastrointestinal: Negative for blood in stool, constipation, heartburn and melena.  Genitourinary: Negative for dysuria and hematuria.  Musculoskeletal: Positive for joint pain (Normal osteoarthritis pains) and myalgias ([place]).  Neurological: Negative for dizziness and weakness.  Endo/Heme/Allergies: Negative for environmental allergies. Does not bruise/bleed easily.  Psychiatric/Behavioral: Negative for depression and memory loss. The patient is not nervous/anxious and does not have insomnia.     Past Medical History:  Diagnosis Date  . Colon polyps    2010   . COPD (chronic obstructive pulmonary disease) ([place])   . Diverticulosis   . Hyperlipidemia     Past Surgical History:  Procedure Laterality Date  . CATARACT EXTRACTION Right    August 15, 2013  . CHOLECYSTECTOMY N/A 06/22/2014   Procedure: LAPAROSCOPIC CHOLECYSTECTOMY WITH INTRAOPERATIVE CHOLANGIOGRAM;  Surgeon: Gayland Curry, MD;  Location: [facility];  Service: General;  Laterality: N/A;  . TONSILLECTOMY  Current Meds  Medication Sig  . albuterol (PROVENTIL HFA;VENTOLIN HFA) 108 (90 BASE) MCG/ACT inhaler Inhale 2 puffs into the lungs every 6 (six) hours as needed for wheezing or shortness of breath.  [name]  albuterol (PROVENTIL) (2.5 MG/3ML) 0.083% nebulizer solution inhale contents of 1 vial in nebulizer every 8 hours if needed for wheezing or shortness of breath  . INCRUSE ELLIPTA 62.5 MCG/INH AEPB USE 1 INHALATION DAILY  . SYMBICORT 160-4.5 MCG/ACT inhaler USE 2 INHALATIONS TWICE A DAY  . [DISCONTINUED] predniSONE (DELTASONE) 10 MG tablet 40 mg x1 day, then 30 mg x1 day, then 20 mg x1 day, then 10 mg x1 day, and then 5 mg x1 day and stop    Allergies  Allergen Reactions  . Spiriva Handihaler [Tiotropium Bromide Monohydrate] Other (See Comments)    Itching, rash, hives on upper body    Social History   Social History  . Marital status: Divorced    Spouse name: N/A  . Number of children: N/A  . Years of education: N/A   Occupational History  . retired    Social History Main Topics  . Smoking status: Current Every Day Smoker    Packs/day: 0.50    Years: 60.00    Types: Cigarettes  . Smokeless tobacco: Never Used     Comment: 1/2 ppd 11.20.17  . Alcohol use No  . Drug use: Unknown  . Sexual activity: Not Asked   Other Topics Concern  . None   Social History Narrative  . None    family history includes Alzheimer's disease in her mother; Cancer in her father; Diabetes in her mother.  Wt Readings from Last 3 Encounters:  10/21/16 71.5 kg (157 lb 9.6 oz)  10/05/16 73 kg (161 lb)  09/27/15 70.4 kg (155 lb 3.2 oz)    PHYSICAL EXAM BP 134/78 (BP Location: Left Arm, Cuff Size: Small)   Pulse (!) 107   Ht 5' 2.5" (1.588 m)   Wt 71.5 kg (157 lb 9.6 oz)   SpO2 95%   BMI 28.37 kg/m  General appearance: Pleasant elderly woman who appears her stated age. She is in no acute distress. Well-nourished and well-groomed. HEENT: [place]/AT, EOMI, MMM, anicteric sclera Neck: no adenopathy, no carotid bruit and no JVD Lungs: Somewhat reduced breath sounds throughout. But otherwise CTA B. Mild interstitial lung sounds, but no obvious rales or rhonchi. Mild expiratory wheezes noted with  deep brisk expiration. Normal percussion bilaterally and non-labored Heart: regular rate and rhythm, S1, S2 normal, very soft 1/6 SEM . No click, rub or gallop Abdomen: soft, non-tender; bowel sounds normal; no masses,  no organomegaly; no HJR Extremities: extremities normal, atraumatic, no cyanosis, or edema  Pulses: 2+ and symmetric;  Skin: mobility and turgor normal, no edema, no evidence of bleeding or bruising and no lesions noted  Neurologic: Mental status: Alert, oriented, thought content appropriate Cranial nerves: normal (II-XII grossly intact)    Adult ECG Report  Rate: 107 ;  Rhythm: sinus tachycardia, premature ventricular contractions (PVC) and Otherwise normal axis, intervals and durations.;   Narrative Interpretation: No recent EKG to compare to.   Other studies Reviewed: Additional studies/ records that were reviewed today include:  Recent Labs:  Last noted LDL ~149 (available on Care Everywhere)   ASSESSMENT / PLAN: Problem List Items Addressed This Visit    Coronary artery calcification - Primary (Chronic)    Long-term smoker with dyslipidemia and borderline blood pressures now presenting with CT scan showing coronary calcification. She is  has noted some exertional chest discomfort along with dyspnea.  Plan: Physiologic evaluation with Lexiscan Myoview      Relevant Orders   EKG 12-Lead   Myocardial Perfusion Imaging   DOE (dyspnea on exertion) (Chronic)    She says her exertional dyspnea is baseline, but now is associated with some tightness in her chest. Continues to smoke. In reluctant to quit. Did not really even hear conversation of quitting.  Not on many medications other than albuterol and Symbicort.  Plan: Ischemic evaluation with Lexiscan Myoview.      Relevant Orders   EKG 12-Lead   Myocardial Perfusion Imaging   Chest pain with moderate risk for cardiac etiology    She indicated both to me and Dr. Chase Caller that she has been having some  exertional chest tightness. This may be related to COPD, however with corneal calcium complication noted on CT scan now for several years suggesting 3 vessel disease, an ischemic evaluation is warranted.  Plan: Lexiscan Myoview      COPD, moderate ([place])   Hyperlipidemia LDL goal <70    In the extent of coronary calcification noted on her CT scan, she does have three-vessel disease, and with other risk factors would warrant more aggressive treatment of her lipids. She is not on a statin, but apparently last check her LDL was not high 140s  I would like to check a lipid panel and LFTs in order to get a new baseline on her. The plan of thickened and for her to adjust her lifestyle, she is not symptomatically done that. Would have low threshold for initiating statin therapy.      Relevant Orders   EKG 12-Lead   Myocardial Perfusion Imaging   Hepatic function panel   Lipid panel   Medication management   Relevant Orders   EKG 12-Lead   Myocardial Perfusion Imaging   Hepatic function panel   Lipid panel      Current medicines are reviewed at length with the patient today. (+/- concerns) n/a The following changes have been made: n/a  Patient Instructions  PLEASE DO LABS - DO NOT EAT OR DRINK THE MORNING  OF THE TEST LIPIDS, LIVER FUNCTION TEST.  SCHEDULE AT [place] Your physician has requested that you have a lexiscan myoview. For further information please visit [email or web address]. Please follow instruction sheet, as given.   Your physician recommends that you schedule a follow-up appointment in [place].     If you need a refill on your cardiac medications before your next appointment, please call your pharmacy.   Studies Ordered:   Orders Placed This Encounter  Procedures  . Hepatic function panel  . Lipid panel  . Myocardial Perfusion Imaging  . EKG 12-Lead      Glenetta Hew, M.D., M.S. Interventional Cardiologist   Pager  # [phone] Phone # [phone] [address]. [place], [place] [address]

## 2016-10-21 NOTE — Patient Instructions (Addendum)
PLEASE DO LABS - DO NOT EAT OR DRINK THE MORNING  OF THE TEST LIPIDS, LIVER FUNCTION TEST.  SCHEDULE AT [facility] Your physician has requested that you have a lexiscan myoview. For further information please visit [email or web address]. Please follow instruction sheet, as given.   Your physician recommends that you schedule a follow-up appointment in [place].     If you need a refill on your cardiac medications before your next appointment, please call your pharmacy.

## 2016-10-23 ENCOUNTER — Encounter: Payer: Self-pay | Admitting: Cardiology

## 2016-10-23 DIAGNOSIS — I2510 Atherosclerotic heart disease of native coronary artery without angina pectoris: Secondary | ICD-10-CM | POA: Insufficient documentation

## 2016-10-23 DIAGNOSIS — R0609 Other forms of dyspnea: Secondary | ICD-10-CM

## 2016-10-23 DIAGNOSIS — R0600 Dyspnea, unspecified: Secondary | ICD-10-CM | POA: Insufficient documentation

## 2016-10-23 DIAGNOSIS — Z79899 Other long term (current) drug therapy: Secondary | ICD-10-CM | POA: Insufficient documentation

## 2016-10-23 DIAGNOSIS — E785 Hyperlipidemia, unspecified: Secondary | ICD-10-CM | POA: Insufficient documentation

## 2016-10-23 DIAGNOSIS — R079 Chest pain, unspecified: Secondary | ICD-10-CM | POA: Insufficient documentation

## 2016-10-23 DIAGNOSIS — I2584 Coronary atherosclerosis due to calcified coronary lesion: Principal | ICD-10-CM

## 2016-10-23 NOTE — Assessment & Plan Note (Signed)
She says her exertional dyspnea is baseline, but now is associated with some tightness in her chest. Continues to smoke. In reluctant to quit. Did not really even hear conversation of quitting.  Not on many medications other than albuterol and Symbicort.  Plan: Ischemic evaluation with Lexiscan Myoview.

## 2016-10-23 NOTE — Assessment & Plan Note (Signed)
She indicated both to me and Dr. Chase Caller that she has been having some exertional chest tightness. This may be related to COPD, however with corneal calcium complication noted on CT scan now for several years suggesting 3 vessel disease, an ischemic evaluation is warranted.  Plan: [employer]

## 2016-10-23 NOTE — Assessment & Plan Note (Signed)
Long-term smoker with dyslipidemia and borderline blood pressures now presenting with CT scan showing coronary calcification. She is has noted some exertional chest discomfort along with dyspnea.  Plan: Physiologic evaluation with [facility]

## 2016-10-23 NOTE — Assessment & Plan Note (Signed)
In the extent of coronary calcification noted on her CT scan, she does have three-vessel disease, and with other risk factors would warrant more aggressive treatment of her lipids. She is not on a statin, but apparently last check her LDL was not high 140s  I would like to check a lipid panel and LFTs in order to get a new baseline on her. The plan of thickened and for her to adjust her lifestyle, she is not symptomatically done that. Would have low threshold for initiating statin therapy.

## 2016-10-26 ENCOUNTER — Other Ambulatory Visit: Payer: Self-pay | Admitting: Internal Medicine

## 2016-10-27 DIAGNOSIS — E785 Hyperlipidemia, unspecified: Secondary | ICD-10-CM | POA: Diagnosis not present

## 2016-10-27 DIAGNOSIS — Z79899 Other long term (current) drug therapy: Secondary | ICD-10-CM | POA: Diagnosis not present

## 2016-10-28 LAB — HEPATIC FUNCTION PANEL
ALT: 10 U/L (ref 6–29)
AST: 14 U/L (ref 10–35)
Albumin: 4 g/dL (ref 3.6–5.1)
Alkaline Phosphatase: 66 U/L (ref 33–130)
BILIRUBIN DIRECT: 0.1 mg/dL (ref <=0.2)
BILIRUBIN TOTAL: 0.5 mg/dL (ref 0.2–1.2)
Indirect Bilirubin: 0.4 mg/dL (ref 0.2–1.2)
Total Protein: 6.5 g/dL (ref 6.1–8.1)

## 2016-10-28 LAB — LIPID PANEL
CHOL/HDL RATIO: 4.4 ratio (ref <5.0)
Cholesterol: 177 mg/dL (ref <200)
HDL: 40 mg/dL — ABNORMAL LOW (ref >50)
LDL CALC: 119 mg/dL — AB (ref <100)
Triglycerides: 91 mg/dL (ref <150)
VLDL: 18 mg/dL (ref <30)

## 2016-10-29 ENCOUNTER — Telehealth (HOSPITAL_COMMUNITY): Payer: Self-pay

## 2016-10-29 NOTE — Telephone Encounter (Signed)
Encounter complete. 

## 2016-11-03 ENCOUNTER — Ambulatory Visit (HOSPITAL_COMMUNITY)
Admission: RE | Admit: 2016-11-03 | Discharge: 2016-11-03 | Disposition: A | Discharge status: Home / Self Care | Payer: Medicare Other | Source: Ambulatory Visit | Attending: Cardiology | Admitting: Cardiology

## 2016-11-03 DIAGNOSIS — J449 Chronic obstructive pulmonary disease, unspecified: Secondary | ICD-10-CM | POA: Insufficient documentation

## 2016-11-03 DIAGNOSIS — Z79899 Other long term (current) drug therapy: Secondary | ICD-10-CM | POA: Diagnosis not present

## 2016-11-03 DIAGNOSIS — R0600 Dyspnea, unspecified: Secondary | ICD-10-CM

## 2016-11-03 DIAGNOSIS — E785 Hyperlipidemia, unspecified: Secondary | ICD-10-CM | POA: Diagnosis not present

## 2016-11-03 DIAGNOSIS — I2584 Coronary atherosclerosis due to calcified coronary lesion: Secondary | ICD-10-CM

## 2016-11-03 DIAGNOSIS — I2510 Atherosclerotic heart disease of native coronary artery without angina pectoris: Secondary | ICD-10-CM | POA: Diagnosis not present

## 2016-11-03 DIAGNOSIS — R0609 Other forms of dyspnea: Secondary | ICD-10-CM | POA: Diagnosis not present

## 2016-11-03 LAB — MYOCARDIAL PERFUSION IMAGING
CHL CUP NUCLEAR SDS: 2
CHL CUP NUCLEAR SSS: 4
CHL CUP RESTING HR STRESS: 68 {beats}/min
LVDIAVOL: 61 mL (ref 46–106)
LVSYSVOL: 22 mL
NUC STRESS TID: 1.17
Peak HR: 115 {beats}/min
SRS: 2

## 2016-11-03 MED ORDER — REGADENOSON 0.4 MG/5ML IV SOLN
0.4000 mg | Freq: Once | INTRAVENOUS | Status: AC
  Administered 2016-11-03: 0.4 mg via INTRAVENOUS

## 2016-11-03 MED ORDER — TECHNETIUM TC 99M TETROFOSMIN IV KIT
10.2000 | PACK | Freq: Once | INTRAVENOUS | Status: AC | PRN
  Administered 2016-11-03: 10.2 via INTRAVENOUS
  Filled 2016-11-03: qty 11

## 2016-11-03 MED ORDER — AMINOPHYLLINE 25 MG/ML IV SOLN
75.0000 mg | Freq: Once | INTRAVENOUS | Status: AC
  Administered 2016-11-03: 75 mg via INTRAVENOUS

## 2016-11-03 MED ORDER — TECHNETIUM TC 99M TETROFOSMIN IV KIT
31.8000 | PACK | Freq: Once | INTRAVENOUS | Status: AC | PRN
  Administered 2016-11-03: 31.8 via INTRAVENOUS
  Filled 2016-11-03: qty 32

## 2016-11-03 NOTE — Progress Notes (Signed)
Stress Test looked good!! No sign of signific obstructive ant Heart Artery Disease - this means that the calcification noted on CT scan does not correlate with obstructive heart disease.  Pump function is normal - estimated ejection fraction is the upper limit of normal of 63% with normal being 55-65%.   Good news!!.  Leonie Man, MD

## 2016-11-06 ENCOUNTER — Telehealth: Payer: Self-pay | Admitting: *Deleted

## 2016-11-06 NOTE — Telephone Encounter (Signed)
Spoke to patient. LAB  AND MYOVIEW Result given . Verbalized understanding

## 2016-11-06 NOTE — Telephone Encounter (Signed)
-----   Message from Leonie Man, MD sent at 11/03/2016  6:14 PM EST ----- Stress Test looked good!! No sign of signific obstructive ant Heart Artery Disease - this means that the calcification noted on CT scan does not correlate with obstructive heart disease.  Pump function is normal - estimated ejection fraction is the upper limit of normal of 63% with normal being 55-65%.   Good news!!.  Leonie Man, MD

## 2016-11-06 NOTE — Telephone Encounter (Signed)
-----   Message from Leonie Man, MD sent at 11/03/2016  6:12 PM EST ----- Overall, cholesterol level itself does not look for wall. HDL is okay, triglycerides are okay, but LDL is still not at our target goal which would be at least less than 100 but targeting 70.  I think the original plan was dietary modification, but I think we may need to consider starting at least moderate dose statin or some other type medication.  Liver function tests overall look notably improved from last year. Nutrition status appears to have improved.   Glenetta Hew, MD

## 2016-11-26 DIAGNOSIS — H353211 Exudative age-related macular degeneration, right eye, with active choroidal neovascularization: Secondary | ICD-10-CM | POA: Diagnosis not present

## 2016-12-01 ENCOUNTER — Ambulatory Visit (INDEPENDENT_AMBULATORY_CARE_PROVIDER_SITE_OTHER): Payer: Medicare Other | Admitting: Cardiology

## 2016-12-01 ENCOUNTER — Encounter: Payer: Self-pay | Admitting: Cardiology

## 2016-12-01 VITALS — BP 120/61 | HR 95 | Ht 63.0 in | Wt 155.8 lb

## 2016-12-01 DIAGNOSIS — R0609 Other forms of dyspnea: Secondary | ICD-10-CM

## 2016-12-01 DIAGNOSIS — I2510 Atherosclerotic heart disease of native coronary artery without angina pectoris: Secondary | ICD-10-CM | POA: Diagnosis not present

## 2016-12-01 DIAGNOSIS — F17200 Nicotine dependence, unspecified, uncomplicated: Secondary | ICD-10-CM | POA: Diagnosis not present

## 2016-12-01 DIAGNOSIS — Z79899 Other long term (current) drug therapy: Secondary | ICD-10-CM

## 2016-12-01 DIAGNOSIS — E785 Hyperlipidemia, unspecified: Secondary | ICD-10-CM

## 2016-12-01 DIAGNOSIS — I2584 Coronary atherosclerosis due to calcified coronary lesion: Secondary | ICD-10-CM | POA: Diagnosis not present

## 2016-12-01 DIAGNOSIS — Z23 Encounter for immunization: Secondary | ICD-10-CM | POA: Diagnosis not present

## 2016-12-01 DIAGNOSIS — R0600 Dyspnea, unspecified: Secondary | ICD-10-CM

## 2016-12-01 MED ORDER — COENZYME Q10 100 MG PO CAPS
300.0000 mg | ORAL_CAPSULE | Freq: Every day | ORAL | Status: DC
Start: 2016-12-01 — End: 2017-02-03

## 2016-12-01 NOTE — Progress Notes (Signed)
PCP: No PCP Per Patient  Clinic Note: Chief Complaint  Patient presents with  . Follow-up    Coronary artery calcification on CT  . Shortness of Breath    due to COPD    HPI: Valerie Gay is a 76 y.o. female with a PMH below who presents today for Cardiology Consultation for Coronary Calcification noted on CT.  She also notes occasional rapid HR spells. She is a chronic smoker who has moderate COPD-Gold stage II, borderline goal stage III. She also has a right upper lobe lung nodule. Despite having her COPD, persistently refuses to quit smoking. She was referred by Dr. Chase Caller for evaluation of Coronary Calcification noted on CT of Chest.  Valerie Gay was Seen for initial consultation back on October 21 2016. She really has not had any true anginal symptoms. Just some occasional chest tightness.  Recent Hospitalizations: None  Studies Reviewed:   Chest CT 09/21/2016: Atherosclerotic calcification of the arterial vasculature including coronary arteries. Otherwise normal heart size and no effusion. Centrilobular emphysema with a 6 mm posterior right upper lobe nodule (unchanged). Right adrenal adenoma and bilateral renal stones   Myoview 11/03/2016: EF 55-65% with normal LV function. LOW RISK. No ischemia or infarction.  Interval History: [name] presents today really saying that she has her chronic exertional dyspnea from her COPD, but really has not had anymore episodes of chest tightness or pressure with rest or exertion. No PND, orthopnea or edema.  She says she has rare occasions of racing heart spells but really not a lot of palpitations and heart spells are relatively rare and are not associated with any lightheadedness, dizziness, wooziness. No syncope or near-syncope, no TIA or amaurosis fugax symptoms. She also does describe some episodes of pill dysphagia where she has pills get stuck in her throat. She is not very excited about the thought of having to  potentially take any other medications.  She denies any claudication type pain in her legs, but does note resting cramps.  ROS: A comprehensive was performed. Review of Systems  Constitutional: Negative for chills, fever, malaise/fatigue and weight loss.  HENT: Negative for congestion and nosebleeds.   Respiratory: Positive for cough, shortness of breath and wheezing. Negative for sputum production.   Cardiovascular: Positive for palpitations (Per history of present illness). Negative for chest pain.       Noted in history of present illness  Gastrointestinal: Negative for blood in stool, constipation, heartburn and melena.  Genitourinary: Negative for dysuria and hematuria.  Musculoskeletal: Positive for joint pain (Normal osteoarthritis pains) and myalgias ([place]).  Neurological: Negative for dizziness and weakness.  Endo/Heme/Allergies: Negative for environmental allergies. Does not bruise/bleed easily.  Psychiatric/Behavioral: Negative for depression and memory loss. The patient is not nervous/anxious and does not have insomnia.     Past Medical History:  Diagnosis Date  . Colon polyps    2010   . COPD (chronic obstructive pulmonary disease) ([place])   . Diverticulosis   . Hyperlipidemia     Past Surgical History:  Procedure Laterality Date  . CATARACT EXTRACTION Right    August 15, 2013  . CHOLECYSTECTOMY N/A 06/22/2014   Procedure: LAPAROSCOPIC CHOLECYSTECTOMY WITH INTRAOPERATIVE CHOLANGIOGRAM;  Surgeon: Gayland Curry, MD;  Location: [facility];  Service: General;  Laterality: N/A;  . NM MYOVIEW LTD  10/2016    EF 55-65% with normal LV function. LOW RISK. No ischemia or infarction.  . TONSILLECTOMY      Current Meds  Medication Sig  .  albuterol (PROVENTIL HFA;VENTOLIN HFA) 108 (90 BASE) MCG/ACT inhaler Inhale 2 puffs into the lungs every 6 (six) hours as needed for wheezing or shortness of breath.  [name] albuterol (PROVENTIL) (2.5 MG/3ML) 0.083% nebulizer solution inhale  contents of 1 vial in nebulizer every 8 hours if needed for wheezing or shortness of breath  . INCRUSE ELLIPTA 62.5 MCG/INH AEPB USE 1 INHALATION DAILY  . SYMBICORT 160-4.5 MCG/ACT inhaler USE 2 INHALATIONS TWICE A DAY    Allergies  Allergen Reactions  . Spiriva Handihaler [Tiotropium Bromide Monohydrate] Other (See Comments)    Itching, rash, hives on upper body    Social History   Social History  . Marital status: Divorced    Spouse name: N/A  . Number of children: N/A  . Years of education: N/A   Occupational History  . retired    Social History Main Topics  . Smoking status: Current Every Day Smoker    Packs/day: 0.50    Years: 60.00    Types: Cigarettes  . Smokeless tobacco: Never Used     Comment: 1/2 ppd 11.20.17  . Alcohol use No  . Drug use: Unknown  . Sexual activity: Not Asked   Other Topics Concern  . None   Social History Narrative  . None    family history includes Alzheimer's disease in her mother; Cancer in her father; Diabetes in her mother.  Wt Readings from Last 3 Encounters:  12/01/16 70.7 kg (155 lb 12.8 oz)  11/03/16 71.2 kg (157 lb)  10/21/16 71.5 kg (157 lb 9.6 oz)    PHYSICAL EXAM BP 120/61   Pulse 95   Ht 5\' 3"  (1.6 m)   Wt 70.7 kg (155 lb 12.8 oz)   BMI 27.60 kg/m  General appearance: Pleasant elderly woman who appears her stated age. She is in no acute distress. Well-nourished and well-groomed. HEENT: [place]/AT, EOMI, MMM, anicteric sclera Lungs: Somewhat reduced breath sounds throughout. But otherwise CTA B. Mild interstitial lung sounds, but no obvious rales or rhonchi. Mild expiratory wheezes noted with deep brisk expiration. Normal percussion bilaterally and non-labored Heart: regular rate and rhythm, S1, S2 normal, very soft 1/6 SEM . No click, rub or gallop Abdomen: soft, non-tender; bowel sounds normal; no masses,  no organomegaly; no HJR Extremities: extremities normal, atraumatic, no cyanosis, or edema  Pulses: 2+ and  symmetric;  Neurologic: Mental status: Alert, oriented, thought content appropriate    Adult ECG Report N/A  Other studies Reviewed: Additional studies/ records that were reviewed today include:  Recent Labs:  Last noted LDL ~149 (available on Care Everywhere) Lab Results  Component Value Date   CHOL 177 10/27/2016   HDL 40 (L) 10/27/2016   LDLCALC 119 (H) 10/27/2016   TRIG 91 10/27/2016   CHOLHDL 4.4 10/27/2016    ASSESSMENT / PLAN: Problem List Items Addressed This Visit    Hyperlipidemia LDL goal <100 (Chronic)    With known coronary disease, just nonocclusive by Myoview, would be more aggressive. Current LDL is 119. She would like to avoid medication therefore we discussed lifestyle modification with low cholesterol diet and exercise. We'll also like coenzyme Q 10. Recheck in 6 months and consider whether or not we need to use a statin.  After this, I think this can be managed by PCP.      Relevant Orders   Lipid Profile   Hepatic function panel   Coronary artery calcification (Chronic)    She is a long-term smoker with dyslipidemia and borderline hypertension who  was found to have coronary calcium additional CT scan. Myoview is negative for signs of ischemic coronary disease.  At this point I would simply be more aggressive with this factor modification including the potential management. She would like to avoid medications for now, therefore we will start with coenzyme Q 10 and then reassess - can probably be managed by PCP.      DOE (dyspnea on exertion) (Chronic)    Probably more related to COPD. She has normal EF by echo. Probably has some diastolic dysfunction from age, does not have high blood pressure.      TOBACCO USE    Has been counseled multiple times by Dr. Chase Caller. Not really interested in stopping smoking.      Medication management - Primary   Relevant Orders   Hepatic function panel    Other Visit Diagnoses    Encounter for immunization         Relevant Orders   Flu Vaccine QUAD 36+ mos IM (Completed)      Current medicines are reviewed at length with the patient today. (+/- concerns) n/a The following changes have been made: n/a  Patient Instructions  Medication Instructions:  START taking coenzyme Q10 100mg  for 2 weeks, then take 200mg  for 2 weeks, then start and continue taking 300mg .    Labwork: Please have labwork in May (lipid panel, Hepatic function).  We will mail lab slips to you at this time.   Testing/Procedures: NONE  Follow-Up: Follow up in June 2018 with Dr. Ellyn Hack     If you need a refill on your cardiac medications before your next appointment, please call your pharmacy.    Studies Ordered:   Orders Placed This Encounter  Procedures  . Flu Vaccine QUAD 36+ mos IM  . Lipid Profile  . Hepatic function panel      Glenetta Hew, M.D., M.S. Interventional Cardiologist   Pager # [phone] Phone # [phone] [address]. [place], [place] [address]

## 2016-12-01 NOTE — Patient Instructions (Signed)
Medication Instructions:  START taking coenzyme Q10 100mg  for 2 weeks, then take 200mg  for 2 weeks, then start and continue taking 300mg .    Labwork: Please have labwork in May (lipid panel, Hepatic function).  We will mail lab slips to you at this time.   Testing/Procedures: NONE  Follow-Up: Follow up in June 2018 with Dr. Ellyn Hack     If you need a refill on your cardiac medications before your next appointment, please call your pharmacy.

## 2016-12-03 ENCOUNTER — Encounter: Payer: Self-pay | Admitting: Cardiology

## 2016-12-03 NOTE — Assessment & Plan Note (Addendum)
With known coronary disease, just nonocclusive by Myoview, would be more aggressive. Current LDL is 119. She would like to avoid medication therefore we discussed lifestyle modification with low cholesterol diet and exercise. We'll also like coenzyme Q 10. Recheck in 6 months and consider whether or not we need to use a statin.  After this, I think this can be managed by PCP.

## 2016-12-03 NOTE — Assessment & Plan Note (Signed)
She is a long-term smoker with dyslipidemia and borderline hypertension who was found to have coronary calcium additional CT scan. Myoview is negative for signs of ischemic coronary disease.  At this point I would simply be more aggressive with this factor modification including the potential management. She would like to avoid medications for now, therefore we will start with coenzyme Q 10 and then reassess - can probably be managed by PCP.

## 2016-12-03 NOTE — Assessment & Plan Note (Signed)
Has been counseled multiple times by Dr. Chase Caller. Not really interested in stopping smoking.

## 2016-12-03 NOTE — Assessment & Plan Note (Signed)
Probably more related to COPD. She has normal EF by echo. Probably has some diastolic dysfunction from age, does not have high blood pressure.

## 2016-12-04 DIAGNOSIS — H40051 Ocular hypertension, right eye: Secondary | ICD-10-CM | POA: Diagnosis not present

## 2016-12-04 DIAGNOSIS — H524 Presbyopia: Secondary | ICD-10-CM | POA: Diagnosis not present

## 2016-12-13 ENCOUNTER — Encounter (HOSPITAL_COMMUNITY): Payer: Self-pay | Admitting: Emergency Medicine

## 2016-12-13 ENCOUNTER — Inpatient Hospital Stay (HOSPITAL_COMMUNITY)
Admission: EM | Admit: 2016-12-13 | Discharge: 2016-12-16 | DRG: 190 | Disposition: A | Discharge status: Skilled Nursing Facility | Payer: Medicare Other | Attending: Internal Medicine | Admitting: Internal Medicine

## 2016-12-13 ENCOUNTER — Emergency Department (HOSPITAL_COMMUNITY): Payer: Medicare Other

## 2016-12-13 DIAGNOSIS — J9621 Acute and chronic respiratory failure with hypoxia: Secondary | ICD-10-CM | POA: Diagnosis not present

## 2016-12-13 DIAGNOSIS — I2510 Atherosclerotic heart disease of native coronary artery without angina pectoris: Secondary | ICD-10-CM | POA: Diagnosis not present

## 2016-12-13 DIAGNOSIS — R05 Cough: Secondary | ICD-10-CM

## 2016-12-13 DIAGNOSIS — J9620 Acute and chronic respiratory failure, unspecified whether with hypoxia or hypercapnia: Secondary | ICD-10-CM | POA: Diagnosis present

## 2016-12-13 DIAGNOSIS — R1313 Dysphagia, pharyngeal phase: Secondary | ICD-10-CM | POA: Diagnosis not present

## 2016-12-13 DIAGNOSIS — R000 Tachycardia, unspecified: Secondary | ICD-10-CM | POA: Diagnosis present

## 2016-12-13 DIAGNOSIS — I2584 Coronary atherosclerosis due to calcified coronary lesion: Secondary | ICD-10-CM | POA: Diagnosis not present

## 2016-12-13 DIAGNOSIS — J441 Chronic obstructive pulmonary disease with (acute) exacerbation: Secondary | ICD-10-CM | POA: Diagnosis not present

## 2016-12-13 DIAGNOSIS — R278 Other lack of coordination: Secondary | ICD-10-CM | POA: Diagnosis not present

## 2016-12-13 DIAGNOSIS — R0602 Shortness of breath: Secondary | ICD-10-CM | POA: Diagnosis not present

## 2016-12-13 DIAGNOSIS — E785 Hyperlipidemia, unspecified: Secondary | ICD-10-CM | POA: Diagnosis present

## 2016-12-13 DIAGNOSIS — J069 Acute upper respiratory infection, unspecified: Secondary | ICD-10-CM | POA: Diagnosis present

## 2016-12-13 DIAGNOSIS — F17200 Nicotine dependence, unspecified, uncomplicated: Secondary | ICD-10-CM | POA: Diagnosis present

## 2016-12-13 DIAGNOSIS — Z7951 Long term (current) use of inhaled steroids: Secondary | ICD-10-CM | POA: Diagnosis not present

## 2016-12-13 DIAGNOSIS — F17210 Nicotine dependence, cigarettes, uncomplicated: Secondary | ICD-10-CM | POA: Diagnosis present

## 2016-12-13 DIAGNOSIS — Z86010 Personal history of colonic polyps: Secondary | ICD-10-CM | POA: Diagnosis not present

## 2016-12-13 DIAGNOSIS — R531 Weakness: Secondary | ICD-10-CM | POA: Diagnosis not present

## 2016-12-13 DIAGNOSIS — Z888 Allergy status to other drugs, medicaments and biological substances status: Secondary | ICD-10-CM

## 2016-12-13 DIAGNOSIS — R2689 Other abnormalities of gait and mobility: Secondary | ICD-10-CM | POA: Diagnosis not present

## 2016-12-13 DIAGNOSIS — R062 Wheezing: Secondary | ICD-10-CM

## 2016-12-13 DIAGNOSIS — J80 Acute respiratory distress syndrome: Secondary | ICD-10-CM | POA: Diagnosis not present

## 2016-12-13 DIAGNOSIS — R911 Solitary pulmonary nodule: Secondary | ICD-10-CM | POA: Diagnosis not present

## 2016-12-13 DIAGNOSIS — R059 Cough, unspecified: Secondary | ICD-10-CM

## 2016-12-13 DIAGNOSIS — Z79899 Other long term (current) drug therapy: Secondary | ICD-10-CM | POA: Diagnosis not present

## 2016-12-13 LAB — COMPREHENSIVE METABOLIC PANEL
ALK PHOS: 65 U/L (ref 38–126)
ALT: 15 U/L (ref 14–54)
AST: 26 U/L (ref 15–41)
Albumin: 3.8 g/dL (ref 3.5–5.0)
Anion gap: 13 (ref 5–15)
BUN: 27 mg/dL — AB (ref 6–20)
CALCIUM: 9.6 mg/dL (ref 8.9–10.3)
CO2: 24 mmol/L (ref 22–32)
Chloride: 101 mmol/L (ref 101–111)
Creatinine, Ser: 0.69 mg/dL (ref 0.44–1.00)
GFR calc Af Amer: >60 mL/min (ref >60)
GFR calc non Af Amer: >60 mL/min (ref >60)
GLUCOSE: 131 mg/dL — AB (ref 65–99)
Potassium: 3.9 mmol/L (ref 3.5–5.1)
SODIUM: 138 mmol/L (ref 135–145)
Total Bilirubin: 0.4 mg/dL (ref 0.3–1.2)
Total Protein: 7 g/dL (ref 6.5–8.1)

## 2016-12-13 LAB — CBC WITH DIFFERENTIAL/PLATELET
BASOS PCT: 0 %
Basophils Absolute: 0 10*3/uL (ref 0.0–0.1)
EOS ABS: 0 10*3/uL (ref 0.0–0.7)
EOS PCT: 0 %
HEMATOCRIT: 44.8 % (ref 36.0–46.0)
Hemoglobin: 14.8 g/dL (ref 12.0–15.0)
Lymphocytes Relative: 6 %
Lymphs Abs: 0.8 10*3/uL (ref 0.7–4.0)
MCH: 31.6 pg (ref 26.0–34.0)
MCHC: 33 g/dL (ref 30.0–36.0)
MCV: 95.5 fL (ref 78.0–100.0)
MONO ABS: 0.4 10*3/uL (ref 0.1–1.0)
MONOS PCT: 3 %
Neutro Abs: 11.6 10*3/uL — ABNORMAL HIGH (ref 1.7–7.7)
Neutrophils Relative %: 91 %
Platelets: 424 10*3/uL — ABNORMAL HIGH (ref 150–400)
RBC: 4.69 MIL/uL (ref 3.87–5.11)
RDW: 14.4 % (ref 11.5–15.5)
WBC: 12.8 10*3/uL — ABNORMAL HIGH (ref 4.0–10.5)

## 2016-12-13 LAB — URINALYSIS, ROUTINE W REFLEX MICROSCOPIC
Bilirubin Urine: NEGATIVE
Glucose, UA: 50 mg/dL — AB
Hgb urine dipstick: NEGATIVE
KETONES UR: 5 mg/dL — AB
Leukocytes, UA: NEGATIVE
NITRITE: NEGATIVE
PH: 5 (ref 5.0–8.0)
Protein, ur: 30 mg/dL — AB
Specific Gravity, Urine: 1.016 (ref 1.005–1.030)

## 2016-12-13 LAB — INFLUENZA PANEL BY PCR (TYPE A & B)
Influenza A By PCR: NEGATIVE
Influenza B By PCR: NEGATIVE

## 2016-12-13 LAB — I-STAT CG4 LACTIC ACID, ED
Lactic Acid, Venous: 1.17 mmol/L (ref 0.5–1.9)
Lactic Acid, Venous: 1.11 mmol/L (ref 0.5–1.9)

## 2016-12-13 MED ORDER — ONDANSETRON HCL 4 MG/2ML IJ SOLN: 4.0000 mg | Freq: Four times a day (QID) | INTRAMUSCULAR | Status: DC | PRN

## 2016-12-13 MED ORDER — METHYLPREDNISOLONE SODIUM SUCC 125 MG IJ SOLR
125.0000 mg | Freq: Once | INTRAMUSCULAR | Status: AC
  Administered 2016-12-13: 125 mg via INTRAVENOUS
  Filled 2016-12-13: qty 2

## 2016-12-13 MED ORDER — ACETAMINOPHEN 650 MG RE SUPP: 650.0000 mg | Freq: Four times a day (QID) | RECTAL | Status: DC | PRN

## 2016-12-13 MED ORDER — HYDROCODONE-ACETAMINOPHEN 5-325 MG PO TABS: 1.0000 | ORAL_TABLET | ORAL | Status: DC | PRN

## 2016-12-13 MED ORDER — ENOXAPARIN SODIUM 40 MG/0.4ML ~~LOC~~ SOLN
40.0000 mg | SUBCUTANEOUS | Status: DC
  Administered 2016-12-13 – 2016-12-15 (×3): 40 mg via SUBCUTANEOUS
  Filled 2016-12-13 (×3): qty 0.4

## 2016-12-13 MED ORDER — ALBUTEROL (5 MG/ML) CONTINUOUS INHALATION SOLN
10.0000 mg/h | INHALATION_SOLUTION | RESPIRATORY_TRACT | Status: DC
  Administered 2016-12-13: 10 mg/h via RESPIRATORY_TRACT

## 2016-12-13 MED ORDER — MOMETASONE FURO-FORMOTEROL FUM 200-5 MCG/ACT IN AERO
2.0000 | INHALATION_SPRAY | Freq: Two times a day (BID) | RESPIRATORY_TRACT | Status: DC
  Administered 2016-12-14 – 2016-12-15 (×3): 2 via RESPIRATORY_TRACT
  Filled 2016-12-13 (×3): qty 8.8

## 2016-12-13 MED ORDER — ONDANSETRON HCL 4 MG PO TABS: 4.0000 mg | ORAL_TABLET | Freq: Four times a day (QID) | ORAL | Status: DC | PRN

## 2016-12-13 MED ORDER — IPRATROPIUM BROMIDE 0.02 % IN SOLN
0.5000 mg | Freq: Once | RESPIRATORY_TRACT | Status: AC
  Administered 2016-12-13: 0.5 mg via RESPIRATORY_TRACT
  Filled 2016-12-13: qty 2.5

## 2016-12-13 MED ORDER — SODIUM CHLORIDE 0.9 % IV SOLN
INTRAVENOUS | Status: DC
  Administered 2016-12-14: via INTRAVENOUS

## 2016-12-13 MED ORDER — POLYETHYLENE GLYCOL 3350 17 G PO PACK: 17.0000 g | PACK | Freq: Every day | ORAL | Status: DC | PRN

## 2016-12-13 MED ORDER — ALBUTEROL (5 MG/ML) CONTINUOUS INHALATION SOLN
20.0000 mg/h | INHALATION_SOLUTION | Freq: Once | RESPIRATORY_TRACT | Status: AC
Start: 2016-12-13 — End: 2016-12-13
  Administered 2016-12-13: 20 mg/h via RESPIRATORY_TRACT
  Filled 2016-12-13: qty 20

## 2016-12-13 MED ORDER — BISACODYL 5 MG PO TBEC
5.0000 mg | DELAYED_RELEASE_TABLET | Freq: Every day | ORAL | Status: DC | PRN
  Filled 2016-12-13: qty 1

## 2016-12-13 MED ORDER — ALBUTEROL (5 MG/ML) CONTINUOUS INHALATION SOLN
20.0000 mg/h | INHALATION_SOLUTION | Freq: Once | RESPIRATORY_TRACT | Status: DC
  Filled 2016-12-13: qty 20

## 2016-12-13 MED ORDER — SODIUM CHLORIDE 0.9 % IV BOLUS (SEPSIS)
500.0000 mL | Freq: Once | INTRAVENOUS | Status: AC
  Administered 2016-12-13: 500 mL via INTRAVENOUS

## 2016-12-13 MED ORDER — METHYLPREDNISOLONE SODIUM SUCC 125 MG IJ SOLR
60.0000 mg | Freq: Four times a day (QID) | INTRAMUSCULAR | Status: DC
  Administered 2016-12-13 – 2016-12-15 (×8): 60 mg via INTRAVENOUS
  Filled 2016-12-13 (×8): qty 2

## 2016-12-13 MED ORDER — IPRATROPIUM-ALBUTEROL 0.5-2.5 (3) MG/3ML IN SOLN
3.0000 mL | RESPIRATORY_TRACT | Status: DC | PRN
  Administered 2016-12-14 – 2016-12-16 (×4): 3 mL via RESPIRATORY_TRACT
  Filled 2016-12-13 (×4): qty 3

## 2016-12-13 MED ORDER — LEVOFLOXACIN IN D5W 500 MG/100ML IV SOLN
500.0000 mg | INTRAVENOUS | Status: DC
  Administered 2016-12-13 – 2016-12-15 (×3): 500 mg via INTRAVENOUS
  Filled 2016-12-13 (×4): qty 100

## 2016-12-13 MED ORDER — ACETAMINOPHEN 325 MG PO TABS: 650.0000 mg | ORAL_TABLET | Freq: Four times a day (QID) | ORAL | Status: DC | PRN

## 2016-12-13 MED ORDER — NICOTINE 14 MG/24HR TD PT24
14.0000 mg | MEDICATED_PATCH | Freq: Every day | TRANSDERMAL | Status: DC
  Administered 2016-12-14 – 2016-12-16 (×4): 14 mg via TRANSDERMAL
  Filled 2016-12-13 (×4): qty 1

## 2016-12-13 MED ORDER — OSELTAMIVIR PHOSPHATE 75 MG PO CAPS
75.0000 mg | ORAL_CAPSULE | Freq: Once | ORAL | Status: DC
  Filled 2016-12-13: qty 1

## 2016-12-13 NOTE — ED Triage Notes (Signed)
Pt. Stated, I have COPD and I started with a runny nose and then I guess it bad my COPD worse.  Ive been SOB and I have a lot of phlegm.

## 2016-12-13 NOTE — ED Notes (Signed)
Attempted report x1. 

## 2016-12-13 NOTE — ED Notes (Signed)
Unsuccessful attempt to start IV x 2. Will have another RN try. Sharyn Lull, RN at bedside.

## 2016-12-13 NOTE — ED Notes (Signed)
Explained to pt need for UA. Pt reports she does not have to void at this time but will let this nurse know when she has to use the restroom.

## 2016-12-13 NOTE — ED Provider Notes (Signed)
[facility] DEPT Provider Note  CSN: [id number] Arrival date & time: 12/13/16  1435  History   Chief Complaint Chief Complaint  Patient presents with  . Shortness of Breath  . Nasal Congestion   HPI Valerie Gay is a 76 y.o. female.  The history is provided by the patient, medical records and a relative. No language interpreter was used.  Illness  This is a new problem. The current episode started more than 2 days ago. The problem occurs constantly. The problem has been gradually worsening. Associated symptoms include chest pain (tightness) and shortness of breath. Pertinent negatives include no abdominal pain and no headaches. The symptoms are aggravated by eating, coughing and exertion. The symptoms are relieved by rest.    Past Medical History:  Diagnosis Date  . Colon polyps    2010   . COPD (chronic obstructive pulmonary disease) ([place])   . Diverticulosis   . Hyperlipidemia    Patient Active Problem List   Diagnosis Date Noted  . Acute URI 12/13/2016  . COPD with acute exacerbation ([place]) 12/13/2016  . Coronary artery calcification 10/23/2016  . Hyperlipidemia LDL goal <100 10/23/2016  . Medication management 10/23/2016  . DOE (dyspnea on exertion) 10/23/2016  . Chest pain with moderate risk for cardiac etiology 10/23/2016  . Lung nodule 02/26/2015  . Nodule of right lung 11/28/2014  . Acute calculous cholecystitis 06/22/2014  . Acute cholecystitis 06/22/2014  . Pre-operative respiratory examination 06/22/2014  . Abnormal chest x-ray 05/28/2014  . COPD, moderate ([place]) 05/07/2014  . Acute on chronic respiratory failure ([place]) 03/27/2014  . COPD exacerbation ([place]) 03/27/2014  . CAP (community acquired pneumonia) 03/27/2014  . Nicotine abuse 03/27/2014  . Hypokalemia 03/27/2014  . Leukocytosis 03/27/2014  . TOBACCO USE 11/05/2008  . COPD 11/05/2008  . HYPERGLYCEMIA 11/05/2008  . ANEMIA-NOS 07/21/2007   Past Surgical History:  Procedure Laterality Date    . CATARACT EXTRACTION Right    August 15, 2013  . CHOLECYSTECTOMY N/A 06/22/2014   Procedure: LAPAROSCOPIC CHOLECYSTECTOMY WITH INTRAOPERATIVE CHOLANGIOGRAM;  Surgeon: Gayland Curry, MD;  Location: [facility];  Service: General;  Laterality: N/A;  . NM MYOVIEW LTD  10/2016    EF 55-65% with normal LV function. LOW RISK. No ischemia or infarction.  . TONSILLECTOMY     OB History    No data available      Home Medications    Prior to Admission medications   Medication Sig Start Date End Date Taking? Authorizing Provider  albuterol (PROVENTIL HFA;VENTOLIN HFA) 108 (90 BASE) MCG/ACT inhaler Inhale 2 puffs into the lungs every 6 (six) hours as needed for wheezing or shortness of breath. 10/01/15   Brand Males, MD  albuterol (PROVENTIL) (2.5 MG/3ML) 0.083% nebulizer solution inhale contents of 1 vial in nebulizer every 8 hours if needed for wheezing or shortness of breath 01/06/16   Brand Males, MD  Coenzyme Q10 100 MG capsule Take 3 capsules (300 mg total) by mouth daily. 12/01/16   Leonie Man, MD  INCRUSE ELLIPTA 62.5 MCG/INH AEPB USE 1 INHALATION DAILY 10/30/16   Brand Males, MD  SYMBICORT 160-4.5 MCG/ACT inhaler USE 2 INHALATIONS TWICE A DAY 09/09/16   Brand Males, MD   Family History Family History  Problem Relation Age of Onset  . Alzheimer's disease Mother   . Diabetes Mother   . Cancer Father    Social History Social History  Substance Use Topics  . Smoking status: Current Every Day Smoker    Packs/day: 0.50  Years: 60.00    Types: Cigarettes  . Smokeless tobacco: Current User     Comment: 1/2 ppd 11.20.17  . Alcohol use No    Allergies   Spiriva handihaler [tiotropium bromide monohydrate]  Review of Systems Review of Systems  Constitutional: Negative for chills and fever.  HENT: Positive for congestion.   Respiratory: Positive for cough, shortness of breath and wheezing.   Cardiovascular: Positive for chest pain (tightness). Negative for  palpitations and leg swelling.  Gastrointestinal: Negative for abdominal pain.  Neurological: Negative for headaches.  All other systems reviewed and are negative.   Physical Exam Updated Vital Signs BP 123/80   Pulse (!) 126   Temp 97.6 F (36.4 C) (Oral)   Resp 20   Ht 5\' 3"  (1.6 m)   Wt 70.3 kg   SpO2 97%   BMI 27.46 kg/m   Physical Exam  Constitutional: She is oriented to person, place, and time. No distress.  Elderly Caucasian female  HENT:  Head: Normocephalic and atraumatic.  Eyes: EOM are normal. Pupils are equal, round, and reactive to light.  Neck: Normal range of motion. Neck supple.  Cardiovascular: Regular rhythm and normal heart sounds.  Tachycardia present.   Pulmonary/Chest: No respiratory distress. She has wheezes.  Mildly tachypneic to low 20s when talking, speaking in short sentences, maintaining saturations on 2L [place] in mid 90s, decreased air movement, prolonged expiratory phase, expiratory wheeze  Abdominal: Soft. Bowel sounds are normal. She exhibits no distension. There is no tenderness.  Musculoskeletal: Normal range of motion.  Neurological: She is alert and oriented to person, place, and time.  Skin: Skin is warm and dry. Capillary refill takes less than 2 seconds. She is not diaphoretic.  Nursing note and vitals reviewed.   ED Treatments / Results  Labs (all labs ordered are listed, but only abnormal results are displayed) Labs Reviewed  COMPREHENSIVE METABOLIC PANEL - Abnormal; Notable for the following:       Result Value   Glucose, Bld 131 (*)    BUN 27 (*)    All other components within normal limits  CBC WITH DIFFERENTIAL/PLATELET - Abnormal; Notable for the following:    WBC 12.8 (*)    Platelets 424 (*)    Neutro Abs 11.6 (*)    All other components within normal limits  URINALYSIS, ROUTINE W REFLEX MICROSCOPIC - Abnormal; Notable for the following:    APPearance HAZY (*)    Glucose, UA 50 (*)    Ketones, ur 5 (*)    Protein, ur  30 (*)    Bacteria, UA RARE (*)    Squamous Epithelial / LPF 0-5 (*)    All other components within normal limits  CULTURE, EXPECTORATED SPUTUM-ASSESSMENT  BASIC METABOLIC PANEL  CBC  INFLUENZA PANEL BY PCR (TYPE A & B)  I-STAT CG4 LACTIC ACID, ED  I-STAT CG4 LACTIC ACID, ED   EKG  EKG Interpretation None      Radiology Dg Chest 2 View  Result Date: 12/13/2016 CLINICAL DATA:  Congestion, shortness of breath, productive cough EXAM: CHEST  2 VIEW COMPARISON:  Chest CT 09/21/2016 FINDINGS: There is hyperinflation of the lungs compatible with COPD. Heart and mediastinal contours are within normal limits. No focal opacities or effusions. No acute bony abnormality. IMPRESSION: COPD.  No active disease. Electronically Signed   By: Rolm Baptise M.D.   On: 12/13/2016 15:42   Procedures Procedures (including critical care time)  Medications Ordered in ED Medications  albuterol (PROVENTIL,VENTOLIN) solution  continuous neb (0 mg/hr Nebulization Stopped 12/13/16 1846)  mometasone-formoterol (DULERA) 200-5 MCG/ACT inhaler 2 puff (not administered)  enoxaparin (LOVENOX) injection 40 mg (not administered)  0.9 %  sodium chloride infusion (not administered)  acetaminophen (TYLENOL) tablet 650 mg (not administered)    Or  acetaminophen (TYLENOL) suppository 650 mg (not administered)  HYDROcodone-acetaminophen (NORCO/VICODIN) 5-325 MG per tablet 1-2 tablet (not administered)  polyethylene glycol (MIRALAX / GLYCOLAX) packet 17 g (not administered)  bisacodyl (DULCOLAX) EC tablet 5 mg (not administered)  ondansetron (ZOFRAN) tablet 4 mg (not administered)    Or  ondansetron (ZOFRAN) injection 4 mg (not administered)  ipratropium-albuterol (DUONEB) 0.5-2.5 (3) MG/3ML nebulizer solution 3 mL (not administered)  methylPREDNISolone sodium succinate (SOLU-MEDROL) 125 mg/2 mL injection 60 mg (not administered)  oseltamivir (TAMIFLU) capsule 75 mg (not administered)  levofloxacin (LEVAQUIN) IVPB 500 mg  (not administered)  sodium chloride 0.9 % bolus 500 mL (not administered)  methylPREDNISolone sodium succinate (SOLU-MEDROL) 125 mg/2 mL injection 125 mg (125 mg Intravenous Given 12/13/16 1713)  albuterol (PROVENTIL,VENTOLIN) solution continuous neb (20 mg/hr Nebulization Given 12/13/16 1935)  ipratropium (ATROVENT) nebulizer solution 0.5 mg (0.5 mg Nebulization Given 12/13/16 1935)    Initial Impression / Assessment and Plan / ED Course  I have reviewed the triage vital signs and the nursing notes.  76 y.o. female with above stated PMHx, HPI, and physical. PMHx of COPD (Symbicort, INH, Nebs, 2L [place] PRN). Symptom onset 5 days ago. Rhinorrhea, nasal congestion, nonproductive cough. Patient denies fever or chills. Symptoms worsened 2 days ago with the onset of shortness of breath and wheezing. He shouldn't used albuterol inhaler and nebulizers as well as took hold prednisone prescription but symptoms have continued to worsen.  Patient placed on continuous albuterol. Unable to give Atrovent secondary to allergy. Patient also given Solu-Medrol 125 mg. Chest x-ray showing no evidence of pneumonia. EKG showing no ST elevation/depression or new T-wave inversions or interval abnormalities but noting infrequent PVCs. Still with SOB. Upon talking with patient further, she was taking atrovent for several weeks then had hives after eating a banana. Will attempt 2nd continuous albuterol neb and atrovent. Still only 2L [place].  Laboratory and imaging results were personally reviewed by myself and used in the medical decision making of this patient's treatment and disposition.  Pt admitted to step down for further evaluation and management of COPD exacerbation. Pt understands and agrees with the plan and has no further questions or concerns.   Pt care discussed with and followed by my attending, Dr. Rosaria Ferries, MD Pager [phone]  Final Clinical Impressions(s) / ED Diagnoses   Final diagnoses:    Cough  SOB (shortness of breath)  Wheeze  COPD exacerbation [facility])   New Prescriptions New Prescriptions   No medications on file     Mayer Camel, MD 12/13/16 2011    Tanna Furry, MD 12/25/16 2306

## 2016-12-13 NOTE — H&P (Signed)
History and Physical    Valerie Gay [id number] DOB: 12/10/1940 DOA: 12/13/2016  PCP: No PCP Per Patient   Patient coming from: Home  Chief Complaint: Rhinorrhea, cough, dyspnea, wheezing   HPI: Valerie Gay is a 76 y.o. female with medical history significant for tobacco abuse and COPD with 1-2 L/m supplemental oxygen requirement at baseline, now presenting to the emergency department with progressive dyspnea and productive cough. Patient reports that she had been in her usual state of health until the development of rhinorrhea and sore throat the evening of 12/10/2016. By the following day, the patient had developed a productive cough, wheezing, and dyspnea. She was also experiencing general malaise and diffuse body aches at that time. Patient had some prednisone and doxycycline at home and took 40 mg prednisone on 12/11/2016 and 30 mg the subsequent 2 days. She reports taking one dose of 100 mg doxycycline today. She has also been using her home inhalers and nebulizers with increasing frequency, but has not been able to obtain any significant relief. She reports significant dyspnea while at rest for the past couple days and has been essentially bedbound due to this. She was exposed to a daughter-in-law with fever and upper respiratory illness approximately one week ago and her daughter had influenza approximately 3 weeks ago. Patient denies any lower extremity swelling or tenderness and denies orthopnea or PND. There has been no chest pain or palpitations.  ED Course: Upon arrival to the ED, patient is found to be afebrile, saturating adequately on 2 L/m supplemental oxygen, mildly tachycardic, with stable blood pressure. Patient was noted to have significantly increased work of breathing, even while at rest. EKG features a sinus tachycardia with rate 116 and right axis deviation. Chest x-ray is notable for chronic COPD changes, but no acute cardiopulmonary disease. Chemistry panels  notable for a marked elevation and BUN to creatinine ratio. CBC features a leukocytosis to 12,800 and a thrombocytosis with platelets 424,000. Lactic acid is reassuring at 1.17. Patient was treated with duo nebs and continuous albuterol treatments. She was also given 125 mg IV Solu-Medrol in the ED. She has improved only slightly with these measures and continues to be dyspneic at rest and will be admitted to the stepdown unit for ongoing evaluation and management of suspected exacerbation and COPD, likely precipitated by a viral upper respiratory illness.  Review of Systems:  All other systems reviewed and apart from HPI, are negative.  Past Medical History:  Diagnosis Date  . Colon polyps    2010   . COPD (chronic obstructive pulmonary disease) ([place])   . Diverticulosis   . Hyperlipidemia     Past Surgical History:  Procedure Laterality Date  . CATARACT EXTRACTION Right    August 15, 2013  . CHOLECYSTECTOMY N/A 06/22/2014   Procedure: LAPAROSCOPIC CHOLECYSTECTOMY WITH INTRAOPERATIVE CHOLANGIOGRAM;  Surgeon: Gayland Curry, MD;  Location: [facility];  Service: General;  Laterality: N/A;  . NM MYOVIEW LTD  10/2016    EF 55-65% with normal LV function. LOW RISK. No ischemia or infarction.  . TONSILLECTOMY       reports that she has been smoking Cigarettes.  She has a 30.00 pack-year smoking history. She uses smokeless tobacco. She reports that she does not drink alcohol or use drugs.  Allergies  Allergen Reactions  . Spiriva Handihaler [Tiotropium Bromide Monohydrate] Other (See Comments)    Itching, rash, hives on upper body    Family History  Problem Relation Age of Onset  .  Alzheimer's disease Mother   . Diabetes Mother   . Cancer Father      Prior to Admission medications   Medication Sig Start Date End Date Taking? Authorizing Provider  albuterol (PROVENTIL HFA;VENTOLIN HFA) 108 (90 BASE) MCG/ACT inhaler Inhale 2 puffs into the lungs every 6 (six) hours as needed for wheezing  or shortness of breath. 10/01/15   Brand Males, MD  albuterol (PROVENTIL) (2.5 MG/3ML) 0.083% nebulizer solution inhale contents of 1 vial in nebulizer every 8 hours if needed for wheezing or shortness of breath 01/06/16   Brand Males, MD  Coenzyme Q10 100 MG capsule Take 3 capsules (300 mg total) by mouth daily. 12/01/16   Leonie Man, MD  INCRUSE ELLIPTA 62.5 MCG/INH AEPB USE 1 INHALATION DAILY 10/30/16   Brand Males, MD  SYMBICORT 160-4.5 MCG/ACT inhaler USE 2 INHALATIONS TWICE A DAY 09/09/16   Brand Males, MD    Physical Exam: Vitals:   12/13/16 1745 12/13/16 1815 12/13/16 1831 12/13/16 1936  BP: 135/84 124/81 138/87   Pulse: 111 118 (!) 122 (!) 122  Resp: 26 (!) 28 20 25   Temp:      TempSrc:      SpO2: 98% 98% 96% 95%  Weight:      Height:          Constitutional: in respiratory distress with accessory muscle recruitment, tachypnea, dyspnea between 2-3 words. No pallor or diaphoresis.  Eyes: PERTLA, lids and conjunctivae normal ENMT: Mucous membranes are moist. Posterior pharynx clear of any exudate or lesions.   Neck: normal, supple, no masses, no thyromegaly Respiratory: Fine crackles and inspiratory/expiratory wheezes throughout. Increased WOB.  No cyanosis or pallor.   Cardiovascular: Rate ~120 and regular, no murmur or gallop appreciated. No significant JVD. Abdomen: No distension, no tenderness, no masses palpated. Bowel sounds normal.  Musculoskeletal: no clubbing / cyanosis. No joint deformity upper and lower extremities. Normal muscle tone.  Skin: no significant rashes, lesions, ulcers. Warm, dry, well-perfused. Neurologic: CN 2-12 grossly intact. Sensation intact, DTR normal. Strength 5/5 in all 4 limbs.  Psychiatric: Normal judgment and insight. Alert and oriented x 3. Normal mood and affect.     Labs on Admission: I have personally reviewed following labs and imaging studies  CBC:  Recent Labs Lab 12/13/16 1448  WBC 12.8*  NEUTROABS  11.6*  HGB 14.8  HCT 44.8  MCV 95.5  PLT 123456*   Basic Metabolic Panel:  Recent Labs Lab 12/13/16 1448  NA 138  K 3.9  CL 101  CO2 24  GLUCOSE 131*  BUN 27*  CREATININE 0.69  CALCIUM 9.6   GFR: Estimated Creatinine Clearance: 55.4 mL/min (by C-G formula based on SCr of 0.69 mg/dL). Liver Function Tests:  Recent Labs Lab 12/13/16 1448  AST 26  ALT 15  ALKPHOS 65  BILITOT 0.4  PROT 7.0  ALBUMIN 3.8   No results for input(s): LIPASE, AMYLASE in the last 168 hours. No results for input(s): AMMONIA in the last 168 hours. Coagulation Profile: No results for input(s): INR, PROTIME in the last 168 hours. Cardiac Enzymes: No results for input(s): CKTOTAL, CKMB, CKMBINDEX, TROPONINI in the last 168 hours. BNP (last 3 results) No results for input(s): PROBNP in the last 8760 hours. HbA1C: No results for input(s): HGBA1C in the last 72 hours. CBG: No results for input(s): GLUCAP in the last 168 hours. Lipid Profile: No results for input(s): CHOL, HDL, LDLCALC, TRIG, CHOLHDL, LDLDIRECT in the last 72 hours. Thyroid Function Tests: No results  for input(s): TSH, T4TOTAL, FREET4, T3FREE, THYROIDAB in the last 72 hours. Anemia Panel: No results for input(s): VITAMINB12, FOLATE, FERRITIN, TIBC, IRON, RETICCTPCT in the last 72 hours. Urine analysis:    Component Value Date/Time   COLORURINE YELLOW 03/29/2014 [place] 03/29/2014 1032   LABSPEC 1.030 03/29/2014 1032   PHURINE 6.0 03/29/2014 1032   GLUCOSEU NEGATIVE 03/29/2014 1032   HGBUR NEGATIVE 03/29/2014 [place] 03/29/2014 1032   [place] 03/29/2014 1032   PROTEINUR NEGATIVE 03/29/2014 1032   UROBILINOGEN 1.0 03/29/2014 1032   NITRITE NEGATIVE 03/29/2014 1032   LEUKOCYTESUR NEGATIVE 03/29/2014 1032   Sepsis Labs: @LABRCNTIP (procalcitonin:4,lacticidven:4) )No results found for this or any previous visit (from the past 240 hour(s)).   Radiological Exams on  Admission: Dg Chest 2 View  Result Date: 12/13/2016 CLINICAL DATA:  Congestion, shortness of breath, productive cough EXAM: CHEST  2 VIEW COMPARISON:  Chest CT 09/21/2016 FINDINGS: There is hyperinflation of the lungs compatible with COPD. Heart and mediastinal contours are within normal limits. No focal opacities or effusions. No acute bony abnormality. IMPRESSION: COPD.  No active disease. Electronically Signed   By: Rolm Baptise M.D.   On: 12/13/2016 15:42    EKG: Independently reviewed. Sinus tachycardia (rate 116), RAD.   Assessment/Plan  1. COPD with acute exacerbation - Sxs began 1/24 with rhinorrhea, sore throat, and malaise; this was followed shortly by progressive dyspnea and wheezing with productive cough  - CXR without a clear infiltrate; afebrile on admission; leukocytosis of uncertain significance in setting of home prednisone use, but thrombocytosis possibly reflective of infectious precipitant   - She has received DuoNeb, continuous albuterol nebs, and 125 mg IV Solu-Medrol in ED - Plan to continue nebs, Solu-Medrol, check sputum culture and start Levaquin  - She has improved only slightly in ED and will be admitted to SDU for prn BiPAP    2. Acute URI  - Pt reports rhinorrhea, sore throat, and malaise starting evening of 1/24 - She has had family contacts with similar illness  - Afebrile on presentation  - Check influenza PCR; will give one dose Tamiflu now given her severe respiratory illness requiring hospitalization; droplet precautions    3. Tobacco abuse  - Counseled toward cessation; pt not ready to consider quitting, but has reportedly been "cutting back" for years  - Nicotine patch offered  - RN asked to provide smoking-cessation information prior to discharge    DVT prophylaxis: sq Lovenox  Code Status: Full  Family Communication: Daughter updated at bedside Disposition Plan: Admit to stepdown  Consults called: None  Admission status: Inpatient     Vianne Bulls, MD [facility] Pager [phone]  If 7PM-7AM, please contact night-coverage [email or web address] Password [facility]  12/13/2016, 7:42 PM

## 2016-12-13 NOTE — ED Notes (Signed)
ED Provider at bedside. 

## 2016-12-13 NOTE — ED Notes (Signed)
RT at bedside.

## 2016-12-14 LAB — BASIC METABOLIC PANEL
Anion gap: 9 (ref 5–15)
BUN: 36 mg/dL — AB (ref 6–20)
CALCIUM: 8.7 mg/dL — AB (ref 8.9–10.3)
CHLORIDE: 103 mmol/L (ref 101–111)
CO2: 24 mmol/L (ref 22–32)
CREATININE: 0.84 mg/dL (ref 0.44–1.00)
Glucose, Bld: 162 mg/dL — ABNORMAL HIGH (ref 65–99)
Potassium: 3.4 mmol/L — ABNORMAL LOW (ref 3.5–5.1)
SODIUM: 136 mmol/L (ref 135–145)

## 2016-12-14 LAB — CBC
HCT: 40.7 % (ref 36.0–46.0)
Hemoglobin: 13.2 g/dL (ref 12.0–15.0)
MCH: 31.1 pg (ref 26.0–34.0)
MCHC: 32.4 g/dL (ref 30.0–36.0)
MCV: 95.8 fL (ref 78.0–100.0)
Platelets: 367 10*3/uL (ref 150–400)
RBC: 4.25 MIL/uL (ref 3.87–5.11)
RDW: 14.4 % (ref 11.5–15.5)
WBC: 14.9 10*3/uL — AB (ref 4.0–10.5)

## 2016-12-14 LAB — MRSA PCR SCREENING: MRSA BY PCR: NEGATIVE

## 2016-12-14 LAB — GLUCOSE, CAPILLARY: Glucose-Capillary: 130 mg/dL — ABNORMAL HIGH (ref 65–99)

## 2016-12-14 MED ORDER — GUAIFENESIN ER 600 MG PO TB12
600.0000 mg | ORAL_TABLET | Freq: Two times a day (BID) | ORAL | Status: DC
  Administered 2016-12-14 – 2016-12-16 (×5): 600 mg via ORAL
  Filled 2016-12-14 (×5): qty 1

## 2016-12-14 MED ORDER — ENSURE ENLIVE PO LIQD
237.0000 mL | Freq: Two times a day (BID) | ORAL | Status: DC
  Administered 2016-12-14 – 2016-12-16 (×5): 237 mL via ORAL

## 2016-12-14 MED ORDER — ORAL CARE MOUTH RINSE
15.0000 mL | Freq: Two times a day (BID) | OROMUCOSAL | Status: DC
  Administered 2016-12-14 – 2016-12-16 (×5): 15 mL via OROMUCOSAL

## 2016-12-14 MED ORDER — NYSTATIN 100000 UNIT/ML MT SUSP: 5.0000 mL | Freq: Four times a day (QID) | OROMUCOSAL | Status: DC

## 2016-12-14 MED ORDER — FLUCONAZOLE 200 MG PO TABS
200.0000 mg | ORAL_TABLET | Freq: Once | ORAL | Status: AC
  Administered 2016-12-14: 200 mg via ORAL
  Filled 2016-12-14: qty 1

## 2016-12-14 NOTE — Evaluation (Signed)
Occupational Therapy Evaluation Patient Details Name: Valerie Gay MRN: [id number] DOB: 1941-10-18 Today's Date: 12/14/2016    History of Present Illness 76 y.o. female with above stated PMHx, HPI, and physical. PMHx of COPD (Symbicort, INH, Nebs, 2L [place] PRN). Symptom onset 5 days ago. Rhinorrhea, nasal congestion, nonproductive cough   Clinical Impression   Pt reports being sedentary, but able to perform ADL and IADL independently prior to admission. She used 2L 02 at home as needed. Pt presents with poor activity tolerance, dyspnea and generalized weakness interfering with ability to perform self care and mobility. Pt with HR to 148 and 02 sats to 88% on 3L 02 during ambulation with walker. Will follow acutely, recommending short term rehab upon d/c as pt lives alone.    Follow Up Recommendations  SNF;Supervision/Assistance - 24 hour    Equipment Recommendations       Recommendations for Other Services       Precautions / Restrictions Precautions Precautions: Fall Precaution Comments: watch HR and O2 saturations      Mobility Bed Mobility Overal bed mobility: Needs Assistance Bed Mobility: Supine to Sit     Supine to sit: Supervision     General bed mobility comments: supervision for safety and line management, increased time and effort to perform. Patient reported unwell feeling upon exertion  Transfers Overall transfer level: Needs assistance Equipment used: Rolling walker (2 wheeled) Transfers: Sit to/from Stand Sit to Stand: Min guard         General transfer comment: Vcs for hand placement and positioning. Increased time and effort. Min gaurd for safety and stability    Balance Overall balance assessment: Needs assistance   Sitting balance-Leahy Scale: Fair Sitting balance - Comments: tripods for breath support, increased diaphramatic breathing with accessory muscle recruitment     Standing balance-Leahy Scale: Poor Standing balance comment:  reliance on UE support, some instability noted. Min guard to min assist based upon fatigue level                            ADL Overall ADL's : Needs assistance/impaired Eating/Feeding: Independent;Sitting Eating/Feeding Details (indicate cue type and reason): requires increased time due to fatigue Grooming: Wash/dry hands;Sitting;Set up   Upper Body Bathing: Minimal assistance;Sitting   Lower Body Bathing: Moderate assistance;Sit to/from stand   Upper Body Dressing : Minimal assistance;Sitting   Lower Body Dressing: Moderate assistance;Sit to/from stand   Toilet Transfer: Minimal assistance;RW;Ambulation   Toileting- Water quality scientist and Hygiene: Min guard;Sit to/from stand       Functional mobility during ADLs: Minimal assistance;Rolling walker;+2 for safety/equipment General ADL Comments: instructed in pursed lip breathing during exertion     Vision     Perception     Praxis      Pertinent Vitals/Pain Pain Assessment: No/denies pain     Hand Dominance Right   Extremity/Trunk Assessment Upper Extremity Assessment Upper Extremity Assessment: Generalized weakness   Lower Extremity Assessment Lower Extremity Assessment: Generalized weakness   Cervical / Trunk Assessment Cervical / Trunk Assessment: Kyphotic   Communication Communication Communication: No difficulties   Cognition Arousal/Alertness: Awake/alert Behavior During Therapy: Flat affect;Anxious Overall Cognitive Status: No family/caregiver present to determine baseline cognitive functioning                     General Comments       Exercises       Shoulder Instructions      Home Living  Family/patient expects to be discharged to:: Private residence Living Arrangements: Alone Available Help at Discharge: Family;Available PRN/intermittently (daughter lives nearby) Type of Home: House Home Access: Stairs to enter [employer] of Steps: 3 Entrance  Stairs-Rails: None Home Layout: Two level;Able to live on main level with bedroom/bathroom;Full bath on main level     Bathroom Shower/Tub: Occupational psychologist: Standard     Home Equipment: Environmental consultant - 4 wheels;Shower seat;Other (comment) (02)   Additional Comments: does not know how to use rollator, sleeps on couch      Prior Functioning/Environment Level of Independence: Independent        Comments: drives        OT Problem List: Decreased strength;Decreased activity tolerance;Impaired balance (sitting and/or standing);Decreased knowledge of use of DME or AE;Cardiopulmonary status limiting activity   OT Treatment/Interventions: Self-care/ADL training;DME and/or AE instruction;Energy conservation;Patient/family education;Balance training;Therapeutic activities    OT Goals(Current goals can be found in the care plan section) Acute Rehab OT Goals Patient Stated Goal: to go home OT Goal Formulation: With patient Time For Goal Achievement: 12/21/16 Potential to Achieve Goals: Good ADL Goals Pt Will Perform Grooming: standing;with supervision (2 activities) Pt Will Perform Upper Body Dressing: with set-up;sitting Pt Will Perform Lower Body Dressing: with supervision;sit to/from stand Pt Will Transfer to Toilet: with supervision;ambulating;regular height toilet Pt Will Perform Toileting - Clothing Manipulation and hygiene: with supervision;sit to/from stand Additional ADL Goal #1: Pt will state at least 3 energy conservation strategies during ADL and IADL independently.  OT Frequency: Min 2X/week   Barriers to D/C:            Co-evaluation PT/OT/SLP Co-Evaluation/Treatment: Yes Reason for Co-Treatment: For patient/therapist safety PT goals addressed during session: Mobility/safety with mobility OT goals addressed during session: ADL's and self-care      End of Session Equipment Utilized During Treatment: Gait belt;Rolling walker;Oxygen (3L during  activity) Nurse Communication: Mobility status  Activity Tolerance: Patient limited by fatigue;Treatment limited secondary to medical complications (Comment) (HR to 148, 02 to 88% on 3l) Patient left: in chair;with call bell/phone within reach;with chair alarm set   Time: 1000-1040 OT Time Calculation (min): 40 min Charges:  OT General Charges $OT Visit: 1 Procedure OT Evaluation $OT Eval Moderate Complexity: 1 Procedure OT Treatments $Self Care/Home Management : 8-22 mins G-Codes:    Malka So 12/14/2016, 11:09 AM  [phone]

## 2016-12-14 NOTE — Clinical Social Work Note (Signed)
Clinical Social Work Assessment  Patient Details  Name: Valerie Gay MRN: [id number] Date of Birth: [birth year]  Date of referral:  12/14/16               Reason for consult:  Facility Placement                Permission sought to share information with:  Facility Sport and exercise psychologist, Family Supports Permission granted to share information::  Yes, Verbal Permission Granted  Name::     [place]::  SNFs  Relationship::  Daughter  Contact Information:     Housing/Transportation Living arrangements for the past 2 months:  [place] of Information:  Patient, Adult Children Patient Interpreter Needed:  None Criminal Activity/Legal Involvement Pertinent to Current Situation/Hospitalization:  No - Comment as needed Significant Relationships:  Adult Children Lives with:  Self Do you feel safe going back to the place where you live?  No Need for family participation in patient care:  Yes (Comment)  Care giving concerns:  CSW received consult for possible SNF placement at time of discharge. CSW spoke with patient and patient's daughter regarding PT recommendation of SNF placement at time of discharge. Patient's daughter reported that patient lives alone and is currently unable to care for herself at home given patient's current physical needs and fall risk. Patient's daughter expressed understanding of PT recommendation and is agreeable to SNF placement at time of discharge. CSW to continue to follow and assist with discharge planning needs.   Social Worker assessment / plan:  CSW spoke with patient's daughter concerning possibility of rehab at [facility] before returning home.  Employment status:  Retired Forensic scientist:  Medicare PT Recommendations:  [place] / Referral to community resources:  [place]  Patient/Family's Response to care:  Patient recognizes need for rehab before returning home and is agreeable to a  SNF in [place]. Patient's daughter is unfamiliar with SNFs in the area.  Patient/Family's Understanding of and Emotional Response to Diagnosis, Current Treatment, and Prognosis:  Patient/family is realistic regarding therapy needs and expressed being hopeful for SNF placement. Patient expressed understanding of CSW role and discharge process. No questions/concerns about plan or treatment.    Emotional Assessment Appearance:  Appears stated age Attitude/Demeanor/Rapport:  Other (Appropriate) Affect (typically observed):  Accepting, Appropriate Orientation:  Oriented to Self, Oriented to Place, Oriented to  Time, Oriented to Situation Alcohol / Substance use:  Not Applicable Psych involvement (Current and /or in the community):  No (Comment)  Discharge Needs  Concerns to be addressed:  Care Coordination Readmission within the last 30 days:  No Current discharge risk:  Lives alone Barriers to Discharge:  Continued Medical Work up   [employer], [place] 12/14/2016, 4:27 PM

## 2016-12-14 NOTE — Consult Note (Signed)
[place] Nurse wound consult note Reason for Consult:  Inframammary ntertriginous skin breakdown Wound type: inframammary MASD (moisture associated skin damage) Wound bed: intense redness with satellite lesion, painful Drainage (amount, consistency, odor) yellow, with odor  Periwound: intact  Dressing procedure/placement/frequency: Fungal overgrowth, candida. Antimicrobial wicking fabric to manage intertriginous skin damage from moisture. Explained use to the patient. WOC ordered two pieces for use, will need to be cut to fit   Discussed POC with patient and bedside nurse.  Re consult if needed, will not follow at this time. Thanks  Shlonda Dolloff [employer], RN,CWOCN, CNS [phone])

## 2016-12-14 NOTE — NC FL2 (Signed)
[place] LEVEL OF CARE SCREENING TOOL     IDENTIFICATION  Patient Name: Valerie Gay Birthdate: 08-Feb-1941 Sex: female Admission Date (Current Location): 12/13/2016  [facility] and [place] Number:  Herbalist and Address:  [facility], [place] [address] [place], [place] [address]      Provider Number: [id number]  Attending Physician Name and Address:  Florencia Reasons, MD  Relative Name and Phone Number:  [place]    Current Level of Care: Hospital Recommended Level of Care: [place] Prior Approval Number:    Date Approved/Denied:   PASRR Number: [id number]  Discharge Plan: SNF    Current Diagnoses: Patient Active Problem List   Diagnosis Date Noted  . Acute URI 12/13/2016  . COPD with acute exacerbation ([place]) 12/13/2016  . Coronary artery calcification 10/23/2016  . Hyperlipidemia LDL goal <100 10/23/2016  . Medication management 10/23/2016  . DOE (dyspnea on exertion) 10/23/2016  . Chest pain with moderate risk for cardiac etiology 10/23/2016  . Lung nodule 02/26/2015  . Nodule of right lung 11/28/2014  . Acute calculous cholecystitis 06/22/2014  . Acute cholecystitis 06/22/2014  . Pre-operative respiratory examination 06/22/2014  . Abnormal chest x-ray 05/28/2014  . COPD, moderate ([place]) 05/07/2014  . Acute on chronic respiratory failure ([place]) 03/27/2014  . COPD exacerbation ([place]) 03/27/2014  . CAP (community acquired pneumonia) 03/27/2014  . Nicotine abuse 03/27/2014  . Hypokalemia 03/27/2014  . Leukocytosis 03/27/2014  . TOBACCO USE 11/05/2008  . COPD 11/05/2008  . HYPERGLYCEMIA 11/05/2008  . ANEMIA-NOS 07/21/2007    Orientation RESPIRATION BLADDER Height & Weight     Self, Time, Situation, Place  Tracheostomy Continent Weight: 66.6 kg (146 lb 14.4 oz) Height:  5\' 3"  (160 cm)  BEHAVIORAL SYMPTOMS/MOOD NEUROLOGICAL BOWEL NUTRITION STATUS      Continent Diet (Please see DC Summary)  AMBULATORY  STATUS COMMUNICATION OF NEEDS Skin   Limited Assist Verbally Normal                       Personal Care Assistance Level of Assistance  Bathing, Feeding, Dressing Bathing Assistance: Limited assistance Feeding assistance: Independent Dressing Assistance: Limited assistance     Functional Limitations Info  Hearing   Hearing Info: Impaired      SPECIAL CARE FACTORS FREQUENCY  PT (By licensed PT)     PT Frequency: 5x/week              Contractures      Additional Factors Info  Code Status, Allergies Code Status Info: Full Allergies Info: Spiriva Handihaler Tiotropium Bromide Monohydrate, Banana           Current Medications (12/14/2016):  This is the current hospital active medication list Current Facility-Administered Medications  Medication Dose Route Frequency Provider Last Rate Last Dose  . acetaminophen (TYLENOL) tablet 650 mg  650 mg Oral Q6H PRN Vianne Bulls, MD       Or  . acetaminophen (TYLENOL) suppository 650 mg  650 mg Rectal Q6H PRN Vianne Bulls, MD      . albuterol (PROVENTIL,VENTOLIN) solution continuous neb  10 mg/hr Nebulization Continuous Mayer Camel, MD   Stopped at 12/13/16 1846  . bisacodyl (DULCOLAX) EC tablet 5 mg  5 mg Oral Daily PRN Ilene Qua Opyd, MD      . enoxaparin (LOVENOX) injection 40 mg  40 mg Subcutaneous Q24H Vianne Bulls, MD   40 mg at 12/13/16 2353  . feeding supplement (  ENSURE ENLIVE) (ENSURE ENLIVE) liquid 237 mL  237 mL Oral BID BM Florencia Reasons, MD   237 mL at 12/14/16 1400  . guaiFENesin (MUCINEX) 12 hr tablet 600 mg  600 mg Oral BID Florencia Reasons, MD   600 mg at 12/14/16 1459  . HYDROcodone-acetaminophen (NORCO/VICODIN) 5-325 MG per tablet 1-2 tablet  1-2 tablet Oral Q4H PRN Ilene Qua Opyd, MD      . ipratropium-albuterol (DUONEB) 0.5-2.5 (3) MG/3ML nebulizer solution 3 mL  3 mL Nebulization Q2H PRN Vianne Bulls, MD      . levofloxacin (LEVAQUIN) IVPB 500 mg  500 mg Intravenous Q24H Vianne Bulls, MD   500 mg at 12/13/16  2355  . MEDLINE mouth rinse  15 mL Mouth Rinse BID Florencia Reasons, MD   15 mL at 12/14/16 1459  . methylPREDNISolone sodium succinate (SOLU-MEDROL) 125 mg/2 mL injection 60 mg  60 mg Intravenous Q6H Vianne Bulls, MD   60 mg at 12/14/16 1253  . mometasone-formoterol (DULERA) 200-5 MCG/ACT inhaler 2 puff  2 puff Inhalation BID Ilene Qua Opyd, MD      . nicotine (NICODERM CQ - dosed in mg/24 hours) patch 14 mg  14 mg Transdermal Daily Vianne Bulls, MD   14 mg at 12/14/16 0943  . ondansetron (ZOFRAN) tablet 4 mg  4 mg Oral Q6H PRN Vianne Bulls, MD       Or  . ondansetron (ZOFRAN) injection 4 mg  4 mg Intravenous Q6H PRN Ilene Qua Opyd, MD      . polyethylene glycol (MIRALAX / GLYCOLAX) packet 17 g  17 g Oral Daily PRN Vianne Bulls, MD         Discharge Medications: Please see discharge summary for a list of discharge medications.  Relevant Imaging Results:  Relevant Lab Results:   Additional Information SSN: [facility], [place]

## 2016-12-14 NOTE — Progress Notes (Signed)
PROGRESS NOTE  Valerie Gay [id number] DOB: 04-Feb-1941 DOA: 12/13/2016 PCP: No PCP Per Patient  HPI/Recap of past 24 hours:  Feeling better, denies pain, no fever, on 2liter oxygen, heart rate goes up with minimal exertion.   Assessment/Plan: Principal Problem:   COPD exacerbation (HCC) Active Problems:   TOBACCO USE   Acute on chronic respiratory failure (HCC)   Lung nodule   Coronary artery calcification   Acute URI   COPD with acute exacerbation ([place])  1. COPD with acute exacerbation - Sxs began 1/24 with rhinorrhea, sore throat, and malaise; this was followed shortly by progressive dyspnea and wheezing with productive cough  - CXR without a clear infiltrate; afebrile on admission; leukocytosis of uncertain significance in setting of home prednisone use, but thrombocytosis possibly reflective of infectious precipitant   - continue nebs, Solu-Medrol, check sputum culture and start Levaquin  - improving, but still very frail, keep in stepdown for prn BiPAP    2. Acute URI  - Pt reports rhinorrhea, sore throat, and malaise starting evening of 1/24 - She has had family contacts with similar illness  - Afebrile on presentation  -  influenza PCR negative,; empirically started Tamiflu has stopped    3. Tobacco abuse  - Counseled toward cessation; pt not ready to consider quitting, but has reportedly been "cutting back" for years  - Nicotine patch offered  - RN asked to provide smoking-cessation information prior to discharge   4. Inframammary intertriginous skin breakdown with fungal overgrowth under both breast Wound care input appreciated Antimicrobial wicking fabric to manage intertriginous skin damage from moisture Oral diflucan   5. FTT: PT/OT/SNF placement  DVT prophylaxis: sq Lovenox  Code Status: Full  Family Communication: patient Disposition Plan: remain in stepdown , snf once clinically improved in 1-2 days Consults called: None     Procedures:  none  Antibiotics:  levaquin   Objective: BP (!) 118/52 (BP Location: Right Arm)   Pulse (!) 101   Temp 97.6 F (36.4 C) (Oral)   Resp (!) 22   Ht 5\' 3"  (1.6 m)   Wt 66.6 kg (146 lb 14.4 oz)   SpO2 95%   BMI 26.02 kg/m   Intake/Output Summary (Last 24 hours) at 12/14/16 0751 Last data filed at 12/14/16 0003  Gross per 24 hour  Intake              740 ml  Output                0 ml  Net              740 ml   Filed Weights   12/13/16 1441 12/13/16 2130 12/14/16 0338  Weight: 70.3 kg (155 lb) 70.8 kg (156 lb) 66.6 kg (146 lb 14.4 oz)    Exam:   General:  Very frail, no distress at rest  Cardiovascular: intermittent sinus tachycardia  Respiratory: diffuse bilateral expiratory wheezing  Abdomen: Soft/ND/NT, positive BS  Musculoskeletal: No Edema  Neuro: aaox3  Data Reviewed: Basic Metabolic Panel:  Recent Labs Lab 12/13/16 1448 12/14/16 0250  NA 138 136  K 3.9 3.4*  CL 101 103  CO2 24 24  GLUCOSE 131* 162*  BUN 27* 36*  CREATININE 0.69 0.84  CALCIUM 9.6 8.7*   Liver Function Tests:  Recent Labs Lab 12/13/16 1448  AST 26  ALT 15  ALKPHOS 65  BILITOT 0.4  PROT 7.0  ALBUMIN 3.8   No results for input(s): LIPASE, AMYLASE in  the last 168 hours. No results for input(s): AMMONIA in the last 168 hours. CBC:  Recent Labs Lab 12/13/16 1448 12/14/16 0250  WBC 12.8* 14.9*  NEUTROABS 11.6*  --   HGB 14.8 13.2  HCT 44.8 40.7  MCV 95.5 95.8  PLT 424* 367   Cardiac Enzymes:   No results for input(s): CKTOTAL, CKMB, CKMBINDEX, TROPONINI in the last 168 hours. BNP (last 3 results) No results for input(s): BNP in the last 8760 hours.  ProBNP (last 3 results) No results for input(s): PROBNP in the last 8760 hours.  CBG:  Recent Labs Lab 12/14/16 0736  GLUCAP 130*    Recent Results (from the past 240 hour(s))  MRSA PCR Screening     Status: None   Collection Time: 12/13/16  9:31 PM  Result Value Ref Range Status    MRSA by PCR NEGATIVE NEGATIVE Final    Comment:        The GeneXpert MRSA Assay (FDA approved for NASAL specimens only), is one component of a comprehensive MRSA colonization surveillance program. It is not intended to diagnose MRSA infection nor to guide or monitor treatment for MRSA infections.      Studies: Dg Chest 2 View  Result Date: 12/13/2016 CLINICAL DATA:  Congestion, shortness of breath, productive cough EXAM: CHEST  2 VIEW COMPARISON:  Chest CT 09/21/2016 FINDINGS: There is hyperinflation of the lungs compatible with COPD. Heart and mediastinal contours are within normal limits. No focal opacities or effusions. No acute bony abnormality. IMPRESSION: COPD.  No active disease. Electronically Signed   By: Rolm Baptise M.D.   On: 12/13/2016 15:42    Scheduled Meds: . enoxaparin (LOVENOX) injection  40 mg Subcutaneous Q24H  . levofloxacin (LEVAQUIN) IV  500 mg Intravenous Q24H  . methylPREDNISolone (SOLU-MEDROL) injection  60 mg Intravenous Q6H  . mometasone-formoterol  2 puff Inhalation BID  . nicotine  14 mg Transdermal Daily    Continuous Infusions: . albuterol Stopped (12/13/16 1846)     Time spent: 84mins  Tanyon Alipio MD, PhD  [facility] Pager [phone]. If 7PM-7AM, please contact night-coverage at [email or web address], password [facility] 12/14/2016, 7:51 AM  LOS: 1 day

## 2016-12-14 NOTE — Progress Notes (Signed)
Initial Nutrition Assessment  DOCUMENTATION CODES:   Not applicable  INTERVENTION:  Ensure Enlive po BID, each supplement provides 350 kcal and 20 grams of protein  snacks  NUTRITION DIAGNOSIS:   Inadequate oral intake related to poor appetite, acute illness as evidenced by meal completion < 25%, and 6% percent weight loss in 2 weeks.  GOAL:   Patient will meet greater than or equal to 90% of their needs  MONITOR:   PO intake, Supplement acceptance, Weight trends, Labs  REASON FOR ASSESSMENT:   Consult Assessment of nutrition requirement/status  ASSESSMENT:    76 y.o. female with medical history significant for tobacco abuse and COPD with 1-2 L/m supplemental oxygen requirement at baseline, now presenting to the emergency department with progressive dyspnea and productive cough. Found to have URI and COPD exacerbation   Met with pt in room today. Pt reports poor appetite and oral intake for 3 days pta. Pt remains with poor appetite and is currently eating <25% meals. RD observed pt's breakfast tray today. Pt had eaten a few eggs and an applesauce. Pt likes yogurt and other soft moist foods. RD will order snacks and some supplements. Per chart, pt has lost 9lbs(6%) in two weeks which is significant.   Medications reviewed and include: lovenox, levofloxacin, solu-medrol, nicotine  Labs reviewed: K 3.4(L), BUN 36(H), Ca 8.7(L)  Wbc- 14.9(H)  Nutrition-Focused physical exam completed. Findings are no fat depletion, no muscle depletion, and no edema.   Diet Order:  Diet regular Room service appropriate? Yes; Fluid consistency: Thin  Skin:  Reviewed, no issues  Last BM:  1/28  Height:   Ht Readings from Last 1 Encounters:  12/13/16 5' 3"  (1.6 m)    Weight:   Wt Readings from Last 1 Encounters:  12/14/16 146 lb 14.4 oz (66.6 kg)    Ideal Body Weight:  52.3 kg  BMI:  Body mass index is 26.02 kg/m.  Estimated Nutritional Needs:   Kcal:  1300-1600kcal/day    Protein:  73-86g/day   Fluid:  >1.3L/day   EDUCATION NEEDS:   Education needs no appropriate at this time  Koleen Distance, RD, [place] Pager #[phone]

## 2016-12-14 NOTE — Progress Notes (Signed)
PRN SAB given for Coarse Expiratory Wheeze. No acute distress noted.

## 2016-12-14 NOTE — Evaluation (Signed)
Physical Therapy Evaluation Patient Details Name: Valerie Gay MRN: [id number] DOB: 10-24-1941 Today's Date: 12/14/2016   History of Present Illness  76 y.o. female with above stated PMHx, HPI, and physical. PMHx of COPD (Symbicort, INH, Nebs, 2L Platter PRN). Symptom onset 5 days ago. Rhinorrhea, nasal congestion, nonproductive cough  Clinical Impression  Patient seen for mobility assessment. At this time, patient demonstrates deficits in functional mobility and activity tolerance as indicated below. Will need continued skilled PT to address deficits and maximize function. Will see as indicated and progress as tolerated. At this time, recommend ST SNF as patient resides alone and appears to have had difficulty caring for herself at home.  OF NOTE: During activity, Hr elevation to 148 with very modest activity, SpO2 dropped to 88% on 3 liters and patient reports DOE 3/4 with audible wheezing and heavy recruitment of accessory muscles during breathing    Follow Up Recommendations SNF;Supervision/Assistance - 24 hour    Equipment Recommendations  None recommended by PT    Recommendations for Other Services       Precautions / Restrictions Precautions Precautions: Fall Precaution Comments: watch HR and O2 saturations      Mobility  Bed Mobility Overal bed mobility: Needs Assistance Bed Mobility: Supine to Sit     Supine to sit: Supervision     General bed mobility comments: supervision for safety and line management, increased time and effort to perform. Patient reported unwell feeling upon exertion  Transfers Overall transfer level: Needs assistance Equipment used: Rolling walker (2 wheeled) Transfers: Sit to/from Stand Sit to Stand: Min guard         General transfer comment: Vcs for hand placement and positioning. Increased time and effort. Min gaurd for safety and stability  Ambulation/Gait Ambulation/Gait assistance: Min guard;Min assist Ambulation Distance  (Feet): 50 Feet Assistive device: Rolling walker (2 wheeled) Gait Pattern/deviations: Step-through pattern;Decreased stride length;Trunk flexed;Drifts right/left;Shuffle Gait velocity: decreased Gait velocity interpretation: <1.8 ft/sec, indicative of risk for recurrent falls General Gait Details: patient required 3 standing rest breaks for ambulation, ambulated on 3 liters with saturations dropping to 88%, patient with 3/4 DOE. HR elevated to upper 140s with modest activity. chair follow required  Stairs            Wheelchair Mobility    Modified Rankin (Stroke Patients Only)       Balance Overall balance assessment: Needs assistance   Sitting balance-Leahy Scale: Fair Sitting balance - Comments: tripods for breath support, increased diaphramatic breathing with accessory muscle recruitment     Standing balance-Leahy Scale: Poor Standing balance comment: reliance on UE support, some instability noted. Min guard to min assist based upon fatigue level                             Pertinent Vitals/Pain Pain Assessment: No/denies pain    Home Living Family/patient expects to be discharged to:: Private residence Living Arrangements: Alone Available Help at Discharge: Family;Available PRN/intermittently (daughter lives nearby) Type of Home: House Home Access: Stairs to enter Entrance Stairs-Rails: None Entrance Stairs-Number of Steps: 3 Home Layout: Two level;Able to live on main level with bedroom/bathroom;Full bath on main level Home Equipment: Walker - 4 wheels;Shower seat;Other (comment) (02) Additional Comments: does not know how to use rollator, sleeps on couch    Prior Function Level of Independence: Independent         Comments: drives     Hand Dominance   Dominant Hand:  Right    Extremity/Trunk Assessment   Upper Extremity Assessment Upper Extremity Assessment: Generalized weakness    Lower Extremity Assessment Lower Extremity  Assessment: Generalized weakness    Cervical / Trunk Assessment Cervical / Trunk Assessment: Kyphotic  Communication   Communication: No difficulties  Cognition Arousal/Alertness: Awake/alert Behavior During Therapy: Flat affect;Anxious Overall Cognitive Status: No family/caregiver present to determine baseline cognitive functioning                      General Comments      Exercises     Assessment/Plan    PT Assessment Patient needs continued PT services  PT Problem List Decreased strength;Decreased activity tolerance;Decreased balance;Decreased mobility;Decreased safety awareness;Cardiopulmonary status limiting activity          PT Treatment Interventions DME instruction;Gait training;Stair training;Functional mobility training;Therapeutic activities;Therapeutic exercise;Balance training    PT Goals (Current goals can be found in the Care Plan section)  Acute Rehab PT Goals Patient Stated Goal: to go home PT Goal Formulation: With patient Time For Goal Achievement: 12/28/16 Potential to Achieve Goals: Good    Frequency Min 3X/week   Barriers to discharge Inaccessible home environment;Decreased caregiver support      Co-evaluation PT/OT/SLP Co-Evaluation/Treatment: Yes Reason for Co-Treatment: For patient/therapist safety PT goals addressed during session: Mobility/safety with mobility OT goals addressed during session: ADL's and self-care       End of Session Equipment Utilized During Treatment: Gait belt;Oxygen Activity Tolerance: Patient limited by fatigue Patient left: in chair;with call bell/phone within reach;with chair alarm set Nurse Communication: Mobility status         Time: 1020-1040 PT Time Calculation (min) (ACUTE ONLY): 20 min   Charges:   PT Evaluation $PT Eval Moderate Complexity: 1 Procedure     PT G Codes:        Duncan Dull Jan 01, 2017, 10:55 AM Alben Deeds, PT DPT  [phone]

## 2016-12-15 LAB — EXPECTORATED SPUTUM ASSESSMENT W REFEX TO RESP CULTURE

## 2016-12-15 LAB — CBC WITH DIFFERENTIAL/PLATELET
BASOS PCT: 0 %
Basophils Absolute: 0 10*3/uL (ref 0.0–0.1)
EOS PCT: 0 %
Eosinophils Absolute: 0 10*3/uL (ref 0.0–0.7)
HEMATOCRIT: 38.2 % (ref 36.0–46.0)
HEMOGLOBIN: 12.3 g/dL (ref 12.0–15.0)
LYMPHS ABS: 1.3 10*3/uL (ref 0.7–4.0)
Lymphocytes Relative: 6 %
MCH: 31.1 pg (ref 26.0–34.0)
MCHC: 32.2 g/dL (ref 30.0–36.0)
MCV: 96.5 fL (ref 78.0–100.0)
MONOS PCT: 2 %
Monocytes Absolute: 0.4 10*3/uL (ref 0.1–1.0)
NEUTROS ABS: 20.2 10*3/uL — AB (ref 1.7–7.7)
Neutrophils Relative %: 92 %
Platelets: 372 10*3/uL (ref 150–400)
RBC: 3.96 MIL/uL (ref 3.87–5.11)
RDW: 14.6 % (ref 11.5–15.5)
WBC: 21.9 10*3/uL — ABNORMAL HIGH (ref 4.0–10.5)

## 2016-12-15 LAB — GLUCOSE, CAPILLARY: Glucose-Capillary: 125 mg/dL — ABNORMAL HIGH (ref 65–99)

## 2016-12-15 LAB — BASIC METABOLIC PANEL
Anion gap: 9 (ref 5–15)
BUN: 37 mg/dL — ABNORMAL HIGH (ref 6–20)
CHLORIDE: 106 mmol/L (ref 101–111)
CO2: 27 mmol/L (ref 22–32)
Calcium: 9.3 mg/dL (ref 8.9–10.3)
Creatinine, Ser: 0.66 mg/dL (ref 0.44–1.00)
GFR calc Af Amer: >60 mL/min (ref >60)
GFR calc non Af Amer: >60 mL/min (ref >60)
GLUCOSE: 146 mg/dL — AB (ref 65–99)
Potassium: 4.5 mmol/L (ref 3.5–5.1)
Sodium: 142 mmol/L (ref 135–145)

## 2016-12-15 LAB — EXPECTORATED SPUTUM ASSESSMENT W GRAM STAIN, RFLX TO RESP C: Special Requests: NORMAL

## 2016-12-15 LAB — MAGNESIUM: Magnesium: 2.1 mg/dL (ref 1.7–2.4)

## 2016-12-15 MED ORDER — NYSTATIN 100000 UNIT/GM EX POWD
Freq: Two times a day (BID) | CUTANEOUS | Status: DC
  Administered 2016-12-15 – 2016-12-16 (×2): via TOPICAL
  Filled 2016-12-15: qty 15

## 2016-12-15 MED ORDER — ASPIRIN EC 81 MG PO TBEC
81.0000 mg | DELAYED_RELEASE_TABLET | Freq: Every day | ORAL | Status: DC
  Administered 2016-12-15: 81 mg via ORAL
  Filled 2016-12-15: qty 1

## 2016-12-15 MED ORDER — FLUCONAZOLE 100 MG PO TABS
100.0000 mg | ORAL_TABLET | Freq: Every day | ORAL | Status: DC
  Administered 2016-12-15 – 2016-12-16 (×2): 100 mg via ORAL
  Filled 2016-12-15 (×3): qty 1

## 2016-12-15 MED ORDER — METHYLPREDNISOLONE SODIUM SUCC 125 MG IJ SOLR
60.0000 mg | Freq: Two times a day (BID) | INTRAMUSCULAR | Status: DC
  Administered 2016-12-16 (×2): 60 mg via INTRAVENOUS
  Filled 2016-12-15 (×2): qty 2

## 2016-12-15 NOTE — Progress Notes (Signed)
PROGRESS NOTE  Valerie Gay [id number] DOB: 1941-03-14 DOA: 12/13/2016 PCP: No PCP Per Patient  HPI/Recap of past 24 hours:  Feeling better, denies pain, no fever, on 2liter oxygen, heart rate has much improved  Assessment/Plan: Principal Problem:   COPD exacerbation ([place]) Active Problems:   TOBACCO USE   Acute on chronic respiratory failure ([facility])   Lung nodule   Coronary artery calcification   Acute URI   COPD with acute exacerbation ([place])  1. COPD with acute exacerbation - Sxs began 1/24 with rhinorrhea, sore throat, and malaise; this was followed shortly by progressive dyspnea and wheezing with productive cough  - CXR without a clear infiltrate; afebrile on admission; leukocytosis of uncertain significance in setting of home prednisone use, but thrombocytosis possibly reflective of infectious precipitant   - continue nebs, Solu-Medrol, Levaquin ,sputum culture in process    - improving, transfer from stepdown to med surg on 1/30  2. Acute URI  - Pt reports rhinorrhea, sore throat, and malaise starting evening of 1/24 - She has had family contacts with similar illness  - Afebrile on presentation  -  influenza PCR negative,; empirically started Tamiflu has stopped    3. Tobacco abuse  - Counseled toward cessation; pt not ready to consider quitting, but has reportedly been "cutting back" for years  - Nicotine patch offered  - RN asked to provide smoking-cessation information prior to discharge   4. Inframammary intertriginous skin breakdown with fungal overgrowth under both breasts Wound care input appreciated Antimicrobial wicking fabric to manage intertriginous skin damage from moisture Topical nystatin Oral diflucan   5. FTT: PT/OT/SNF placement  DVT prophylaxis: sq Lovenox  Code Status: Full  Family Communication: patient Disposition Plan: snf once clinically improved in 1-2 days, taper steroids, Consults called: None     Procedures:  none  Antibiotics:  levaquin   Objective: BP (!) 120/59   Pulse 77   Temp 97.6 F (36.4 C) (Oral)   Resp (!) 23   Ht 5\' 3"  (1.6 m)   Wt 69.1 kg (152 lb 5.4 oz)   SpO2 92%   BMI 26.99 kg/m   Intake/Output Summary (Last 24 hours) at 12/15/16 0829 Last data filed at 12/14/16 2200  Gross per 24 hour  Intake             1975 ml  Output                2 ml  Net             1973 ml   Filed Weights   12/13/16 2130 12/14/16 0338 12/15/16 0353  Weight: 70.8 kg (156 lb) 66.6 kg (146 lb 14.4 oz) 69.1 kg (152 lb 5.4 oz)    Exam:   General:  Very frail, no distress at rest  Cardiovascular: intermittent sinus tachycardia has much imprved  Respiratory: diffuse bilateral expiratory wheezing has improved  Abdomen: Soft/ND/NT, positive BS  Musculoskeletal: No Edema  Neuro: aaox3  Skin:  Inframammary intertriginous skin breakdown under both breasts  Data Reviewed: Basic Metabolic Panel:  Recent Labs Lab 12/13/16 1448 12/14/16 0250 12/15/16 0216  NA 138 136 142  K 3.9 3.4* 4.5  CL 101 103 106  CO2 24 24 27   GLUCOSE 131* 162* 146*  BUN 27* 36* 37*  CREATININE 0.69 0.84 0.66  CALCIUM 9.6 8.7* 9.3  MG  --   --  2.1   Liver Function Tests:  Recent Labs Lab 12/13/16 1448  AST 26  ALT 15  ALKPHOS 65  BILITOT 0.4  PROT 7.0  ALBUMIN 3.8   No results for input(s): LIPASE, AMYLASE in the last 168 hours. No results for input(s): AMMONIA in the last 168 hours. CBC:  Recent Labs Lab 12/13/16 1448 12/14/16 0250 12/15/16 0216  WBC 12.8* 14.9* 21.9*  NEUTROABS 11.6*  --  20.2*  HGB 14.8 13.2 12.3  HCT 44.8 40.7 38.2  MCV 95.5 95.8 96.5  PLT 424* 367 372   Cardiac Enzymes:   No results for input(s): CKTOTAL, CKMB, CKMBINDEX, TROPONINI in the last 168 hours. BNP (last 3 results) No results for input(s): BNP in the last 8760 hours.  ProBNP (last 3 results) No results for input(s): PROBNP in the last 8760 hours.  CBG:  Recent  Labs Lab 12/14/16 0736 12/15/16 0534  GLUCAP 130* 125*    Recent Results (from the past 240 hour(s))  Culture, sputum-assessment     Status: None   Collection Time: 12/13/16  7:55 PM  Result Value Ref Range Status   Specimen Description EXPECTORATED SPUTUM  Final   Special Requests Normal  Final   Sputum evaluation THIS SPECIMEN IS ACCEPTABLE FOR SPUTUM CULTURE  Final   Report Status 12/15/2016 FINAL  Final  Culture, respiratory (NON-Expectorated)     Status: None (Preliminary result)   Collection Time: 12/13/16  7:55 PM  Result Value Ref Range Status   Specimen Description EXPECTORATED SPUTUM  Final   Special Requests Normal Reflexed from [id number]  Final   Gram Stain   Final    FEW WBC PRESENT, PREDOMINANTLY MONONUCLEAR FEW GRAM POSITIVE COCCI IN PAIRS IN CHAINS    Culture PENDING  Incomplete   Report Status PENDING  Incomplete  MRSA PCR Screening     Status: None   Collection Time: 12/13/16  9:31 PM  Result Value Ref Range Status   MRSA by PCR NEGATIVE NEGATIVE Final    Comment:        The GeneXpert MRSA Assay (FDA approved for NASAL specimens only), is one component of a comprehensive MRSA colonization surveillance program. It is not intended to diagnose MRSA infection nor to guide or monitor treatment for MRSA infections.      Studies: No results found.  Scheduled Meds: . enoxaparin (LOVENOX) injection  40 mg Subcutaneous Q24H  . feeding supplement (ENSURE ENLIVE)  237 mL Oral BID BM  . guaiFENesin  600 mg Oral BID  . levofloxacin (LEVAQUIN) IV  500 mg Intravenous Q24H  . mouth rinse  15 mL Mouth Rinse BID  . methylPREDNISolone (SOLU-MEDROL) injection  60 mg Intravenous Q6H  . mometasone-formoterol  2 puff Inhalation BID  . nicotine  14 mg Transdermal Daily    Continuous Infusions: . albuterol Stopped (12/13/16 1846)     Time spent: 50mins  Richie Bonanno MD, PhD  [facility] Pager [phone]. If 7PM-7AM, please contact night-coverage at  [email or web address], password [email or web address] 12/15/2016, 8:29 AM  LOS: 2 days

## 2016-12-15 NOTE — Progress Notes (Signed)
PRN neb given for Faint Expiratory Wheeze. Pt is stable at this time no acute respiratory distress noted.

## 2016-12-15 NOTE — Progress Notes (Signed)
Report given to [facility] in 6E.

## 2016-12-16 DIAGNOSIS — J069 Acute upper respiratory infection, unspecified: Secondary | ICD-10-CM

## 2016-12-16 DIAGNOSIS — R531 Weakness: Secondary | ICD-10-CM | POA: Diagnosis not present

## 2016-12-16 DIAGNOSIS — Z720 Tobacco use: Secondary | ICD-10-CM | POA: Diagnosis not present

## 2016-12-16 DIAGNOSIS — R911 Solitary pulmonary nodule: Secondary | ICD-10-CM

## 2016-12-16 DIAGNOSIS — F17200 Nicotine dependence, unspecified, uncomplicated: Secondary | ICD-10-CM | POA: Diagnosis not present

## 2016-12-16 DIAGNOSIS — I2510 Atherosclerotic heart disease of native coronary artery without angina pectoris: Secondary | ICD-10-CM | POA: Diagnosis not present

## 2016-12-16 DIAGNOSIS — I2584 Coronary atherosclerosis due to calcified coronary lesion: Secondary | ICD-10-CM | POA: Diagnosis not present

## 2016-12-16 DIAGNOSIS — R1313 Dysphagia, pharyngeal phase: Secondary | ICD-10-CM | POA: Diagnosis not present

## 2016-12-16 DIAGNOSIS — J441 Chronic obstructive pulmonary disease with (acute) exacerbation: Principal | ICD-10-CM

## 2016-12-16 DIAGNOSIS — R2689 Other abnormalities of gait and mobility: Secondary | ICD-10-CM | POA: Diagnosis not present

## 2016-12-16 DIAGNOSIS — B379 Candidiasis, unspecified: Secondary | ICD-10-CM | POA: Diagnosis not present

## 2016-12-16 DIAGNOSIS — J9621 Acute and chronic respiratory failure with hypoxia: Secondary | ICD-10-CM | POA: Diagnosis not present

## 2016-12-16 DIAGNOSIS — K222 Esophageal obstruction: Secondary | ICD-10-CM | POA: Diagnosis not present

## 2016-12-16 DIAGNOSIS — L304 Erythema intertrigo: Secondary | ICD-10-CM | POA: Diagnosis not present

## 2016-12-16 DIAGNOSIS — E785 Hyperlipidemia, unspecified: Secondary | ICD-10-CM | POA: Diagnosis not present

## 2016-12-16 DIAGNOSIS — R1310 Dysphagia, unspecified: Secondary | ICD-10-CM | POA: Diagnosis not present

## 2016-12-16 DIAGNOSIS — R278 Other lack of coordination: Secondary | ICD-10-CM | POA: Diagnosis not present

## 2016-12-16 DIAGNOSIS — J80 Acute respiratory distress syndrome: Secondary | ICD-10-CM | POA: Diagnosis not present

## 2016-12-16 LAB — CBC
HCT: 38.5 % (ref 36.0–46.0)
HEMOGLOBIN: 12.1 g/dL (ref 12.0–15.0)
MCH: 30.6 pg (ref 26.0–34.0)
MCHC: 31.4 g/dL (ref 30.0–36.0)
MCV: 97.5 fL (ref 78.0–100.0)
PLATELETS: 373 10*3/uL (ref 150–400)
RBC: 3.95 MIL/uL (ref 3.87–5.11)
RDW: 14.9 % (ref 11.5–15.5)
WBC: 18.5 10*3/uL — AB (ref 4.0–10.5)

## 2016-12-16 LAB — BASIC METABOLIC PANEL
ANION GAP: 6 (ref 5–15)
BUN: 40 mg/dL — AB (ref 6–20)
CO2: 30 mmol/L (ref 22–32)
Calcium: 8.7 mg/dL — ABNORMAL LOW (ref 8.9–10.3)
Chloride: 104 mmol/L (ref 101–111)
Creatinine, Ser: 0.74 mg/dL (ref 0.44–1.00)
Glucose, Bld: 137 mg/dL — ABNORMAL HIGH (ref 65–99)
POTASSIUM: 4 mmol/L (ref 3.5–5.1)
Sodium: 140 mmol/L (ref 135–145)

## 2016-12-16 LAB — GLUCOSE, CAPILLARY: Glucose-Capillary: 142 mg/dL — ABNORMAL HIGH (ref 65–99)

## 2016-12-16 MED ORDER — GUAIFENESIN ER 600 MG PO TB12: 1200.0000 mg | ORAL_TABLET | Freq: Two times a day (BID) | ORAL | Status: DC

## 2016-12-16 MED ORDER — FLUCONAZOLE 100 MG PO TABS: 100.0000 mg | ORAL_TABLET | Freq: Every day | ORAL | Status: AC

## 2016-12-16 MED ORDER — LEVOFLOXACIN 750 MG PO TABS: 750.0000 mg | ORAL_TABLET | Freq: Every day | ORAL | 0 refills | Status: DC

## 2016-12-16 MED ORDER — PREDNISONE 10 MG (21) PO TBPK: ORAL_TABLET | ORAL | 0 refills | Status: DC

## 2016-12-16 MED ORDER — NYSTATIN 100000 UNIT/GM EX POWD: Freq: Two times a day (BID) | CUTANEOUS | 0 refills | Status: DC

## 2016-12-16 NOTE — Progress Notes (Signed)
Patient transferred to [phone] with all belongings, A/OX4 and in NAD.

## 2016-12-16 NOTE — Clinical Social Work Placement (Addendum)
   CLINICAL SOCIAL WORK PLACEMENT  NOTE 12/16/16 - DISCHARGED TO [name] VIA AMBULANCE  Date:  12/16/2016  Patient Details  Name: Valerie Gay MRN: [id number] Date of Birth: Aug 12, 1941  Clinical Social Work is seeking post-discharge placement for this patient at the [place] level of care (*CSW will initial, date and re-position this form in  chart as items are completed):  Yes   Patient/family provided with [facility] Work Department's list of facilities offering this level of care within the geographic area requested by the patient (or if unable, by the patient's family).  Yes   Patient/family informed of their freedom to choose among providers that offer the needed level of care, that participate in Medicare, Medicaid or managed care program needed by the patient, have an available bed and are willing to accept the patient.  Yes   Patient/family informed of [facility]'s ownership interest in [facility] and [facility], as well as of the fact that they are under no obligation to receive care at these facilities.  PASRR submitted to E[employer] on 12/14/16     PASRR number received on 12/14/16     Existing PASRR number confirmed on       FL2 transmitted to all facilities in geographic area requested by pt/family on 12/14/16     FL2 transmitted to all facilities within larger geographic area on       Patient informed that his/her managed care company has contracts with or will negotiate with certain facilities, including the following:         Yes - Patient/family informed of bed offers received.  Patient chooses bed at  [facility]     Physician recommends and patient chooses bed at      Patient to be transferred to  [facility] on  12/16/16.  Patient to be transferred to facility by  Ambulance     Patient/family notified on  12/16/16 of transfer.  Name of family member notified:   Daughter [name]      PHYSICIAN Please sign FL2     Additional Comment:    _______________________________________________ Sable Feil, LCSW 12/16/2016, 12:10 PM

## 2016-12-16 NOTE — Discharge Summary (Signed)
Physician Discharge Summary  Valerie Gay [id number] DOB: [birth year] DOA: 12/13/2016  PCP: No PCP Per Patient  Admit date: 12/13/2016 Discharge date: 12/16/2016  Admitted From: Home Disposition: SNF  Recommendations for Outpatient Follow-up:  1. Follow up with PCP in 1-2 weeks 2. Please obtain BMP/CBC in one week 3. Levofloxacin for 5 more days.  Home Health: NA Equipment/Devices:NA  Discharge Condition: Stable CODE STATUS: Full Code Diet recommendation: Diet regular Room service appropriate? Yes; Fluid consistency: Thin Diet - low sodium heart healthy  Brief/Interim Summary: Valerie Gay is a 76 y.o. female with medical history significant for tobacco abuse and COPD with 1-2 L/m supplemental oxygen requirement at baseline, now presenting to the emergency department with progressive dyspnea and productive cough. Patient reports that she had been in her usual state of health until the development of rhinorrhea and sore throat the evening of 12/10/2016. By the following day, the patient had developed a productive cough, wheezing, and dyspnea. She was also experiencing general malaise and diffuse body aches at that time. Patient had some prednisone and doxycycline at home and took 40 mg prednisone on 12/11/2016 and 30 mg the subsequent 2 days. She reports taking one dose of 100 mg doxycycline PTA.  Discharge Diagnoses:  Principal Problem:   COPD exacerbation ([place]) Active Problems:   TOBACCO USE   Acute on chronic respiratory failure (HCC)   Lung nodule   Coronary artery calcification   Acute URI   COPD with acute exacerbation ([place])   1. COPD with acute exacerbation - Symptoms began 1/24 with rhinorrhea, sore throat, and malaise; this was followed shortly by progressive dyspnea and wheezing with productive cough  - CXR did not show pneumonia, and some leukocytosis. - Negative influenza PCR screen. - Treated with levofloxacin and initially Tamiflu but that was  discontinued after the influenza screen was negative. - Discharged to the nursing home with Levaquin for 5 more days, prednisone taper and Mucinex.  2. Acute URI  - Pt reports rhinorrhea, sore throat, and malaise starting evening of 1/24 - She has had family contacts with similar illness  - Afebrile on presentation  -  influenza PCR negative,; empirically started Tamiflu has stopped, continue levofloxacin for 5 more days.   3. Tobacco abuse  - Counseled toward cessation; pt not ready to consider quitting, but has reportedly been "cutting back" for years  - Nicotine patch offered  - RN asked to provide smoking-cessation information prior to discharge.  4. Inframammary intertriginous skin breakdown with fungal overgrowth under both breasts -Wound care input appreciated -Antimicrobial wicking fabric to manage intertriginous skin damage from moisture -Topical nystatin and oral Diflucan, patient also on antibiotics in the hospital which doesn't help with fungal infections.  Discharge Instructions  Discharge Instructions    Diet - low sodium heart healthy    Complete by:  As directed    Increase activity slowly    Complete by:  As directed      Allergies as of 12/16/2016      Reactions   Spiriva Handihaler [tiotropium Bromide Monohydrate] Hives, Itching, Rash    on upper body   Banana Itching      Medication List    STOP taking these medications   doxycycline 100 MG tablet Commonly known as:  VIBRA-TABS   predniSONE 10 MG tablet Commonly known as:  DELTASONE Replaced by:  predniSONE 10 MG (21) Tbpk tablet     TAKE these medications   albuterol 108 (90 Base) MCG/ACT inhaler Commonly known  as:  PROVENTIL HFA;VENTOLIN HFA Inhale 2 puffs into the lungs every 6 (six) hours as needed for wheezing or shortness of breath.   albuterol (2.5 MG/3ML) 0.083% nebulizer solution Commonly known as:  PROVENTIL inhale contents of 1 vial in nebulizer every 8 hours if needed for  wheezing or shortness of breath   aspirin EC 81 MG tablet Take 81 mg by mouth at bedtime.   Coenzyme Q10 100 MG capsule Take 3 capsules (300 mg total) by mouth daily.   fluconazole 100 MG tablet Commonly known as:  DIFLUCAN Take 1 tablet (100 mg total) by mouth daily.   guaiFENesin 600 MG 12 hr tablet Commonly known as:  MUCINEX Take 2 tablets (1,200 mg total) by mouth 2 (two) times daily.   INCRUSE ELLIPTA 62.5 MCG/INH Aepb Generic drug:  umeclidinium bromide USE 1 INHALATION DAILY What changed:  See the new instructions.   levofloxacin 750 MG tablet Commonly known as:  LEVAQUIN Take 1 tablet (750 mg total) by mouth daily.   nystatin powder Commonly known as:  MYCOSTATIN/NYSTOP Apply topically 2 (two) times daily.   OXYGEN Inhale 2 L into the lungs as needed (shortness of breath).   predniSONE 10 MG (21) Tbpk tablet Commonly known as:  STERAPRED UNI-PAK 21 TAB Take 6-5-4-3-2-1 PO orally till gone Replaces:  predniSONE 10 MG tablet   SYMBICORT 160-4.5 MCG/ACT inhaler Generic drug:  budesonide-formoterol USE 2 INHALATIONS TWICE A DAY       Allergies  Allergen Reactions  . Spiriva Handihaler [Tiotropium Bromide Monohydrate] Hives, Itching and Rash     on upper body  . Banana Itching    Consultations:  None   Procedures None  Radiological studies: Dg Chest 2 View  Result Date: 12/13/2016 CLINICAL DATA:  Congestion, shortness of breath, productive cough EXAM: CHEST  2 VIEW COMPARISON:  Chest CT 09/21/2016 FINDINGS: There is hyperinflation of the lungs compatible with COPD. Heart and mediastinal contours are within normal limits. No focal opacities or effusions. No acute bony abnormality. IMPRESSION: COPD.  No active disease. Electronically Signed   By: Rolm Baptise M.D.   On: 12/13/2016 15:42    Subjective:  Discharge Exam: Vitals:   12/15/16 1956 12/15/16 2300 12/16/16 0434 12/16/16 0956  BP:  114/73 (!) 128/47 99/81  Pulse: 81 80 67 78  Resp: (!)  29 19 18 18   Temp: 97.8 F (36.6 C)  97.8 F (36.6 C) 98.6 F (37 C)  TempSrc: Oral  Oral Oral  SpO2: 97% 97% 98%   Weight:   67.6 kg (149 lb)   Height:   5\' 2"  (1.575 m)    General: Pt is alert, awake, not in acute distress Cardiovascular: RRR, S1/S2 +, no rubs, no gallops Respiratory: CTA bilaterally, no wheezing, no rhonchi Abdominal: Soft, NT, ND, bowel sounds + Extremities: no edema, no cyanosis   The results of significant diagnostics from this hospitalization (including imaging, microbiology, ancillary and laboratory) are listed below for reference.    Microbiology: Recent Results (from the past 240 hour(s))  Culture, sputum-assessment     Status: None   Collection Time: 12/13/16  7:55 PM  Result Value Ref Range Status   Specimen Description EXPECTORATED SPUTUM  Final   Special Requests Normal  Final   Sputum evaluation THIS SPECIMEN IS ACCEPTABLE FOR SPUTUM CULTURE  Final   Report Status 12/15/2016 FINAL  Final  Culture, respiratory (NON-Expectorated)     Status: None (Preliminary result)   Collection Time: 12/13/16  7:55 PM  Result  Value Ref Range Status   Specimen Description EXPECTORATED SPUTUM  Final   Special Requests Normal Reflexed from [id number]  Final   Gram Stain   Final    FEW WBC PRESENT, PREDOMINANTLY MONONUCLEAR FEW GRAM POSITIVE COCCI IN PAIRS IN CHAINS    Culture CULTURE REINCUBATED FOR BETTER GROWTH  Final   Report Status PENDING  Incomplete  MRSA PCR Screening     Status: None   Collection Time: 12/13/16  9:31 PM  Result Value Ref Range Status   MRSA by PCR NEGATIVE NEGATIVE Final    Comment:        The GeneXpert MRSA Assay (FDA approved for NASAL specimens only), is one component of a comprehensive MRSA colonization surveillance program. It is not intended to diagnose MRSA infection nor to guide or monitor treatment for MRSA infections.      Labs: BNP (last 3 results) No results for input(s): BNP in the last 8760 hours. Basic  Metabolic Panel:  Recent Labs Lab 12/13/16 1448 12/14/16 0250 12/15/16 0216 12/16/16 0633  NA 138 136 142 140  K 3.9 3.4* 4.5 4.0  CL 101 103 106 104  CO2 24 24 27 30   GLUCOSE 131* 162* 146* 137*  BUN 27* 36* 37* 40*  CREATININE 0.69 0.84 0.66 0.74  CALCIUM 9.6 8.7* 9.3 8.7*  MG  --   --  2.1  --    Liver Function Tests:  Recent Labs Lab 12/13/16 1448  AST 26  ALT 15  ALKPHOS 65  BILITOT 0.4  PROT 7.0  ALBUMIN 3.8   No results for input(s): LIPASE, AMYLASE in the last 168 hours. No results for input(s): AMMONIA in the last 168 hours. CBC:  Recent Labs Lab 12/13/16 1448 12/14/16 0250 12/15/16 0216 12/16/16 0633  WBC 12.8* 14.9* 21.9* 18.5*  NEUTROABS 11.6*  --  20.2*  --   HGB 14.8 13.2 12.3 12.1  HCT 44.8 40.7 38.2 38.5  MCV 95.5 95.8 96.5 97.5  PLT 424* 367 372 373   Cardiac Enzymes: No results for input(s): CKTOTAL, CKMB, CKMBINDEX, TROPONINI in the last 168 hours. BNP: Invalid input(s): POCBNP CBG:  Recent Labs Lab 12/14/16 0736 12/15/16 0534 12/16/16 0750  GLUCAP 130* 125* 142*   D-Dimer No results for input(s): DDIMER in the last 72 hours. Hgb A1c No results for input(s): HGBA1C in the last 72 hours. Lipid Profile No results for input(s): CHOL, HDL, LDLCALC, TRIG, CHOLHDL, LDLDIRECT in the last 72 hours. Thyroid function studies No results for input(s): TSH, T4TOTAL, T3FREE, THYROIDAB in the last 72 hours.  Invalid input(s): FREET3 Anemia work up No results for input(s): VITAMINB12, FOLATE, FERRITIN, TIBC, IRON, RETICCTPCT in the last 72 hours. Urinalysis    Component Value Date/Time   COLORURINE YELLOW 12/13/2016 1944   APPEARANCEUR HAZY (A) 12/13/2016 1944   LABSPEC 1.016 12/13/2016 1944   PHURINE 5.0 12/13/2016 1944   GLUCOSEU 50 (A) 12/13/2016 1944   HGBUR NEGATIVE 12/13/2016 1944   BILIRUBINUR NEGATIVE 12/13/2016 1944   KETONESUR 5 (A) 12/13/2016 1944   PROTEINUR 30 (A) 12/13/2016 1944   UROBILINOGEN 1.0 03/29/2014 1032    NITRITE NEGATIVE 12/13/2016 1944   LEUKOCYTESUR NEGATIVE 12/13/2016 1944   Sepsis Labs Invalid input(s): PROCALCITONIN,  WBC,  LACTICIDVEN Microbiology Recent Results (from the past 240 hour(s))  Culture, sputum-assessment     Status: None   Collection Time: 12/13/16  7:55 PM  Result Value Ref Range Status   Specimen Description EXPECTORATED SPUTUM  Final   Special Requests Normal  Final   Sputum evaluation THIS SPECIMEN IS ACCEPTABLE FOR SPUTUM CULTURE  Final   Report Status 12/15/2016 FINAL  Final  Culture, respiratory (NON-Expectorated)     Status: None (Preliminary result)   Collection Time: 12/13/16  7:55 PM  Result Value Ref Range Status   Specimen Description EXPECTORATED SPUTUM  Final   Special Requests Normal Reflexed from [id number]  Final   Gram Stain   Final    FEW WBC PRESENT, PREDOMINANTLY MONONUCLEAR FEW GRAM POSITIVE COCCI IN PAIRS IN CHAINS    Culture CULTURE REINCUBATED FOR BETTER GROWTH  Final   Report Status PENDING  Incomplete  MRSA PCR Screening     Status: None   Collection Time: 12/13/16  9:31 PM  Result Value Ref Range Status   MRSA by PCR NEGATIVE NEGATIVE Final    Comment:        The GeneXpert MRSA Assay (FDA approved for NASAL specimens only), is one component of a comprehensive MRSA colonization surveillance program. It is not intended to diagnose MRSA infection nor to guide or monitor treatment for MRSA infections.      Time coordinating discharge: Over 30 minutes  SIGNED:   Birdie Hopes, MD  [facility] 12/16/2016, 10:11 AM Pager   If 7PM-7AM, please contact night-coverage [email or web address] Password [email or web address]

## 2016-12-16 NOTE — Care Management Note (Signed)
Case Management Note  Patient Details  Name: Valerie Gay MRN: [id number] Date of Birth: [birth year]  Subjective/Objective:                 Patient admitted with COPD exac. From home w daughter.    Action/Plan:  DC to SNF as facilitated by CSW today.  Expected Discharge Date:  12/16/16               Expected Discharge Plan:  Skilled Nursing Facility  In-House Referral:  Clinical Social Work  Discharge planning Services  CM Consult  Post Acute Care Choice:    Choice offered to:     DME Arranged:    DME Agency:     HH Arranged:    [place] Agency:     Status of Service:  Completed, signed off  If discussed at [employer], dates discussed:    Additional Comments:  Carles Collet, RN 12/16/2016, 12:03 PM

## 2016-12-16 NOTE — Progress Notes (Signed)
Report given to [facility] of [place].

## 2016-12-16 NOTE — Progress Notes (Signed)
Physical Therapy Treatment Patient Details Name: Valerie Gay MRN: [id number] DOB: 01-Jun-1941 Today's Date: 12/16/2016    History of Present Illness 76 y.o. female with above stated PMHx, HPI, and physical. PMHx of COPD (Symbicort, INH, Nebs, 2L [place] PRN). Symptom onset 5 days ago. Rhinorrhea, nasal congestion, nonproductive cough    PT Comments    Progressing well with mobility. Able to manage to bring up sats that drop during ambulation with efficient breathing   Follow Up Recommendations  SNF;Supervision/Assistance - 24 hour     Equipment Recommendations  None recommended by PT    Recommendations for Other Services       Precautions / Restrictions Precautions Precautions: Fall Precaution Comments: watch HR and O2 saturations    Mobility  Bed Mobility Overal bed mobility: Needs Assistance Bed Mobility: Supine to Sit;Sit to Supine     Supine to sit: Supervision Sit to supine: Supervision   General bed mobility comments: Pt has her own way to get in/out of bed to keep her vertigo at bay.  Transfers Overall transfer level: Needs assistance Equipment used: Rolling walker (2 wheeled) Transfers: Sit to/from Stand Sit to Stand: Min guard         General transfer comment: cue so hand placement. guard for safety  Ambulation/Gait Ambulation/Gait assistance: Min guard Ambulation Distance (Feet): 280 Feet (with standing rest at 140 feet) Assistive device: Rolling walker (2 wheeled) Gait Pattern/deviations: Step-through pattern Gait velocity: decreased Gait velocity interpretation: Below normal speed for age/gender General Gait Details: generally steady  with SpO2 dropping to 85% on 3L [place] at 135 bpm  and 87%and 128 bpm on the return.   Stairs            Wheelchair Mobility    Modified Rankin (Stroke Patients Only)       Balance     Sitting balance-Leahy Scale: Good       Standing balance-Leahy Scale: Poor Standing balance comment: reliance on  UE support, some instability noted. Min guard to min assist based upon fatigue level                    Cognition Arousal/Alertness: Awake/alert Behavior During Therapy: Flat affect;WFL for tasks assessed/performed Overall Cognitive Status: No family/caregiver present to determine baseline cognitive functioning                      Exercises      General Comments        Pertinent Vitals/Pain Pain Assessment: No/denies pain    Home Living                      Prior Function            PT Goals (current goals can now be found in the care plan section) Acute Rehab PT Goals Patient Stated Goal: to go home PT Goal Formulation: With patient Time For Goal Achievement: 12/28/16 Potential to Achieve Goals: Good Progress towards PT goals: Progressing toward goals    Frequency    Min 3X/week      PT Plan Current plan remains appropriate    Co-evaluation             End of Session Equipment Utilized During Treatment: Oxygen Activity Tolerance: Patient limited by fatigue Patient left: in bed;with call bell/phone within reach     Time: 1232-1259 PT Time Calculation (min) (ACUTE ONLY): 27 min  Charges:  $Gait Training: 8-22 mins $Therapeutic Activity: 8-22 mins  G CodesTessie Gay Valerie Gay 12/16/2016, 1:23 PM 12/16/2016  Valerie Gay, [place] [phone]  (pager)

## 2016-12-17 DIAGNOSIS — Z720 Tobacco use: Secondary | ICD-10-CM | POA: Diagnosis not present

## 2016-12-17 DIAGNOSIS — J441 Chronic obstructive pulmonary disease with (acute) exacerbation: Secondary | ICD-10-CM | POA: Diagnosis not present

## 2016-12-17 DIAGNOSIS — J069 Acute upper respiratory infection, unspecified: Secondary | ICD-10-CM | POA: Diagnosis not present

## 2016-12-17 DIAGNOSIS — L304 Erythema intertrigo: Secondary | ICD-10-CM | POA: Diagnosis not present

## 2016-12-17 LAB — CULTURE, RESPIRATORY W GRAM STAIN: Special Requests: NORMAL

## 2016-12-17 LAB — CULTURE, RESPIRATORY: CULTURE: NORMAL

## 2016-12-18 DIAGNOSIS — J9621 Acute and chronic respiratory failure with hypoxia: Secondary | ICD-10-CM | POA: Diagnosis not present

## 2016-12-18 DIAGNOSIS — I2510 Atherosclerotic heart disease of native coronary artery without angina pectoris: Secondary | ICD-10-CM | POA: Diagnosis not present

## 2016-12-18 DIAGNOSIS — Z720 Tobacco use: Secondary | ICD-10-CM | POA: Diagnosis not present

## 2016-12-18 DIAGNOSIS — B379 Candidiasis, unspecified: Secondary | ICD-10-CM | POA: Diagnosis not present

## 2016-12-18 DIAGNOSIS — J441 Chronic obstructive pulmonary disease with (acute) exacerbation: Secondary | ICD-10-CM | POA: Diagnosis not present

## 2016-12-28 DIAGNOSIS — L304 Erythema intertrigo: Secondary | ICD-10-CM | POA: Diagnosis not present

## 2016-12-28 DIAGNOSIS — J441 Chronic obstructive pulmonary disease with (acute) exacerbation: Secondary | ICD-10-CM | POA: Diagnosis not present

## 2016-12-28 DIAGNOSIS — J069 Acute upper respiratory infection, unspecified: Secondary | ICD-10-CM | POA: Diagnosis not present

## 2016-12-28 DIAGNOSIS — Z720 Tobacco use: Secondary | ICD-10-CM | POA: Diagnosis not present

## 2016-12-29 ENCOUNTER — Other Ambulatory Visit (HOSPITAL_COMMUNITY): Payer: Self-pay | Admitting: Physician Assistant

## 2016-12-29 DIAGNOSIS — K222 Esophageal obstruction: Secondary | ICD-10-CM

## 2017-01-01 ENCOUNTER — Other Ambulatory Visit: Payer: Self-pay | Admitting: *Deleted

## 2017-01-01 NOTE — Patient Outreach (Signed)
[facility]) Care Management  01/01/2017  DIAMON REDDINGER Jan 31, 1941 [id number]   Met with patient at bedside at facility.  Patient confirms she sees a provider at [facility].  Patient wears oxygen, 2lpm round the clock, has hx of COPD. Patient reports her daughter assists her with some things but she is mostly independent with driving, medication management and self care.   RNCM reviewed [facility] care management services and patient eligibility. Patient does not wish to sign up at this time but did accept a [facility] brochure and magnet.   SW, Lowella Fairy states patient will go home 01/06/17 with home care.  Plan to sign off. Royetta Crochet. Laymond Purser, RN, BSN, [place] [phone]) Business Cell  [phone]) Toll Free Office

## 2017-01-05 ENCOUNTER — Ambulatory Visit (HOSPITAL_COMMUNITY): Payer: Medicare Other

## 2017-01-05 ENCOUNTER — Ambulatory Visit (HOSPITAL_COMMUNITY)
Admission: RE | Admit: 2017-01-05 | Discharge: 2017-01-05 | Disposition: A | Discharge status: Home / Self Care | Payer: Medicare Other | Source: Ambulatory Visit | Attending: Physician Assistant | Admitting: Physician Assistant

## 2017-01-05 DIAGNOSIS — R1310 Dysphagia, unspecified: Secondary | ICD-10-CM | POA: Diagnosis not present

## 2017-01-05 DIAGNOSIS — K222 Esophageal obstruction: Secondary | ICD-10-CM | POA: Diagnosis not present

## 2017-01-07 DIAGNOSIS — J441 Chronic obstructive pulmonary disease with (acute) exacerbation: Secondary | ICD-10-CM | POA: Diagnosis not present

## 2017-01-07 DIAGNOSIS — R911 Solitary pulmonary nodule: Secondary | ICD-10-CM | POA: Diagnosis not present

## 2017-01-07 DIAGNOSIS — J9611 Chronic respiratory failure with hypoxia: Secondary | ICD-10-CM | POA: Diagnosis not present

## 2017-01-07 DIAGNOSIS — Z7982 Long term (current) use of aspirin: Secondary | ICD-10-CM | POA: Diagnosis not present

## 2017-01-07 DIAGNOSIS — Z9981 Dependence on supplemental oxygen: Secondary | ICD-10-CM | POA: Diagnosis not present

## 2017-01-07 DIAGNOSIS — Z720 Tobacco use: Secondary | ICD-10-CM | POA: Diagnosis not present

## 2017-01-07 DIAGNOSIS — I2510 Atherosclerotic heart disease of native coronary artery without angina pectoris: Secondary | ICD-10-CM | POA: Diagnosis not present

## 2017-01-07 DIAGNOSIS — E785 Hyperlipidemia, unspecified: Secondary | ICD-10-CM | POA: Diagnosis not present

## 2017-01-07 DIAGNOSIS — R1313 Dysphagia, pharyngeal phase: Secondary | ICD-10-CM | POA: Diagnosis not present

## 2017-01-07 DIAGNOSIS — Z9181 History of falling: Secondary | ICD-10-CM | POA: Diagnosis not present

## 2017-01-07 DIAGNOSIS — Z7951 Long term (current) use of inhaled steroids: Secondary | ICD-10-CM | POA: Diagnosis not present

## 2017-01-08 DIAGNOSIS — I2510 Atherosclerotic heart disease of native coronary artery without angina pectoris: Secondary | ICD-10-CM | POA: Diagnosis not present

## 2017-01-08 DIAGNOSIS — R911 Solitary pulmonary nodule: Secondary | ICD-10-CM | POA: Diagnosis not present

## 2017-01-08 DIAGNOSIS — R1313 Dysphagia, pharyngeal phase: Secondary | ICD-10-CM | POA: Diagnosis not present

## 2017-01-08 DIAGNOSIS — J441 Chronic obstructive pulmonary disease with (acute) exacerbation: Secondary | ICD-10-CM | POA: Diagnosis not present

## 2017-01-08 DIAGNOSIS — J9611 Chronic respiratory failure with hypoxia: Secondary | ICD-10-CM | POA: Diagnosis not present

## 2017-01-08 DIAGNOSIS — E785 Hyperlipidemia, unspecified: Secondary | ICD-10-CM | POA: Diagnosis not present

## 2017-01-13 DIAGNOSIS — J9611 Chronic respiratory failure with hypoxia: Secondary | ICD-10-CM | POA: Diagnosis not present

## 2017-01-13 DIAGNOSIS — I2510 Atherosclerotic heart disease of native coronary artery without angina pectoris: Secondary | ICD-10-CM | POA: Diagnosis not present

## 2017-01-13 DIAGNOSIS — R1313 Dysphagia, pharyngeal phase: Secondary | ICD-10-CM | POA: Diagnosis not present

## 2017-01-13 DIAGNOSIS — J441 Chronic obstructive pulmonary disease with (acute) exacerbation: Secondary | ICD-10-CM | POA: Diagnosis not present

## 2017-01-13 DIAGNOSIS — E785 Hyperlipidemia, unspecified: Secondary | ICD-10-CM | POA: Diagnosis not present

## 2017-01-13 DIAGNOSIS — R911 Solitary pulmonary nodule: Secondary | ICD-10-CM | POA: Diagnosis not present

## 2017-01-15 DIAGNOSIS — E785 Hyperlipidemia, unspecified: Secondary | ICD-10-CM | POA: Diagnosis not present

## 2017-01-15 DIAGNOSIS — J9611 Chronic respiratory failure with hypoxia: Secondary | ICD-10-CM | POA: Diagnosis not present

## 2017-01-15 DIAGNOSIS — J441 Chronic obstructive pulmonary disease with (acute) exacerbation: Secondary | ICD-10-CM | POA: Diagnosis not present

## 2017-01-15 DIAGNOSIS — I2510 Atherosclerotic heart disease of native coronary artery without angina pectoris: Secondary | ICD-10-CM | POA: Diagnosis not present

## 2017-01-15 DIAGNOSIS — R911 Solitary pulmonary nodule: Secondary | ICD-10-CM | POA: Diagnosis not present

## 2017-01-15 DIAGNOSIS — R1313 Dysphagia, pharyngeal phase: Secondary | ICD-10-CM | POA: Diagnosis not present

## 2017-01-20 DIAGNOSIS — J441 Chronic obstructive pulmonary disease with (acute) exacerbation: Secondary | ICD-10-CM | POA: Diagnosis not present

## 2017-01-20 DIAGNOSIS — R911 Solitary pulmonary nodule: Secondary | ICD-10-CM | POA: Diagnosis not present

## 2017-01-20 DIAGNOSIS — R1313 Dysphagia, pharyngeal phase: Secondary | ICD-10-CM | POA: Diagnosis not present

## 2017-01-20 DIAGNOSIS — J9611 Chronic respiratory failure with hypoxia: Secondary | ICD-10-CM | POA: Diagnosis not present

## 2017-01-20 DIAGNOSIS — E785 Hyperlipidemia, unspecified: Secondary | ICD-10-CM | POA: Diagnosis not present

## 2017-01-20 DIAGNOSIS — I2510 Atherosclerotic heart disease of native coronary artery without angina pectoris: Secondary | ICD-10-CM | POA: Diagnosis not present

## 2017-01-25 DIAGNOSIS — R911 Solitary pulmonary nodule: Secondary | ICD-10-CM | POA: Diagnosis not present

## 2017-01-25 DIAGNOSIS — J9611 Chronic respiratory failure with hypoxia: Secondary | ICD-10-CM | POA: Diagnosis not present

## 2017-01-25 DIAGNOSIS — J441 Chronic obstructive pulmonary disease with (acute) exacerbation: Secondary | ICD-10-CM | POA: Diagnosis not present

## 2017-01-25 DIAGNOSIS — E785 Hyperlipidemia, unspecified: Secondary | ICD-10-CM | POA: Diagnosis not present

## 2017-01-25 DIAGNOSIS — I2510 Atherosclerotic heart disease of native coronary artery without angina pectoris: Secondary | ICD-10-CM | POA: Diagnosis not present

## 2017-01-25 DIAGNOSIS — R1313 Dysphagia, pharyngeal phase: Secondary | ICD-10-CM | POA: Diagnosis not present

## 2017-01-26 DIAGNOSIS — R198 Other specified symptoms and signs involving the digestive system and abdomen: Secondary | ICD-10-CM | POA: Diagnosis not present

## 2017-01-26 DIAGNOSIS — J449 Chronic obstructive pulmonary disease, unspecified: Secondary | ICD-10-CM | POA: Diagnosis not present

## 2017-01-26 DIAGNOSIS — F17200 Nicotine dependence, unspecified, uncomplicated: Secondary | ICD-10-CM | POA: Diagnosis not present

## 2017-01-28 ENCOUNTER — Encounter: Payer: Self-pay | Admitting: Physician Assistant

## 2017-01-28 DIAGNOSIS — J441 Chronic obstructive pulmonary disease with (acute) exacerbation: Secondary | ICD-10-CM | POA: Diagnosis not present

## 2017-01-28 DIAGNOSIS — R1313 Dysphagia, pharyngeal phase: Secondary | ICD-10-CM | POA: Diagnosis not present

## 2017-01-28 DIAGNOSIS — J9611 Chronic respiratory failure with hypoxia: Secondary | ICD-10-CM | POA: Diagnosis not present

## 2017-01-28 DIAGNOSIS — I2510 Atherosclerotic heart disease of native coronary artery without angina pectoris: Secondary | ICD-10-CM | POA: Diagnosis not present

## 2017-01-28 DIAGNOSIS — E785 Hyperlipidemia, unspecified: Secondary | ICD-10-CM | POA: Diagnosis not present

## 2017-01-28 DIAGNOSIS — R911 Solitary pulmonary nodule: Secondary | ICD-10-CM | POA: Diagnosis not present

## 2017-01-29 DIAGNOSIS — J441 Chronic obstructive pulmonary disease with (acute) exacerbation: Secondary | ICD-10-CM | POA: Diagnosis not present

## 2017-01-29 DIAGNOSIS — J9611 Chronic respiratory failure with hypoxia: Secondary | ICD-10-CM | POA: Diagnosis not present

## 2017-01-29 DIAGNOSIS — R911 Solitary pulmonary nodule: Secondary | ICD-10-CM | POA: Diagnosis not present

## 2017-01-29 DIAGNOSIS — I2510 Atherosclerotic heart disease of native coronary artery without angina pectoris: Secondary | ICD-10-CM | POA: Diagnosis not present

## 2017-01-29 DIAGNOSIS — R1313 Dysphagia, pharyngeal phase: Secondary | ICD-10-CM | POA: Diagnosis not present

## 2017-01-29 DIAGNOSIS — E785 Hyperlipidemia, unspecified: Secondary | ICD-10-CM | POA: Diagnosis not present

## 2017-02-03 ENCOUNTER — Ambulatory Visit (INDEPENDENT_AMBULATORY_CARE_PROVIDER_SITE_OTHER): Payer: Medicare Other | Admitting: Physician Assistant

## 2017-02-03 ENCOUNTER — Encounter (INDEPENDENT_AMBULATORY_CARE_PROVIDER_SITE_OTHER): Payer: Self-pay

## 2017-02-03 ENCOUNTER — Encounter: Payer: Self-pay | Admitting: Physician Assistant

## 2017-02-03 VITALS — BP 120/60 | HR 90 | Ht 62.5 in | Wt 147.0 lb

## 2017-02-03 DIAGNOSIS — R1310 Dysphagia, unspecified: Secondary | ICD-10-CM

## 2017-02-03 DIAGNOSIS — R933 Abnormal findings on diagnostic imaging of other parts of digestive tract: Secondary | ICD-10-CM

## 2017-02-03 DIAGNOSIS — Z86010 Personal history of colonic polyps: Secondary | ICD-10-CM

## 2017-02-03 MED ORDER — NA SULFATE-K SULFATE-MG SULF 17.5-3.13-1.6 GM/177ML PO SOLN: 1.0000 | Freq: Once | ORAL | 0 refills | Status: AC

## 2017-02-03 NOTE — Patient Instructions (Signed)

## 2017-02-03 NOTE — Progress Notes (Signed)
 Subjective:    Patient ID: Valerie Gay, female    DOB: 04/01/1941, 75 y.o.   MRN: [id number]  HPI Valerie Gay is a pleasant 75-year-old white female, referred by Courtney Wharton PA-C for evaluation of dysphagia. She is previously known to Dr. Kaplan and was last seen here in 2010. She has history of COPD, community-acquired pneumonia and had a recent hospitalization for COPD exacerbation in February 2018. Also with history of cholecystitis and colon polyps. Last colonoscopy was done in February 2010 finding of moderate diverticulosis in the left colon and a 2 mm polyp was removed which was a tubular adenoma. Patient says she had mention difficulty swallowing while she was hospitalized and underwent a barium swallow on 01/05/2017 which shows a smooth narrowing in the distal esophagus without definite stricture or mass however a 13 mm pill would not pass through this area. She had a no evidence of hiatal hernia and normal motility. Patient says she has had difficulty swallowing for many many years has never had heartburn or indigestion. He says she wasn't aware that anything can be done about her issues with swallowing and has adjusted her diet over the years to accommodate. She says she avoids steak and red meats most of the time and has to be very careful about the size of pills that she swallows. When she has had episodes of dysphagia in the past these have been associated with discomfort and bringing up of food and a lot of phlegm. She has no current lower GI complaints. Specifically no complaints of abdominal pain and rectal discomfort or bleeding. She feels that her breathing is about back to her baseline since hospitalization. She uses oxygen at home on an as-needed basis.  Review of Systems Pertinent positive and negative review of systems were noted in the above HPI section.  All other review of systems was otherwise negative.  Outpatient Encounter Prescriptions as of 02/03/2017  Medication  Sig  . albuterol (PROVENTIL HFA;VENTOLIN HFA) 108 (90 BASE) MCG/ACT inhaler Inhale 2 puffs into the lungs every 6 (six) hours as needed for wheezing or shortness of breath.  . albuterol (PROVENTIL) (2.5 MG/3ML) 0.083% nebulizer solution inhale contents of 1 vial in nebulizer every 8 hours if needed for wheezing or shortness of breath  . aspirin EC 81 MG tablet Take 81 mg by mouth at bedtime.  . INCRUSE ELLIPTA 62.5 MCG/INH AEPB USE 1 INHALATION DAILY (Patient taking differently: USE 1 INHALATION DAILY AT 3PM)  . nystatin (MYCOSTATIN/NYSTOP) powder Apply topically 2 (two) times daily.  . OXYGEN Inhale 2 L into the lungs as needed (shortness of breath).  . SYMBICORT 160-4.5 MCG/ACT inhaler USE 2 INHALATIONS TWICE A DAY  . [DISCONTINUED] levofloxacin (LEVAQUIN) 750 MG tablet Take 1 tablet (750 mg total) by mouth daily.  . Na Sulfate-K Sulfate-Mg Sulf 17.5-3.13-1.6 GM/180ML SOLN Take 1 kit by mouth once.  . [DISCONTINUED] Coenzyme Q10 100 MG capsule Take 3 capsules (300 mg total) by mouth daily. (Patient not taking: Reported on 12/13/2016)  . [DISCONTINUED] guaiFENesin (MUCINEX) 600 MG 12 hr tablet Take 2 tablets (1,200 mg total) by mouth 2 (two) times daily.  . [DISCONTINUED] predniSONE (STERAPRED UNI-PAK 21 TAB) 10 MG (21) TBPK tablet Take 6-5-4-3-2-1 PO orally till gone   No facility-administered encounter medications on file as of 02/03/2017.    Allergies  Allergen Reactions  . Spiriva Handihaler [Tiotropium Bromide Monohydrate] Hives, Itching and Rash     on upper body  . Banana Itching   Patient Active   Problem List   Diagnosis Date Noted  . Acute URI 12/13/2016  . COPD with acute exacerbation (HCC) 12/13/2016  . Coronary artery calcification 10/23/2016  . Hyperlipidemia LDL goal <100 10/23/2016  . Medication management 10/23/2016  . DOE (dyspnea on exertion) 10/23/2016  . Chest pain with moderate risk for cardiac etiology 10/23/2016  . Lung nodule 02/26/2015  . Nodule of right lung  11/28/2014  . Acute calculous cholecystitis 06/22/2014  . Acute cholecystitis 06/22/2014  . Pre-operative respiratory examination 06/22/2014  . Abnormal chest x-ray 05/28/2014  . COPD, moderate (HCC) 05/07/2014  . Acute on chronic respiratory failure (HCC) 03/27/2014  . COPD exacerbation (HCC) 03/27/2014  . CAP (community acquired pneumonia) 03/27/2014  . Nicotine abuse 03/27/2014  . Hypokalemia 03/27/2014  . Leukocytosis 03/27/2014  . TOBACCO USE 11/05/2008  . COPD 11/05/2008  . HYPERGLYCEMIA 11/05/2008  . ANEMIA-NOS 07/21/2007   Social History   Social History  . Marital status: Divorced    Spouse name: N/A  . Number of children: N/A  . Years of education: N/A   Occupational History  . retired    Social History Main Topics  . Smoking status: Current Every Day Smoker    Packs/day: 0.50    Years: 60.00    Types: Cigarettes  . Smokeless tobacco: Current User     Comment: 1/2 ppd 11.20.17  . Alcohol use No  . Drug use: No  . Sexual activity: No   Other Topics Concern  . Not on file   Social History Narrative  . No narrative on file    Ms. Yapp's family history includes Alzheimer's disease in her mother; Cancer in her father; Diabetes in her mother.      Objective:    Vitals:   02/03/17 1020  BP: 120/60  Pulse: 90    Physical Exam   well-developed  Older white female in no acute distress, pleasant blood pressure 120/60 pulse 90, Height 5 foot 2, weight 147, BMI 26.4. HEENT; nontraumatic normocephalic EOMI PERRLA sclera anicteric, Cardiovascular; regular rate and rhythm with S1-S2 no murmur or gallop, Pulmonary ;clear bilaterally, Abdomen; soft nontender, nondistended bowel sounds are active there is no palpable mass or hepatosplenomegaly, Rectal; exam not done, Extremities; no clubbing cyanosis or edema skin warm and dry, Neuropsych ;mood and affect appropriate       Assessment & Plan:   #1 75-year-old white female with long-term solid food dysphagia,  recent abnormal barium swallow with smooth narrowing at the distal esophagus and normal esophageal motility. Rule out distal esophageal ring or stricture #2 history of adenomatous colon polyps, last colonoscopy February 2010, overdue for follow-up #3 COPD with intermittent oxygen use #4 status post cholecystectomy   #5 diverticulosis  Plan; Patient will be scheduled for EGD with probable esophageal dilation and colonoscopy with Dr. Armbruster. Both procedures were discussed in detail with patient including risks and benefits and she is agreeable to proceed. Procedures will be scheduled at [facility] given intermittent oxygen use, and have been scheduled for 02/19/2017.    Ann Bohne S Traeson Dusza PA-C 02/03/2017   Cc: Wharton, Courtney, PA-C  

## 2017-02-04 NOTE — Progress Notes (Signed)
Agree with assessment and plan as outlined.  

## 2017-02-16 DIAGNOSIS — I2510 Atherosclerotic heart disease of native coronary artery without angina pectoris: Secondary | ICD-10-CM | POA: Diagnosis not present

## 2017-02-16 DIAGNOSIS — R911 Solitary pulmonary nodule: Secondary | ICD-10-CM | POA: Diagnosis not present

## 2017-02-16 DIAGNOSIS — R1313 Dysphagia, pharyngeal phase: Secondary | ICD-10-CM | POA: Diagnosis not present

## 2017-02-16 DIAGNOSIS — E785 Hyperlipidemia, unspecified: Secondary | ICD-10-CM | POA: Diagnosis not present

## 2017-02-16 DIAGNOSIS — J441 Chronic obstructive pulmonary disease with (acute) exacerbation: Secondary | ICD-10-CM | POA: Diagnosis not present

## 2017-02-16 DIAGNOSIS — J9611 Chronic respiratory failure with hypoxia: Secondary | ICD-10-CM | POA: Diagnosis not present

## 2017-02-17 ENCOUNTER — Encounter (HOSPITAL_COMMUNITY): Payer: Self-pay | Admitting: *Deleted

## 2017-02-19 ENCOUNTER — Encounter (HOSPITAL_COMMUNITY): Payer: Self-pay | Admitting: *Deleted

## 2017-02-19 ENCOUNTER — Ambulatory Visit (HOSPITAL_COMMUNITY): Payer: Medicare Other | Admitting: Anesthesiology

## 2017-02-19 ENCOUNTER — Ambulatory Visit (HOSPITAL_COMMUNITY)
Admission: RE | Admit: 2017-02-19 | Discharge: 2017-02-19 | Disposition: A | Discharge status: Home / Self Care | Payer: Medicare Other | Source: Ambulatory Visit | Attending: Gastroenterology | Admitting: Gastroenterology

## 2017-02-19 ENCOUNTER — Encounter (HOSPITAL_COMMUNITY)
Admission: RE | Disposition: A | Discharge status: Home / Self Care | Payer: Self-pay | Source: Ambulatory Visit | Attending: Gastroenterology

## 2017-02-19 DIAGNOSIS — D122 Benign neoplasm of ascending colon: Secondary | ICD-10-CM | POA: Diagnosis not present

## 2017-02-19 DIAGNOSIS — K222 Esophageal obstruction: Secondary | ICD-10-CM | POA: Diagnosis not present

## 2017-02-19 DIAGNOSIS — K5730 Diverticulosis of large intestine without perforation or abscess without bleeding: Secondary | ICD-10-CM | POA: Diagnosis not present

## 2017-02-19 DIAGNOSIS — Z888 Allergy status to other drugs, medicaments and biological substances status: Secondary | ICD-10-CM | POA: Diagnosis not present

## 2017-02-19 DIAGNOSIS — F17210 Nicotine dependence, cigarettes, uncomplicated: Secondary | ICD-10-CM | POA: Insufficient documentation

## 2017-02-19 DIAGNOSIS — K644 Residual hemorrhoidal skin tags: Secondary | ICD-10-CM | POA: Diagnosis not present

## 2017-02-19 DIAGNOSIS — J449 Chronic obstructive pulmonary disease, unspecified: Secondary | ICD-10-CM | POA: Insufficient documentation

## 2017-02-19 DIAGNOSIS — Z9981 Dependence on supplemental oxygen: Secondary | ICD-10-CM | POA: Insufficient documentation

## 2017-02-19 DIAGNOSIS — D120 Benign neoplasm of cecum: Secondary | ICD-10-CM

## 2017-02-19 DIAGNOSIS — Z7982 Long term (current) use of aspirin: Secondary | ICD-10-CM | POA: Insufficient documentation

## 2017-02-19 DIAGNOSIS — K449 Diaphragmatic hernia without obstruction or gangrene: Secondary | ICD-10-CM | POA: Insufficient documentation

## 2017-02-19 DIAGNOSIS — K635 Polyp of colon: Secondary | ICD-10-CM

## 2017-02-19 DIAGNOSIS — Z8701 Personal history of pneumonia (recurrent): Secondary | ICD-10-CM | POA: Insufficient documentation

## 2017-02-19 DIAGNOSIS — Z860100 Personal history of colon polyps, unspecified: Secondary | ICD-10-CM

## 2017-02-19 DIAGNOSIS — R933 Abnormal findings on diagnostic imaging of other parts of digestive tract: Secondary | ICD-10-CM

## 2017-02-19 DIAGNOSIS — I2510 Atherosclerotic heart disease of native coronary artery without angina pectoris: Secondary | ICD-10-CM | POA: Diagnosis not present

## 2017-02-19 DIAGNOSIS — Z7951 Long term (current) use of inhaled steroids: Secondary | ICD-10-CM | POA: Diagnosis not present

## 2017-02-19 DIAGNOSIS — Z79899 Other long term (current) drug therapy: Secondary | ICD-10-CM | POA: Insufficient documentation

## 2017-02-19 DIAGNOSIS — Z91018 Allergy to other foods: Secondary | ICD-10-CM | POA: Diagnosis not present

## 2017-02-19 DIAGNOSIS — D123 Benign neoplasm of transverse colon: Secondary | ICD-10-CM

## 2017-02-19 DIAGNOSIS — R1310 Dysphagia, unspecified: Secondary | ICD-10-CM | POA: Diagnosis not present

## 2017-02-19 DIAGNOSIS — Z1211 Encounter for screening for malignant neoplasm of colon: Secondary | ICD-10-CM | POA: Insufficient documentation

## 2017-02-19 DIAGNOSIS — Z9049 Acquired absence of other specified parts of digestive tract: Secondary | ICD-10-CM | POA: Diagnosis not present

## 2017-02-19 DIAGNOSIS — K648 Other hemorrhoids: Secondary | ICD-10-CM | POA: Insufficient documentation

## 2017-02-19 HISTORY — PX: COLONOSCOPY: SHX5424

## 2017-02-19 HISTORY — DX: Anemia, unspecified: D64.9

## 2017-02-19 HISTORY — PX: ESOPHAGOGASTRODUODENOSCOPY: SHX5428

## 2017-02-19 SURGERY — COLONOSCOPY
Anesthesia: Monitor Anesthesia Care

## 2017-02-19 MED ORDER — SODIUM CHLORIDE 0.9 % IV SOLN: INTRAVENOUS | Status: DC

## 2017-02-19 MED ORDER — PROPOFOL 10 MG/ML IV BOLUS
INTRAVENOUS | Status: AC
  Filled 2017-02-19: qty 40

## 2017-02-19 MED ORDER — LACTATED RINGERS IV SOLN
INTRAVENOUS | Status: DC
  Administered 2017-02-19: 11:00:00 via INTRAVENOUS

## 2017-02-19 MED ORDER — PROPOFOL 10 MG/ML IV BOLUS
INTRAVENOUS | Status: DC | PRN
  Administered 2017-02-19: 40 mg via INTRAVENOUS
  Administered 2017-02-19 (×7): 20 mg via INTRAVENOUS
  Administered 2017-02-19: 10 mg via INTRAVENOUS
  Administered 2017-02-19 (×3): 20 mg via INTRAVENOUS
  Administered 2017-02-19: 50 mg via INTRAVENOUS
  Administered 2017-02-19: 20 mg via INTRAVENOUS

## 2017-02-19 MED ORDER — LIDOCAINE 2% (20 MG/ML) 5 ML SYRINGE
INTRAMUSCULAR | Status: DC | PRN
  Administered 2017-02-19: 100 mg via INTRAVENOUS

## 2017-02-19 NOTE — Interval H&P Note (Signed)
History and Physical Interval Note:  02/19/2017 11:10 AM  Valerie Gay  has presented today for surgery, with the diagnosis of dysphagia; hx colon polyps; abnormal barium swallow  The various methods of treatment have been discussed with the patient and family. After consideration of risks, benefits and other options for treatment, the patient has consented to  Procedure(s): COLONOSCOPY (N/A) ESOPHAGOGASTRODUODENOSCOPY (EGD) (N/A) as a surgical intervention .  The patient's history has been reviewed, patient examined, no change in status, stable for surgery.  I have reviewed the patient's chart and labs.  Questions were answered to the patient's satisfaction.     Valerie Gay

## 2017-02-19 NOTE — H&P (View-Only) (Signed)
Subjective:    Patient ID: Valerie Gay, female    DOB: [birth year], 76 y.o.   MRN: [id number]  HPI [name] is a pleasant 76 year old white female, referred by Marda Stalker PA-C for evaluation of dysphagia. She is previously known to Dr. Deatra Ina and was last seen here in 2010. She has history of COPD, community-acquired pneumonia and had a recent hospitalization for COPD exacerbation in February 2018. Also with history of cholecystitis and colon polyps. Last colonoscopy was done in February 2010 finding of moderate diverticulosis in the left colon and a 2 mm polyp was removed which was a tubular adenoma. Patient says she had mention difficulty swallowing while she was hospitalized and underwent a barium swallow on 01/05/2017 which shows a smooth narrowing in the distal esophagus without definite stricture or mass however a 13 mm pill would not pass through this area. She had a no evidence of hiatal hernia and normal motility. Patient says she has had difficulty swallowing for many many years has never had heartburn or indigestion. He says she wasn't aware that anything can be done about her issues with swallowing and has adjusted her diet over the years to accommodate. She says she avoids steak and red meats most of the time and has to be very careful about the size of pills that she swallows. When she has had episodes of dysphagia in the past these have been associated with discomfort and bringing up of food and a lot of phlegm. She has no current lower GI complaints. Specifically no complaints of abdominal pain and rectal discomfort or bleeding. She feels that her breathing is about back to her baseline since hospitalization. She uses oxygen at home on an as-needed basis.  Review of Systems Pertinent positive and negative review of systems were noted in the above HPI section.  All other review of systems was otherwise negative.  Outpatient Encounter Prescriptions as of 02/03/2017  Medication  Sig  . albuterol (PROVENTIL HFA;VENTOLIN HFA) 108 (90 BASE) MCG/ACT inhaler Inhale 2 puffs into the lungs every 6 (six) hours as needed for wheezing or shortness of breath.  [name] albuterol (PROVENTIL) (2.5 MG/3ML) 0.083% nebulizer solution inhale contents of 1 vial in nebulizer every 8 hours if needed for wheezing or shortness of breath  . aspirin EC 81 MG tablet Take 81 mg by mouth at bedtime.  . INCRUSE ELLIPTA 62.5 MCG/INH AEPB USE 1 INHALATION DAILY (Patient taking differently: USE 1 INHALATION DAILY AT 3PM)  . nystatin (MYCOSTATIN/NYSTOP) powder Apply topically 2 (two) times daily.  . OXYGEN Inhale 2 L into the lungs as needed (shortness of breath).  . SYMBICORT 160-4.5 MCG/ACT inhaler USE 2 INHALATIONS TWICE A DAY  . [DISCONTINUED] levofloxacin (LEVAQUIN) 750 MG tablet Take 1 tablet (750 mg total) by mouth daily.  . Na Sulfate-K Sulfate-Mg Sulf 17.5-3.13-1.6 GM/180ML SOLN Take 1 kit by mouth once.  . [DISCONTINUED] Coenzyme Q10 100 MG capsule Take 3 capsules (300 mg total) by mouth daily. (Patient not taking: Reported on 12/13/2016)  . [DISCONTINUED] guaiFENesin (MUCINEX) 600 MG 12 hr tablet Take 2 tablets (1,200 mg total) by mouth 2 (two) times daily.  . [DISCONTINUED] predniSONE (STERAPRED UNI-PAK 21 TAB) 10 MG (21) TBPK tablet Take 6-5-4-3-2-1 PO orally till gone   No facility-administered encounter medications on file as of 02/03/2017.    Allergies  Allergen Reactions  . Spiriva Handihaler [Tiotropium Bromide Monohydrate] Hives, Itching and Rash     on upper body  . Banana Itching   Patient Active  Problem List   Diagnosis Date Noted  . Acute URI 12/13/2016  . COPD with acute exacerbation ([place]) 12/13/2016  . Coronary artery calcification 10/23/2016  . Hyperlipidemia LDL goal <100 10/23/2016  . Medication management 10/23/2016  . DOE (dyspnea on exertion) 10/23/2016  . Chest pain with moderate risk for cardiac etiology 10/23/2016  . Lung nodule 02/26/2015  . Nodule of right lung  11/28/2014  . Acute calculous cholecystitis 06/22/2014  . Acute cholecystitis 06/22/2014  . Pre-operative respiratory examination 06/22/2014  . Abnormal chest x-ray 05/28/2014  . COPD, moderate ([place]) 05/07/2014  . Acute on chronic respiratory failure ([place]) 03/27/2014  . COPD exacerbation ([employer]) 03/27/2014  . CAP (community acquired pneumonia) 03/27/2014  . Nicotine abuse 03/27/2014  . Hypokalemia 03/27/2014  . Leukocytosis 03/27/2014  . TOBACCO USE 11/05/2008  . COPD 11/05/2008  . HYPERGLYCEMIA 11/05/2008  . ANEMIA-NOS 07/21/2007   Social History   Social History  . Marital status: Divorced    Spouse name: N/A  . Number of children: N/A  . Years of education: N/A   Occupational History  . retired    Social History Main Topics  . Smoking status: Current Every Day Smoker    Packs/day: 0.50    Years: 60.00    Types: Cigarettes  . Smokeless tobacco: Current User     Comment: 1/2 ppd 11.20.17  . Alcohol use No  . Drug use: No  . Sexual activity: No   Other Topics Concern  . Not on file   Social History Narrative  . No narrative on file    Ms. Sereno's family history includes Alzheimer's disease in her mother; Cancer in her father; Diabetes in her mother.      Objective:    Vitals:   02/03/17 1020  BP: 120/60  Pulse: 90    Physical Exam   well-developed  Older white female in no acute distress, pleasant blood pressure 120/60 pulse 90, Height 5 foot 2, weight 147, BMI 26.4. HEENT; nontraumatic normocephalic EOMI PERRLA sclera anicteric, Cardiovascular; regular rate and rhythm with S1-S2 no murmur or gallop, Pulmonary ;clear bilaterally, Abdomen; soft nontender, nondistended bowel sounds are active there is no palpable mass or hepatosplenomegaly, Rectal; exam not done, Extremities; no clubbing cyanosis or edema skin warm and dry, Neuropsych ;mood and affect appropriate       Assessment & Plan:   #21 76 year old white female with long-term solid food dysphagia,  recent abnormal barium swallow with smooth narrowing at the distal esophagus and normal esophageal motility. Rule out distal esophageal ring or stricture #2 history of adenomatous colon polyps, last colonoscopy February 2010, overdue for follow-up #3 COPD with intermittent oxygen use #4 status post cholecystectomy   #5 diverticulosis  Plan; Patient will be scheduled for EGD with probable esophageal dilation and colonoscopy with Dr. Havery Moros. Both procedures were discussed in detail with patient including risks and benefits and she is agreeable to proceed. Procedures will be scheduled at [facility] given intermittent oxygen use, and have been scheduled for 02/19/2017.    Amy S Esterwood PA-C 02/03/2017   Cc: Marda Stalker, PA-C

## 2017-02-19 NOTE — Op Note (Signed)
[facility] Patient Name: Valerie Gay Procedure Date: 02/19/2017 MRN: [id number] Attending MD: Carlota Raspberry. Armbruster MD, MD Date of Birth: [birth year] CSN: [id number] Age: 76 Admit Type: Outpatient Procedure:                Colonoscopy Indications:              Surveillance: Personal history of adenomatous                            polyps on last colonoscopy > 5 years ago Providers:                Remo Lipps P. Armbruster MD, MD, Laverta Baltimore RN,                            RN, Alfonso Patten, Technician, Thurston Alday CRNA,                            CRNA Referring MD:              Medicines:                Monitored Anesthesia Care Complications:            No immediate complications. Estimated blood loss:                            Minimal. Estimated Blood Loss:     Estimated blood loss was minimal. Procedure:                Pre-Anesthesia Assessment:                           - Prior to the procedure, a History and Physical                            was performed, and patient medications and                            allergies were reviewed. The patient's tolerance of                            previous anesthesia was also reviewed. The risks                            and benefits of the procedure and the sedation                            options and risks were discussed with the patient.                            All questions were answered, and informed consent                            was obtained. Prior Anticoagulants: The patient has  taken aspirin, last dose was 1 day prior to                            procedure. ASA Grade Assessment: III - A patient                            with severe systemic disease. After reviewing the                            risks and benefits, the patient was deemed in                            satisfactory condition to undergo the procedure.                           After obtaining informed  consent, the colonoscope                            was passed under direct vision. Throughout the                            procedure, the patient's blood pressure, pulse, and                            oxygen saturations were monitored continuously. The                            EC-[id number] ([id number]) scope was introduced through                            the anus and advanced to the the cecum, identified                            by appendiceal orifice and ileocecal valve. The                            colonoscopy was performed without difficulty. The                            patient tolerated the procedure well. The quality                            of the bowel preparation was good. The ileocecal                            valve, appendiceal orifice, and rectum were                            photographed. Scope In: 11:43:29 AM Scope Out: 29:51:88 PM Scope Withdrawal Time: 0 hours 19 minutes 15 seconds  Total Procedure Duration: 0 hours 24 minutes 49 seconds  Findings:      The perianal exam findings include non-thrombosed external hemorrhoids.      A 5 mm polyp was found in  the cecum. The polyp was sessile. The polyp       was removed with a cold snare. Resection and retrieval were complete.      A 3 mm polyp was found in the ileocecal valve. The polyp was flat. The       polyp was removed with a cold biopsy forceps. Resection and retrieval       were complete.      A 6 mm polyp was found in the ascending colon. The polyp was sessile.       The polyp was removed with a cold snare. Resection and retrieval were       complete.      A 5 mm polyp was found in the transverse colon. The polyp was sessile.       The polyp was removed with a cold snare. Resection and retrieval were       complete.      A few small-mouthed diverticula were found in the sigmoid colon.      Internal hemorrhoids were found during retroflexion. The hemorrhoids       were large.      The exam was  otherwise without abnormality. Impression:               - Non-thrombosed external hemorrhoids found on                            perianal exam.                           - One 5 mm polyp in the cecum, removed with a cold                            snare. Resected and retrieved.                           - One 3 mm polyp at the ileocecal valve, removed                            with a cold biopsy forceps. Resected and retrieved.                           - One 6 mm polyp in the ascending colon, removed                            with a cold snare. Resected and retrieved.                           - One 5 mm polyp in the transverse colon, removed                            with a cold snare. Resected and retrieved.                           - Diverticulosis in the sigmoid colon.                           - Internal hemorrhoids.                           -  The examination was otherwise normal. Moderate Sedation:      No moderate sedation, case performed with MAC Recommendation:           - Patient has a contact number available for                            emergencies. The signs and symptoms of potential                            delayed complications were discussed with the                            patient. Return to normal activities tomorrow.                            Written discharge instructions were provided to the                            patient.                           - Resume previous diet.                           - Continue present medications.                           - Await pathology results.                           - Repeat colonoscopy is recommended for                            surveillance. The colonoscopy date will be                            determined after pathology results from today's                            exam become available for review.                           - No ibuprofen, naproxen, or other non-steroidal                             anti-inflammatory drugs for 2 weeks after polyp                            removal. Procedure Code(s):        --- Professional ---                           [phone], Colonoscopy, flexible; with removal of                            tumor(s), polyp(s), or other lesion(s) by snare  technique                           [id number], 59, Colonoscopy, flexible; with biopsy,                            single or multiple Diagnosis Code(s):        --- Professional ---                           Z86.010, Personal history of colonic polyps                           D12.0, Benign neoplasm of cecum                           D12.2, Benign neoplasm of ascending colon                           D12.3, Benign neoplasm of transverse colon (hepatic                            flexure or splenic flexure)                           K64.4, Residual hemorrhoidal skin tags                           K64.8, Other hemorrhoids CPT copyright 2016 American Medical Association. All rights reserved. The codes documented in this report are preliminary and upon coder review may  be revised to meet current compliance requirements. Remo Lipps P. Armbruster MD, MD 02/19/2017 12:17:46 PM This report has been signed electronically. Number of Addenda: 0

## 2017-02-19 NOTE — Transfer of Care (Signed)
Immediate Anesthesia Transfer of Care Note  Patient: Valerie Gay  Procedure(s) Performed: Procedure(s): COLONOSCOPY (N/A) ESOPHAGOGASTRODUODENOSCOPY (EGD) (N/A)  Patient Location: PACU  Anesthesia Type:MAC  Level of Consciousness: sedated  Airway & Oxygen Therapy: Patient Spontanous Breathing and Patient connected to nasal cannula oxygen  Post-op Assessment: Report given to RN and Post -op Vital signs reviewed and stable  Post vital signs: Reviewed and stable  Last Vitals:  Vitals:   02/19/17 1103 02/19/17 1107  BP: (!) 139/58   Pulse: (!) 108   Resp: (!) 26   Temp:  36.7 C    Last Pain:  Vitals:   02/19/17 1107  TempSrc: Oral         Complications: No apparent anesthesia complications

## 2017-02-19 NOTE — Op Note (Signed)
[facility] Patient Name: Valerie Gay Procedure Date: 02/19/2017 MRN: [id number] Attending MD: Carlota Raspberry. Armbruster MD, MD Date of Birth: 10/31/1941 CSN: [id number] Age: 76 Admit Type: Outpatient Procedure:                Upper GI endoscopy Indications:              Dysphagia, Abnormal esophagram with smooth stenosis                            of distal esophagus Providers:                Carlota Raspberry. Armbruster MD, MD, Laverta Baltimore RN,                            RN, Alfonso Patten, Technician, Atkinson Alday CRNA,                            CRNA Referring MD:              Medicines:                Monitored Anesthesia Care Complications:            No immediate complications. Estimated blood loss:                            Minimal. Estimated Blood Loss:     Estimated blood loss was minimal. Procedure:                Pre-Anesthesia Assessment:                           - Prior to the procedure, a History and Physical                            was performed, and patient medications and                            allergies were reviewed. The patient's tolerance of                            previous anesthesia was also reviewed. The risks                            and benefits of the procedure and the sedation                            options and risks were discussed with the patient.                            All questions were answered, and informed consent                            was obtained. Prior Anticoagulants: The patient has                            taken aspirin,  last dose was 1 day prior to                            procedure. ASA Grade Assessment: III - A patient                            with severe systemic disease. After reviewing the                            risks and benefits, the patient was deemed in                            satisfactory condition to undergo the procedure.                           After obtaining informed consent,  the endoscope was                            passed under direct vision. Throughout the                            procedure, the patient's blood pressure, pulse, and                            oxygen saturations were monitored continuously. The                            [id number] ([id number]) scope was introduced through the                            mouth, and advanced to the second part of duodenum.                            The upper GI endoscopy was accomplished without                            difficulty. The patient tolerated the procedure                            well. Scope In: Scope Out: Findings:      Esophagogastric landmarks were identified: the Z-line was found at 37       cm, the gastroesophageal junction was found at 37 cm and the upper       extent of the gastric folds was found at 38 cm from the incisors.      A 1 cm hiatal hernia was present.      One severe benign-appearing, intrinsic stenosis was found. This measured       less than one cm (in length) and the lumen was only a few mm wide       (3-87m). It was benign in appearance, there was no abnormal mucosa       within the stricture and was traversed with the endoscope with mild       resistance. The upper endoscope dilated the stricture and caused a  moderate mucosal wrent with passage, no further dilation was performed       in this light.      The exam of the esophagus was otherwise normal.      The entire examined stomach was normal.      The duodenal bulb and second portion of the duodenum were normal. Impression:               - Esophagogastric landmarks identified.                           - 1 cm hiatal hernia.                           - Benign-appearing esophageal stenosis, dilated                            with the upper endoscope itself with moderated                            mucosal wrent.                           - Normal stomach.                           - Normal duodenal bulb and  second portion of the                            duodenum. Moderate Sedation:      No moderate sedation, case performed with MAC Recommendation:           - Patient has a contact number available for                            emergencies. The signs and symptoms of potential                            delayed complications were discussed with the                            patient. Return to normal activities tomorrow.                            Written discharge instructions were provided to the                            patient.                           - Resume soft diet, chew food well                           - NO meats to prevent impaction                           - Continue present medications.                           -  Repeat upper endoscopy in 2-3 weeks for                            retreatment and further dilation. Procedure Code(s):        --- Professional ---                           [phone], Esophagogastroduodenoscopy, flexible,                            transoral; diagnostic, including collection of                            specimen(s) by brushing or washing, when performed                            (separate procedure) Diagnosis Code(s):        --- Professional ---                           K44.9, Diaphragmatic hernia without obstruction or                            gangrene                           K22.2, Esophageal obstruction                           R13.10, Dysphagia, unspecified                           R93.3, Abnormal findings on diagnostic imaging of                            other parts of digestive tract CPT copyright 2016 American Medical Association. All rights reserved. The codes documented in this report are preliminary and upon coder review may  be revised to meet current compliance requirements. Remo Lipps P. Armbruster MD, MD 02/19/2017 12:24:22 PM This report has been signed electronically. Number of Addenda: 0

## 2017-02-19 NOTE — Discharge Instructions (Signed)
YOU HAD AN ENDOSCOPIC PROCEDURE TODAY: Refer to the procedure report and other information in the discharge instructions given to you for any specific questions about what was found during the examination. If this information does not answer your questions, please call [place] office at [phone] to clarify.  ° °YOU SHOULD EXPECT: Some feelings of bloating in the abdomen. Passage of more gas than usual. Walking can help get rid of the air that was put into your GI tract during the procedure and reduce the bloating. If you had a lower endoscopy (such as a colonoscopy or flexible sigmoidoscopy) you may notice spotting of blood in your stool or on the toilet paper. Some abdominal soreness may be present for a day or two, also. ° °DIET: Your first meal following the procedure should be a light meal and then it is ok to progress to your normal diet. A half-sandwich or bowl of soup is an example of a good first meal. Heavy or fried foods are harder to digest and may make you feel nauseous or bloated. Drink plenty of fluids but you should avoid alcoholic beverages for 24 hours. If you had a esophageal dilation, please see attached instructions for diet.   ° °ACTIVITY: Your care partner should take you home directly after the procedure. You should plan to take it easy, moving slowly for the rest of the day. You can resume normal activity the day after the procedure however YOU SHOULD NOT DRIVE, use power tools, machinery or perform tasks that involve climbing or major physical exertion for 24 hours (because of the sedation medicines used during the test).  ° °SYMPTOMS TO REPORT IMMEDIATELY: °A gastroenterologist can be reached at any hour. Please call [phone]  for any of the following symptoms:  °Following lower endoscopy (colonoscopy, flexible sigmoidoscopy) °Excessive amounts of blood in the stool  °Significant tenderness, worsening of abdominal pains  °Swelling of the abdomen that is new, acute  °Fever of 100° or  higher  °Following upper endoscopy (EGD, EUS, ERCP, esophageal dilation) °Vomiting of blood or coffee ground material  °New, significant abdominal pain  °New, significant chest pain or pain under the shoulder blades  °Painful or persistently difficult swallowing  °New shortness of breath  °Black, tarry-looking or red, bloody stools ° °FOLLOW UP:  °If any biopsies were taken you will be contacted by phone or by letter within the next 1-3 weeks. Call [phone]  if you have not heard about the biopsies in 3 weeks.  °Please also call with any specific questions about appointments or follow up tests. ° °

## 2017-02-19 NOTE — Anesthesia Preprocedure Evaluation (Signed)
Anesthesia Evaluation  Patient identified by MRN, date of birth, ID band Patient awake    Reviewed: Allergy & Precautions, H&P , Patient's Chart, lab work & pertinent test results, reviewed documented beta blocker date and time   Airway Mallampati: II  TM Distance: >3 FB Neck ROM: full    Dental no notable dental hx.    Pulmonary COPD, Current Smoker,    Pulmonary exam normal breath sounds clear to auscultation       Cardiovascular  Rhythm:regular Rate:Normal     Neuro/Psych    GI/Hepatic   Endo/Other    Renal/GU      Musculoskeletal   Abdominal   Peds  Hematology   Anesthesia Other Findings No recent URI's.....[name] chest clear  Reproductive/Obstetrics                             Anesthesia Physical Anesthesia Plan  ASA: II  Anesthesia Plan: MAC   Post-op Pain Management:    Induction: Intravenous  Airway Management Planned: Mask and Natural Airway  Additional Equipment:   Intra-op Plan:   Post-operative Plan:   Informed Consent: I have reviewed the patients History and Physical, chart, labs and discussed the procedure including the risks, benefits and alternatives for the proposed anesthesia with the patient or authorized representative who has indicated his/her understanding and acceptance.   Dental Advisory Given  Plan Discussed with: CRNA and Surgeon  Anesthesia Plan Comments:         Anesthesia Quick Evaluation

## 2017-02-19 NOTE — Anesthesia Postprocedure Evaluation (Signed)
Anesthesia Post Note  Patient: Valerie Gay  Procedure(s) Performed: Procedure(s) (LRB): COLONOSCOPY (N/A) ESOPHAGOGASTRODUODENOSCOPY (EGD) (N/A)  Patient location during evaluation: PACU Anesthesia Type: MAC Level of consciousness: awake and alert Pain management: pain level controlled Vital Signs Assessment: post-procedure vital signs reviewed and stable Respiratory status: spontaneous breathing, nonlabored ventilation, respiratory function stable and patient connected to nasal cannula oxygen Cardiovascular status: stable and blood pressure returned to baseline Anesthetic complications: no       Last Vitals:  Vitals:   02/19/17 1240 02/19/17 1250  BP: 136/70 140/66  Pulse: 89 85  Resp: (!) 24 (!) 24  Temp:      Last Pain:  Vitals:   02/19/17 1217  TempSrc: Oral                 Adelee Hannula EDWARD

## 2017-02-23 ENCOUNTER — Encounter (HOSPITAL_COMMUNITY): Payer: Self-pay | Admitting: Gastroenterology

## 2017-02-23 ENCOUNTER — Other Ambulatory Visit: Payer: Self-pay

## 2017-02-23 ENCOUNTER — Telehealth: Payer: Self-pay

## 2017-02-23 DIAGNOSIS — K222 Esophageal obstruction: Secondary | ICD-10-CM

## 2017-02-23 DIAGNOSIS — H353211 Exudative age-related macular degeneration, right eye, with active choroidal neovascularization: Secondary | ICD-10-CM | POA: Diagnosis not present

## 2017-02-23 NOTE — Telephone Encounter (Signed)
Spoke to patient she is aware that we have scheduled her for repeat EGD w/ dilation on 03/08/17 at 7:45 am. Her daughter will be out of town on 4/20-29th. Suggested she call [facility] to use their services of transport and aftercare. I have asked patient to call back here after checking with them to confirm.

## 2017-03-03 ENCOUNTER — Other Ambulatory Visit: Payer: Self-pay | Admitting: Internal Medicine

## 2017-03-04 ENCOUNTER — Encounter (HOSPITAL_COMMUNITY): Payer: Self-pay | Admitting: *Deleted

## 2017-03-05 ENCOUNTER — Telehealth: Payer: Self-pay | Admitting: Internal Medicine

## 2017-03-05 MED ORDER — ALBUTEROL SULFATE (2.5 MG/3ML) 0.083% IN NEBU: 2.5000 mg | INHALATION_SOLUTION | Freq: Four times a day (QID) | RESPIRATORY_TRACT | 5 refills | Status: DC | PRN

## 2017-03-05 MED ORDER — ALBUTEROL SULFATE HFA 108 (90 BASE) MCG/ACT IN AERS: 2.0000 | INHALATION_SPRAY | Freq: Four times a day (QID) | RESPIRATORY_TRACT | 5 refills | Status: DC | PRN

## 2017-03-05 NOTE — Telephone Encounter (Signed)
Pt requesting refill on albuterol inhaler and neb solution.  These have been sent to preferred pharmacy.  Nothing further needed.

## 2017-03-05 NOTE — Telephone Encounter (Signed)
Pt also wanting a rescue inhaler, please advise.Hillery Hunter

## 2017-03-08 ENCOUNTER — Encounter (HOSPITAL_COMMUNITY)
Admission: RE | Disposition: A | Discharge status: Home / Self Care | Payer: Self-pay | Source: Ambulatory Visit | Attending: Gastroenterology

## 2017-03-08 ENCOUNTER — Ambulatory Visit (HOSPITAL_COMMUNITY)
Admission: RE | Admit: 2017-03-08 | Discharge: 2017-03-08 | Disposition: A | Discharge status: Home / Self Care | Payer: Medicare Other | Source: Ambulatory Visit | Attending: Gastroenterology | Admitting: Gastroenterology

## 2017-03-08 ENCOUNTER — Encounter (HOSPITAL_COMMUNITY): Payer: Self-pay

## 2017-03-08 ENCOUNTER — Ambulatory Visit (HOSPITAL_COMMUNITY): Payer: Medicare Other | Admitting: Certified Registered Nurse Anesthetist

## 2017-03-08 DIAGNOSIS — Z833 Family history of diabetes mellitus: Secondary | ICD-10-CM | POA: Diagnosis not present

## 2017-03-08 DIAGNOSIS — Z818 Family history of other mental and behavioral disorders: Secondary | ICD-10-CM | POA: Insufficient documentation

## 2017-03-08 DIAGNOSIS — Z86010 Personal history of colonic polyps: Secondary | ICD-10-CM | POA: Diagnosis not present

## 2017-03-08 DIAGNOSIS — K222 Esophageal obstruction: Secondary | ICD-10-CM

## 2017-03-08 DIAGNOSIS — F17210 Nicotine dependence, cigarettes, uncomplicated: Secondary | ICD-10-CM | POA: Insufficient documentation

## 2017-03-08 DIAGNOSIS — R1310 Dysphagia, unspecified: Secondary | ICD-10-CM | POA: Diagnosis not present

## 2017-03-08 DIAGNOSIS — E876 Hypokalemia: Secondary | ICD-10-CM | POA: Diagnosis not present

## 2017-03-08 DIAGNOSIS — Z9049 Acquired absence of other specified parts of digestive tract: Secondary | ICD-10-CM | POA: Insufficient documentation

## 2017-03-08 DIAGNOSIS — E785 Hyperlipidemia, unspecified: Secondary | ICD-10-CM | POA: Diagnosis not present

## 2017-03-08 DIAGNOSIS — Z9841 Cataract extraction status, right eye: Secondary | ICD-10-CM | POA: Insufficient documentation

## 2017-03-08 DIAGNOSIS — J449 Chronic obstructive pulmonary disease, unspecified: Secondary | ICD-10-CM | POA: Diagnosis not present

## 2017-03-08 DIAGNOSIS — Z9889 Other specified postprocedural states: Secondary | ICD-10-CM | POA: Diagnosis not present

## 2017-03-08 DIAGNOSIS — K449 Diaphragmatic hernia without obstruction or gangrene: Secondary | ICD-10-CM | POA: Diagnosis not present

## 2017-03-08 HISTORY — PX: ESOPHAGOGASTRODUODENOSCOPY: SHX5428

## 2017-03-08 HISTORY — PX: BALLOON DILATION: SHX5330

## 2017-03-08 SURGERY — BALLOON DILATION
Anesthesia: Monitor Anesthesia Care

## 2017-03-08 MED ORDER — LACTATED RINGERS IV SOLN: INTRAVENOUS | Status: DC

## 2017-03-08 MED ORDER — LIDOCAINE HCL (CARDIAC) 20 MG/ML IV SOLN
INTRAVENOUS | Status: DC | PRN
  Administered 2017-03-08: 100 mg via INTRAVENOUS

## 2017-03-08 MED ORDER — LIDOCAINE 2% (20 MG/ML) 5 ML SYRINGE
INTRAMUSCULAR | Status: AC
  Filled 2017-03-08: qty 5

## 2017-03-08 MED ORDER — PROPOFOL 500 MG/50ML IV EMUL
INTRAVENOUS | Status: DC | PRN
  Administered 2017-03-08 (×3): 20 mg via INTRAVENOUS
  Administered 2017-03-08: 10 mg via INTRAVENOUS
  Administered 2017-03-08 (×4): 20 mg via INTRAVENOUS
  Administered 2017-03-08: 10 mg via INTRAVENOUS
  Administered 2017-03-08: 50 mg via INTRAVENOUS
  Administered 2017-03-08: 20 mg via INTRAVENOUS
  Administered 2017-03-08: 10 mg via INTRAVENOUS
  Administered 2017-03-08: 20 mg via INTRAVENOUS

## 2017-03-08 MED ORDER — SODIUM CHLORIDE 0.9 % IV SOLN
INTRAVENOUS | Status: DC
Start: 2017-03-08 — End: 2017-03-08

## 2017-03-08 MED ORDER — PROPOFOL 10 MG/ML IV BOLUS
INTRAVENOUS | Status: AC
  Filled 2017-03-08: qty 40

## 2017-03-08 MED ORDER — LACTATED RINGERS IV SOLN
INTRAVENOUS | Status: DC | PRN
  Administered 2017-03-08: 07:00:00 via INTRAVENOUS

## 2017-03-08 NOTE — Op Note (Signed)
[facility] Patient Name: Valerie Gay Procedure Date: 03/08/2017 MRN: [id number] Attending MD: Carlota Raspberry. Timothea Bodenheimer MD, MD Date of Birth: 1941-02-21 CSN: [id number] Age: 76 Admit Type: Outpatient Procedure:                Upper GI endoscopy Indications:              Dysphagia Providers:                Remo Lipps P. Maury Bamba MD, MD, Hilma Favors, RN,                            William Dalton, Technician, Marcene Duos,                            Technician Referring MD:              Medicines:                Monitored Anesthesia Care Complications:            No immediate complications. Estimated blood loss:                            Minimal. Estimated Blood Loss:     Estimated blood loss was minimal. Procedure:                Pre-Anesthesia Assessment:                           - Prior to the procedure, a History and Physical                            was performed, and patient medications and                            allergies were reviewed. The patient's tolerance of                            previous anesthesia was also reviewed. The risks                            and benefits of the procedure and the sedation                            options and risks were discussed with the patient.                            All questions were answered, and informed consent                            was obtained. Prior Anticoagulants: The patient has                            taken aspirin, last dose was 1 day prior to                            procedure. ASA Grade Assessment:  III - A patient                            with severe systemic disease. After reviewing the                            risks and benefits, the patient was deemed in                            satisfactory condition to undergo the procedure.                           After obtaining informed consent, the endoscope was                            passed under direct vision. Throughout the                           procedure, the patient's blood pressure, pulse, and                            oxygen saturations were monitored continuously. The                            Endoscope was introduced through the mouth, and                            advanced to the body of the stomach. The upper GI                            endoscopy was accomplished without difficulty. The                            patient tolerated the procedure well. Scope In: Scope Out: Findings:      Esophagogastric landmarks were identified: the Z-line was found at 37       cm, the gastroesophageal junction was found at 37 cm and the upper       extent of the gastric folds was found at 38 cm from the incisors.      A 1 cm hiatal hernia was present.      One moderate benign-appearing, intrinsic stenosis was found at the lower       esophagus, located 1-2cm proximal to the GEJ. This measured less than       one cm (in length) and was traversed. It appeared to have a larger       diameter when compared to the initial exam performed a few weeks ago       (stricture dilated with endoscope only at that time). A TTS dilator was       passed through the scope. Dilation with a 08-27-11 mm balloon was       performed at all sizes, and then a 12-13.5-15 mm balloon dilator was       performed to 13.5 and then 15 mm. Appropriate mucosal wrents were noted       following dilation.      The exam of the  esophagus was otherwise normal.      The cardia, gastric fundus and gastric body were normal. Impression:               - Esophagogastric landmarks identified.                           - 1 cm hiatal hernia.                           - Benign-appearing esophageal stenosis. Dilated to                            72m with good result.                           - Normal cardia, gastric fundus and gastric body.                           - No specimens collected. Moderate Sedation:      No moderate sedation, case performed  with MAC Recommendation:           - Patient has a contact number available for                            emergencies. The signs and symptoms of potential                            delayed complications were discussed with the                            patient. Return to normal activities tomorrow.                            Written discharge instructions were provided to the                            patient.                           - Resume previous diet, advance to soft as                            tolerated. Avoid meats.                           - Continue present medications.                           - Repeat upper endoscopy in roughly 3 weeks for                            retreatment as needed. Procedure Code(s):        --- Professional ---                           [id number], Esophagogastroduodenoscopy, flexible,  transoral; with transendoscopic balloon dilation of                            esophagus (less than 30 mm diameter) Diagnosis Code(s):        --- Professional ---                           K44.9, Diaphragmatic hernia without obstruction or                            gangrene                           K22.2, Esophageal obstruction                           R13.10, Dysphagia, unspecified CPT copyright 2016 American Medical Association. All rights reserved. The codes documented in this report are preliminary and upon coder review may  be revised to meet current compliance requirements. Remo Lipps P. Freddrick Gladson MD, MD 03/08/2017 8:06:06 AM This report has been signed electronically. Number of Addenda: 0

## 2017-03-08 NOTE — Interval H&P Note (Signed)
History and Physical Interval Note:  03/08/2017 7:25 AM  Valerie Gay  has presented today for surgery, with the diagnosis of esophageal stricture  The various methods of treatment have been discussed with the patient and family. After consideration of risks, benefits and other options for treatment, the patient has consented to  Procedure(s): BALLOON DILATION (N/A) ESOPHAGOGASTRODUODENOSCOPY (EGD) (N/A) as a surgical intervention .  The patient's history has been reviewed, patient examined, no change in status, stable for surgery.  I have reviewed the patient's chart and labs.  Questions were answered to the patient's satisfaction.     [name]

## 2017-03-08 NOTE — H&P (View-Only) (Signed)
   HPI :  76 y/o female with COPD here for follow up for endoscopy for stricture dilation. She has dysphagia to solids, smooth benign appearing stricture noted on EGD a few weeks ago. Lumen 3-4 mm wide, the endoscope dilated the esophagus with mucosal wrents noted using just the scope itself. Here for repeat dilation.  Past Medical History:  Diagnosis Date  . Anemia age 24  . Colon polyps    2010   . COPD (chronic obstructive pulmonary disease) ([employer])   . Diverticulosis   . Hyperlipidemia      Past Surgical History:  Procedure Laterality Date  . CATARACT EXTRACTION Right    August 15, 2013  . CHOLECYSTECTOMY N/A 06/22/2014   Procedure: LAPAROSCOPIC CHOLECYSTECTOMY WITH INTRAOPERATIVE CHOLANGIOGRAM;  Surgeon: Gayland Curry, MD;  Location: [facility];  Service: General;  Laterality: N/A;  . COLONOSCOPY N/A 02/19/2017   Procedure: COLONOSCOPY;  Surgeon: Manus Gunning, MD;  Location: [facility];  Service: Gastroenterology;  Laterality: N/A;  . ESOPHAGOGASTRODUODENOSCOPY N/A 02/19/2017   Procedure: ESOPHAGOGASTRODUODENOSCOPY (EGD);  Surgeon: Manus Gunning, MD;  Location: [facility];  Service: Gastroenterology;  Laterality: N/A;  . NM MYOVIEW LTD  10/2016    EF 55-65% with normal LV function. LOW RISK. No ischemia or infarction.  . TONSILLECTOMY     Family History  Problem Relation Age of Onset  . Alzheimer's disease Mother   . Diabetes Mother   . Cancer Father    Social History  Substance Use Topics  . Smoking status: Current Every Day Smoker    Packs/day: 0.50    Years: 60.00    Types: Cigarettes  . Smokeless tobacco: Never Used     Comment: 1/2 ppd 11.20.17  . Alcohol use No   Current Facility-Administered Medications  Medication Dose Route Frequency Provider Last Rate Last Dose  . 0.9 %  sodium chloride infusion   Intravenous Continuous Manus Gunning, MD      . lactated ringers infusion   Intravenous Continuous Manus Gunning, MD        Allergies  Allergen Reactions  . Spiriva Handihaler [Tiotropium Bromide Monohydrate] Hives, Itching and Rash     on upper body  . Banana Itching     Review of Systems: All systems reviewed and negative except where noted in HPI.   Lab Results  Component Value Date   WBC 18.5 (H) 12/16/2016   HGB 12.1 12/16/2016   HCT 38.5 12/16/2016   MCV 97.5 12/16/2016   PLT 373 12/16/2016    No results found for: INR, PROTIME   Physical Exam: BP (!) 141/59   Pulse 74   Temp 98.2 F (36.8 C) (Oral)   Resp (!) 29   Ht 5\' 2"  (1.575 m)   Wt 147 lb (66.7 kg)   SpO2 98%   BMI 26.89 kg/m  Constitutional: Pleasant,well-developed, female in no acute distress. Cardiovascular: Normal rate, regular rhythm.  Pulmonary/chest: Effort normal and breath sounds normal. No wheezing, rales or rhonchi. Abdominal: Soft, nondistended, nontender.  There are no masses palpable. No hepatomegaly. Extremities: no edema   ASSESSMENT AND PLAN: 76 y/o female with severe distal esophageal stricture, here for repeat EGD with dilation today. Discussed risks / benefits of EGD and anesthesia with patient and her son, they wished to proceed.   [place] Cellar, MD [facility] Gastroenterology Pager [phone]

## 2017-03-08 NOTE — Transfer of Care (Signed)
Immediate Anesthesia Transfer of Care Note  Patient: Valerie Gay  Procedure(s) Performed: Procedure(s): BALLOON DILATION (N/A) ESOPHAGOGASTRODUODENOSCOPY (EGD) (N/A)  Patient Location: PACU  Anesthesia Type:MAC  Level of Consciousness: awake, alert  and oriented  Airway & Oxygen Therapy: Patient connected to nasal cannula oxygen  Post-op Assessment: Report given to RN and Post -op Vital signs reviewed and stable  Post vital signs: Reviewed and stable  Last Vitals:  Vitals:   03/08/17 0635  BP: (!) 141/59  Pulse: 74  Resp: (!) 29  Temp: 36.8 C    Last Pain:  Vitals:   03/08/17 0635  TempSrc: Oral         Complications: No apparent anesthesia complications

## 2017-03-08 NOTE — Consult Note (Signed)
   HPI :  76 y/o female with COPD here for follow up for endoscopy for stricture dilation. She has dysphagia to solids, smooth benign appearing stricture noted on EGD a few weeks ago. Lumen 3-4 mm wide, the endoscope dilated the esophagus with mucosal wrents noted using just the scope itself. Here for repeat dilation.  Past Medical History:  Diagnosis Date  . Anemia age 45  . Colon polyps    2010   . COPD (chronic obstructive pulmonary disease) ([place])   . Diverticulosis   . Hyperlipidemia      Past Surgical History:  Procedure Laterality Date  . CATARACT EXTRACTION Right    August 15, 2013  . CHOLECYSTECTOMY N/A 06/22/2014   Procedure: LAPAROSCOPIC CHOLECYSTECTOMY WITH INTRAOPERATIVE CHOLANGIOGRAM;  Surgeon: Gayland Curry, MD;  Location: [facility];  Service: General;  Laterality: N/A;  . COLONOSCOPY N/A 02/19/2017   Procedure: COLONOSCOPY;  Surgeon: Manus Gunning, MD;  Location: [facility];  Service: Gastroenterology;  Laterality: N/A;  . ESOPHAGOGASTRODUODENOSCOPY N/A 02/19/2017   Procedure: ESOPHAGOGASTRODUODENOSCOPY (EGD);  Surgeon: Manus Gunning, MD;  Location: [facility];  Service: Gastroenterology;  Laterality: N/A;  . NM MYOVIEW LTD  10/2016    EF 55-65% with normal LV function. LOW RISK. No ischemia or infarction.  . TONSILLECTOMY     Family History  Problem Relation Age of Onset  . Alzheimer's disease Mother   . Diabetes Mother   . Cancer Father    Social History  Substance Use Topics  . Smoking status: Current Every Day Smoker    Packs/day: 0.50    Years: 60.00    Types: Cigarettes  . Smokeless tobacco: Never Used     Comment: 1/2 ppd 11.20.17  . Alcohol use No   Current Facility-Administered Medications  Medication Dose Route Frequency Provider Last Rate Last Dose  . 0.9 %  sodium chloride infusion   Intravenous Continuous Manus Gunning, MD      . lactated ringers infusion   Intravenous Continuous Manus Gunning, MD        Allergies  Allergen Reactions  . Spiriva Handihaler [Tiotropium Bromide Monohydrate] Hives, Itching and Rash     on upper body  . Banana Itching     Review of Systems: All systems reviewed and negative except where noted in HPI.   Lab Results  Component Value Date   WBC 18.5 (H) 12/16/2016   HGB 12.1 12/16/2016   HCT 38.5 12/16/2016   MCV 97.5 12/16/2016   PLT 373 12/16/2016    No results found for: INR, PROTIME   Physical Exam: BP (!) 141/59   Pulse 74   Temp 98.2 F (36.8 C) (Oral)   Resp (!) 29   Ht 5\' 2"  (1.575 m)   Wt 147 lb (66.7 kg)   SpO2 98%   BMI 26.89 kg/m  Constitutional: Pleasant,well-developed, female in no acute distress. Cardiovascular: Normal rate, regular rhythm.  Pulmonary/chest: Effort normal and breath sounds normal. No wheezing, rales or rhonchi. Abdominal: Soft, nondistended, nontender.  There are no masses palpable. No hepatomegaly. Extremities: no edema   ASSESSMENT AND PLAN: 76 y/o female with severe distal esophageal stricture, here for repeat EGD with dilation today. Discussed risks / benefits of EGD and anesthesia with patient and her son, they wished to proceed.   [place] Cellar, MD [facility] Gastroenterology Pager [phone]

## 2017-03-08 NOTE — Discharge Instructions (Signed)
YOU HAD AN ENDOSCOPIC PROCEDURE TODAY: Refer to the procedure report and other information in the discharge instructions given to you for any specific questions about what was found during the examination. If this information does not answer your questions, please call [facility] office at [phone] to clarify.  ° °YOU SHOULD EXPECT: Some feelings of bloating in the abdomen. Passage of more gas than usual. Walking can help get rid of the air that was put into your GI tract during the procedure and reduce the bloating. If you had a lower endoscopy (such as a colonoscopy or flexible sigmoidoscopy) you may notice spotting of blood in your stool or on the toilet paper. Some abdominal soreness may be present for a day or two, also. ° °DIET: Your first meal following the procedure should be a light meal and then it is ok to progress to your normal diet. A half-sandwich or bowl of soup is an example of a good first meal. Heavy or fried foods are harder to digest and may make you feel nauseous or bloated. Drink plenty of fluids but you should avoid alcoholic beverages for 24 hours. If you had a esophageal dilation, please see attached instructions for diet.   ° °ACTIVITY: Your care partner should take you home directly after the procedure. You should plan to take it easy, moving slowly for the rest of the day. You can resume normal activity the day after the procedure however YOU SHOULD NOT DRIVE, use power tools, machinery or perform tasks that involve climbing or major physical exertion for 24 hours (because of the sedation medicines used during the test).  ° °SYMPTOMS TO REPORT IMMEDIATELY: °A gastroenterologist can be reached at any hour. Please call [phone]  for any of the following symptoms:  °Following lower endoscopy (colonoscopy, flexible sigmoidoscopy) °Excessive amounts of blood in the stool  °Significant tenderness, worsening of abdominal pains  °Swelling of the abdomen that is new, acute  °Fever of 100° or  higher  °Following upper endoscopy (EGD, EUS, ERCP, esophageal dilation) °Vomiting of blood or coffee ground material  °New, significant abdominal pain  °New, significant chest pain or pain under the shoulder blades  °Painful or persistently difficult swallowing  °New shortness of breath  °Black, tarry-looking or red, bloody stools ° °FOLLOW UP:  °If any biopsies were taken you will be contacted by phone or by letter within the next 1-3 weeks. Call [phone]  if you have not heard about the biopsies in 3 weeks.  °Please also call with any specific questions about appointments or follow up tests. ° °

## 2017-03-08 NOTE — Anesthesia Preprocedure Evaluation (Signed)
Anesthesia Evaluation  Patient identified by MRN, date of birth, ID band Patient awake    Reviewed: Allergy & Precautions, NPO status , Patient's Chart, lab work & pertinent test results  Airway Mallampati: II  TM Distance: >3 FB Neck ROM: Full    Dental no notable dental hx.    Pulmonary COPD, Current Smoker,    breath sounds clear to auscultation + decreased breath sounds      Cardiovascular + DOE  Normal cardiovascular exam Rhythm:Regular Rate:Normal     Neuro/Psych negative neurological ROS  negative psych ROS   GI/Hepatic negative GI ROS, Neg liver ROS,   Endo/Other  negative endocrine ROS  Renal/GU negative Renal ROS  negative genitourinary   Musculoskeletal negative musculoskeletal ROS (+)   Abdominal   Peds negative pediatric ROS (+)  Hematology negative hematology ROS (+)   Anesthesia Other Findings   Reproductive/Obstetrics negative OB ROS                             Anesthesia Physical Anesthesia Plan  ASA: III  Anesthesia Plan: MAC   Post-op Pain Management:    Induction: Intravenous  Airway Management Planned: Nasal Cannula  Additional Equipment:   Intra-op Plan:   Post-operative Plan: Extubation in OR  Informed Consent: I have reviewed the patients History and Physical, chart, labs and discussed the procedure including the risks, benefits and alternatives for the proposed anesthesia with the patient or authorized representative who has indicated his/her understanding and acceptance.   Dental advisory given  Plan Discussed with: CRNA and Surgeon  Anesthesia Plan Comments:         Anesthesia Quick Evaluation

## 2017-03-08 NOTE — Anesthesia Postprocedure Evaluation (Addendum)
Anesthesia Post Note  Patient: Valerie Gay  Procedure(s) Performed: Procedure(s) (LRB): BALLOON DILATION (N/A) ESOPHAGOGASTRODUODENOSCOPY (EGD) (N/A)  Patient location during evaluation: PACU Anesthesia Type: MAC Level of consciousness: awake and alert Pain management: pain level controlled Vital Signs Assessment: post-procedure vital signs reviewed and stable Respiratory status: spontaneous breathing, nonlabored ventilation, respiratory function stable and patient connected to nasal cannula oxygen Cardiovascular status: stable and blood pressure returned to baseline Anesthetic complications: no       Last Vitals:  Vitals:   03/08/17 0830 03/08/17 0832  BP: (!) 141/66 (!) 135/49  Pulse: 81 67  Resp: 17 (!) 25  Temp:      Last Pain:  Vitals:   03/08/17 0808  TempSrc: Oral                 Effie Berkshire

## 2017-03-09 ENCOUNTER — Encounter (HOSPITAL_COMMUNITY): Payer: Self-pay | Admitting: Gastroenterology

## 2017-03-11 ENCOUNTER — Other Ambulatory Visit: Payer: Self-pay | Admitting: *Deleted

## 2017-03-11 ENCOUNTER — Encounter: Payer: Self-pay | Admitting: *Deleted

## 2017-03-11 DIAGNOSIS — Z79899 Other long term (current) drug therapy: Secondary | ICD-10-CM

## 2017-03-11 DIAGNOSIS — E785 Hyperlipidemia, unspecified: Secondary | ICD-10-CM

## 2017-03-16 DIAGNOSIS — L03119 Cellulitis of unspecified part of limb: Secondary | ICD-10-CM

## 2017-03-16 DIAGNOSIS — L02619 Cutaneous abscess of unspecified foot: Secondary | ICD-10-CM

## 2017-03-16 HISTORY — DX: Cellulitis of unspecified part of limb: L03.119

## 2017-03-16 HISTORY — DX: Cutaneous abscess of unspecified foot: L02.619

## 2017-03-18 DIAGNOSIS — H353212 Exudative age-related macular degeneration, right eye, with inactive choroidal neovascularization: Secondary | ICD-10-CM | POA: Diagnosis not present

## 2017-04-02 DIAGNOSIS — L03115 Cellulitis of right lower limb: Secondary | ICD-10-CM | POA: Diagnosis not present

## 2017-04-04 DIAGNOSIS — K219 Gastro-esophageal reflux disease without esophagitis: Secondary | ICD-10-CM | POA: Diagnosis not present

## 2017-04-05 ENCOUNTER — Telehealth: Payer: Self-pay | Admitting: Gastroenterology

## 2017-04-05 ENCOUNTER — Other Ambulatory Visit: Payer: Self-pay

## 2017-04-05 DIAGNOSIS — R1310 Dysphagia, unspecified: Secondary | ICD-10-CM

## 2017-04-05 NOTE — Telephone Encounter (Signed)
Okay thanks for the update. She needs to drink plenty of water with pills. She relatively recently had an EGD for distal esophageal stricture with dilation to 25mm with a good response, I'm surprised she's had recurrence of symptoms so quickly. She should stay on a liquid diet at this time and needs another EGD. Given her COPD and oxygen requirement this will need to be done at the hospital. I am in the hospital all week, we can add her on any day, as soon as possible, Tues or Wed if possible. Can you help coordinate? Thanks

## 2017-04-05 NOTE — Telephone Encounter (Signed)
Patient states that since Friday she has been having a great deal of difficulty swallowing. Only able to have water, no food. She tried apple sauce but that came back up. She went to [facility] on Sunday, prescribed Prilosec 20 mg qd. She was able to finally get that down. She was also prescribed an antibiotic for a foot infections, having redness and swelling of foot. She was able to get the antibiotic down too. Please advise.

## 2017-04-06 ENCOUNTER — Encounter (HOSPITAL_COMMUNITY)
Admission: RE | Disposition: A | Discharge status: Home / Self Care | Payer: Self-pay | Source: Ambulatory Visit | Attending: Gastroenterology

## 2017-04-06 ENCOUNTER — Ambulatory Visit (HOSPITAL_COMMUNITY): Payer: Medicare Other | Admitting: Anesthesiology

## 2017-04-06 ENCOUNTER — Ambulatory Visit (HOSPITAL_COMMUNITY)
Admission: RE | Admit: 2017-04-06 | Discharge: 2017-04-06 | Disposition: A | Discharge status: Home / Self Care | Payer: Medicare Other | Source: Ambulatory Visit | Attending: Gastroenterology | Admitting: Gastroenterology

## 2017-04-06 ENCOUNTER — Encounter (HOSPITAL_COMMUNITY): Payer: Self-pay

## 2017-04-06 DIAGNOSIS — K222 Esophageal obstruction: Secondary | ICD-10-CM

## 2017-04-06 DIAGNOSIS — J449 Chronic obstructive pulmonary disease, unspecified: Secondary | ICD-10-CM | POA: Insufficient documentation

## 2017-04-06 DIAGNOSIS — Z9981 Dependence on supplemental oxygen: Secondary | ICD-10-CM | POA: Insufficient documentation

## 2017-04-06 DIAGNOSIS — Z888 Allergy status to other drugs, medicaments and biological substances status: Secondary | ICD-10-CM | POA: Insufficient documentation

## 2017-04-06 DIAGNOSIS — Z7982 Long term (current) use of aspirin: Secondary | ICD-10-CM | POA: Insufficient documentation

## 2017-04-06 DIAGNOSIS — F17210 Nicotine dependence, cigarettes, uncomplicated: Secondary | ICD-10-CM | POA: Insufficient documentation

## 2017-04-06 DIAGNOSIS — K2210 Ulcer of esophagus without bleeding: Secondary | ICD-10-CM | POA: Diagnosis not present

## 2017-04-06 DIAGNOSIS — Z79899 Other long term (current) drug therapy: Secondary | ICD-10-CM | POA: Diagnosis not present

## 2017-04-06 DIAGNOSIS — Z7951 Long term (current) use of inhaled steroids: Secondary | ICD-10-CM | POA: Insufficient documentation

## 2017-04-06 DIAGNOSIS — Z91018 Allergy to other foods: Secondary | ICD-10-CM | POA: Insufficient documentation

## 2017-04-06 DIAGNOSIS — D649 Anemia, unspecified: Secondary | ICD-10-CM | POA: Diagnosis not present

## 2017-04-06 DIAGNOSIS — X58XXXA Exposure to other specified factors, initial encounter: Secondary | ICD-10-CM | POA: Diagnosis not present

## 2017-04-06 DIAGNOSIS — T18128A Food in esophagus causing other injury, initial encounter: Secondary | ICD-10-CM | POA: Diagnosis not present

## 2017-04-06 DIAGNOSIS — R1310 Dysphagia, unspecified: Secondary | ICD-10-CM

## 2017-04-06 DIAGNOSIS — K209 Esophagitis, unspecified: Secondary | ICD-10-CM | POA: Diagnosis not present

## 2017-04-06 HISTORY — PX: ESOPHAGOGASTRODUODENOSCOPY: SHX5428

## 2017-04-06 SURGERY — EGD (ESOPHAGOGASTRODUODENOSCOPY)
Anesthesia: Monitor Anesthesia Care

## 2017-04-06 MED ORDER — SUCRALFATE 1 GM/10ML PO SUSP: 1.0000 g | Freq: Four times a day (QID) | ORAL | 1 refills | Status: DC

## 2017-04-06 MED ORDER — LACTATED RINGERS IV SOLN
INTRAVENOUS | Status: DC
  Administered 2017-04-06: 12:00:00 via INTRAVENOUS

## 2017-04-06 MED ORDER — LIDOCAINE 2% (20 MG/ML) 5 ML SYRINGE
INTRAMUSCULAR | Status: AC
  Filled 2017-04-06: qty 5

## 2017-04-06 MED ORDER — SODIUM CHLORIDE 0.9 % IV SOLN: INTRAVENOUS | Status: DC

## 2017-04-06 MED ORDER — PROPOFOL 10 MG/ML IV BOLUS
INTRAVENOUS | Status: AC
  Filled 2017-04-06: qty 40

## 2017-04-06 MED ORDER — LIDOCAINE 2% (20 MG/ML) 5 ML SYRINGE
INTRAMUSCULAR | Status: DC | PRN
  Administered 2017-04-06: 100 mg via INTRAVENOUS

## 2017-04-06 MED ORDER — PROPOFOL 10 MG/ML IV BOLUS
INTRAVENOUS | Status: DC | PRN
  Administered 2017-04-06 (×2): 20 mg via INTRAVENOUS

## 2017-04-06 MED ORDER — PROPOFOL 500 MG/50ML IV EMUL
INTRAVENOUS | Status: DC | PRN
  Administered 2017-04-06: 140 ug/kg/min via INTRAVENOUS

## 2017-04-06 NOTE — H&P (Signed)
HPI :  76 y/o female with a history of esophageal stricture s/p dilation x 2 in recent months, here for an EGD to evaluate recurrent symptoms of dysphagia. Endorses inability to eat any solids over the weekends, liquids only, due to sense of globus. Still not able to tolerate solids but can tolerate liquids. Here for EGD at hospital given COPD and oxygen use.   Past Medical History:  Diagnosis Date  . Anemia age 14  . Colon polyps    2010   . COPD (chronic obstructive pulmonary disease) ([place])   . Diverticulosis   . Hyperlipidemia      Past Surgical History:  Procedure Laterality Date  . BALLOON DILATION N/A 03/08/2017   Procedure: BALLOON DILATION;  Surgeon: Manus Gunning, MD;  Location: [facility];  Service: Gastroenterology;  Laterality: N/A;  . CATARACT EXTRACTION Right    August 15, 2013  . CHOLECYSTECTOMY N/A 06/22/2014   Procedure: LAPAROSCOPIC CHOLECYSTECTOMY WITH INTRAOPERATIVE CHOLANGIOGRAM;  Surgeon: Gayland Curry, MD;  Location: [facility];  Service: General;  Laterality: N/A;  . COLONOSCOPY N/A 02/19/2017   Procedure: COLONOSCOPY;  Surgeon: Manus Gunning, MD;  Location: [facility];  Service: Gastroenterology;  Laterality: N/A;  . ESOPHAGOGASTRODUODENOSCOPY N/A 02/19/2017   Procedure: ESOPHAGOGASTRODUODENOSCOPY (EGD);  Surgeon: Manus Gunning, MD;  Location: [facility];  Service: Gastroenterology;  Laterality: N/A;  . ESOPHAGOGASTRODUODENOSCOPY N/A 03/08/2017   Procedure: ESOPHAGOGASTRODUODENOSCOPY (EGD);  Surgeon: Manus Gunning, MD;  Location: [facility];  Service: Gastroenterology;  Laterality: N/A;  . NM MYOVIEW LTD  10/2016    EF 55-65% with normal LV function. LOW RISK. No ischemia or infarction.  . TONSILLECTOMY     Family History  Problem Relation Age of Onset  . Alzheimer's disease Mother   . Diabetes Mother   . Cancer Father    Social History  Substance Use Topics  . Smoking status: Current Every Day Smoker   Packs/day: 0.50    Years: 60.00    Types: Cigarettes  . Smokeless tobacco: Never Used     Comment: 1/2 ppd 11.20.17  . Alcohol use No   Current Facility-Administered Medications  Medication Dose Route Frequency Provider Last Rate Last Dose  . 0.9 %  sodium chloride infusion   Intravenous Continuous Huriel Matt, Renelda Loma, MD      . lactated ringers infusion   Intravenous Continuous Jamonta Goerner, Renelda Loma, MD 10 mL/hr at 04/06/17 1149     Allergies  Allergen Reactions  . Spiriva Handihaler [Tiotropium Bromide Monohydrate] Hives, Itching and Rash     on upper body  . Banana Itching     Review of Systems: All systems reviewed and negative except where noted in HPI.     Physical Exam: BP (!) 130/97   Pulse 89   Temp 98.2 F (36.8 C) (Oral)   Resp 19   Ht 5' 2.5" (1.588 m)   Wt 145 lb (65.8 kg)   SpO2 96%   BMI 26.10 kg/m  Constitutional: Pleasant,female in no acute distress.  Cardiovascular: Normal rate, regular rhythm.  Pulmonary/chest: Effort normal and breath sounds normal. No wheezing, rales or rhonchi. Abdominal: Soft, nondistended, nontender.. There are no masses palpable. No hepatomegaly. Extremities: no edema  ASSESSMENT AND PLAN: 76 y/o female with history of esophageal stricture s/p dilation, here for recurrent dysphagia which is severe at this time. Not sure if she had a transient or partial impaction, but is able to tolerate liquids. Discussed risks / benefits of EGD and anesthesia and she  wishes to proceed. Further recommendations pending the results.   [place] Cellar, MD [facility] Gastroenterology Pager [phone]

## 2017-04-06 NOTE — Discharge Instructions (Addendum)
YOU HAD AN ENDOSCOPIC PROCEDURE TODAY: Refer to the procedure report and other information in the discharge instructions given to you for any specific questions about what was found during the examination. If this information does not answer your questions, please call [place] office at [phone] to clarify.   YOU SHOULD EXPECT: Some feelings of bloating in the abdomen. Passage of more gas than usual. Walking can help get rid of the air that was put into your GI tract during the procedure and reduce the bloating. If you had a lower endoscopy (such as a colonoscopy or flexible sigmoidoscopy) you may notice spotting of blood in your stool or on the toilet paper. Some abdominal soreness may be present for a day or two, also.  DIET: Your first meal following the procedure should be a liquid diet today and then it is ok to progress to your soft diet. No meat until theraphy  Complete . Drink plenty of fluids but you should avoid alcoholic beverages for 24 hours. If you had a esophageal dilation, please see attached instructions for diet.    ACTIVITY: Your care partner should take you home directly after the procedure. You should plan to take it easy, moving slowly for the rest of the day. You can resume normal activity the day after the procedure however YOU SHOULD NOT DRIVE, use power tools, machinery or perform tasks that involve climbing or major physical exertion for 24 hours (because of the sedation medicines used during the test).   SYMPTOMS TO REPORT IMMEDIATELY: A gastroenterologist can be reached at any hour. Please call [phone]  for any of the following symptoms:  Following lower endoscopy (colonoscopy, flexible sigmoidoscopy) Excessive amounts of blood in the stool  Significant tenderness, worsening of abdominal pains  Swelling of the abdomen that is new, acute  Fever of 100 or higher  Following upper endoscopy (EGD, EUS, ERCP, esophageal dilation) Vomiting of blood or coffee ground  material  New, significant abdominal pain  New, significant chest pain or pain under the shoulder blades  Painful or persistently difficult swallowing  New shortness of breath  Black, tarry-looking or red, bloody stools  FOLLOW UP:  If any biopsies were taken you will be contacted by phone or by letter within the next 1-3 weeks. Call [phone]  if you have not heard about the biopsies in 3 weeks.  Please also call with any specific questions about appointments or follow up tests.

## 2017-04-06 NOTE — Transfer of Care (Signed)
Immediate Anesthesia Transfer of Care Note  Patient: Valerie Gay  Procedure(s) Performed: Procedure(s): ESOPHAGOGASTRODUODENOSCOPY (EGD) (N/A) BALLOON DILATION (N/A)  Patient Location: Endoscopy Unit  Anesthesia Type:MAC  Level of Consciousness: awake  Airway & Oxygen Therapy: Patient Spontanous Breathing and Patient connected to nasal cannula oxygen  Post-op Assessment: Report given to RN and Post -op Vital signs reviewed and stable  Post vital signs: Reviewed and stable  Last Vitals:  Vitals:   04/06/17 1137  BP: (!) 130/97  Pulse: 89  Resp: 19  Temp: 36.8 C    Last Pain:  Vitals:   04/06/17 1137  TempSrc: Oral         Complications: No apparent anesthesia complications

## 2017-04-06 NOTE — Interval H&P Note (Signed)
History and Physical Interval Note:  04/06/2017 12:00 PM  Valerie Gay  has presented today for surgery, with the diagnosis of dysphagia  The various methods of treatment have been discussed with the patient and family. After consideration of risks, benefits and other options for treatment, the patient has consented to  Procedure(s): ESOPHAGOGASTRODUODENOSCOPY (EGD) (N/A) BALLOON DILATION (N/A) as a surgical intervention .  The patient's history has been reviewed, patient examined, no change in status, stable for surgery.  I have reviewed the patient's chart and labs.  Questions were answered to the patient's satisfaction.     Renelda Loma Keyaria Lawson

## 2017-04-06 NOTE — Op Note (Signed)
[facility] Patient Name: Valerie Gay Procedure Date: 04/06/2017 MRN: [id number] Attending MD: Carlota Raspberry. Barclay Lennox MD, MD Date of Birth: 07-Sep-1941 CSN: [id number] Age: 76 Admit Type: Outpatient Procedure:                Upper GI endoscopy Indications:              Dysphagia - history of esophageal stricture s/p                            dilation, with new onset worsening dysphagia for                            the past few days Providers:                Remo Lipps P. Jarek Longton MD, MD, Laverta Baltimore RN,                            RN, Marcene Duos, Technician Referring MD:              Medicines:                Monitored Anesthesia Care Complications:            No immediate complications. Estimated blood loss:                            Minimal. Estimated Blood Loss:     Estimated blood loss was minimal. Procedure:                Pre-Anesthesia Assessment:                           - Prior to the procedure, a History and Physical                            was performed, and patient medications and                            allergies were reviewed. The patient's tolerance of                            previous anesthesia was also reviewed. The risks                            and benefits of the procedure and the sedation                            options and risks were discussed with the patient.                            All questions were answered, and informed consent                            was obtained. Prior Anticoagulants: The patient has  taken no previous anticoagulant or antiplatelet                            agents. ASA Grade Assessment: III - A patient with                            severe systemic disease. After reviewing the risks                            and benefits, the patient was deemed in                            satisfactory condition to undergo the procedure.                           After obtaining  informed consent, the endoscope was                            passed under direct vision. Throughout the                            procedure, the patient's blood pressure, pulse, and                            oxygen saturations were monitored continuously. The                            [id number]-[id number] ([id number]) scope was introduced through the                            mouth, and advanced to the body of the stomach. The                            upper GI endoscopy was accomplished without                            difficulty. The patient tolerated the procedure                            well. Scope In: Scope Out: Findings:      Esophagogastric landmarks were identified: the Z-line was found at 37       cm, the gastroesophageal junction was found at 37 cm and the upper       extent of the gastric folds was found at 39 cm from the incisors.      A food bolus (chicken?) was found in the lower third of the esophagus at       the site of the stricture, causing partial impaction. This was easily       pushed into the stomach.      One moderate benign-appearing, intrinsic stenosis was found at the site       of impaction with a large area of ulceration, due to the impaction.       Biopsies were taken with a cold forceps for histology. No further  dilation was performed given large ulceration noted.      The exam of the esophagus was otherwise normal.      The gastric fundus and gastric body were normal. Impression:               - Esophagogastric landmarks identified.                           - Partial food impaction in the lower third of the                            esophagus at site of stricture, with ulcerated                            esophageal stenosis at site of impaction. Biopsied.                           - Normal gastric fundus and gastric body. Moderate Sedation:      No moderate sedation, case performed with MAC Recommendation:           - Patient has a contact number  available for                            emergencies. The signs and symptoms of potential                            delayed complications were discussed with the                            patient. Return to normal activities tomorrow.                            Written discharge instructions were provided to the                            patient.                           - Liquid diet today, then soft diet moving forward.                            NO MEATS until further therapy is performed.                           - Continue present medications - omeprazole daily                           - Add carafate 10cc po q 6 hours (this will make                            your stools dark)                           - Await pathology results.                           -  Repeat upper endoscopy in 2-3 weeks for                            retreatment once ulceration heals Procedure Code(s):        --- Professional ---                           [phone], 52, Esophagogastroduodenoscopy, flexible,                            transoral; with biopsy, single or multiple Diagnosis Code(s):        --- Professional ---                           L89.211H, Food in esophagus causing other injury,                            initial encounter                           K22.2, Esophageal obstruction                           R13.10, Dysphagia, unspecified CPT copyright 2016 American Medical Association. All rights reserved. The codes documented in this report are preliminary and upon coder review may  be revised to meet current compliance requirements. Remo Lipps P. Elide Stalzer MD, MD 04/06/2017 1:38:23 PM This report has been signed electronically. Number of Addenda: 0

## 2017-04-06 NOTE — Anesthesia Preprocedure Evaluation (Addendum)
Anesthesia Evaluation  Patient identified by MRN, date of birth, ID band Patient awake    Reviewed: Allergy & Precautions, NPO status , Patient's Chart, lab work & pertinent test results  Airway Mallampati: II  TM Distance: >3 FB Neck ROM: Full    Dental no notable dental hx.    Pulmonary COPD, Current Smoker,     + wheezing      Cardiovascular Normal cardiovascular exam Rhythm:Regular Rate:Normal     Neuro/Psych negative neurological ROS  negative psych ROS   GI/Hepatic negative GI ROS, Neg liver ROS,   Endo/Other  negative endocrine ROS  Renal/GU negative Renal ROS  negative genitourinary   Musculoskeletal negative musculoskeletal ROS (+)   Abdominal   Peds negative pediatric ROS (+)  Hematology negative hematology ROS (+)   Anesthesia Other Findings   Reproductive/Obstetrics negative OB ROS                            Anesthesia Physical Anesthesia Plan  ASA: III  Anesthesia Plan: MAC   Post-op Pain Management:    Induction: Intravenous  Airway Management Planned: Nasal Cannula  Additional Equipment:   Intra-op Plan:   Post-operative Plan:   Informed Consent: I have reviewed the patients History and Physical, chart, labs and discussed the procedure including the risks, benefits and alternatives for the proposed anesthesia with the patient or authorized representative who has indicated his/her understanding and acceptance.   Dental advisory given  Plan Discussed with: CRNA and Surgeon  Anesthesia Plan Comments:         Anesthesia Quick Evaluation

## 2017-04-06 NOTE — Anesthesia Postprocedure Evaluation (Signed)
Anesthesia Post Note  Patient: Valerie Gay  Procedure(s) Performed: Procedure(s) (LRB): ESOPHAGOGASTRODUODENOSCOPY (EGD) (N/A) BALLOON DILATION (N/A)  Patient location during evaluation: PACU Anesthesia Type: MAC Level of consciousness: awake and alert Pain management: pain level controlled Vital Signs Assessment: post-procedure vital signs reviewed and stable Respiratory status: spontaneous breathing, nonlabored ventilation, respiratory function stable and patient connected to nasal cannula oxygen Cardiovascular status: stable and blood pressure returned to baseline Anesthetic complications: no       Last Vitals:  Vitals:   04/06/17 1137 04/06/17 1340  BP: (!) 130/97 (!) 120/39  Pulse: 89 94  Resp: 19 (!) 22  Temp: 36.8 C 36.6 C    Last Pain:  Vitals:   04/06/17 1340  TempSrc: Oral                 Lovey Crupi S

## 2017-04-08 ENCOUNTER — Other Ambulatory Visit: Payer: Self-pay

## 2017-04-08 MED ORDER — FLUCONAZOLE 200 MG PO TABS: 200.0000 mg | ORAL_TABLET | ORAL | 0 refills | Status: DC

## 2017-04-13 ENCOUNTER — Inpatient Hospital Stay (HOSPITAL_COMMUNITY)
Admission: EM | Admit: 2017-04-13 | Discharge: 2017-04-16 | DRG: 603 | Disposition: A | Discharge status: Home / Self Care | Payer: Medicare Other | Attending: Internal Medicine | Admitting: Internal Medicine

## 2017-04-13 ENCOUNTER — Other Ambulatory Visit: Payer: Self-pay

## 2017-04-13 ENCOUNTER — Encounter (HOSPITAL_COMMUNITY): Payer: Self-pay

## 2017-04-13 ENCOUNTER — Emergency Department (HOSPITAL_COMMUNITY): Payer: Medicare Other

## 2017-04-13 ENCOUNTER — Telehealth: Payer: Self-pay | Admitting: Gastroenterology

## 2017-04-13 ENCOUNTER — Emergency Department (HOSPITAL_COMMUNITY): Admit: 2017-04-13 | Discharge: 2017-04-13 | Disposition: A | Discharge status: Home / Self Care | Payer: Medicare Other

## 2017-04-13 DIAGNOSIS — E876 Hypokalemia: Secondary | ICD-10-CM | POA: Diagnosis present

## 2017-04-13 DIAGNOSIS — K219 Gastro-esophageal reflux disease without esophagitis: Secondary | ICD-10-CM

## 2017-04-13 DIAGNOSIS — Z888 Allergy status to other drugs, medicaments and biological substances status: Secondary | ICD-10-CM | POA: Diagnosis not present

## 2017-04-13 DIAGNOSIS — M7989 Other specified soft tissue disorders: Secondary | ICD-10-CM | POA: Diagnosis not present

## 2017-04-13 DIAGNOSIS — I2584 Coronary atherosclerosis due to calcified coronary lesion: Secondary | ICD-10-CM

## 2017-04-13 DIAGNOSIS — K222 Esophageal obstruction: Secondary | ICD-10-CM | POA: Diagnosis not present

## 2017-04-13 DIAGNOSIS — I2510 Atherosclerotic heart disease of native coronary artery without angina pectoris: Secondary | ICD-10-CM | POA: Diagnosis present

## 2017-04-13 DIAGNOSIS — Z7982 Long term (current) use of aspirin: Secondary | ICD-10-CM

## 2017-04-13 DIAGNOSIS — Z7951 Long term (current) use of inhaled steroids: Secondary | ICD-10-CM | POA: Diagnosis not present

## 2017-04-13 DIAGNOSIS — M79604 Pain in right leg: Secondary | ICD-10-CM | POA: Diagnosis not present

## 2017-04-13 DIAGNOSIS — Z86010 Personal history of colonic polyps: Secondary | ICD-10-CM | POA: Diagnosis not present

## 2017-04-13 DIAGNOSIS — F17210 Nicotine dependence, cigarettes, uncomplicated: Secondary | ICD-10-CM | POA: Diagnosis present

## 2017-04-13 DIAGNOSIS — E785 Hyperlipidemia, unspecified: Secondary | ICD-10-CM | POA: Diagnosis present

## 2017-04-13 DIAGNOSIS — J449 Chronic obstructive pulmonary disease, unspecified: Secondary | ICD-10-CM | POA: Diagnosis not present

## 2017-04-13 DIAGNOSIS — Z79899 Other long term (current) drug therapy: Secondary | ICD-10-CM | POA: Diagnosis not present

## 2017-04-13 DIAGNOSIS — L03115 Cellulitis of right lower limb: Secondary | ICD-10-CM | POA: Diagnosis not present

## 2017-04-13 DIAGNOSIS — L0390 Cellulitis, unspecified: Secondary | ICD-10-CM

## 2017-04-13 DIAGNOSIS — Z9981 Dependence on supplemental oxygen: Secondary | ICD-10-CM

## 2017-04-13 DIAGNOSIS — D649 Anemia, unspecified: Secondary | ICD-10-CM | POA: Diagnosis present

## 2017-04-13 DIAGNOSIS — Z91018 Allergy to other foods: Secondary | ICD-10-CM

## 2017-04-13 HISTORY — DX: Cutaneous abscess of unspecified foot: L02.619

## 2017-04-13 HISTORY — DX: Cellulitis of unspecified part of limb: L03.119

## 2017-04-13 LAB — COMPREHENSIVE METABOLIC PANEL
ALT: 10 U/L — AB (ref 14–54)
AST: 17 U/L (ref 15–41)
Albumin: 3.5 g/dL (ref 3.5–5.0)
Alkaline Phosphatase: 79 U/L (ref 38–126)
Anion gap: 12 (ref 5–15)
BUN: 15 mg/dL (ref 6–20)
CHLORIDE: 101 mmol/L (ref 101–111)
CO2: 24 mmol/L (ref 22–32)
Calcium: 9.2 mg/dL (ref 8.9–10.3)
Creatinine, Ser: 0.78 mg/dL (ref 0.44–1.00)
Glucose, Bld: 104 mg/dL — ABNORMAL HIGH (ref 65–99)
POTASSIUM: 3.3 mmol/L — AB (ref 3.5–5.1)
SODIUM: 137 mmol/L (ref 135–145)
Total Bilirubin: 0.8 mg/dL (ref 0.3–1.2)
Total Protein: 6.9 g/dL (ref 6.5–8.1)

## 2017-04-13 LAB — CBC WITH DIFFERENTIAL/PLATELET
Basophils Absolute: 0 10*3/uL (ref 0.0–0.1)
Basophils Relative: 0 %
Eosinophils Absolute: 0.2 10*3/uL (ref 0.0–0.7)
Eosinophils Relative: 2 %
HCT: 42.7 % (ref 36.0–46.0)
Hemoglobin: 14.2 g/dL (ref 12.0–15.0)
LYMPHS ABS: 2.7 10*3/uL (ref 0.7–4.0)
LYMPHS PCT: 21 %
MCH: 32.6 pg (ref 26.0–34.0)
MCHC: 33.3 g/dL (ref 30.0–36.0)
MCV: 98.2 fL (ref 78.0–100.0)
MONOS PCT: 6 %
Monocytes Absolute: 0.7 10*3/uL (ref 0.1–1.0)
NEUTROS PCT: 71 %
Neutro Abs: 9.2 10*3/uL — ABNORMAL HIGH (ref 1.7–7.7)
Platelets: 501 10*3/uL — ABNORMAL HIGH (ref 150–400)
RBC: 4.35 MIL/uL (ref 3.87–5.11)
RDW: 14.2 % (ref 11.5–15.5)
WBC: 12.9 10*3/uL — AB (ref 4.0–10.5)

## 2017-04-13 LAB — URINALYSIS, ROUTINE W REFLEX MICROSCOPIC
Bilirubin Urine: NEGATIVE
GLUCOSE, UA: NEGATIVE mg/dL
KETONES UR: 20 mg/dL — AB
Leukocytes, UA: NEGATIVE
NITRITE: NEGATIVE
PROTEIN: 30 mg/dL — AB
Specific Gravity, Urine: 1.025 (ref 1.005–1.030)
pH: 5 (ref 5.0–8.0)

## 2017-04-13 LAB — C-REACTIVE PROTEIN: CRP: 5.3 mg/dL — ABNORMAL HIGH (ref <1.0)

## 2017-04-13 LAB — GLUCOSE, CAPILLARY: Glucose-Capillary: 132 mg/dL — ABNORMAL HIGH (ref 65–99)

## 2017-04-13 LAB — SEDIMENTATION RATE: Sed Rate: 46 mm/hr — ABNORMAL HIGH (ref 0–22)

## 2017-04-13 LAB — I-STAT CG4 LACTIC ACID, ED: LACTIC ACID, VENOUS: 1.64 mmol/L (ref 0.5–1.9)

## 2017-04-13 MED ORDER — ACETAMINOPHEN 650 MG RE SUPP: 650.0000 mg | Freq: Four times a day (QID) | RECTAL | Status: DC | PRN

## 2017-04-13 MED ORDER — PIPERACILLIN-TAZOBACTAM 3.375 G IVPB 30 MIN
3.3750 g | Freq: Once | INTRAVENOUS | Status: AC
  Administered 2017-04-13: 3.375 g via INTRAVENOUS
  Filled 2017-04-13: qty 50

## 2017-04-13 MED ORDER — PIPERACILLIN-TAZOBACTAM 3.375 G IVPB: 3.3750 g | Freq: Three times a day (TID) | INTRAVENOUS | Status: DC

## 2017-04-13 MED ORDER — VANCOMYCIN HCL IN DEXTROSE 1-5 GM/200ML-% IV SOLN: 1000.0000 mg | Freq: Once | INTRAVENOUS | Status: DC

## 2017-04-13 MED ORDER — SODIUM CHLORIDE 0.9 % IV SOLN
1250.0000 mg | Freq: Once | INTRAVENOUS | Status: AC
  Administered 2017-04-13: 1250 mg via INTRAVENOUS
  Filled 2017-04-13: qty 1250

## 2017-04-13 MED ORDER — SODIUM CHLORIDE 0.9 % IV SOLN
INTRAVENOUS | Status: DC
  Administered 2017-04-13 – 2017-04-14 (×2): via INTRAVENOUS

## 2017-04-13 MED ORDER — PANTOPRAZOLE SODIUM 40 MG PO TBEC: 40.0000 mg | DELAYED_RELEASE_TABLET | Freq: Every day | ORAL | Status: DC

## 2017-04-13 MED ORDER — ONDANSETRON HCL 4 MG/2ML IJ SOLN
4.0000 mg | INTRAMUSCULAR | Status: AC
  Administered 2017-04-13: 4 mg via INTRAVENOUS
  Filled 2017-04-13: qty 2

## 2017-04-13 MED ORDER — ONDANSETRON HCL 4 MG/2ML IJ SOLN: 4.0000 mg | Freq: Four times a day (QID) | INTRAMUSCULAR | Status: DC | PRN

## 2017-04-13 MED ORDER — SENNOSIDES 15 MG PO CHEW: 3.0000 | CHEWABLE_TABLET | Freq: Every day | ORAL | Status: DC | PRN

## 2017-04-13 MED ORDER — TRIAMCINOLONE ACETONIDE 55 MCG/ACT NA AERO
2.0000 | INHALATION_SPRAY | Freq: Every day | NASAL | Status: DC | PRN
  Filled 2017-04-13: qty 21.6

## 2017-04-13 MED ORDER — MORPHINE SULFATE (PF) 4 MG/ML IV SOLN
4.0000 mg | Freq: Once | INTRAVENOUS | Status: DC
  Filled 2017-04-13: qty 1

## 2017-04-13 MED ORDER — VANCOMYCIN HCL 10 G IV SOLR: 1250.0000 mg | INTRAVENOUS | Status: DC

## 2017-04-13 MED ORDER — MOMETASONE FURO-FORMOTEROL FUM 200-5 MCG/ACT IN AERO
2.0000 | INHALATION_SPRAY | Freq: Two times a day (BID) | RESPIRATORY_TRACT | Status: DC
  Administered 2017-04-13 – 2017-04-16 (×6): 2 via RESPIRATORY_TRACT
  Filled 2017-04-13: qty 8.8

## 2017-04-13 MED ORDER — ALBUTEROL SULFATE (2.5 MG/3ML) 0.083% IN NEBU
2.5000 mg | INHALATION_SOLUTION | Freq: Four times a day (QID) | RESPIRATORY_TRACT | Status: DC | PRN
  Administered 2017-04-14 – 2017-04-15 (×2): 2.5 mg via RESPIRATORY_TRACT
  Filled 2017-04-13 (×2): qty 3

## 2017-04-13 MED ORDER — ENOXAPARIN SODIUM 40 MG/0.4ML ~~LOC~~ SOLN
40.0000 mg | SUBCUTANEOUS | Status: DC
  Administered 2017-04-14 – 2017-04-15 (×2): 40 mg via SUBCUTANEOUS
  Filled 2017-04-13 (×2): qty 0.4

## 2017-04-13 MED ORDER — POTASSIUM CHLORIDE CRYS ER 20 MEQ PO TBCR
40.0000 meq | EXTENDED_RELEASE_TABLET | Freq: Once | ORAL | Status: AC
  Administered 2017-04-13: 40 meq via ORAL
  Filled 2017-04-13: qty 2

## 2017-04-13 MED ORDER — ONDANSETRON HCL 4 MG PO TABS
4.0000 mg | ORAL_TABLET | Freq: Four times a day (QID) | ORAL | Status: DC | PRN
Start: 2017-04-13 — End: 2017-04-16

## 2017-04-13 MED ORDER — ACETAMINOPHEN 325 MG PO TABS: 650.0000 mg | ORAL_TABLET | Freq: Four times a day (QID) | ORAL | Status: DC | PRN

## 2017-04-13 MED ORDER — SENNA 8.6 MG PO TABS: 1.0000 | ORAL_TABLET | Freq: Every day | ORAL | Status: DC | PRN

## 2017-04-13 MED ORDER — ASPIRIN EC 81 MG PO TBEC: 81.0000 mg | DELAYED_RELEASE_TABLET | Freq: Every day | ORAL | Status: DC

## 2017-04-13 MED ORDER — PIPERACILLIN-TAZOBACTAM 3.375 G IVPB 30 MIN: 3.3750 g | Freq: Once | INTRAVENOUS | Status: DC

## 2017-04-13 MED ORDER — SUCRALFATE 1 GM/10ML PO SUSP
1.0000 g | Freq: Four times a day (QID) | ORAL | Status: DC
  Filled 2017-04-13 (×4): qty 10

## 2017-04-13 MED ORDER — UMECLIDINIUM BROMIDE 62.5 MCG/INH IN AEPB
1.0000 | INHALATION_SPRAY | Freq: Every day | RESPIRATORY_TRACT | Status: DC
  Administered 2017-04-14 – 2017-04-16 (×3): 1 via RESPIRATORY_TRACT
  Filled 2017-04-13: qty 7

## 2017-04-13 MED ORDER — ENOXAPARIN SODIUM 30 MG/0.3ML ~~LOC~~ SOLN
30.0000 mg | SUBCUTANEOUS | Status: DC
  Administered 2017-04-13: 30 mg via SUBCUTANEOUS
  Filled 2017-04-13: qty 0.3

## 2017-04-13 MED ORDER — CLINDAMYCIN PHOSPHATE 600 MG/50ML IV SOLN
600.0000 mg | Freq: Three times a day (TID) | INTRAVENOUS | Status: DC
  Administered 2017-04-13 – 2017-04-16 (×9): 600 mg via INTRAVENOUS
  Filled 2017-04-13 (×10): qty 50

## 2017-04-13 NOTE — ED Triage Notes (Signed)
Per Pt, Pt is coming from home with complaints of right foot swelling and redness x 2 weeks. Pt has been on antibiotics for nine days with no relief.

## 2017-04-13 NOTE — ED Notes (Signed)
Patient placed on 2L O2 

## 2017-04-13 NOTE — Progress Notes (Signed)
**  Preliminary report by tech**  Right lower extremity venous duplex complete. There is no evidence of deep or superficial vein thrombosis involving the right lower extremity. All visualized vessels appear patent and compressible. There is no evidence of a Baker's cyst on the right. Results were given to the patient's nurse, Alecia.   04/13/17 10:35 AM Valerie Gay RVT

## 2017-04-13 NOTE — Progress Notes (Signed)
Pharmacy Antibiotic Note  Valerie Gay is a 76 y.o. female admitted on 04/13/2017 with cellulitis.  Pharmacy has been consulted for vancomycin/zosyn dosing. On Keflex x 9 days PTA. Afebrile, WBC 12.9, LA 1.64. SCr 0.78 on admit, CrCl~52.  Plan: Zosyn 3.375g IV (13min infusion) x1; then 3.375g IV q8h (4h infusion) Vancomycin 1250mg  IV x1; then 1250mg  IV q24h. Goal trough 10-15 Monitor clinical progress, c/s, renal function F/u de-escalation plan/LOT, vancomycin trough as indicated   Height: 5' 2.5" (158.8 cm) Weight: 145 lb (65.8 kg) IBW/kg (Calculated) : 51.25  Temp (24hrs), Avg:97.8 F (36.6 C), Min:97.8 F (36.6 C), Max:97.8 F (36.6 C)  No results for input(s): WBC, CREATININE, LATICACIDVEN, VANCOTROUGH, VANCOPEAK, VANCORANDOM, GENTTROUGH, GENTPEAK, GENTRANDOM, TOBRATROUGH, TOBRAPEAK, TOBRARND, AMIKACINPEAK, AMIKACINTROU, AMIKACIN in the last 168 hours.  CrCl cannot be calculated (Patient's most recent lab result is older than the maximum 21 days allowed.).    Allergies  Allergen Reactions  . Spiriva Handihaler [Tiotropium Bromide Monohydrate] Hives, Itching and Rash     on upper body  . Banana Itching    Antimicrobials this admission: 5/29 vancomycin >>  5/29 zosyn >>   Dose adjustments this admission:   Microbiology results: 5/29 BCx:  Elicia Lamp, PharmD, BCPS Clinical Pharmacist 04/13/2017 9:03 AM

## 2017-04-13 NOTE — ED Notes (Signed)
Pt returned to room from xray.

## 2017-04-13 NOTE — ED Notes (Signed)
Patient to xray.

## 2017-04-13 NOTE — ED Provider Notes (Signed)
[facility] DEPT Provider Note   CSN: [id number] Arrival date & time: 04/13/17  0825     History   Chief Complaint Chief Complaint  Patient presents with  . Foot Swelling    HPI Valerie Gay is a 76 y.o. female with a past medical history of COPD, hyperlipidemia, who presents emergency Department with chief complaint of right foot swelling and pain. Patient states that this developed about 2 weeks ago. 10 days ago. She saw her PCP who started her on Keflex, which she has been taking as prescribed. She was supposed to follow-up that following Monday. However, developed an esophageal impaction and states "I had to get that taken care of immediately." She was unable to follow up with her PCP. She came to the emergency department for reevaluation today. Her pain has been worsening. She has had swelling up into the calf. She states her pain is worse in the top of her foot. There is an ulceration on the top, which she states has had copious clear drainage. She denies fevers or chills. She uses oxygen at home but only as needed.  HPI  Past Medical History:  Diagnosis Date  . Anemia age 7  . Colon polyps    2010   . COPD (chronic obstructive pulmonary disease) ([place])   . Diverticulosis   . Hyperlipidemia     Patient Active Problem List   Diagnosis Date Noted  . Cellulitis 04/13/2017  . GERD (gastroesophageal reflux disease) 04/13/2017  . Cellulitis of right foot   . Esophageal stricture   . Dysphagia   . Abnormal barium swallow   . History of colonic polyps   . Polyp of ascending colon   . Polyp of transverse colon   . Acute URI 12/13/2016  . COPD with acute exacerbation ([place]) 12/13/2016  . Coronary artery calcification 10/23/2016  . Hyperlipidemia LDL goal <100 10/23/2016  . Medication management 10/23/2016  . DOE (dyspnea on exertion) 10/23/2016  . Chest pain with moderate risk for cardiac etiology 10/23/2016  . Lung nodule 02/26/2015  . Nodule of right lung  11/28/2014  . Acute calculous cholecystitis 06/22/2014  . Acute cholecystitis 06/22/2014  . Pre-operative respiratory examination 06/22/2014  . Abnormal chest x-ray 05/28/2014  . COPD, moderate ([place]) 05/07/2014  . Acute on chronic respiratory failure ([place]) 03/27/2014  . COPD exacerbation ([place]) 03/27/2014  . CAP (community acquired pneumonia) 03/27/2014  . Nicotine abuse 03/27/2014  . Hypokalemia 03/27/2014  . Leukocytosis 03/27/2014  . TOBACCO USE 11/05/2008  . COPD 11/05/2008  . HYPERGLYCEMIA 11/05/2008  . ANEMIA-NOS 07/21/2007    Past Surgical History:  Procedure Laterality Date  . BALLOON DILATION N/A 03/08/2017   Procedure: BALLOON DILATION;  Surgeon: Manus Gunning, MD;  Location: [facility];  Service: Gastroenterology;  Laterality: N/A;  . CATARACT EXTRACTION Right    August 15, 2013  . CHOLECYSTECTOMY N/A 06/22/2014   Procedure: LAPAROSCOPIC CHOLECYSTECTOMY WITH INTRAOPERATIVE CHOLANGIOGRAM;  Surgeon: Gayland Curry, MD;  Location: [facility];  Service: General;  Laterality: N/A;  . COLONOSCOPY N/A 02/19/2017   Procedure: COLONOSCOPY;  Surgeon: Manus Gunning, MD;  Location: [facility];  Service: Gastroenterology;  Laterality: N/A;  . ESOPHAGOGASTRODUODENOSCOPY N/A 02/19/2017   Procedure: ESOPHAGOGASTRODUODENOSCOPY (EGD);  Surgeon: Manus Gunning, MD;  Location: [facility];  Service: Gastroenterology;  Laterality: N/A;  . ESOPHAGOGASTRODUODENOSCOPY N/A 03/08/2017   Procedure: ESOPHAGOGASTRODUODENOSCOPY (EGD);  Surgeon: Manus Gunning, MD;  Location: [facility];  Service: Gastroenterology;  Laterality: N/A;  . ESOPHAGOGASTRODUODENOSCOPY N/A 04/06/2017  Procedure: ESOPHAGOGASTRODUODENOSCOPY (EGD);  Surgeon: Manus Gunning, MD;  Location: [facility];  Service: Gastroenterology;  Laterality: N/A;  . NM MYOVIEW LTD  10/2016    EF 55-65% with normal LV function. LOW RISK. No ischemia or infarction.  . TONSILLECTOMY      OB History    No  data available       Home Medications    Prior to Admission medications   Medication Sig Start Date End Date Taking? Authorizing Provider  albuterol (PROVENTIL HFA;VENTOLIN HFA) 108 (90 Base) MCG/ACT inhaler Inhale 2 puffs into the lungs every 6 (six) hours as needed for wheezing or shortness of breath. 03/05/17  Yes Brand Males, MD  albuterol (PROVENTIL) (2.5 MG/3ML) 0.083% nebulizer solution Take 3 mLs (2.5 mg total) by nebulization every 6 (six) hours as needed for wheezing or shortness of breath. DJ: J44.9 03/05/17  Yes Brand Males, MD  cephALEXin (KEFLEX) 250 MG/5ML suspension Take 500 mg by mouth 2 (two) times daily. Started on 04-06-17 x 10 day therapy. 04/06/17  Yes [provider]  fluconazole (DIFLUCAN) 200 MG tablet Take 1 tablet (200 mg total) by mouth as directed. Take 2 tablets on 1st day, then one tablet daily until gone. 04/08/17  Yes Armbruster, Renelda Loma, MD  INCRUSE ELLIPTA 62.5 MCG/INH AEPB USE 1 INHALATION DAILY Patient taking differently: USE 1 INHALATION DAILY AT 3PM 10/30/16  Yes Brand Males, MD  nystatin (MYCOSTATIN/NYSTOP) powder Apply topically 2 (two) times daily. Patient taking differently: Apply 1 g topically daily as needed (fungus).  12/16/16  Yes [name], MD  OXYGEN Inhale 2 L into the lungs as needed (shortness of breath).   Yes [provider]  Sennosides (EX-LAX) 15 MG CHEW Chew 3 tablets by mouth daily as needed.   Yes [provider]  sucralfate (CARAFATE) 1 GM/10ML suspension Take 10 mLs (1 g total) by mouth 4 (four) times daily. 04/06/17 04/06/18 Yes Armbruster, Renelda Loma, MD  SYMBICORT 160-4.5 MCG/ACT inhaler USE 2 INHALATIONS TWICE A DAY 09/09/16  Yes Brand Males, MD  Triamcinolone Acetonide (NASACORT ALLERGY 24HR NA) Place 1 spray into the nose daily as needed (allergies).    Yes [provider]  aspirin EC 81 MG tablet Take 81 mg by mouth at bedtime.    [provider]  omeprazole  (PRILOSEC) 20 MG capsule Take 20 mg by mouth daily. 04/04/17   [provider]    Family History Family History  Problem Relation Age of Onset  . Alzheimer's disease Mother   . Diabetes Mother   . Cancer Father     Social History Social History  Substance Use Topics  . Smoking status: Current Every Day Smoker    Packs/day: 0.50    Years: 60.00    Types: Cigarettes  . Smokeless tobacco: Never Used     Comment: 1/2 ppd 11.20.17  . Alcohol use No     Allergies   Spiriva handihaler [tiotropium bromide monohydrate] and Banana   Review of Systems Review of Systems  Ten systems reviewed and are negative for acute change, except as noted in the HPI.   Physical Exam Updated Vital Signs BP (!) 115/46 (BP Location: Left Arm)   Pulse 68   Temp 98 F (36.7 C) (Oral)   Resp 19   Ht 5' 2.5" (1.588 m)   Wt 64.1 kg (141 lb 4.8 oz)   SpO2 100%   BMI 25.43 kg/m   Physical Exam  Constitutional: She is oriented to person, place,  and time. She appears well-developed and well-nourished. No distress.  HENT:  Head: Normocephalic and atraumatic.  Eyes: Conjunctivae are normal. No scleral icterus.  Neck: Normal range of motion.  Cardiovascular: Normal rate, regular rhythm and normal heart sounds.  Exam reveals no gallop and no friction rub.   No murmur heard. Pulmonary/Chest: Effort normal and breath sounds normal. No respiratory distress.  Abdominal: Soft. Bowel sounds are normal. She exhibits no distension and no mass. There is no tenderness. There is no guarding.  Musculoskeletal:  Right lower extremity with significant swelling, pitting edema 2-3+. The right foot is erythematous, markedly swollen, exquisitely tender to palpation along the dorsum of the foot. There is a 2 cm circular ulceration on the distal, lateral dorsum of her foot. No purulence. However, there is serous drainage. The toes are blanched with decreased capillary refill to 5 or 6 seconds. There is a strong,  bounding dorsalis pedis pulse. I am unable to palpate the posterior tibialis pulse..  Feet:  Right Foot:  Skin Integrity: Positive for ulcer, erythema and warmth.  Neurological: She is alert and oriented to person, place, and time.  Skin: Skin is warm and dry. She is not diaphoretic.  Psychiatric: Her behavior is normal.  Nursing note and vitals reviewed.          ED Treatments / Results  Labs (all labs ordered are listed, but only abnormal results are displayed) Labs Reviewed  COMPREHENSIVE METABOLIC PANEL - Abnormal; Notable for the following:       Result Value   Potassium 3.3 (*)    Glucose, Bld 104 (*)    ALT 10 (*)    All other components within normal limits  CBC WITH DIFFERENTIAL/PLATELET - Abnormal; Notable for the following:    WBC 12.9 (*)    Platelets 501 (*)    Neutro Abs 9.2 (*)    All other components within normal limits  URINALYSIS, ROUTINE W REFLEX MICROSCOPIC - Abnormal; Notable for the following:    APPearance HAZY (*)    Hgb urine dipstick SMALL (*)    Ketones, ur 20 (*)    Protein, ur 30 (*)    Bacteria, UA RARE (*)    Squamous Epithelial / LPF 6-30 (*)    All other components within normal limits  SEDIMENTATION RATE - Abnormal; Notable for the following:    Sed Rate 46 (*)    All other components within normal limits  C-REACTIVE PROTEIN - Abnormal; Notable for the following:    CRP 5.3 (*)    All other components within normal limits  CULTURE, BLOOD (ROUTINE X 2)  CULTURE, BLOOD (ROUTINE X 2)  I-STAT CG4 LACTIC ACID, ED    EKG  EKG Interpretation  Date/Time:  Tuesday Apr 13 2017 09:34:54 EDT Ventricular Rate:  89 PR Interval:    QRS Duration: 85 QT Interval:  373 QTC Calculation: 454 R Axis:   75 Text Interpretation:  Sinus rhythm Low voltage with right axis deviation Abnormal R-wave progression, early transition Confirmed by Alvino Chapel  MD, Ovid Curd [phone]) on 04/13/2017 10:01:39 AM       Radiology Dg Foot 2 Views Right  Result  Date: 04/13/2017 CLINICAL DATA:  Two weeks of swelling and erythema of the foot. The patient has a suspected insect bite over the fourth metatarsal region. EXAM: RIGHT FOOT - 2 VIEW COMPARISON:  None in PACs FINDINGS: The bones are subjectively adequately mineralized. The joint spaces are reasonably well maintained for age. No lytic or blastic lesions  of the bones are observed. There is a large amount of soft tissue swelling over the dorsum of the midfoot. There may be a small amount of soft tissue gas. No foreign bodies are observed. IMPRESSION: Findings compatible with cellulitis of the dorsum of the right foot. No radiographic evidence of acute osteomyelitis. Electronically Signed   By: David  Martinique M.D.   On: 04/13/2017 10:11    Procedures Procedures (including critical care time)  Medications Ordered in ED Medications  morphine 4 MG/ML injection 4 mg (4 mg Intravenous Not Given 04/13/17 0931)  sucralfate (CARAFATE) 1 GM/10ML suspension 1 g (not administered)  triamcinolone (NASACORT) nasal inhaler 2 spray (not administered)  albuterol (PROVENTIL) (2.5 MG/3ML) 0.083% nebulizer solution 2.5 mg (not administered)  umeclidinium bromide (INCRUSE ELLIPTA) 62.5 MCG/INH 1 puff (1 puff Inhalation Not Given 04/13/17 1348)  mometasone-formoterol (DULERA) 200-5 MCG/ACT inhaler 2 puff (2 puffs Inhalation Not Given 04/13/17 1348)  enoxaparin (LOVENOX) injection 30 mg (not administered)  clindamycin (CLEOCIN) IVPB 600 mg (not administered)  0.9 %  sodium chloride infusion (not administered)  acetaminophen (TYLENOL) tablet 650 mg (not administered)    Or  acetaminophen (TYLENOL) suppository 650 mg (not administered)  ondansetron (ZOFRAN) tablet 4 mg (not administered)    Or  ondansetron (ZOFRAN) injection 4 mg (not administered)  potassium chloride SA (K-DUR,KLOR-CON) CR tablet 40 mEq (not administered)  senna (SENOKOT) tablet 8.6 mg (not administered)  piperacillin-tazobactam (ZOSYN) IVPB 3.375 g (0 g  Intravenous Stopped 04/13/17 1100)  vancomycin (VANCOCIN) 1,250 mg in sodium chloride 0.9 % 250 mL IVPB (0 mg Intravenous Stopped 04/13/17 1216)  ondansetron (ZOFRAN) injection 4 mg (4 mg Intravenous Given 04/13/17 0930)     Initial Impression / Assessment and Plan / ED Course  I have reviewed the triage vital signs and the nursing notes.  Pertinent labs & imaging results that were available during my care of the patient were reviewed by me and considered in my medical decision making (see chart for details).  Clinical Course as of Apr 13 1625  Tue Apr 13, 2017  0914 This is a 76 year old female with COPD and a long-standing history of smoking. The patient has failed outpatient antibiotics on Keflex with a presumed cellulitis. However, I have concern for ischemia, osteomyelitis, potential DVT of the right lower extremity. The patient is tachycardic and her initial evaluation. This may be secondary to her effort in getting dressed and on the bed, or secondary to her pain. I have an EKG pending. She is afebrile and hemodynamically stable at this time, we will evaluate for potential sepsis with continued monitoring, labs. Pleuritic cultures are drawn. I began the patient on vancomycin and Zosyn.  [AH]    Clinical Course User Index [AH] Margarita Mail, PA-C   Patient with apparent cellulitis of the right foot. She has failed outpatient antibiotics. Patient will need admission. She is agreeable to plan of care.  Final Clinical Impressions(s) / ED Diagnoses   Final diagnoses:  Cellulitis  Foot swelling    New Prescriptions Current Discharge Medication List       Margarita Mail, PA-C 04/13/17 1626    Davonna Belling, MD 04/18/17 1556

## 2017-04-13 NOTE — H&P (Addendum)
History and Physical    Valerie Gay OVF:[id number] DOB: Nov 26, 1940 DOA: 04/13/2017  PCP: Marda Stalker, PA-C Patient coming from: Home  Chief Complaint: R foot pain  HPI: Valerie Gay is a 76 y.o. female with medical history significant of anemia, COPD, diverticulosis, hyperlipidemia. Patient presenting with multi week history of right foot swelling and pain. Started on Keflex approximately 10 days ago without improvement. In fact patient states that pain and swelling has gotten worse over the last couple of days. Swelling has now progressed from the foot to the level of the ankle and calf. Small area of ulceration noted on the top of the foot with clear drainage which has developed over the last couple of days. Denies fevers, chills, chest pain, shortness of breath, palpitations, nausea, vomiting, dumping, dysuria, frequency, back pain, neck stiffness, headache, LOC.  Denies any symptoms of claudication or cool feeling toes. Denies previous similar infections. States that all of this may started as a spider bite.   ED Course: Start on vancomycin and Zosyn. Objective findings outlined below.  Review of Systems: As per HPI otherwise all other systems reviewed and are negative  Ambulatory Status: No restrictions.  Past Medical History:  Diagnosis Date  . Anemia age 53  . Colon polyps    2010   . COPD (chronic obstructive pulmonary disease) ([place])   . Diverticulosis   . Hyperlipidemia     Past Surgical History:  Procedure Laterality Date  . BALLOON DILATION N/A 03/08/2017   Procedure: BALLOON DILATION;  Surgeon: Manus Gunning, MD;  Location: [facility];  Service: Gastroenterology;  Laterality: N/A;  . CATARACT EXTRACTION Right    August 15, 2013  . CHOLECYSTECTOMY N/A 06/22/2014   Procedure: LAPAROSCOPIC CHOLECYSTECTOMY WITH INTRAOPERATIVE CHOLANGIOGRAM;  Surgeon: Gayland Curry, MD;  Location: [facility];  Service: General;  Laterality: N/A;  . COLONOSCOPY N/A  02/19/2017   Procedure: COLONOSCOPY;  Surgeon: Manus Gunning, MD;  Location: [facility];  Service: Gastroenterology;  Laterality: N/A;  . ESOPHAGOGASTRODUODENOSCOPY N/A 02/19/2017   Procedure: ESOPHAGOGASTRODUODENOSCOPY (EGD);  Surgeon: Manus Gunning, MD;  Location: [facility];  Service: Gastroenterology;  Laterality: N/A;  . ESOPHAGOGASTRODUODENOSCOPY N/A 03/08/2017   Procedure: ESOPHAGOGASTRODUODENOSCOPY (EGD);  Surgeon: Manus Gunning, MD;  Location: [facility];  Service: Gastroenterology;  Laterality: N/A;  . ESOPHAGOGASTRODUODENOSCOPY N/A 04/06/2017   Procedure: ESOPHAGOGASTRODUODENOSCOPY (EGD);  Surgeon: Manus Gunning, MD;  Location: [facility];  Service: Gastroenterology;  Laterality: N/A;  . NM MYOVIEW LTD  10/2016    EF 55-65% with normal LV function. LOW RISK. No ischemia or infarction.  . TONSILLECTOMY      Social History   Social History  . Marital status: Divorced    Spouse name: N/A  . Number of children: N/A  . Years of education: N/A   Occupational History  . retired    Social History Main Topics  . Smoking status: Current Every Day Smoker    Packs/day: 0.50    Years: 60.00    Types: Cigarettes  . Smokeless tobacco: Never Used     Comment: 1/2 ppd 11.20.17  . Alcohol use No  . Drug use: No  . Sexual activity: No   Other Topics Concern  . Not on file   Social History Narrative  . No narrative on file    Allergies  Allergen Reactions  . Spiriva Handihaler [Tiotropium Bromide Monohydrate] Hives, Itching and Rash     on upper body  . Banana Itching    Family  History  Problem Relation Age of Onset  . Alzheimer's disease Mother   . Diabetes Mother   . Cancer Father     Prior to Admission medications   Medication Sig Start Date End Date Taking? Authorizing Provider  albuterol (PROVENTIL HFA;VENTOLIN HFA) 108 (90 Base) MCG/ACT inhaler Inhale 2 puffs into the lungs every 6 (six) hours as needed for wheezing or  shortness of breath. 03/05/17  Yes Brand Males, MD  albuterol (PROVENTIL) (2.5 MG/3ML) 0.083% nebulizer solution Take 3 mLs (2.5 mg total) by nebulization every 6 (six) hours as needed for wheezing or shortness of breath. DJ: J44.9 03/05/17  Yes Brand Males, MD  cephALEXin (KEFLEX) 250 MG/5ML suspension Take 500 mg by mouth 2 (two) times daily. Started on 04-06-17 x 10 day therapy. 04/06/17  Yes [provider]  fluconazole (DIFLUCAN) 200 MG tablet Take 1 tablet (200 mg total) by mouth as directed. Take 2 tablets on 1st day, then one tablet daily until gone. 04/08/17  Yes Armbruster, Renelda Loma, MD  INCRUSE ELLIPTA 62.5 MCG/INH AEPB USE 1 INHALATION DAILY Patient taking differently: USE 1 INHALATION DAILY AT 3PM 10/30/16  Yes Brand Males, MD  nystatin (MYCOSTATIN/NYSTOP) powder Apply topically 2 (two) times daily. Patient taking differently: Apply 1 g topically daily as needed (fungus).  12/16/16  Yes [name], MD  OXYGEN Inhale 2 L into the lungs as needed (shortness of breath).   Yes [provider]  Sennosides (EX-LAX) 15 MG CHEW Chew 3 tablets by mouth daily as needed.   Yes [provider]  sucralfate (CARAFATE) 1 GM/10ML suspension Take 10 mLs (1 g total) by mouth 4 (four) times daily. 04/06/17 04/06/18 Yes Armbruster, Renelda Loma, MD  SYMBICORT 160-4.5 MCG/ACT inhaler USE 2 INHALATIONS TWICE A DAY 09/09/16  Yes Brand Males, MD  Triamcinolone Acetonide (NASACORT ALLERGY 24HR NA) Place 1 spray into the nose daily as needed (allergies).    Yes [provider]  aspirin EC 81 MG tablet Take 81 mg by mouth at bedtime.    [provider]  omeprazole (PRILOSEC) 20 MG capsule Take 20 mg by mouth daily. 04/04/17   [provider]    Physical Exam: Vitals:   04/13/17 0836 04/13/17 0900 04/13/17 1030  BP: 122/68 126/65 (!) 123/59  Pulse: (!) 125 95 85  Resp: 18  (!) 25  Temp: 97.8 F (36.6 C)    TempSrc: Oral    SpO2: 96%  95% 97%  Weight: 65.8 kg (145 lb)    Height: 5' 2.5" (1.588 m)       General:  Appears calm and comfortable Eyes:  PERRL, EOMI, normal lids, iris ENT:  grossly normal hearing, lips & tongue, mmm Neck:  no LAD, masses or thyromegaly Cardiovascular:  RRR, no m/r/g. No LE edema.  Respiratory:  CTA bilaterally, no w/r/r. Normal respiratory effort. Abdomen:  soft, ntnd, NABS Skin:  Right foot with marked erythema and pitting edema over the dorsum and lateral aspect of the foot. Small 1 cm area of ulceration along the right dorsum midfoot. Musculoskeletal:  grossly normal tone BUE/BLE, good ROM, no bony abnormality Psychiatric:  grossly normal mood and affect, speech fluent and appropriate, AOx3 Neurologic:  CN 2-12 grossly intact, moves all extremities in coordinated fashion, sensation intact  Labs on Admission: I have personally reviewed following labs and imaging studies  CBC:  Recent Labs Lab 04/13/17 0903  WBC 12.9*  NEUTROABS 9.2*  HGB 14.2  HCT 42.7  MCV 98.2  PLT 501*  Basic Metabolic Panel:  Recent Labs Lab 04/13/17 0903  NA 137  K 3.3*  CL 101  CO2 24  GLUCOSE 104*  BUN 15  CREATININE 0.78  CALCIUM 9.2   GFR: Estimated Creatinine Clearance: 52.2 mL/min (by C-G formula based on SCr of 0.78 mg/dL). Liver Function Tests:  Recent Labs Lab 04/13/17 0903  AST 17  ALT 10*  ALKPHOS 79  BILITOT 0.8  PROT 6.9  ALBUMIN 3.5   No results for input(s): LIPASE, AMYLASE in the last 168 hours. No results for input(s): AMMONIA in the last 168 hours. Coagulation Profile: No results for input(s): INR, PROTIME in the last 168 hours. Cardiac Enzymes: No results for input(s): CKTOTAL, CKMB, CKMBINDEX, TROPONINI in the last 168 hours. BNP (last 3 results) No results for input(s): PROBNP in the last 8760 hours. HbA1C: No results for input(s): HGBA1C in the last 72 hours. CBG: No results for input(s): GLUCAP in the last 168 hours. Lipid Profile: No results for  input(s): CHOL, HDL, LDLCALC, TRIG, CHOLHDL, LDLDIRECT in the last 72 hours. Thyroid Function Tests: No results for input(s): TSH, T4TOTAL, FREET4, T3FREE, THYROIDAB in the last 72 hours. Anemia Panel: No results for input(s): VITAMINB12, FOLATE, FERRITIN, TIBC, IRON, RETICCTPCT in the last 72 hours. Urine analysis:    Component Value Date/Time   COLORURINE YELLOW 12/13/2016 1944   APPEARANCEUR HAZY (A) 12/13/2016 1944   LABSPEC 1.016 12/13/2016 1944   PHURINE 5.0 12/13/2016 1944   GLUCOSEU 50 (A) 12/13/2016 1944   HGBUR NEGATIVE 12/13/2016 1944   BILIRUBINUR NEGATIVE 12/13/2016 1944   KETONESUR 5 (A) 12/13/2016 1944   PROTEINUR 30 (A) 12/13/2016 1944   UROBILINOGEN 1.0 03/29/2014 1032   NITRITE NEGATIVE 12/13/2016 1944   LEUKOCYTESUR NEGATIVE 12/13/2016 1944    Creatinine Clearance: Estimated Creatinine Clearance: 52.2 mL/min (by C-G formula based on SCr of 0.78 mg/dL).  Sepsis Labs: @LABRCNTIP (procalcitonin:4,lacticidven:4) )No results found for this or any previous visit (from the past 240 hour(s)).   Radiological Exams on Admission: Dg Foot 2 Views Right  Result Date: 04/13/2017 CLINICAL DATA:  Two weeks of swelling and erythema of the foot. The patient has a suspected insect bite over the fourth metatarsal region. EXAM: RIGHT FOOT - 2 VIEW COMPARISON:  None in PACs FINDINGS: The bones are subjectively adequately mineralized. The joint spaces are reasonably well maintained for age. No lytic or blastic lesions of the bones are observed. There is a large amount of soft tissue swelling over the dorsum of the midfoot. There may be a small amount of soft tissue gas. No foreign bodies are observed. IMPRESSION: Findings compatible with cellulitis of the dorsum of the right foot. No radiographic evidence of acute osteomyelitis. Electronically Signed   By: Shadiyah Wernli  Martinique M.D.   On: 04/13/2017 10:11    EKG: Independently reviewed. Sinus. No ACS  Assessment/Plan Active Problems:    Hypokalemia   COPD, moderate (HCC)   Coronary artery calcification   Esophageal stricture   Cellulitis   GERD (gastroesophageal reflux disease)    Cellulitis: Failed outpatient therapy after 10 day course of Keflex. Likely to need at least 24 hrs IV ABX w/ transition to PO and showing continued improvement prior to DC. RLE swelling likely edema from infection. Vascular US in ED w/o evidence of DVT. - Cellulitis order set utilized - IV clinda - f/u BCX  Hypokalemia: 3.3 on admission - Kdur 5mEq x1 - BMET and Mag in am  Esophageal stricture: Recurrent problem for patient. Patient treated on 04/06/2017 by  Dr. Havery Moros for distal esophageal food impaction with ulcerated esophageal stenosis. No competition since this time. - Regular diet - GI consult if needed  COPD: at baseline - O2 PRN - Continue Incruse Ellipta, Dulera, albuterol  CAD: - Continue ASA  GERD: - continue ppi    DVT prophylaxis: Lovenox  Code Status: full  Family Communication: none  Disposition Plan: pending improvement in cellulitis on IV and then PO ABX  Consults called: none  Admission status: inpt    Lavonda Thal J MD [facility]  If 7PM-7AM, please contact night-coverage [email or web address] Password [facility]  04/13/2017, 11:33 AM

## 2017-04-13 NOTE — Telephone Encounter (Signed)
FYI, pre-visit cancelled for today. ED told her that she most likely will be staying re: foot infection. EGD is scheduled at hospital on 6/12. Asked that patient's daughter keep [place] posted on her release, possibly rescheduling pre-visit.

## 2017-04-13 NOTE — ED Notes (Signed)
Meal given

## 2017-04-13 NOTE — ED Notes (Signed)
EDP at bedside  

## 2017-04-14 DIAGNOSIS — L03115 Cellulitis of right lower limb: Principal | ICD-10-CM

## 2017-04-14 DIAGNOSIS — E876 Hypokalemia: Secondary | ICD-10-CM

## 2017-04-14 LAB — BASIC METABOLIC PANEL
ANION GAP: 6 (ref 5–15)
BUN: 10 mg/dL (ref 6–20)
CHLORIDE: 105 mmol/L (ref 101–111)
CO2: 30 mmol/L (ref 22–32)
Calcium: 8.1 mg/dL — ABNORMAL LOW (ref 8.9–10.3)
Creatinine, Ser: 0.71 mg/dL (ref 0.44–1.00)
GFR calc Af Amer: >60 mL/min (ref >60)
GFR calc non Af Amer: >60 mL/min (ref >60)
GLUCOSE: 111 mg/dL — AB (ref 65–99)
POTASSIUM: 3 mmol/L — AB (ref 3.5–5.1)
Sodium: 141 mmol/L (ref 135–145)

## 2017-04-14 LAB — CBC
HEMATOCRIT: 34.8 % — AB (ref 36.0–46.0)
HEMOGLOBIN: 10.6 g/dL — AB (ref 12.0–15.0)
MCH: 29.9 pg (ref 26.0–34.0)
MCHC: 30.5 g/dL (ref 30.0–36.0)
MCV: 98 fL (ref 78.0–100.0)
Platelets: 414 10*3/uL — ABNORMAL HIGH (ref 150–400)
RBC: 3.55 MIL/uL — AB (ref 3.87–5.11)
RDW: 14.1 % (ref 11.5–15.5)
WBC: 8.9 10*3/uL (ref 4.0–10.5)

## 2017-04-14 LAB — MAGNESIUM: Magnesium: 1.9 mg/dL (ref 1.7–2.4)

## 2017-04-14 MED ORDER — POTASSIUM CHLORIDE CRYS ER 20 MEQ PO TBCR
40.0000 meq | EXTENDED_RELEASE_TABLET | ORAL | Status: AC
  Administered 2017-04-14 (×2): 40 meq via ORAL
  Filled 2017-04-14 (×2): qty 2

## 2017-04-14 MED ORDER — FLUCONAZOLE 100 MG PO TABS
100.0000 mg | ORAL_TABLET | Freq: Every day | ORAL | Status: DC
  Administered 2017-04-14 – 2017-04-15 (×2): 100 mg via ORAL
  Filled 2017-04-14 (×2): qty 1

## 2017-04-14 NOTE — Telephone Encounter (Signed)
Okay thanks. Hopefully she can still make the EGD on 6/12 as scheduled. Please let me know if not. Thanks

## 2017-04-14 NOTE — Progress Notes (Signed)
PROGRESS NOTE   Valerie Gay  ZOX:[id number]    DOB: Oct 15, 1941    DOA: 04/13/2017  PCP: Marda Stalker, PA-C   I have briefly reviewed patients previous medical records in [facility].  Brief Narrative:  76 year old female with PMH of anemia, COPD, diverticulosis, HLD, presented with right foot swelling and pain, no significant improvement despite 10 days course of Keflex, admitted for cellulitis that failed outpatient treatment.   Assessment & Plan:   Active Problems:   Hypokalemia   COPD, moderate (HCC)   Coronary artery calcification   Esophageal stricture   Cellulitis   GERD (gastroesophageal reflux disease)   1. Right leg/foot cellulitis: Failed outpatient treatment with Keflex 10 days. No real history of insect bites. Started on IV clindamycin, continue. Vascular ultrasound in ED without evidence of DVT. Counseled patient regarding elevation of extremity to reduce swelling. She verbalized understanding. Blood cultures 2: Negative to date. X-ray without osteomyelitis. 2. Hypokalemia: Replace aggressively and follow. Magnesium 1.9. 3. Esophageal stricture, recurrent: Patient has scheduled EGD appointment for 6/12. 4. COPD: Stable without clinical bronchospasm. 5. CAD: Asymptomatic of chest pain continued continue aspirin. 6. GERD: Continue PPI. 7. Anemia: Follow CBCs.   DVT prophylaxis: Lovenox Code Status: Full Family Communication: None at bedside Disposition: DC home when medically improved   Consultants:  None   Procedures:  None  Antimicrobials:  IV clindamycin    Subjective: Reports no significant improvement since admission. Persisting swelling and mild redness and not much pain. No drainage since admission. No definitive history regarding insect bites.  ROS: No fever or chills.  Objective:  Vitals:   04/13/17 2004 04/13/17 2200 04/14/17 0500 04/14/17 0918  BP:  (!) 99/40 (!) 120/50   Pulse:  66 66   Resp:  16 20   Temp:  98.4 F  (36.9 C) 97.7 F (36.5 C)   TempSrc:  Oral Oral   SpO2: 99% 92% 99% 99%  Weight:      Height:        Examination:  General exam: Pleasant elderly female lying comfortably supine in bed. Does not appear septic or toxic. Respiratory system: Clear to auscultation. Respiratory effort normal. Cardiovascular system: S1 & S2 heard, RRR. No JVD, murmurs, rubs, gallops or clicks. No pedal edema. Gastrointestinal system: Abdomen is nondistended, soft and nontender. No organomegaly or masses felt. Normal bowel sounds heard. Central nervous system: Alert and oriented. No focal neurological deficits. Extremities: Symmetric 5 x 5 power. Right dorsal foot and lower leg moderate swelling, patchy mild erythema without significant increase in warmth or tenderness. No fluctuance or crepitus. Approximately 1 cm area of scabbed ulceration over right dorsal midfoot. Skin: No rashes, lesions or ulcers Psychiatry: Judgement and insight appear normal. Mood & affect appropriate.     Data Reviewed: I have personally reviewed following labs and imaging studies  CBC:  Recent Labs Lab 04/13/17 0903 04/14/17 0550  WBC 12.9* 8.9  NEUTROABS 9.2*  --   HGB 14.2 10.6*  HCT 42.7 34.8*  MCV 98.2 98.0  PLT 501* 811*   Basic Metabolic Panel:  Recent Labs Lab 04/13/17 0903 04/14/17 0550  NA 137 141  K 3.3* 3.0*  CL 101 105  CO2 24 30  GLUCOSE 104* 111*  BUN 15 10  CREATININE 0.78 0.71  CALCIUM 9.2 8.1*  MG  --  1.9   Liver Function Tests:  Recent Labs Lab 04/13/17 0903  AST 17  ALT 10*  ALKPHOS 79  BILITOT 0.8  PROT 6.9  ALBUMIN 3.5   CBG:  Recent Labs Lab 04/13/17 2158  GLUCAP 132*    Recent Results (from the past 240 hour(s))  Blood Culture (routine x 2)     Status: None (Preliminary result)   Collection Time: 04/13/17  8:45 AM  Result Value Ref Range Status   Specimen Description BLOOD RIGHT ANTECUBITAL  Final   Special Requests   Final    BOTTLES DRAWN AEROBIC AND ANAEROBIC  Blood Culture adequate volume   Culture NO GROWTH 1 DAY  Final   Report Status PENDING  Incomplete  Blood Culture (routine x 2)     Status: None (Preliminary result)   Collection Time: 04/13/17  9:21 AM  Result Value Ref Range Status   Specimen Description BLOOD LEFT ANTECUBITAL  Final   Special Requests   Final    BOTTLES DRAWN AEROBIC AND ANAEROBIC Blood Culture adequate volume   Culture NO GROWTH 1 DAY  Final   Report Status PENDING  Incomplete         Radiology Studies: Dg Foot 2 Views Right  Result Date: 04/13/2017 CLINICAL DATA:  Two weeks of swelling and erythema of the foot. The patient has a suspected insect bite over the fourth metatarsal region. EXAM: RIGHT FOOT - 2 VIEW COMPARISON:  None in PACs FINDINGS: The bones are subjectively adequately mineralized. The joint spaces are reasonably well maintained for age. No lytic or blastic lesions of the bones are observed. There is a large amount of soft tissue swelling over the dorsum of the midfoot. There may be a small amount of soft tissue gas. No foreign bodies are observed. IMPRESSION: Findings compatible with cellulitis of the dorsum of the right foot. No radiographic evidence of acute osteomyelitis. Electronically Signed   By: David  Martinique M.D.   On: 04/13/2017 10:11        Scheduled Meds: . enoxaparin (LOVENOX) injection  40 mg Subcutaneous Q24H  . mometasone-formoterol  2 puff Inhalation BID  .  morphine injection  4 mg Intravenous Once  . sucralfate  1 g Oral QID  . umeclidinium bromide  1 puff Inhalation Daily   Continuous Infusions: . sodium chloride 75 mL/hr at 04/14/17 1243  . clindamycin (CLEOCIN) IV Stopped (04/14/17 1451)     LOS: 1 day     Mollee Neer, MD, FACP, FHM. [facility] Pager [phone]  If 7PM-7AM, please contact night-coverage [email or web address] Password [email or web address] 04/14/2017, 5:05 PM

## 2017-04-15 LAB — CBC
HEMATOCRIT: 33.4 % — AB (ref 36.0–46.0)
HEMOGLOBIN: 10.5 g/dL — AB (ref 12.0–15.0)
MCH: 30.9 pg (ref 26.0–34.0)
MCHC: 31.4 g/dL (ref 30.0–36.0)
MCV: 98.2 fL (ref 78.0–100.0)
Platelets: 364 10*3/uL (ref 150–400)
RBC: 3.4 MIL/uL — ABNORMAL LOW (ref 3.87–5.11)
RDW: 14.2 % (ref 11.5–15.5)
WBC: 7.7 10*3/uL (ref 4.0–10.5)

## 2017-04-15 LAB — BASIC METABOLIC PANEL
Anion gap: 5 (ref 5–15)
BUN: 9 mg/dL (ref 6–20)
CHLORIDE: 110 mmol/L (ref 101–111)
CO2: 26 mmol/L (ref 22–32)
Calcium: 8.1 mg/dL — ABNORMAL LOW (ref 8.9–10.3)
Creatinine, Ser: 0.59 mg/dL (ref 0.44–1.00)
GFR calc Af Amer: >60 mL/min (ref >60)
GFR calc non Af Amer: >60 mL/min (ref >60)
GLUCOSE: 100 mg/dL — AB (ref 65–99)
POTASSIUM: 3.9 mmol/L (ref 3.5–5.1)
Sodium: 141 mmol/L (ref 135–145)

## 2017-04-15 MED ORDER — ALBUTEROL SULFATE (2.5 MG/3ML) 0.083% IN NEBU
2.5000 mg | INHALATION_SOLUTION | Freq: Three times a day (TID) | RESPIRATORY_TRACT | Status: DC
  Administered 2017-04-16 (×2): 2.5 mg via RESPIRATORY_TRACT
  Filled 2017-04-15 (×2): qty 3

## 2017-04-15 NOTE — Progress Notes (Addendum)
PROGRESS NOTE   Valerie Gay  YQI:[id number]    DOB: September 17, 1941    DOA: 04/13/2017  PCP: Kathyrn Lass, MD   I have briefly reviewed patients previous medical records in [facility]  Brief Narrative:  76 year old female with PMH of anemia, COPD, diverticulosis, HLD, presented with right foot swelling and pain, no significant improvement despite 10 days course of Keflex, admitted for cellulitis that failed outpatient treatment.Improving.   Assessment & Plan:   Active Problems:   Hypokalemia   COPD, moderate (HCC)   Coronary artery calcification   Esophageal stricture   Cellulitis   GERD (gastroesophageal reflux disease)   1. Right leg/foot cellulitis: Failed outpatient treatment with Keflex 10 days. No real history of insect bites. Started on IV clindamycin, continue. Vascular ultrasound in ED without evidence of DVT. Extremity elevation. Blood cultures 2: Negative to date. X-ray without osteomyelitis. Improving well. Continue additional day of IV antibiotics. 2. Hypokalemia: Replaced. Magnesium 1.9. 3. Esophageal stricture, recurrent: Patient has scheduled EGD appointment for 6/12. Complete 10 days of fluconazole that was started for thrush as outpatient. Patient declines sucralfate due to constipation side effect. 4. COPD: Stable without clinical bronchospasm. 5. CAD: Asymptomatic of chest pain continued continue aspirin. 6. GERD: Continue PPI. 7. Anemia: Stable   DVT prophylaxis: Lovenox Code Status: Full Family Communication: None at bedside Disposition: DC home when medically improved, possibly 6/1   Consultants:  None   Procedures:  None  Antimicrobials:  IV clindamycin    Subjective: Reports improvement in her right foot with decreased swelling, redness and pain.  ROS: No fever or chills.  Objective:  Vitals:   04/14/17 1713 04/14/17 2121 04/15/17 0520 04/15/17 1530  BP:  (!) 99/41 (!) 112/43 (!) 119/35  Pulse:  79 67 64  Resp:  18 18 16     Temp:  98.1 F (36.7 C) 97.6 F (36.4 C) 98.4 F (36.9 C)  TempSrc:  Oral  Oral  SpO2: 97% 99% 98% 95%  Weight:      Height:        Examination:  General exam: Pleasant elderly female lying comfortably supine in bed. Does not appear septic or toxic. Respiratory system: Clear to auscultation. Respiratory effort normal. Cardiovascular system: S1 & S2 heard, RRR. No JVD, murmurs, rubs, gallops or clicks. No pedal edema.Telemetry: Sinus rhythm. Gastrointestinal system: Abdomen is nondistended, soft and nontender. No organomegaly or masses felt. Normal bowel sounds heard. Central nervous system: Alert and oriented. No focal neurological deficits. Extremities: Symmetric 5 x 5 power. Right dorsal foot and lower leg swelling, erythema and warmth have all significantly improved compared to yesterday. No fluctuance or crepitus. Approximately 1 cm area of scabbed ulceration over right dorsal midfoot. Skin: No rashes, lesions or ulcers Psychiatry: Judgement and insight appear normal. Mood & affect appropriate.     Data Reviewed: I have personally reviewed following labs and imaging studies  CBC:  Recent Labs Lab 04/13/17 0903 04/14/17 0550 04/15/17 0450  WBC 12.9* 8.9 7.7  NEUTROABS 9.2*  --   --   HGB 14.2 10.6* 10.5*  HCT 42.7 34.8* 33.4*  MCV 98.2 98.0 98.2  PLT 501* 414* 387   Basic Metabolic Panel:  Recent Labs Lab 04/13/17 0903 04/14/17 0550 04/15/17 0450  NA 137 141 141  K 3.3* 3.0* 3.9  CL 101 105 110  CO2 24 30 26   GLUCOSE 104* 111* 100*  BUN 15 10 9   CREATININE 0.78 0.71 0.59  CALCIUM 9.2 8.1* 8.1*  MG  --  1.9  --    Liver Function Tests:  Recent Labs Lab 04/13/17 0903  AST 17  ALT 10*  ALKPHOS 79  BILITOT 0.8  PROT 6.9  ALBUMIN 3.5   CBG:  Recent Labs Lab 04/13/17 2158  GLUCAP 132*    Recent Results (from the past 240 hour(s))  Blood Culture (routine x 2)     Status: None (Preliminary result)   Collection Time: 04/13/17  8:45 AM  Result  Value Ref Range Status   Specimen Description BLOOD RIGHT ANTECUBITAL  Final   Special Requests   Final    BOTTLES DRAWN AEROBIC AND ANAEROBIC Blood Culture adequate volume   Culture NO GROWTH 2 DAYS  Final   Report Status PENDING  Incomplete  Blood Culture (routine x 2)     Status: None (Preliminary result)   Collection Time: 04/13/17  9:21 AM  Result Value Ref Range Status   Specimen Description BLOOD LEFT ANTECUBITAL  Final   Special Requests   Final    BOTTLES DRAWN AEROBIC AND ANAEROBIC Blood Culture adequate volume   Culture NO GROWTH 2 DAYS  Final   Report Status PENDING  Incomplete         Radiology Studies: No results found.      Scheduled Meds: . enoxaparin (LOVENOX) injection  40 mg Subcutaneous Q24H  . fluconazole  100 mg Oral QHS  . mometasone-formoterol  2 puff Inhalation BID  .  morphine injection  4 mg Intravenous Once  . umeclidinium bromide  1 puff Inhalation Daily   Continuous Infusions: . clindamycin (CLEOCIN) IV Stopped (04/15/17 1707)     LOS: 2 days     Markee Matera, MD, FACP, FHM. [facility] Pager [phone]  If 7PM-7AM, please contact night-coverage [email or web address] Password [facility] 04/15/2017, 7:28 PM

## 2017-04-16 DIAGNOSIS — J449 Chronic obstructive pulmonary disease, unspecified: Secondary | ICD-10-CM

## 2017-04-16 MED ORDER — NYSTATIN 100000 UNIT/GM EX POWD: 1.0000 g | Freq: Every day | CUTANEOUS | Status: DC | PRN

## 2017-04-16 MED ORDER — CLINDAMYCIN HCL 300 MG PO CAPS: 300.0000 mg | ORAL_CAPSULE | Freq: Four times a day (QID) | ORAL | 0 refills | Status: DC

## 2017-04-16 MED ORDER — ACETAMINOPHEN 325 MG PO TABS: 650.0000 mg | ORAL_TABLET | Freq: Four times a day (QID) | ORAL | Status: DC | PRN

## 2017-04-16 NOTE — Progress Notes (Signed)
Discharge instructions given to the the patients.  Patient verbalized understanding of discharge instructions.  Patient PIV removed, area CDI.    Discharge Vitals  Vitals:   04/15/17 2140 04/16/17 0527  BP:  (!) 120/48  Pulse: 66 67  Resp: 18 18  Temp:  97.3 F (36.3 C)   Discharge medications  Allergies as of 04/16/2017      Reactions   Spiriva Handihaler [tiotropium Bromide Monohydrate] Hives, Itching, Rash    on upper body   Banana Itching      Medication List    STOP taking these medications   aspirin EC 81 MG tablet   cephALEXin 250 MG/5ML suspension Commonly known as:  KEFLEX   omeprazole 20 MG capsule Commonly known as:  PRILOSEC   sucralfate 1 GM/10ML suspension Commonly known as:  CARAFATE     TAKE these medications   acetaminophen 325 MG tablet Commonly known as:  TYLENOL Take 2 tablets (650 mg total) by mouth every 6 (six) hours as needed for mild pain, moderate pain or fever (or Fever >/= 101).   albuterol (2.5 MG/3ML) 0.083% nebulizer solution Commonly known as:  PROVENTIL Take 3 mLs (2.5 mg total) by nebulization every 6 (six) hours as needed for wheezing or shortness of breath. DJ: J44.9   albuterol 108 (90 Base) MCG/ACT inhaler Commonly known as:  PROVENTIL HFA;VENTOLIN HFA Inhale 2 puffs into the lungs every 6 (six) hours as needed for wheezing or shortness of breath.   clindamycin 300 MG capsule Commonly known as:  CLEOCIN Take 1 capsule (300 mg total) by mouth 4 (four) times daily.   EX-LAX 15 MG Chew Generic drug:  Sennosides Chew 3 tablets by mouth daily as needed.   fluconazole 200 MG tablet Commonly known as:  DIFLUCAN Take 1 tablet (200 mg total) by mouth as directed. Take 2 tablets on 1st day, then one tablet daily until gone.   INCRUSE ELLIPTA 62.5 MCG/INH Aepb Generic drug:  umeclidinium bromide USE 1 INHALATION DAILY What changed:  See the new instructions.   NASACORT ALLERGY 24HR NA Place 1 spray into the nose daily as  needed (allergies).   nystatin powder Commonly known as:  MYCOSTATIN/NYSTOP Apply 1 g topically daily as needed (fungus).   OXYGEN Inhale 2 L into the lungs as needed (shortness of breath).   SYMBICORT 160-4.5 MCG/ACT inhaler Generic drug:  budesonide-formoterol USE 2 INHALATIONS TWICE A DAY      Patient waiting for daughter.  Forrestine Him, RN  04/16/17 1455

## 2017-04-16 NOTE — Addendum Note (Signed)
Addendum  created 04/16/17 1148 by Effie Berkshire, MD   Sign clinical note

## 2017-04-16 NOTE — Progress Notes (Signed)
Tele discontinued.  Called Tisha in [place] monitoring.  Patient is on continuous pulse oximetry.

## 2017-04-16 NOTE — Care Management Important Message (Signed)
Important Message  Patient Details  Name: Valerie Gay MRN: [id number] Date of Birth: [birth year]   Medicare Important Message Given:  Yes    Nathen May 04/16/2017, 1:42 PM

## 2017-04-16 NOTE — Discharge Summary (Addendum)
Physician Discharge Summary  Valerie Gay KWI:[id number] DOB: Jun 24, 1941  PCP: Kathyrn Lass, MD  Admit date: 04/13/2017 Discharge date: 04/16/2017  Recommendations for Outpatient Follow-up:  1. Dr. Kathyrn Lass, PCP in 5 days with repeat labs (CBC & BMP). Please follow final blood culture results that percent from the hospital.   Home Health: None Equipment/Devices: None    Discharge Condition: Improved and stable  CODE STATUS: Full  Diet recommendation: Heart healthy diet.  Discharge Diagnoses:  Active Problems:   Hypokalemia   COPD, moderate (HCC)   Coronary artery calcification   Esophageal stricture   Cellulitis   GERD (gastroesophageal reflux disease)   Brief Summary: 76 year old female with PMH of anemia, COPD on when necessary home oxygen, diverticulosis, HLD, presented with right foot swelling and pain, no significant improvement despite 10 days course of Keflex, admitted for cellulitis that failed outpatient treatment.   Assessment & Plan:   1. Right leg/foot cellulitis: Failed outpatient treatment with Keflex 10 days. No real history of insect bites. Vascular ultrasound in ED without evidence of DVT. Blood cultures 2: Negative to date. X-ray without osteomyelitis. Empirically treated with IV clindamycin with significant improvement. Completed 3 days of IV antibiotics. Transitioned to oral clindamycin to complete total 7 days treatment. Outpatient follow-up with PCP. 2. Hypokalemia: Replaced. Magnesium 1.9. 3. Esophageal stricture, recurrent: Patient has scheduled EGD appointment for 6/12. Complete 10 days of fluconazole that was started for thrush as outpatient. Today patient indicated that she does not take sucralfate or Prilosec at home. Does not report dysphagia or significant heartburn. 4. COPD: Stable without clinical bronchospasm. Indicates that she has home oxygen which she uses as needed and mostly at night. 5. CAD: Asymptomatic of chest pain. 6. GERD:   not very symptomatic and does not use medications at home. 7. Anemia: Stable. Outpatient follow-up.   Consultants:  None   Procedures:  None   Discharge Instructions  Discharge Instructions    Call MD for:  difficulty breathing, headache or visual disturbances    Complete by:  As directed    Call MD for:  extreme fatigue    Complete by:  As directed    Call MD for:  persistant dizziness or light-headedness    Complete by:  As directed    Call MD for:  redness, tenderness, or signs of infection (pain, swelling, redness, odor or green/yellow discharge around incision site)    Complete by:  As directed    Call MD for:  severe uncontrolled pain    Complete by:  As directed    Call MD for:  temperature >100.4    Complete by:  As directed    Diet - low sodium heart healthy    Complete by:  As directed    Increase activity slowly    Complete by:  As directed        Medication List    STOP taking these medications   aspirin EC 81 MG tablet   cephALEXin 250 MG/5ML suspension Commonly known as:  KEFLEX   omeprazole 20 MG capsule Commonly known as:  PRILOSEC   sucralfate 1 GM/10ML suspension Commonly known as:  CARAFATE     TAKE these medications   acetaminophen 325 MG tablet Commonly known as:  TYLENOL Take 2 tablets (650 mg total) by mouth every 6 (six) hours as needed for mild pain, moderate pain or fever (or Fever >/= 101).   albuterol (2.5 MG/3ML) 0.083% nebulizer solution Commonly known as:  PROVENTIL Take 3 mLs (  2.5 mg total) by nebulization every 6 (six) hours as needed for wheezing or shortness of breath. DJ: J44.9   albuterol 108 (90 Base) MCG/ACT inhaler Commonly known as:  PROVENTIL HFA;VENTOLIN HFA Inhale 2 puffs into the lungs every 6 (six) hours as needed for wheezing or shortness of breath.   clindamycin 300 MG capsule Commonly known as:  CLEOCIN Take 1 capsule (300 mg total) by mouth 4 (four) times daily.   EX-LAX 15 MG Chew Generic drug:   Sennosides Chew 3 tablets by mouth daily as needed.   fluconazole 200 MG tablet Commonly known as:  DIFLUCAN Take 1 tablet (200 mg total) by mouth as directed. Take 2 tablets on 1st day, then one tablet daily until gone.   INCRUSE ELLIPTA 62.5 MCG/INH Aepb Generic drug:  umeclidinium bromide USE 1 INHALATION DAILY What changed:  See the new instructions.   NASACORT ALLERGY 24HR NA Place 1 spray into the nose daily as needed (allergies).   nystatin powder Commonly known as:  MYCOSTATIN/NYSTOP Apply 1 g topically daily as needed (fungus).   OXYGEN Inhale 2 L into the lungs as needed (shortness of breath).   SYMBICORT 160-4.5 MCG/ACT inhaler Generic drug:  budesonide-formoterol USE 2 INHALATIONS TWICE A DAY      Follow-up Information    Kathyrn Lass, MD. Schedule an appointment as soon as possible for a visit in 5 day(s).   Specialty:  Family Medicine Why:  To be seen with repeat labs (CBC & BMP). Contact information: [place] [address] [phone]          Allergies  Allergen Reactions  . Spiriva Handihaler [Tiotropium Bromide Monohydrate] Hives, Itching and Rash     on upper body  . Banana Itching      Procedures/Studies: Dg Foot 2 Views Right  Result Date: 04/13/2017 CLINICAL DATA:  Two weeks of swelling and erythema of the foot. The patient has a suspected insect bite over the fourth metatarsal region. EXAM: RIGHT FOOT - 2 VIEW COMPARISON:  None in PACs FINDINGS: The bones are subjectively adequately mineralized. The joint spaces are reasonably well maintained for age. No lytic or blastic lesions of the bones are observed. There is a large amount of soft tissue swelling over the dorsum of the midfoot. There may be a small amount of soft tissue gas. No foreign bodies are observed. IMPRESSION: Findings compatible with cellulitis of the dorsum of the right foot. No radiographic evidence of acute osteomyelitis. Electronically Signed   By:  David  Martinique M.D.   On: 04/13/2017 10:11      Subjective: Indicates that her right foot continues to improve with significant improvement in redness, swelling and pain. Denies dyspnea, chest pain or cough. Uses when necessary oxygen at home. Feels comfortable returning home.  Discharge Exam:  Vitals:   04/15/17 2140 04/16/17 0527 04/16/17 0910 04/16/17 1400  BP:  (!) 120/48    Pulse: 66 67    Resp: 18 18    Temp:  97.3 F (36.3 C)    TempSrc:  Oral    SpO2:  97% 98% 96%  Weight:      Height:        General exam: Pleasant elderly female lying comfortably supine in bed. Does not appear septic or toxic. Respiratory system: Clear to auscultation. Respiratory effort normal. Cardiovascular system: S1 & S2 heard, RRR. No JVD, murmurs, rubs, gallops or clicks. No pedal edema. Gastrointestinal system: Abdomen is nondistended, soft and nontender. No organomegaly or  masses felt. Normal bowel sounds heard. Central nervous system: Alert and oriented. No focal neurological deficits. Extremities: Symmetric 5 x 5 power. Right dorsal foot and lower leg swelling, erythema and warmth have almost resolved. Approximately 1 cm area of scabbed ulceration over right dorsal midfoot. Skin: No rashes, lesions or ulcers Psychiatry: Judgement and insight appear normal. Mood & affect appropriate.     The results of significant diagnostics from this hospitalization (including imaging, microbiology, ancillary and laboratory) are listed below for reference.     Microbiology: Recent Results (from the past 240 hour(s))  Blood Culture (routine x 2)     Status: None (Preliminary result)   Collection Time: 04/13/17  8:45 AM  Result Value Ref Range Status   Specimen Description BLOOD RIGHT ANTECUBITAL  Final   Special Requests   Final    BOTTLES DRAWN AEROBIC AND ANAEROBIC Blood Culture adequate volume   Culture NO GROWTH 3 DAYS  Final   Report Status PENDING  Incomplete  Blood Culture (routine x 2)      Status: None (Preliminary result)   Collection Time: 04/13/17  9:21 AM  Result Value Ref Range Status   Specimen Description BLOOD LEFT ANTECUBITAL  Final   Special Requests   Final    BOTTLES DRAWN AEROBIC AND ANAEROBIC Blood Culture adequate volume   Culture NO GROWTH 3 DAYS  Final   Report Status PENDING  Incomplete     Labs: CBC:  Recent Labs Lab 04/13/17 0903 04/14/17 0550 04/15/17 0450  WBC 12.9* 8.9 7.7  NEUTROABS 9.2*  --   --   HGB 14.2 10.6* 10.5*  HCT 42.7 34.8* 33.4*  MCV 98.2 98.0 98.2  PLT 501* 414* 620   Basic Metabolic Panel:  Recent Labs Lab 04/13/17 0903 04/14/17 0550 04/15/17 0450  NA 137 141 141  K 3.3* 3.0* 3.9  CL 101 105 110  CO2 24 30 26   GLUCOSE 104* 111* 100*  BUN 15 10 9   CREATININE 0.78 0.71 0.59  CALCIUM 9.2 8.1* 8.1*  MG  --  1.9  --    Liver Function Tests:  Recent Labs Lab 04/13/17 0903  AST 17  ALT 10*  ALKPHOS 79  BILITOT 0.8  PROT 6.9  ALBUMIN 3.5   CBG:  Recent Labs Lab 04/13/17 2158  GLUCAP 132*   Urinalysis    Component Value Date/Time   COLORURINE YELLOW 04/13/2017 1215   APPEARANCEUR HAZY (A) 04/13/2017 1215   LABSPEC 1.025 04/13/2017 1215   PHURINE 5.0 04/13/2017 1215   GLUCOSEU NEGATIVE 04/13/2017 1215   HGBUR SMALL (A) 04/13/2017 1215   BILIRUBINUR NEGATIVE 04/13/2017 1215   KETONESUR 20 (A) 04/13/2017 1215   PROTEINUR 30 (A) 04/13/2017 1215   UROBILINOGEN 1.0 03/29/2014 1032   NITRITE NEGATIVE 04/13/2017 1215   LEUKOCYTESUR NEGATIVE 04/13/2017 1215      Time coordinating discharge: Less than 30 minutes  SIGNED:  Vernell Leep, MD, FACP, FHM. [facility] Pager [phone]  If 7PM-7AM, please contact night-coverage [email or web address] Password [email or web address] 04/16/2017, 2:18 PM

## 2017-04-18 LAB — CULTURE, BLOOD (ROUTINE X 2)
CULTURE: NO GROWTH
CULTURE: NO GROWTH
SPECIAL REQUESTS: ADEQUATE
Special Requests: ADEQUATE

## 2017-04-19 ENCOUNTER — Telehealth: Payer: Self-pay | Admitting: Gastroenterology

## 2017-04-19 NOTE — Telephone Encounter (Signed)
A user error has taken place.

## 2017-04-19 NOTE — Anesthesia Postprocedure Evaluation (Signed)
Anesthesia Post Note  Patient: Valerie Gay  Procedure(s) Performed: Procedure(s) (LRB): ESOPHAGOGASTRODUODENOSCOPY (EGD) (N/A)     Anesthesia Post Evaluation  Last Vitals:  Vitals:   04/06/17 1350 04/06/17 1400  BP: 112/65 (!) 128/50  Pulse: 86 91  Resp: (!) 26 20  Temp:      Last Pain:  Vitals:   04/07/17 1532  TempSrc:   PainSc: 0-No pain                 Anjani Feuerborn S

## 2017-04-19 NOTE — Addendum Note (Signed)
Addendum  created 04/19/17 1441 by Myrtie Soman, MD   Sign clinical note

## 2017-04-21 ENCOUNTER — Ambulatory Visit (AMBULATORY_SURGERY_CENTER): Payer: Self-pay | Admitting: *Deleted

## 2017-04-21 VITALS — Ht 62.5 in | Wt 136.0 lb

## 2017-04-21 DIAGNOSIS — R1310 Dysphagia, unspecified: Secondary | ICD-10-CM

## 2017-04-21 NOTE — Progress Notes (Signed)
Patient denies any allergies to eggs or soy. Patient denies any problems with anesthesia/sedation. Patient does not take any diet/weight loss medications. NO e-mail per pt. O2 use at home.

## 2017-04-22 DIAGNOSIS — K222 Esophageal obstruction: Secondary | ICD-10-CM | POA: Diagnosis not present

## 2017-04-22 DIAGNOSIS — L03115 Cellulitis of right lower limb: Secondary | ICD-10-CM | POA: Diagnosis not present

## 2017-04-22 DIAGNOSIS — E876 Hypokalemia: Secondary | ICD-10-CM | POA: Diagnosis not present

## 2017-04-26 ENCOUNTER — Encounter (HOSPITAL_COMMUNITY): Payer: Self-pay | Admitting: *Deleted

## 2017-04-27 ENCOUNTER — Other Ambulatory Visit: Payer: Self-pay

## 2017-04-27 ENCOUNTER — Encounter (HOSPITAL_COMMUNITY)
Admission: RE | Disposition: A | Discharge status: Home / Self Care | Payer: Self-pay | Source: Ambulatory Visit | Attending: Gastroenterology

## 2017-04-27 ENCOUNTER — Ambulatory Visit (HOSPITAL_COMMUNITY)
Admission: RE | Admit: 2017-04-27 | Discharge: 2017-04-27 | Disposition: A | Discharge status: Home / Self Care | Payer: Medicare Other | Source: Ambulatory Visit | Attending: Gastroenterology | Admitting: Gastroenterology

## 2017-04-27 ENCOUNTER — Encounter (HOSPITAL_COMMUNITY): Payer: Self-pay | Admitting: *Deleted

## 2017-04-27 ENCOUNTER — Ambulatory Visit (HOSPITAL_COMMUNITY): Payer: Medicare Other | Admitting: Anesthesiology

## 2017-04-27 DIAGNOSIS — Z888 Allergy status to other drugs, medicaments and biological substances status: Secondary | ICD-10-CM | POA: Insufficient documentation

## 2017-04-27 DIAGNOSIS — J449 Chronic obstructive pulmonary disease, unspecified: Secondary | ICD-10-CM | POA: Insufficient documentation

## 2017-04-27 DIAGNOSIS — Z800 Family history of malignant neoplasm of digestive organs: Secondary | ICD-10-CM | POA: Insufficient documentation

## 2017-04-27 DIAGNOSIS — K449 Diaphragmatic hernia without obstruction or gangrene: Secondary | ICD-10-CM | POA: Diagnosis not present

## 2017-04-27 DIAGNOSIS — K219 Gastro-esophageal reflux disease without esophagitis: Secondary | ICD-10-CM

## 2017-04-27 DIAGNOSIS — Z9981 Dependence on supplemental oxygen: Secondary | ICD-10-CM | POA: Diagnosis not present

## 2017-04-27 DIAGNOSIS — Z91018 Allergy to other foods: Secondary | ICD-10-CM | POA: Insufficient documentation

## 2017-04-27 DIAGNOSIS — Z79899 Other long term (current) drug therapy: Secondary | ICD-10-CM | POA: Insufficient documentation

## 2017-04-27 DIAGNOSIS — R1310 Dysphagia, unspecified: Secondary | ICD-10-CM | POA: Diagnosis not present

## 2017-04-27 DIAGNOSIS — K810 Acute cholecystitis: Secondary | ICD-10-CM | POA: Diagnosis not present

## 2017-04-27 DIAGNOSIS — D122 Benign neoplasm of ascending colon: Secondary | ICD-10-CM | POA: Diagnosis not present

## 2017-04-27 DIAGNOSIS — Z833 Family history of diabetes mellitus: Secondary | ICD-10-CM | POA: Insufficient documentation

## 2017-04-27 DIAGNOSIS — Z86010 Personal history of colonic polyps: Secondary | ICD-10-CM | POA: Diagnosis not present

## 2017-04-27 DIAGNOSIS — Z87442 Personal history of urinary calculi: Secondary | ICD-10-CM | POA: Diagnosis not present

## 2017-04-27 DIAGNOSIS — K222 Esophageal obstruction: Secondary | ICD-10-CM | POA: Diagnosis not present

## 2017-04-27 DIAGNOSIS — F17210 Nicotine dependence, cigarettes, uncomplicated: Secondary | ICD-10-CM | POA: Insufficient documentation

## 2017-04-27 DIAGNOSIS — E785 Hyperlipidemia, unspecified: Secondary | ICD-10-CM | POA: Insufficient documentation

## 2017-04-27 HISTORY — PX: ESOPHAGOGASTRODUODENOSCOPY (EGD) WITH PROPOFOL: SHX5813

## 2017-04-27 HISTORY — DX: Personal history of urinary calculi: Z87.442

## 2017-04-27 HISTORY — DX: Personal history of other diseases of the digestive system: Z87.19

## 2017-04-27 SURGERY — ESOPHAGOGASTRODUODENOSCOPY (EGD) WITH PROPOFOL
Anesthesia: Monitor Anesthesia Care

## 2017-04-27 MED ORDER — SODIUM CHLORIDE 0.9 % IV SOLN: INTRAVENOUS | Status: DC

## 2017-04-27 MED ORDER — PROPOFOL 10 MG/ML IV BOLUS
INTRAVENOUS | Status: DC | PRN
  Administered 2017-04-27: 20 mg via INTRAVENOUS
  Administered 2017-04-27: 10 mg via INTRAVENOUS
  Administered 2017-04-27 (×6): 20 mg via INTRAVENOUS

## 2017-04-27 MED ORDER — OMEPRAZOLE 20 MG PO CPDR: 20.0000 mg | DELAYED_RELEASE_CAPSULE | Freq: Every day | ORAL | 2 refills | Status: DC

## 2017-04-27 MED ORDER — PROPOFOL 10 MG/ML IV BOLUS
INTRAVENOUS | Status: AC
  Filled 2017-04-27: qty 20

## 2017-04-27 MED ORDER — LACTATED RINGERS IV SOLN
INTRAVENOUS | Status: DC
  Administered 2017-04-27: 10:00:00 via INTRAVENOUS

## 2017-04-27 SURGICAL SUPPLY — 15 items

## 2017-04-27 NOTE — Op Note (Signed)
[facility] Patient Name: Valerie Gay Procedure Date: 04/27/2017 MRN: [id number] Attending MD: Carlota Raspberry. Aleida Crandell MD, MD Date of Birth: [birth year] CSN: [id number] Age: 76 Admit Type: Outpatient Procedure:                Upper GI endoscopy Indications:              Dysphagia, history of balloon dilation to 51m,                            history of food impaction a few weeks ago Providers:                SRemo LippsP. Jihan Rudy MD, MD, PCleda Daub RN,                            CElspeth [employer]., Technician, SLittle FlockAlday                            CRNA, CRNA Referring MD:              Medicines:                Monitored Anesthesia Care Complications:            No immediate complications. Estimated blood loss:                            Minimal. Estimated Blood Loss:     Estimated blood loss was minimal. Procedure:                Pre-Anesthesia Assessment:                           - Prior to the procedure, a History and Physical                            was performed, and patient medications and                            allergies were reviewed. The patient's tolerance of                            previous anesthesia was also reviewed. The risks                            and benefits of the procedure and the sedation                            options and risks were discussed with the patient.                            All questions were answered, and informed consent                            was obtained. Prior Anticoagulants: The patient has  taken no previous anticoagulant or antiplatelet                            agents. ASA Grade Assessment: III - A patient with                            severe systemic disease. After reviewing the risks                            and benefits, the patient was deemed in                            satisfactory condition to undergo the procedure.                           After  obtaining informed consent, the endoscope was                            passed under direct vision. Throughout the                            procedure, the patient's blood pressure, pulse, and                            oxygen saturations were monitored continuously. The                            EG-[id number] ([id number]) scope was introduced through the                            mouth, and advanced to the antrum of the stomach.                            The upper GI endoscopy was accomplished without                            difficulty. The patient tolerated the procedure                            well. Scope In: Scope Out: Findings:      Esophagogastric landmarks were identified: the Z-line was found at 36       cm, the gastroesophageal junction was found at 36 cm and the upper       extent of the gastric folds was found at 38 cm from the incisors.      A 2 cm hiatal hernia was present.      One moderate benign-appearing, intrinsic stenosis was found. This       measured less than one cm (in length), the lumen appeared improved since       it was last evaluated. A TTS dilator was passed through the scope.       Dilation with a 15-16.5-18 mm balloon dilator was performed to 16.5 mm       at which point a moderate mucosal wrent was noted, no further dilation  was performed in this light.      The exam of the esophagus was otherwise normal.      The entire examined stomach was normal. Small bowel was not evaluated       since it has recently been evaluated and normal. Impression:               - Esophagogastric landmarks identified.                           - 2 cm hiatal hernia.                           - Benign-appearing esophageal stenosis. Dilated to                            16.27m with good result.                           - Normal stomach. Moderate Sedation:      No moderate sedation, case performed with MAC Recommendation:           - Patient has a contact number  available for                            emergencies. The signs and symptoms of potential                            delayed complications were discussed with the                            patient. Return to normal activities tomorrow.                            Written discharge instructions were provided to the                            patient.                           - Advance diet as tolerated.                           - If eating meat / chicken, eat very small bites                            and chew food well                           - Continue present medications                           - Trial of omeprazole 264monce daily to help                            prevent recurrent stricturing                           -  Repeat upper endoscopy as needed for retreatment                            if symptoms recur. Procedure Code(s):        --- Professional ---                           [id number], 37, Esophagogastroduodenoscopy, flexible,                            transoral; with transendoscopic balloon dilation of                            esophagus (less than 30 mm diameter) Diagnosis Code(s):        --- Professional ---                           K44.9, Diaphragmatic hernia without obstruction or                            gangrene                           K22.2, Esophageal obstruction                           R13.10, Dysphagia, unspecified CPT copyright 2016 American Medical Association. All rights reserved. The codes documented in this report are preliminary and upon coder review may  be revised to meet current compliance requirements. Remo Lipps P. Laiyah Exline MD, MD 04/27/2017 10:40:28 AM This report has been signed electronically. Number of Addenda: 0

## 2017-04-27 NOTE — H&P (Signed)
HPI :  76 y/o female with a history of benign distal esophageal stricture, s/p dilation up to 20mm, with history of food impaction on 5/22, here for repeat EGD with dilation as needed. Also treated for esophageal candidiasis since the last visit.   Past Medical History:  Diagnosis Date  . Anemia age 10  . Cellulitis and abscess of foot 03/2017   rt foot  . Colon polyps    2010   . COPD (chronic obstructive pulmonary disease) ([place])   . Diverticulosis   . History of hiatal hernia   . History of kidney stones   . Hyperlipidemia   . On home oxygen therapy      Past Surgical History:  Procedure Laterality Date  . BALLOON DILATION N/A 03/08/2017   Procedure: BALLOON DILATION;  Surgeon: Manus Gunning, MD;  Location: [facility];  Service: Gastroenterology;  Laterality: N/A;  . CATARACT EXTRACTION Right    August 15, 2013  . CHOLECYSTECTOMY N/A 06/22/2014   Procedure: LAPAROSCOPIC CHOLECYSTECTOMY WITH INTRAOPERATIVE CHOLANGIOGRAM;  Surgeon: Gayland Curry, MD;  Location: [facility];  Service: General;  Laterality: N/A;  . COLONOSCOPY N/A 02/19/2017   Procedure: COLONOSCOPY;  Surgeon: Manus Gunning, MD;  Location: [facility];  Service: Gastroenterology;  Laterality: N/A;  . ESOPHAGOGASTRODUODENOSCOPY N/A 02/19/2017   Procedure: ESOPHAGOGASTRODUODENOSCOPY (EGD);  Surgeon: Manus Gunning, MD;  Location: [facility];  Service: Gastroenterology;  Laterality: N/A;  . ESOPHAGOGASTRODUODENOSCOPY N/A 03/08/2017   Procedure: ESOPHAGOGASTRODUODENOSCOPY (EGD);  Surgeon: Manus Gunning, MD;  Location: [facility];  Service: Gastroenterology;  Laterality: N/A;  . ESOPHAGOGASTRODUODENOSCOPY N/A 04/06/2017   Procedure: ESOPHAGOGASTRODUODENOSCOPY (EGD);  Surgeon: Manus Gunning, MD;  Location: [facility];  Service: Gastroenterology;  Laterality: N/A;  . NM MYOVIEW LTD  10/2016    EF 55-65% with normal LV function. LOW RISK. No ischemia or infarction.  .  TONSILLECTOMY     Family History  Problem Relation Age of Onset  . Alzheimer's disease Mother   . Diabetes Mother   . Cancer Father   . Stomach cancer Paternal Grandmother   . Colon cancer Neg Hx   . Esophageal cancer Neg Hx    Social History  Substance Use Topics  . Smoking status: Current Every Day Smoker    Packs/day: 0.50    Years: 60.00    Types: Cigarettes  . Smokeless tobacco: Never Used     Comment: 1/2 ppd 11.20.17  . Alcohol use No   Current Facility-Administered Medications  Medication Dose Route Frequency Provider Last Rate Last Dose  . 0.9 %  sodium chloride infusion   Intravenous Continuous Tionne Carelli, Renelda Loma, MD      . lactated ringers infusion   Intravenous Continuous Braden Deloach, Renelda Loma, MD 20 mL/hr at 04/27/17 9371     Allergies  Allergen Reactions  . Spiriva Handihaler [Tiotropium Bromide Monohydrate] Hives, Itching and Rash     on upper body  . Banana Itching     Review of Systems: All systems reviewed and negative except where noted in HPI.    Dg Foot 2 Views Right  Result Date: 04/13/2017 CLINICAL DATA:  Two weeks of swelling and erythema of the foot. The patient has a suspected insect bite over the fourth metatarsal region. EXAM: RIGHT FOOT - 2 VIEW COMPARISON:  None in PACs FINDINGS: The bones are subjectively adequately mineralized. The joint spaces are reasonably well maintained for age. No lytic or blastic lesions of the bones are observed. There is a large amount  of soft tissue swelling over the dorsum of the midfoot. There may be a small amount of soft tissue gas. No foreign bodies are observed. IMPRESSION: Findings compatible with cellulitis of the dorsum of the right foot. No radiographic evidence of acute osteomyelitis. Electronically Signed   By: David  Martinique M.D.   On: 04/13/2017 10:11    Physical Exam: BP (!) 149/56   Pulse 100   Temp 98.1 F (36.7 C) (Oral)   Resp (!) 26   Ht 5' 2.5" (1.588 m)   Wt 136 lb (61.7 kg)    SpO2 97%   BMI 24.48 kg/m  Constitutional: Pleasant,well-developed, emale in no acute distress. HEENT: Normocephalic and atraumatic. Conjunctivae are normal. No scleral icterus. Neck supple.  Cardiovascular: Normal rate, regular rhythm.  Pulmonary/chest: Effort normal and breath sounds normal. No wheezing, rales or rhonchi. Abdominal: Soft, nondistended, nontender.   ASSESSMENT AND PLAN: 76 y/o female with history of esophageal stricture with food impaction, here for repeat EGD with dilation. Discussed risks / benefits and they wish to proceed.   [place] Cellar, MD [facility]astroenterology Pager [phone]  No ref. provider found

## 2017-04-27 NOTE — Anesthesia Preprocedure Evaluation (Signed)
Anesthesia Evaluation  Patient identified by MRN, date of birth, ID band Patient awake    Reviewed: Allergy & Precautions, NPO status , Patient's Chart, lab work & pertinent test results  Airway Mallampati: II  TM Distance: >3 FB Neck ROM: Full    Dental no notable dental hx.    Pulmonary COPD,  oxygen dependent, Current Smoker,    Pulmonary exam normal breath sounds clear to auscultation       Cardiovascular Normal cardiovascular exam Rhythm:Regular Rate:Normal     Neuro/Psych negative neurological ROS  negative psych ROS   GI/Hepatic negative GI ROS, Neg liver ROS,   Endo/Other  negative endocrine ROS  Renal/GU negative Renal ROS  negative genitourinary   Musculoskeletal negative musculoskeletal ROS (+)   Abdominal   Peds negative pediatric ROS (+)  Hematology negative hematology ROS (+)   Anesthesia Other Findings   Reproductive/Obstetrics negative OB ROS                             Anesthesia Physical Anesthesia Plan  ASA: III  Anesthesia Plan: MAC   Post-op Pain Management:    Induction: Intravenous  PONV Risk Score and Plan: 0  Airway Management Planned: Nasal Cannula  Additional Equipment:   Intra-op Plan:   Post-operative Plan:   Informed Consent: I have reviewed the patients History and Physical, chart, labs and discussed the procedure including the risks, benefits and alternatives for the proposed anesthesia with the patient or authorized representative who has indicated his/her understanding and acceptance.   Dental advisory given  Plan Discussed with: CRNA and Surgeon  Anesthesia Plan Comments:         Anesthesia Quick Evaluation

## 2017-04-27 NOTE — Discharge Instructions (Signed)
Esophagogastroduodenoscopy, Care After °Refer to this sheet in the next few weeks. These instructions provide you with information about caring for yourself after your procedure. Your health care provider may also give you more specific instructions. Your treatment has been planned according to current medical practices, but problems sometimes occur. Call your health care provider if you have any problems or questions after your procedure. °What can I expect after the procedure? °After the procedure, it is common to have: °· A sore throat. °· Nausea. °· Bloating. °· Dizziness. °· Fatigue. ° °Follow these instructions at home: °· Do not eat or drink anything until the numbing medicine (local anesthetic) has worn off and your gag reflex has returned. You will know that the local anesthetic has worn off when you can swallow comfortably. °· Do not drive for 24 hours if you received a medicine to help you relax (sedative). °· If your health care provider took a tissue sample for testing during the procedure, make sure to get your test results. This is your responsibility. Ask your health care provider or the department performing the test when your results will be ready. °· Keep all follow-up visits as told by your health care provider. This is important. °Contact a health care provider if: °· You cannot stop coughing. °· You are not urinating. °· You are urinating less than usual. °Get help right away if: °· You have trouble swallowing. °· You cannot eat or drink. °· You have throat or chest pain that gets worse. °· You are dizzy or light-headed. °· You faint. °· You have nausea or vomiting. °· You have chills. °· You have a fever. °· You have severe abdominal pain. °· You have black, tarry, or bloody stools. °This information is not intended to replace advice given to you by your health care provider. Make sure you discuss any questions you have with your health care provider. °Document Released: 10/19/2012 Document  Revised: 04/09/2016 Document Reviewed: 09/26/2015 °Elsevier Interactive Patient Education © 2018 Else[employer] Inc[employer] ° °

## 2017-04-27 NOTE — Anesthesia Postprocedure Evaluation (Signed)
Anesthesia Post Note  Patient: Valerie Gay  Procedure(s) Performed: Procedure(s) (LRB): ESOPHAGOGASTRODUODENOSCOPY (EGD) WITH PROPOFOL (N/A)     Patient location during evaluation: PACU Anesthesia Type: MAC Level of consciousness: awake and alert Pain management: pain level controlled Vital Signs Assessment: post-procedure vital signs reviewed and stable Respiratory status: spontaneous breathing, nonlabored ventilation, respiratory function stable and patient connected to nasal cannula oxygen Cardiovascular status: stable and blood pressure returned to baseline Anesthetic complications: no    Last Vitals:  Vitals:   04/27/17 1052 04/27/17 1100  BP:  (!) 147/58  Pulse:  77  Resp: (!) 24 16  Temp:      Last Pain:  Vitals:   04/27/17 1035  TempSrc: Oral                 Adna Nofziger S

## 2017-04-27 NOTE — Interval H&P Note (Signed)
History and Physical Interval Note:  04/27/2017 10:13 AM  Valerie Gay  has presented today for surgery, with the diagnosis of repeat EGD, esophageal ulcer  The various methods of treatment have been discussed with the patient and family. After consideration of risks, benefits and other options for treatment, the patient has consented to  Procedure(s): ESOPHAGOGASTRODUODENOSCOPY (EGD) WITH PROPOFOL (N/A) as a surgical intervention .  The patient's history has been reviewed, patient examined, no change in status, stable for surgery.  I have reviewed the patient's chart and labs.  Questions were answered to the patient's satisfaction.     Renelda Loma Takeia Ciaravino

## 2017-04-27 NOTE — Transfer of Care (Signed)
Immediate Anesthesia Transfer of Care Note  Patient: Valerie Gay  Procedure(s) Performed: Procedure(s): ESOPHAGOGASTRODUODENOSCOPY (EGD) WITH PROPOFOL (N/A)  Patient Location: PACU  Anesthesia Type:MAC  Level of Consciousness: sedated  Airway & Oxygen Therapy: Patient Spontanous Breathing and Patient connected to nasal cannula oxygen  Post-op Assessment: Report given to RN and Post -op Vital signs reviewed and stable  Post vital signs: Reviewed and stable  Last Vitals:  Vitals:   04/27/17 0930  BP: (!) 149/56  Pulse: 100  Resp: (!) 26  Temp: 36.7 C    Last Pain:  Vitals:   04/27/17 0930  TempSrc: Oral         Complications: No apparent anesthesia complications

## 2017-04-28 ENCOUNTER — Encounter (HOSPITAL_COMMUNITY): Payer: Self-pay | Admitting: Gastroenterology

## 2017-05-28 NOTE — Addendum Note (Signed)
Addendum  created 05/28/17 1056 by Lyndle Herrlich, MD   Sign clinical note

## 2017-05-28 NOTE — Anesthesia Postprocedure Evaluation (Signed)
Anesthesia Post Note  Patient: Valerie Gay  Procedure(s) Performed: Procedure(s) (LRB): COLONOSCOPY (N/A) ESOPHAGOGASTRODUODENOSCOPY (EGD) (N/A)     Anesthesia Post Evaluation  Last Vitals:  Vitals:   02/19/17 1240 02/19/17 1250  BP: 136/70 140/66  Pulse: 89 85  Resp: (!) 24 (!) 24  Temp:      Last Pain:  Vitals:   02/22/17 1306  TempSrc:   PainSc: 0-No pain                 Chelci Wintermute EDWARD

## 2017-06-07 ENCOUNTER — Telehealth: Payer: Self-pay | Admitting: Internal Medicine

## 2017-06-07 NOTE — Telephone Encounter (Signed)
Spoke with patient. She is aware that she has an appt with MR on 7/25. She was wanting to know if MR could send in a round of prednisone prior to her appt. She stated that she feels like she is having a COPD flare up. She is starting  Advised her that since I do not see prednisone on her chart, I will need to ask MR first.   She wishes to use [facility] on [address].   MR, please advise. Thanks.

## 2017-06-08 MED ORDER — PREDNISONE 10 MG PO TABS: ORAL_TABLET | ORAL | 0 refills | Status: DC

## 2017-06-08 NOTE — Telephone Encounter (Signed)
Spoke with patient. She is aware of prednisone. RX will be called into [facility] on [address].

## 2017-06-08 NOTE — Telephone Encounter (Signed)
Ok for prednisone for aecopd - Please take prednisone 40 mg x1 day, then 30 mg x1 day, then 20 mg x1 day, then 10 mg x1 day, and then 5 mg x1 day and stop   Dr. Brand Males, M.D., [facility].C.P Pulmonary and Critical Care Medicine Staff Physician [facility] Pager: [phone], If no answer or between  15:00h - 7:00h: call [phone]  06/08/2017 7:55 AM

## 2017-06-09 ENCOUNTER — Ambulatory Visit (INDEPENDENT_AMBULATORY_CARE_PROVIDER_SITE_OTHER): Payer: Medicare Other | Admitting: Internal Medicine

## 2017-06-09 ENCOUNTER — Encounter: Payer: Self-pay | Admitting: Internal Medicine

## 2017-06-09 VITALS — BP 138/78 | HR 77 | Ht 62.5 in | Wt 135.4 lb

## 2017-06-09 DIAGNOSIS — J449 Chronic obstructive pulmonary disease, unspecified: Secondary | ICD-10-CM | POA: Diagnosis not present

## 2017-06-09 DIAGNOSIS — I2584 Coronary atherosclerosis due to calcified coronary lesion: Secondary | ICD-10-CM | POA: Diagnosis not present

## 2017-06-09 DIAGNOSIS — J441 Chronic obstructive pulmonary disease with (acute) exacerbation: Secondary | ICD-10-CM

## 2017-06-09 DIAGNOSIS — I2510 Atherosclerotic heart disease of native coronary artery without angina pectoris: Secondary | ICD-10-CM | POA: Diagnosis not present

## 2017-06-09 DIAGNOSIS — F17200 Nicotine dependence, unspecified, uncomplicated: Secondary | ICD-10-CM | POA: Diagnosis not present

## 2017-06-09 MED ORDER — CEPHALEXIN 500 MG PO CAPS: 500.0000 mg | ORAL_CAPSULE | Freq: Three times a day (TID) | ORAL | 0 refills | Status: DC

## 2017-06-09 NOTE — Addendum Note (Signed)
Addended by: Benson Setting L on: 06/09/2017 11:20 AM   Modules accepted: Orders

## 2017-06-09 NOTE — Progress Notes (Signed)
Subjective:     Patient ID: Valerie Gay, female   DOB: 1941-02-06, 76 y.o.   MRN: [id number]  HPI  OV 09/27/2015  Chief Complaint  Patient presents with  . Follow-up    Pt states her breathing is unchanged since last OV. Pt states she has intermittent chest tightness when SOB. Pt states her SOB is at baseline.  Pt denies cough.    #COPD - moderate - gold stage 2; borderline gold stg 3 #Smoking # RUL lung nodule - 54mm  June 2015     OV 11/28/2014  Chief Complaint  Patient presents with  . Follow-up    Pt c/o stable sob with exertion.  CAT score 17.  #COPD - moderate - gold stage 2; borderline gold stg 3  - Do to supposedly allergy from Spiriva she is now only on Symbicort. Overall COPD stable. COPD cat score is 17. Pulmonary rehabilitation initiation phase has helped her. She is going to start the maintenance phase. She is up-to-date with her vaccines. She thinks one more inhaler can help her. Not interested in research studies.   #Smoking: Continues to smoke refuses to quit   # RUL lung nodule - 55mm  June 2015 . Follow-up CT scan of the chest and January 2016 is stable.    02/26/2015 Follow up /O2 qualification  Patient returns for a two-month follow-up for COPD. She is maintained on Symbicort and INCRUSE.  She is here today for oxygen qualification. Patient does desaturate with walking to 88% on room air. She is on 3 L with activity and at bedtime.. She says overall her breathing is stable without flare of cough or shortness of breath. She denies any hemoptysis, chest pain, orthopnea, PND or leg swelling She has a known right upper lobe lung nodule 5 mm which was stable on follow-up CT chest. She has a follow-up CT chest in September 2016. She does continue to smoke, smoking cessation was discussed.  OV 09/27/2015  Chief Complaint  Patient presents with  . Follow-up    Pt states her breathing is unchanged since last OV. Pt states she has intermittent chest  tightness when SOB. Pt states her SOB is at baseline.  Pt denies cough.    COPD: stable. No issues. Doing well on triple MDI. NEeds flu shot   SMoking:  reports that she has been smoking Cigarettes.  She has a 30 pack-year smoking history. She has never used smokeless tobacco.   Lun gnodule  - personally visualizeed  IMPRESSION: 1. 5 mm right upper lobe nodule is unchanged from 05/15/2014 and is therefore likely benign. Additionally, 18-24 months of documented stability is recommended. Additional follow-up could be performed in 12 months to ensure stability, as clinically indicated. This recommendation follows the consensus statement: Guidelines for Management of Small Pulmonary Nodules Detected on CT Scans: A Statement from the [employer] as published in Radiology 2005; 237:395-400. Online at: https://www.arnold.com/. 2. Moderate to severe centrilobular emphysema. 3. Three-vessel coronary artery calcification. 4. Tiny bilateral renal stones.   Electronically Signed  By: Lorin Picket M.D.  On: 09/18/2015 12:31    OV 10/05/2016  Chief Complaint  Patient presents with  . Follow-up    Pt here for yearly f/u. Pt c/o increase in SOB x 2-3 days. Also c/o prod coughand chest tightness x 2-3 days. Pt denies f/c/s.    76 year old female with moderate COPD. Presents for yearly follow-up.  COPD: Overall stable but the last 2 days she's had increased cough, chest congestion,  chest tightness, wheezing compared to baseline. In addition she is at increased sputum volume. She does not know the sputum color. This no hemoptysis or fever or chills. Does denies any sick contacts. She's not had had a flu shot and was hoping to get a flu shot today. She's completed rehabilitation.  Right upper lobe lung nodule:  November 2017 CT chest shows it stable since 2015.  Smoking: She continues to smoke. Finds it difficult to quit.  Incidental findings on  CT chest: She does have coronary artery calcification. She had last year too. Denies any chest pain. She does not recollect any cardiac stress test. Review of the chart quickly does not show any cardiology evaluation. However she says she was subjected to some test where she cannot lie supine and therefore it was canceled. Do not know the details. She also has mild renal stones but she is aware of this. They're asymptomatic.    IMPRESSION:ctg chest 09/21/16   1. Right upper lobe nodule is unchanged from 05/15/2014 and is therefore considered benign. 2.  Emphysema (ICD10-J43.9). 3. Aortic atherosclerosis (ICD10-170.0). Coronary artery calcification. 4. Right adrenal adenoma. 5. Bilateral renal stones.   Electronically Signed   By: Lorin Picket M.D.   On: 09/21/2016 11:17   OV 06/09/2017  Chief Complaint  Patient presents with  . Follow-up    Pt c/o increase in SOB, prod cough with white mucus. Pt states she started on a pred taper yesterday. Pt c/o chest tightness. Pt denies f/c/s.    Follow-up moderate COPD and ongoing smoking  She called in yesterday prior to the scheduled appointment with symptoms of COPD exacerbation which is been going on for the last 2 days before days. Has increased cough, chest tightness, wheezing and shortness of breath but no change in sputum production. Symptoms are rated as moderate in intensity. She is compliant with her triple inhaler therapy for COPD. Unfortunately she continues to smoke. She thinks a rhinitis as well as caused exacerbation although the sinus drainage is clear. She started prednisone yesterday based on phone call and she's beginning to feel better. There is no colored sputum no edema and no hemoptysis no fever or chills     CAT COPD Symptom & Quality of Life Score (GSK trademark) 0 is no burden. 5 is highest burden 10/05/2016   Never Cough -> Cough all the time 3  No phlegm in chest -> Chest is full of phlegm 3  No chest  tightness -> Chest feels very tight 3  No dyspnea for 1 flight stairs/hill -> Very dyspneic for 1 flight of stairs 4  No limitations for ADL at home -> Very limited with ADL at home 3  Confident leaving home -> Not at all confident leaving home 3  Sleep soundly -> Do not sleep soundly because of lung condition 3  Lots of Energy -> No energy at all 3  TOTAL Score (max 40)  25        has a past medical history of Anemia (age 43); Cellulitis and abscess of foot (03/2017); Colon polyps; COPD (chronic obstructive pulmonary disease) ([place]); Diverticulosis; History of hiatal hernia; History of kidney stones; Hyperlipidemia; and On home oxygen therapy.   reports that she has been smoking Cigarettes.  She has a 30.00 pack-year smoking history. She has never used smokeless tobacco.  Past Surgical History:  Procedure Laterality Date  . BALLOON DILATION N/A 03/08/2017   Procedure: BALLOON DILATION;  Surgeon: Manus Gunning, MD;  Location: [facility];  Service: Gastroenterology;  Laterality: N/A;  . CATARACT EXTRACTION Right    August 15, 2013  . CHOLECYSTECTOMY N/A 06/22/2014   Procedure: LAPAROSCOPIC CHOLECYSTECTOMY WITH INTRAOPERATIVE CHOLANGIOGRAM;  Surgeon: Gayland Curry, MD;  Location: [facility];  Service: General;  Laterality: N/A;  . COLONOSCOPY N/A 02/19/2017   Procedure: COLONOSCOPY;  Surgeon: Manus Gunning, MD;  Location: [facility];  Service: Gastroenterology;  Laterality: N/A;  . ESOPHAGOGASTRODUODENOSCOPY N/A 02/19/2017   Procedure: ESOPHAGOGASTRODUODENOSCOPY (EGD);  Surgeon: Manus Gunning, MD;  Location: [facility];  Service: Gastroenterology;  Laterality: N/A;  . ESOPHAGOGASTRODUODENOSCOPY N/A 03/08/2017   Procedure: ESOPHAGOGASTRODUODENOSCOPY (EGD);  Surgeon: Manus Gunning, MD;  Location: [facility];  Service: Gastroenterology;  Laterality: N/A;  . ESOPHAGOGASTRODUODENOSCOPY N/A 04/06/2017   Procedure: ESOPHAGOGASTRODUODENOSCOPY (EGD);  Surgeon:  Manus Gunning, MD;  Location: [facility];  Service: Gastroenterology;  Laterality: N/A;  . ESOPHAGOGASTRODUODENOSCOPY (EGD) WITH PROPOFOL N/A 04/27/2017   Procedure: ESOPHAGOGASTRODUODENOSCOPY (EGD) WITH PROPOFOL;  Surgeon: Manus Gunning, MD;  Location: [facility];  Service: Gastroenterology;  Laterality: N/A;  . NM MYOVIEW LTD  10/2016    EF 55-65% with normal LV function. LOW RISK. No ischemia or infarction.  . TONSILLECTOMY      Allergies  Allergen Reactions  . Spiriva Handihaler [Tiotropium Bromide Monohydrate] Hives, Itching and Rash     on upper body  . Banana Itching    Immunization History  Administered Date(s) Administered  . H1N1 11/05/2008  . Influenza Whole 07/29/2014  . Influenza,inj,Quad PF,36+ Mos 09/27/2015, 12/01/2016  . Pneumococcal Polysaccharide-23 03/28/2014    Family History  Problem Relation Age of Onset  . Alzheimer's disease Mother   . Diabetes Mother   . Cancer Father   . Stomach cancer Paternal Grandmother   . Colon cancer Neg Hx   . Esophageal cancer Neg Hx      Current Outpatient Prescriptions:  .  acetaminophen (TYLENOL) 325 MG tablet, Take 2 tablets (650 mg total) by mouth every 6 (six) hours as needed for mild pain, moderate pain or fever (or Fever >/= 101)., Disp: , Rfl:  .  albuterol (PROVENTIL HFA;VENTOLIN HFA) 108 (90 Base) MCG/ACT inhaler, Inhale 2 puffs into the lungs every 6 (six) hours as needed for wheezing or shortness of breath., Disp: 1 Inhaler, Rfl: 5 .  albuterol (PROVENTIL) (2.5 MG/3ML) 0.083% nebulizer solution, Take 3 mLs (2.5 mg total) by nebulization every 6 (six) hours as needed for wheezing or shortness of breath. DJ: J44.9, Disp: 360 mL, Rfl: 5 .  INCRUSE ELLIPTA 62.5 MCG/INH AEPB, USE 1 INHALATION DAILY (Patient taking differently: USE 1 INHALATION DAILY AT 3PM), Disp: 90 each, Rfl: 3 .  nystatin (MYCOSTATIN/NYSTOP) powder, Apply 1 g topically daily as needed (fungus)., Disp: , Rfl:  .  OXYGEN,  Inhale 2 L into the lungs as needed (shortness of breath)., Disp: , Rfl:  .  predniSONE (DELTASONE) 10 MG tablet, Take 4 tabs x1day, 3 tabs x1day, 2 tab x1day, 1 tab x1 day, and then 1/2 tab x1., Disp: 11 tablet, Rfl: 0 .  SYMBICORT 160-4.5 MCG/ACT inhaler, USE 2 INHALATIONS TWICE A DAY, Disp: 30.6 g, Rfl: 6 .  Triamcinolone Acetonide (NASACORT ALLERGY 24HR NA), Place 1 spray into the nose daily as needed (allergies). , Disp: , Rfl:     Review of Systems     Objective:   Physical Exam  Constitutional: She is oriented to person, place, and time. She appears well-developed and well-nourished. No distress.  Looks a bit deconditioned  HENT:  Head: Normocephalic and atraumatic.  Right Ear: External ear normal.  Left Ear: External ear normal.  Mouth/Throat: Oropharynx is clear and moist. No oropharyngeal exudate.  Eyes: Pupils are equal, round, and reactive to light. Conjunctivae and EOM are normal. Right eye exhibits no discharge. Left eye exhibits no discharge. No scleral icterus.  Neck: Normal range of motion. Neck supple. No JVD present. No tracheal deviation present. No thyromegaly present.  Cardiovascular: Normal rate, regular rhythm, normal heart sounds and intact distal pulses.  Exam reveals no gallop and no friction rub.   No murmur heard. Pulmonary/Chest: Effort normal and breath sounds normal. No respiratory distress. She has no wheezes. She has no rales. She exhibits no tenderness.  barrell chest Purse lip breathing occ wheeze  Abdominal: Soft. Bowel sounds are normal. She exhibits no distension and no mass. There is no tenderness. There is no rebound and no guarding.  Musculoskeletal: Normal range of motion. She exhibits no edema or tenderness.  Lymphadenopathy:    She has no cervical adenopathy.  Neurological: She is alert and oriented to person, place, and time. She has normal reflexes. No cranial nerve deficit. She exhibits normal muscle tone. Coordination normal.  Skin:  Skin is warm and dry. No rash noted. She is not diaphoretic. No erythema. No pallor.  Psychiatric: She has a normal mood and affect. Her behavior is normal. Judgment and thought content normal.  Vitals reviewed.   Vitals:   06/09/17 1039  BP: 138/78  Pulse: 77  SpO2: 96%  Weight: 135 lb 6.4 oz (61.4 kg)  Height: 5' 2.5" (1.588 m)    Estimated body mass index is 24.37 kg/m as calculated from the following:   Height as of this encounter: 5' 2.5" (1.588 m).   Weight as of this encounter: 135 lb 6.4 oz (61.4 kg).      Assessment:       ICD-10-CM   1. COPD exacerbation ([place]) J44.1   2. COPD, moderate ([place]) J44.9   3. Tobacco use disorder F17.200        Plan:     1. COPD exacerbation (HCC) - cephalexin 500mg  three times daily x  5 days - continue and finish the 5 day prednisone you started yesterday  2. COPD, moderate ([place]) - continue incruse and symbicort scheduled as before  - next visit we can talk about medication for recurrent flare up - in 3 months do Pre-bd spiro and dlco only. No lung volume or bd response. No post-bd spiro (last pft was 3 years ago) - flu shot in fall - Please talk to PCP Kathyrn Lass, MD -  and ensure you get  shingarix vaccine   3. Tobacco use disorder Please work on quitting smoking  Followup 3 months or sooner with me or APP  Dr. Brand Males, M.D., [facility]C.P Pulmonary and Critical Care Medicine Staff Physician [place] [facility] Pager: [phone], If no answer or between  15:00h - 7:00h: call [phone]  06/09/2017 11:01 AM

## 2017-06-09 NOTE — Patient Instructions (Signed)
1. COPD exacerbation (HCC) - cephalexin 500mg  three times daily x  5 days - continue and finish the 5 day prednisone you started yesterday  2. COPD, moderate ([place]) - continue incruse and symbicort scheduled as before  - next visit we can talk about medication for recurrent flare up - in 3 months do Pre-bd spiro and dlco only. No lung volume or bd response. No post-bd spiro (last pft was 3 years ago) - flu shot in fall - Please talk to PCP Kathyrn Lass, MD -  and ensure you get  shingarix vaccine   3. Tobacco use disorder Please work on quitting smoking  Followup 3 months or sooner with me or APP

## 2017-06-10 DIAGNOSIS — H353212 Exudative age-related macular degeneration, right eye, with inactive choroidal neovascularization: Secondary | ICD-10-CM | POA: Diagnosis not present

## 2017-06-10 DIAGNOSIS — H353222 Exudative age-related macular degeneration, left eye, with inactive choroidal neovascularization: Secondary | ICD-10-CM | POA: Diagnosis not present

## 2017-06-10 DIAGNOSIS — H43811 Vitreous degeneration, right eye: Secondary | ICD-10-CM | POA: Diagnosis not present

## 2017-06-15 ENCOUNTER — Telehealth: Payer: Self-pay | Admitting: Internal Medicine

## 2017-06-15 MED ORDER — ALBUTEROL SULFATE HFA 108 (90 BASE) MCG/ACT IN AERS
2.0000 | INHALATION_SPRAY | Freq: Four times a day (QID) | RESPIRATORY_TRACT | 1 refills | Status: DC | PRN
Start: 2017-06-15 — End: 2018-08-17

## 2017-06-15 NOTE — Telephone Encounter (Signed)
Left message for patient that Rx has been sent to [facility] as requested and if any further questions or concerns please contact the office. Nothing more needed at this time.

## 2017-09-14 ENCOUNTER — Ambulatory Visit: Payer: Medicare Other | Admitting: Internal Medicine

## 2017-09-16 DIAGNOSIS — H353212 Exudative age-related macular degeneration, right eye, with inactive choroidal neovascularization: Secondary | ICD-10-CM | POA: Diagnosis not present

## 2017-09-16 DIAGNOSIS — H43811 Vitreous degeneration, right eye: Secondary | ICD-10-CM | POA: Diagnosis not present

## 2017-09-21 ENCOUNTER — Other Ambulatory Visit: Payer: Self-pay | Admitting: Internal Medicine

## 2017-11-12 ENCOUNTER — Telehealth: Payer: Self-pay | Admitting: Internal Medicine

## 2017-11-12 MED ORDER — ALBUTEROL SULFATE (2.5 MG/3ML) 0.083% IN NEBU: INHALATION_SOLUTION | RESPIRATORY_TRACT | 5 refills | Status: DC

## 2017-11-12 NOTE — Telephone Encounter (Signed)
Pt requesting refill on albuterol neb.  This has been sent to preferred pharmacy.  Nothing further needed.  

## 2017-12-05 ENCOUNTER — Other Ambulatory Visit: Payer: Self-pay | Admitting: Internal Medicine

## 2017-12-21 ENCOUNTER — Other Ambulatory Visit: Payer: Self-pay | Admitting: *Deleted

## 2017-12-21 MED ORDER — UMECLIDINIUM BROMIDE 62.5 MCG/INH IN AEPB: 1.0000 | INHALATION_SPRAY | Freq: Every day | RESPIRATORY_TRACT | 3 refills | Status: DC

## 2017-12-23 ENCOUNTER — Other Ambulatory Visit: Payer: Self-pay | Admitting: *Deleted

## 2017-12-23 MED ORDER — BUDESONIDE-FORMOTEROL FUMARATE 160-4.5 MCG/ACT IN AERO: INHALATION_SPRAY | RESPIRATORY_TRACT | 6 refills | Status: DC

## 2018-01-27 DIAGNOSIS — H353121 Nonexudative age-related macular degeneration, left eye, early dry stage: Secondary | ICD-10-CM | POA: Diagnosis not present

## 2018-01-27 DIAGNOSIS — H43811 Vitreous degeneration, right eye: Secondary | ICD-10-CM | POA: Diagnosis not present

## 2018-01-27 DIAGNOSIS — H353212 Exudative age-related macular degeneration, right eye, with inactive choroidal neovascularization: Secondary | ICD-10-CM | POA: Diagnosis not present

## 2018-03-10 DIAGNOSIS — H1789 Other corneal scars and opacities: Secondary | ICD-10-CM | POA: Diagnosis not present

## 2018-03-10 DIAGNOSIS — H18832 Recurrent erosion of cornea, left eye: Secondary | ICD-10-CM | POA: Diagnosis not present

## 2018-03-17 DIAGNOSIS — H1789 Other corneal scars and opacities: Secondary | ICD-10-CM | POA: Diagnosis not present

## 2018-06-29 DIAGNOSIS — H353211 Exudative age-related macular degeneration, right eye, with active choroidal neovascularization: Secondary | ICD-10-CM | POA: Diagnosis not present

## 2018-06-29 DIAGNOSIS — H353121 Nonexudative age-related macular degeneration, left eye, early dry stage: Secondary | ICD-10-CM | POA: Diagnosis not present

## 2018-06-29 DIAGNOSIS — H43811 Vitreous degeneration, right eye: Secondary | ICD-10-CM | POA: Diagnosis not present

## 2018-07-29 ENCOUNTER — Other Ambulatory Visit: Payer: Self-pay | Admitting: Internal Medicine

## 2018-08-01 ENCOUNTER — Other Ambulatory Visit: Payer: Self-pay | Admitting: Internal Medicine

## 2018-08-01 ENCOUNTER — Telehealth: Payer: Self-pay | Admitting: Internal Medicine

## 2018-08-01 MED ORDER — ALBUTEROL SULFATE (2.5 MG/3ML) 0.083% IN NEBU: INHALATION_SOLUTION | RESPIRATORY_TRACT | 0 refills | Status: DC

## 2018-08-01 NOTE — Telephone Encounter (Signed)
Spoke with pt. She is needing a refill on Albuterol Neb Solution. Advised her that she has not been seen in over 1 year. Pt will need an appointment. OV has been scheduled on 08/17/18 at 10:45am with MR. Rx has been sent in. Nothing further was needed at this time.

## 2018-08-16 DIAGNOSIS — H353211 Exudative age-related macular degeneration, right eye, with active choroidal neovascularization: Secondary | ICD-10-CM | POA: Diagnosis not present

## 2018-08-17 ENCOUNTER — Ambulatory Visit (INDEPENDENT_AMBULATORY_CARE_PROVIDER_SITE_OTHER): Payer: Medicare Other | Admitting: Internal Medicine

## 2018-08-17 ENCOUNTER — Encounter: Payer: Self-pay | Admitting: Internal Medicine

## 2018-08-17 VITALS — BP 108/64 | HR 93 | Ht 62.0 in | Wt 118.2 lb

## 2018-08-17 DIAGNOSIS — J441 Chronic obstructive pulmonary disease with (acute) exacerbation: Secondary | ICD-10-CM

## 2018-08-17 DIAGNOSIS — F17200 Nicotine dependence, unspecified, uncomplicated: Secondary | ICD-10-CM | POA: Diagnosis not present

## 2018-08-17 DIAGNOSIS — J449 Chronic obstructive pulmonary disease, unspecified: Secondary | ICD-10-CM | POA: Diagnosis not present

## 2018-08-17 MED ORDER — ALBUTEROL SULFATE HFA 108 (90 BASE) MCG/ACT IN AERS: 2.0000 | INHALATION_SPRAY | Freq: Four times a day (QID) | RESPIRATORY_TRACT | 5 refills | Status: DC | PRN

## 2018-08-17 MED ORDER — PREDNISONE 10 MG PO TABS: ORAL_TABLET | ORAL | 0 refills | Status: DC

## 2018-08-17 MED ORDER — DOXYCYCLINE HYCLATE 100 MG PO TABS: 100.0000 mg | ORAL_TABLET | Freq: Two times a day (BID) | ORAL | 0 refills | Status: DC

## 2018-08-17 NOTE — Addendum Note (Signed)
Addended by: Nena Polio on: 08/17/2018 11:57 AM   Modules accepted: Orders

## 2018-08-17 NOTE — Patient Instructions (Addendum)
ICD-10-CM   1. COPD exacerbation ([place]) J44.1   2. COPD, moderate ([place]) J44.9   3. Tobacco use disorder F17.200     Take doxycycline 100mg  po twice daily x 5 days; take after meals and avoid sunlight Please take prednisone 40 mg x1 day, then 30 mg x1 day, then 20 mg x1 day, then 10 mg x1 day, and then 5 mg x1 day and stop  Continue Symbicort and Incruse as scheduled with albuterol as needed  Flu shot next week  Also, Please talk to PCP Kathyrn Lass, MD -  and ensure you get  shingrix ([place]) inactivated vaccine against shingles  Try to work on quitting smoking  Follow-up - 6 months or sooner if needed; CAT score at follow-up

## 2018-08-17 NOTE — Progress Notes (Signed)
OV 09/27/2015  Chief Complaint  Patient presents with  . Follow-up    Pt states her breathing is unchanged since last OV. Pt states she has intermittent chest tightness when SOB. Pt states her SOB is at baseline.  Pt denies cough.    #COPD - moderate - gold stage 2; borderline gold stg 3 #Smoking # RUL lung nodule - 10mm  June 2015     OV 11/28/2014  Chief Complaint  Patient presents with  . Follow-up    Pt c/o stable sob with exertion.  CAT score 17.  #COPD - moderate - gold stage 2; borderline gold stg 3  - Do to supposedly allergy from Spiriva she is now only on Symbicort. Overall COPD stable. COPD cat score is 17. Pulmonary rehabilitation initiation phase has helped her. She is going to start the maintenance phase. She is up-to-date with her vaccines. She thinks one more inhaler can help her. Not interested in research studies.   #Smoking: Continues to smoke refuses to quit   # RUL lung nodule - 72mm  June 2015 . Follow-up CT scan of the chest and January 2016 is stable.    02/26/2015 Follow up /O2 qualification  Patient returns for a two-month follow-up for COPD. She is maintained on Symbicort and INCRUSE.  She is here today for oxygen qualification. Patient does desaturate with walking to 88% on room air. She is on 3 L with activity and at bedtime.. She says overall her breathing is stable without flare of cough or shortness of breath. She denies any hemoptysis, chest pain, orthopnea, PND or leg swelling She has a known right upper lobe lung nodule 5 mm which was stable on follow-up CT chest. She has a follow-up CT chest in September 2016. She does continue to smoke, smoking cessation was discussed.  OV 09/27/2015  Chief Complaint  Patient presents with  . Follow-up    Pt states her breathing is unchanged since last OV. Pt states she has intermittent chest tightness when SOB. Pt states her SOB is at baseline.  Pt denies cough.    COPD: stable. No issues.  Doing well on triple MDI. NEeds flu shot   SMoking:  reports that she has been smoking Cigarettes.  She has a 30 pack-year smoking history. She has never used smokeless tobacco.   Lun gnodule  - personally visualizeed  IMPRESSION: 1. 5 mm right upper lobe nodule is unchanged from 05/15/2014 and is therefore likely benign. Additionally, 18-24 months of documented stability is recommended. Additional follow-up could be performed in 12 months to ensure stability, as clinically indicated. This recommendation follows the consensus statement: Guidelines for Management of Small Pulmonary Nodules Detected on CT Scans: A Statement from the [employer] as published in Radiology 2005; 237:395-400. Online at: https://www.arnold.com/. 2. Moderate to severe centrilobular emphysema. 3. Three-vessel coronary artery calcification. 4. Tiny bilateral renal stones.   Electronically Signed  By: Lorin Picket M.D.  On: 09/18/2015 12:31    OV 10/05/2016  Chief Complaint  Patient presents with  . Follow-up    Pt here for yearly f/u. Pt c/o increase in SOB x 2-3 days. Also c/o prod coughand chest tightness x 2-3 days. Pt denies f/c/s.    77 year old female with moderate COPD. Presents for yearly follow-up.  COPD: Overall stable but the last 2 days she's had increased cough, chest congestion, chest tightness, wheezing compared to baseline. In addition she is at increased sputum volume. She does not know the sputum color.  This no hemoptysis or fever or chills. Does denies any sick contacts. She's not had had a flu shot and was hoping to get a flu shot today. She's completed rehabilitation.  Right upper lobe lung nodule:  November 2017 CT chest shows it stable since 2015.  Smoking: She continues to smoke. Finds it difficult to quit.  Incidental findings on CT chest: She does have coronary artery calcification. She had last year too. Denies any chest pain.  She does not recollect any cardiac stress test. Review of the chart quickly does not show any cardiology evaluation. However she says she was subjected to some test where she cannot lie supine and therefore it was canceled. Do not know the details. She also has mild renal stones but she is aware of this. They're asymptomatic.    IMPRESSION:ctg chest 09/21/16   1. Right upper lobe nodule is unchanged from 05/15/2014 and is therefore considered benign. 2.  Emphysema (ICD10-J43.9). 3. Aortic atherosclerosis (ICD10-170.0). Coronary artery calcification. 4. Right adrenal adenoma. 5. Bilateral renal stones.   Electronically Signed   By: Lorin Picket M.D.   On: 09/21/2016 11:17   OV 06/09/2017  Chief Complaint  Patient presents with  . Follow-up    Pt c/o increase in SOB, prod cough with white mucus. Pt states she started on a pred taper yesterday. Pt c/o chest tightness. Pt denies f/c/s.    Follow-up moderate COPD and ongoing smoking  She called in yesterday prior to the scheduled appointment with symptoms of COPD exacerbation which is been going on for the last 2 days before days. Has increased cough, chest tightness, wheezing and shortness of breath but no change in sputum production. Symptoms are rated as moderate in intensity. She is compliant with her triple inhaler therapy for COPD. Unfortunately she continues to smoke. She thinks a rhinitis as well as caused exacerbation although the sinus drainage is clear. She started prednisone yesterday based on phone call and she's beginning to feel better. There is no colored sputum no edema and no hemoptysis no fever or chills      OV 08/17/2018  Subjective:  Patient ID: Valerie Gay, female , DOB: [birth year] , age 77 y.o. , MRN: [id number] , ADDRESS: [place] [id number]   08/17/2018 -   Chief Complaint  Patient presents with  . Follow-up    Pt states she has been having some more problems with SOB and a lot  of phlegm. States she occ coughs and when she does, she will bring up phlegm which she states is becoming yellow in color. States she has had some chest tightness.       HPI Valerie Gay 77 y.o. -  follow-up moderate COPD.,  Recent exacerbation and ongoing smoking  She continues to do well overall but in the last week has increased cough and congestion.  She feels that the onset of the fall season she might be getting a flareup.  She prefers to take antibiotic and prednisone.  She says she is compliant with Symbicort and Incruse.  She wants to defer the flu shot the next week.  She had to quit smoking.  Last CT scan of the chest was a year ago and she had a stable lung nodule for several years.  She is not eligible anymore for lung cancer screening because she is over 79 years of age.  COPD CAT score is documented below and is stable although the cough and dyspnea scores are slightly worse.  CAT COPD Symptom & Quality of Life Score (GSK trademark) 0 is no burden. 5 is highest burden 10/05/2016  08/17/2018   Never Cough -> Cough all the time 3 3  No phlegm in chest -> Chest is full of phlegm 3 4  No chest tightness -> Chest feels very tight 3 3  No dyspnea for 1 flight stairs/hill -> Very dyspneic for 1 flight of stairs 4 5  No limitations for ADL at home -> Very limited with ADL at home 3 1  Confident leaving home -> Not at all confident leaving home 3 3  Sleep soundly -> Do not sleep soundly because of lung condition 3 3  Lots of Energy -> No energy at all 3 3  TOTAL Score (max 40)  25 25     ROS - per HPI     has a past medical history of Anemia (age 3), Cellulitis and abscess of foot (03/2017), Colon polyps, COPD (chronic obstructive pulmonary disease) ([place]), Diverticulosis, History of hiatal hernia, History of kidney stones, Hyperlipidemia, and On home oxygen therapy.   reports that she has been smoking cigarettes. She has a 30.00 pack-year smoking history. She has  never used smokeless tobacco.  Past Surgical History:  Procedure Laterality Date  . BALLOON DILATION N/A 03/08/2017   Procedure: BALLOON DILATION;  Surgeon: Manus Gunning, MD;  Location: [facility];  Service: Gastroenterology;  Laterality: N/A;  . CATARACT EXTRACTION Right    August 15, 2013  . CHOLECYSTECTOMY N/A 06/22/2014   Procedure: LAPAROSCOPIC CHOLECYSTECTOMY WITH INTRAOPERATIVE CHOLANGIOGRAM;  Surgeon: Gayland Curry, MD;  Location: [facility];  Service: General;  Laterality: N/A;  . COLONOSCOPY N/A 02/19/2017   Procedure: COLONOSCOPY;  Surgeon: Manus Gunning, MD;  Location: [facility];  Service: Gastroenterology;  Laterality: N/A;  . ESOPHAGOGASTRODUODENOSCOPY N/A 02/19/2017   Procedure: ESOPHAGOGASTRODUODENOSCOPY (EGD);  Surgeon: Manus Gunning, MD;  Location: [facility];  Service: Gastroenterology;  Laterality: N/A;  . ESOPHAGOGASTRODUODENOSCOPY N/A 03/08/2017   Procedure: ESOPHAGOGASTRODUODENOSCOPY (EGD);  Surgeon: Manus Gunning, MD;  Location: [facility];  Service: Gastroenterology;  Laterality: N/A;  . ESOPHAGOGASTRODUODENOSCOPY N/A 04/06/2017   Procedure: ESOPHAGOGASTRODUODENOSCOPY (EGD);  Surgeon: Manus Gunning, MD;  Location: [facility];  Service: Gastroenterology;  Laterality: N/A;  . ESOPHAGOGASTRODUODENOSCOPY (EGD) WITH PROPOFOL N/A 04/27/2017   Procedure: ESOPHAGOGASTRODUODENOSCOPY (EGD) WITH PROPOFOL;  Surgeon: Manus Gunning, MD;  [facility]: [facility];  Service: Gastroenterology;  Laterality: N/A;  . NM MYOVIEW LTD  10/2016    EF 55-65% with normal LV function. LOW RISK. No ischemia or infarction.  . TONSILLECTOMY      Allergies  Allergen Reactions  . Spiriva Handihaler [Tiotropium Bromide Monohydrate] Hives, Itching and Rash     on upper body  . Banana Itching    Immunization History  Administered Date(s) Administered  . H1N1 11/05/2008  . Influenza Whole 07/29/2014  . Influenza,inj,Quad PF,6+ Mos  09/27/2015, 12/01/2016  . Pneumococcal Polysaccharide-23 03/28/2014    Family History  Problem Relation Age of Onset  . Alzheimer's disease Mother   . Diabetes Mother   . Cancer Father   . Stomach cancer Paternal Grandmother   . Colon cancer Neg Hx   . Esophageal cancer Neg Hx      Current Outpatient Medications:  .  acetaminophen (TYLENOL) 325 MG tablet, Take 2 tablets (650 mg total) by mouth every 6 (six) hours as needed for mild pain, moderate pain or fever (or Fever >/= 101)., Disp: , Rfl:  .  albuterol (PROVENTIL  HFA;VENTOLIN HFA) 108 (90 Base) MCG/ACT inhaler, Inhale 2 puffs into the lungs every 6 (six) hours as needed for wheezing or shortness of breath., Disp: 3 Inhaler, Rfl: 1 .  albuterol (PROVENTIL) (2.5 MG/3ML) 0.083% nebulizer solution, INHALE CONTENTS OF 1 VIAL IN NEBULIZER EVERY 8 HOURS IF NEEDED FOR WHEEZING., Disp: 360 mL, Rfl: 0 .  budesonide-formoterol (SYMBICORT) 160-4.5 MCG/ACT inhaler, USE 2 INHALATIONS TWICE A DAY, Disp: 30.6 g, Rfl: 6 .  co-enzyme Q-10 50 MG capsule, Take 50 mg by mouth daily., Disp: , Rfl:  .  Multiple Vitamins-Minerals (PRESERVISION AREDS 2) CAPS, Take 2 capsules by mouth daily., Disp: , Rfl:  .  OXYGEN, Inhale 2 L into the lungs as needed (shortness of breath)., Disp: , Rfl:  .  Triamcinolone Acetonide (NASACORT ALLERGY 24HR NA), Place 1 spray into the nose daily as needed (allergies). , Disp: , Rfl:  .  umeclidinium bromide (INCRUSE ELLIPTA) 62.5 MCG/INH AEPB, Inhale 1 puff into the lungs daily., Disp: 90 each, Rfl: 3      Objective:   Vitals:   08/17/18 1112  BP: 108/64  Pulse: 93  SpO2: 95%  Weight: 118 lb 3.2 oz (53.6 kg)  Height: 5\' 2"  (1.575 m)    Estimated body mass index is 21.62 kg/m as calculated from the following:   Height as of this encounter: 5\' 2"  (1.575 m).   Weight as of this encounter: 118 lb 3.2 oz (53.6 kg).  @WEIGHTCHANGE @  [employer]   08/17/18 1112  Weight: 118 lb 3.2 oz (53.6 kg)     Physical  Exam  General Appearance:    Alert, cooperative, no distress, appears stated age - yes , Deconditioned looking - y3s , OBESE  - no, Sitting on Wheelchair -  n  Head:    Normocephalic, without obvious abnormality, atraumatic  Eyes:    PERRL, conjunctiva/corneas clear,  Ears:    Normal TM's and external ear canals, both ears  Nose:   Nares normal, septum midline, mucosa normal, no drainage    or sinus tenderness. OXYGEN ON  - no . Patient is @ ra   Throat:   Lips, mucosa, and tongue normal; teeth and gums normal. Cyanosis on lips - no  Neck:   Supple, symmetrical, trachea midline, no adenopathy;    thyroid:  no enlargement/tenderness/nodules; no carotid   bruit or JVD  Back:     Symmetric, no curvature, ROM normal, no CVA tenderness  Lungs:     Distress - no , Wheeze no, Barrell Chest - yes, Purse lip breathing - yes, Crackles - no   Chest Wall:    No tenderness or deformity.    Heart:    Regular rate and rhythm, S1 and S2 normal, no rub   or gallop, Murmur - no  Breast Exam:    NOT DONE  Abdomen:     Soft, non-tender, bowel sounds active all four quadrants,    no masses, no organomegaly. Visceral obesity - no  Genitalia:   NOT DONE  Rectal:   NOT DONE  Extremities:   Extremities - normal, Has Cane - no, Clubbing - no, Edema - no  Pulses:   2+ and symmetric all extremities  Skin:   Stigmata of Connective Tissue Disease - no  Lymph nodes:   Cervical, supraclavicular, and axillary nodes normal  Psychiatric:  Neurologic:   Pleasant - yes, Anxious - n, Flat affect - no  CAm-ICU - neg, Alert and Oriented x 3 - yes, Moves all  4s - yes, Speech - normal, Cognition - intact           Assessment:       ICD-10-CM   1. COPD exacerbation ([place]) J44.1   2. COPD, moderate ([place]) J44.9   3. Tobacco use disorder F17.200        Plan:     Patient Instructions     ICD-10-CM   1. COPD exacerbation ([place]) J44.1   2. COPD, moderate ([place]) J44.9   3. Tobacco use disorder F17.200     Take  doxycycline 100mg  po twice daily x 5 days; take after meals and avoid sunlight Please take prednisone 40 mg x1 day, then 30 mg x1 day, then 20 mg x1 day, then 10 mg x1 day, and then 5 mg x1 day and stop  Continue Symbicort and Incruse as scheduled with albuterol as needed  Flu shot next week  Also, Please talk to PCP Kathyrn Lass, MD -  and ensure you get  shingrix ([place]) inactivated vaccine against shingles  Try to work on quitting smoking  Follow-up - 6 months or sooner if needed; CAT score at follow-up        SIGNATURE    Dr. Brand Males, M.D., F.C.C.P,  Pulmonary and Critical Care Medicine Staff Physician, [place] Director - Interstitial Lung Disease  Program  Pulmonary [place] at [place], [place], [address]  Pager: [phone], If no answer or between  15:00h - 7:00h: call [phone] Telephone: [phone]  11:35 AM 08/17/2018

## 2018-08-17 NOTE — Addendum Note (Signed)
Addended by: Lorretta Harp on: 08/17/2018 12:02 PM   Modules accepted: Orders

## 2018-08-18 ENCOUNTER — Other Ambulatory Visit: Payer: Self-pay | Admitting: Internal Medicine

## 2018-09-20 DIAGNOSIS — H353211 Exudative age-related macular degeneration, right eye, with active choroidal neovascularization: Secondary | ICD-10-CM | POA: Diagnosis not present

## 2018-10-14 DIAGNOSIS — J441 Chronic obstructive pulmonary disease with (acute) exacerbation: Secondary | ICD-10-CM | POA: Diagnosis not present

## 2018-11-01 DIAGNOSIS — H353122 Nonexudative age-related macular degeneration, left eye, intermediate dry stage: Secondary | ICD-10-CM | POA: Diagnosis not present

## 2018-11-01 DIAGNOSIS — H43811 Vitreous degeneration, right eye: Secondary | ICD-10-CM | POA: Diagnosis not present

## 2018-11-01 DIAGNOSIS — H353211 Exudative age-related macular degeneration, right eye, with active choroidal neovascularization: Secondary | ICD-10-CM | POA: Diagnosis not present

## 2018-11-13 ENCOUNTER — Other Ambulatory Visit: Payer: Self-pay | Admitting: Internal Medicine

## 2018-11-22 ENCOUNTER — Other Ambulatory Visit: Payer: Self-pay | Admitting: Internal Medicine

## 2018-12-08 ENCOUNTER — Emergency Department (HOSPITAL_COMMUNITY): Payer: Medicare Other

## 2018-12-08 ENCOUNTER — Observation Stay (HOSPITAL_COMMUNITY)
Admission: EM | Admit: 2018-12-08 | Discharge: 2018-12-10 | Disposition: A | Discharge status: Home / Self Care | Payer: Medicare Other | Attending: Internal Medicine | Admitting: Internal Medicine

## 2018-12-08 ENCOUNTER — Other Ambulatory Visit: Payer: Self-pay

## 2018-12-08 ENCOUNTER — Encounter (HOSPITAL_COMMUNITY): Payer: Self-pay | Admitting: Emergency Medicine

## 2018-12-08 DIAGNOSIS — Z87442 Personal history of urinary calculi: Secondary | ICD-10-CM | POA: Diagnosis not present

## 2018-12-08 DIAGNOSIS — F17210 Nicotine dependence, cigarettes, uncomplicated: Secondary | ICD-10-CM | POA: Insufficient documentation

## 2018-12-08 DIAGNOSIS — Z79899 Other long term (current) drug therapy: Secondary | ICD-10-CM | POA: Diagnosis not present

## 2018-12-08 DIAGNOSIS — I471 Supraventricular tachycardia: Secondary | ICD-10-CM | POA: Diagnosis not present

## 2018-12-08 DIAGNOSIS — E785 Hyperlipidemia, unspecified: Secondary | ICD-10-CM | POA: Insufficient documentation

## 2018-12-08 DIAGNOSIS — K219 Gastro-esophageal reflux disease without esophagitis: Secondary | ICD-10-CM | POA: Insufficient documentation

## 2018-12-08 DIAGNOSIS — J9620 Acute and chronic respiratory failure, unspecified whether with hypoxia or hypercapnia: Secondary | ICD-10-CM | POA: Diagnosis present

## 2018-12-08 DIAGNOSIS — E43 Unspecified severe protein-calorie malnutrition: Secondary | ICD-10-CM | POA: Diagnosis not present

## 2018-12-08 DIAGNOSIS — D649 Anemia, unspecified: Secondary | ICD-10-CM | POA: Diagnosis not present

## 2018-12-08 DIAGNOSIS — E876 Hypokalemia: Secondary | ICD-10-CM | POA: Diagnosis not present

## 2018-12-08 DIAGNOSIS — J9621 Acute and chronic respiratory failure with hypoxia: Secondary | ICD-10-CM | POA: Diagnosis not present

## 2018-12-08 DIAGNOSIS — I2510 Atherosclerotic heart disease of native coronary artery without angina pectoris: Secondary | ICD-10-CM | POA: Diagnosis not present

## 2018-12-08 DIAGNOSIS — J441 Chronic obstructive pulmonary disease with (acute) exacerbation: Principal | ICD-10-CM | POA: Insufficient documentation

## 2018-12-08 DIAGNOSIS — Z9981 Dependence on supplemental oxygen: Secondary | ICD-10-CM | POA: Insufficient documentation

## 2018-12-08 DIAGNOSIS — R0602 Shortness of breath: Secondary | ICD-10-CM | POA: Diagnosis not present

## 2018-12-08 LAB — CBC
HCT: 39.2 % (ref 36.0–46.0)
Hemoglobin: 12 g/dL (ref 12.0–15.0)
MCH: 30.9 pg (ref 26.0–34.0)
MCHC: 30.6 g/dL (ref 30.0–36.0)
MCV: 101 fL — ABNORMAL HIGH (ref 80.0–100.0)
PLATELETS: 440 10*3/uL — AB (ref 150–400)
RBC: 3.88 MIL/uL (ref 3.87–5.11)
RDW: 14.5 % (ref 11.5–15.5)
WBC: 11.5 10*3/uL — AB (ref 4.0–10.5)
nRBC: 0 % (ref 0.0–0.2)

## 2018-12-08 LAB — BASIC METABOLIC PANEL
Anion gap: 9 (ref 5–15)
BUN: 23 mg/dL (ref 8–23)
CO2: 31 mmol/L (ref 22–32)
Calcium: 9.4 mg/dL (ref 8.9–10.3)
Chloride: 100 mmol/L (ref 98–111)
Creatinine, Ser: 0.48 mg/dL (ref 0.44–1.00)
GFR calc Af Amer: >60 mL/min (ref >60)
GFR calc non Af Amer: >60 mL/min (ref >60)
Glucose, Bld: 143 mg/dL — ABNORMAL HIGH (ref 70–99)
Potassium: 3.3 mmol/L — ABNORMAL LOW (ref 3.5–5.1)
SODIUM: 140 mmol/L (ref 135–145)

## 2018-12-08 MED ORDER — INFLUENZA VAC SPLIT HIGH-DOSE 0.5 ML IM SUSY
0.5000 mL | PREFILLED_SYRINGE | INTRAMUSCULAR | Status: DC
  Filled 2018-12-08: qty 0.5

## 2018-12-08 MED ORDER — IPRATROPIUM-ALBUTEROL 0.5-2.5 (3) MG/3ML IN SOLN
3.0000 mL | Freq: Once | RESPIRATORY_TRACT | Status: AC
  Administered 2018-12-08: 3 mL via RESPIRATORY_TRACT
  Filled 2018-12-08: qty 3

## 2018-12-08 MED ORDER — ALBUTEROL SULFATE (2.5 MG/3ML) 0.083% IN NEBU
5.0000 mg | INHALATION_SOLUTION | Freq: Once | RESPIRATORY_TRACT | Status: AC
  Administered 2018-12-08: 5 mg via RESPIRATORY_TRACT
  Filled 2018-12-08: qty 6

## 2018-12-08 MED ORDER — IPRATROPIUM-ALBUTEROL 0.5-2.5 (3) MG/3ML IN SOLN: 3.0000 mL | Freq: Once | RESPIRATORY_TRACT | Status: DC

## 2018-12-08 MED ORDER — SODIUM CHLORIDE 0.9 % IV SOLN
1.0000 g | INTRAVENOUS | Status: DC
  Administered 2018-12-08: 1 g via INTRAVENOUS
  Filled 2018-12-08: qty 10

## 2018-12-08 MED ORDER — MAGNESIUM SULFATE 2 GM/50ML IV SOLN
2.0000 g | Freq: Once | INTRAVENOUS | Status: AC
  Administered 2018-12-08: 2 g via INTRAVENOUS
  Filled 2018-12-08: qty 50

## 2018-12-08 MED ORDER — METHYLPREDNISOLONE SODIUM SUCC 125 MG IJ SOLR
125.0000 mg | Freq: Once | INTRAMUSCULAR | Status: AC
  Administered 2018-12-08: 125 mg via INTRAVENOUS
  Filled 2018-12-08: qty 2

## 2018-12-08 MED ORDER — ENOXAPARIN SODIUM 40 MG/0.4ML ~~LOC~~ SOLN
40.0000 mg | SUBCUTANEOUS | Status: DC
  Administered 2018-12-09 (×2): 40 mg via SUBCUTANEOUS
  Filled 2018-12-08 (×2): qty 0.4

## 2018-12-08 MED ORDER — PREDNISONE 20 MG PO TABS
40.0000 mg | ORAL_TABLET | Freq: Every day | ORAL | Status: DC
  Administered 2018-12-09 – 2018-12-10 (×2): 40 mg via ORAL
  Filled 2018-12-08 (×2): qty 2

## 2018-12-08 MED ORDER — ALBUTEROL SULFATE (2.5 MG/3ML) 0.083% IN NEBU
2.5000 mg | INHALATION_SOLUTION | RESPIRATORY_TRACT | Status: DC | PRN
  Administered 2018-12-08 – 2018-12-10 (×5): 2.5 mg via RESPIRATORY_TRACT
  Filled 2018-12-08 (×5): qty 3

## 2018-12-08 MED ORDER — MOMETASONE FURO-FORMOTEROL FUM 200-5 MCG/ACT IN AERO
1.0000 | INHALATION_SPRAY | Freq: Two times a day (BID) | RESPIRATORY_TRACT | Status: DC
  Administered 2018-12-08 – 2018-12-09 (×2): 1 via RESPIRATORY_TRACT
  Filled 2018-12-08: qty 8.8

## 2018-12-08 NOTE — H&P (Signed)
History and Physical    Valerie Gay KWI:[id number] DOB: 1941-07-11 DOA: 12/08/2018  PCP: Kathyrn Lass, MD  Patient coming from: Home  I have personally briefly reviewed patient's old medical records in [place]  Chief Complaint: SOB  HPI: Valerie Gay is a 78 y.o. female with medical history significant of COPD on 3L home O2 at baseline.  Patient presents to the ED with c/o SOB and generalized weakness.  Symptoms onset suddenly today, severe, no fever, does have non-productive cough.  Not helped by home albuterol.  No sick contacts.   ED Course: CXR neg.  Symptoms somewhat improved after numerous neb treatments, steroids.   Review of Systems: As per HPI otherwise 10 point review of systems negative.   Past Medical History:  Diagnosis Date  . Anemia age 53  . Cellulitis and abscess of foot 03/2017   rt foot  . Colon polyps    2010   . COPD (chronic obstructive pulmonary disease) ([place])   . Diverticulosis   . History of hiatal hernia   . History of kidney stones   . Hyperlipidemia   . On home oxygen therapy     Past Surgical History:  Procedure Laterality Date  . BALLOON DILATION N/A 03/08/2017   Procedure: BALLOON DILATION;  Surgeon: Manus Gunning, MD;  Location: [facility];  Service: Gastroenterology;  Laterality: N/A;  . CATARACT EXTRACTION Right    August 15, 2013  . CHOLECYSTECTOMY N/A 06/22/2014   Procedure: LAPAROSCOPIC CHOLECYSTECTOMY WITH INTRAOPERATIVE CHOLANGIOGRAM;  Surgeon: Gayland Curry, MD;  Location: [facility];  Service: General;  Laterality: N/A;  . COLONOSCOPY N/A 02/19/2017   Procedure: COLONOSCOPY;  Surgeon: Manus Gunning, MD;  [facility];  Service: Gastroenterology;  Laterality: N/A;  . ESOPHAGOGASTRODUODENOSCOPY N/A 02/19/2017   Procedure: ESOPHAGOGASTRODUODENOSCOPY (EGD);  Surgeon: Manus Gunning, MD;  Location: [facility];  Service: Gastroenterology;  Laterality: N/A;  .  ESOPHAGOGASTRODUODENOSCOPY N/A 03/08/2017   Procedure: ESOPHAGOGASTRODUODENOSCOPY (EGD);  Surgeon: Manus Gunning, MD;  Location: [facility];  Service: Gastroenterology;  Laterality: N/A;  . ESOPHAGOGASTRODUODENOSCOPY N/A 04/06/2017   Procedure: ESOPHAGOGASTRODUODENOSCOPY (EGD);  Surgeon: Manus Gunning, MD;  Location: [facility];  Service: Gastroenterology;  Laterality: N/A;  . ESOPHAGOGASTRODUODENOSCOPY (EGD) WITH PROPOFOL N/A 04/27/2017   Procedure: ESOPHAGOGASTRODUODENOSCOPY (EGD) WITH PROPOFOL;  Surgeon: Manus Gunning, MD;  Location: [facility];  Service: Gastroenterology;  Laterality: N/A;  . NM MYOVIEW LTD  10/2016    EF 55-65% with normal LV function. LOW RISK. No ischemia or infarction.  . TONSILLECTOMY       reports that she has been smoking cigarettes. She has a 30.00 pack-year smoking history. She has never used smokeless tobacco. She reports that she does not drink alcohol or use drugs.  Allergies  Allergen Reactions  . Spiriva Handihaler [Tiotropium Bromide Monohydrate] Hives, Itching and Rash     on upper body  . Banana Itching    Family History  Problem Relation Age of Onset  . Alzheimer's disease Mother   . Diabetes Mother   . Cancer Father   . Stomach cancer Paternal Grandmother   . Colon cancer Neg Hx   . Esophageal cancer Neg Hx      Prior to Admission medications   Medication Sig Start Date End Date Taking? Authorizing Provider  acetaminophen (TYLENOL) 325 MG tablet Take 2 tablets (650 mg total) by mouth every 6 (six) hours as needed for mild pain, moderate pain or fever (or Fever >/= 101). 04/16/17  Hongalgi, Lenis Dickinson, MD  albuterol (PROVENTIL HFA;VENTOLIN HFA) 108 (90 Base) MCG/ACT inhaler Inhale 2 puffs into the lungs every 6 (six) hours as needed for wheezing or shortness of breath. 08/17/18   Brand Males, MD  albuterol (PROVENTIL) (2.5 MG/3ML) 0.083% nebulizer solution INHALE CONTENTS OF 1 VIAL IN NEBULIZER EVERY 8 HOURS IF  NEEDED FOR WHEEZING. 08/01/18   Brand Males, MD  albuterol (PROVENTIL) (2.5 MG/3ML) 0.083% nebulizer solution USE 1 VIAL VIA NEBULIZER EVERY 8 HOURS AS NEEDED FOR WHEEZING 11/22/18   Brand Males, MD  budesonide-formoterol [facility]) 160-4.5 MCG/ACT inhaler USE 2 INHALATIONS TWICE A DAY 12/23/17   Brand Males, MD  co-enzyme Q-10 50 MG capsule Take 50 mg by mouth daily.    [provider]  doxycycline (VIBRA-TABS) 100 MG tablet Take 1 tablet (100 mg total) by mouth 2 (two) times daily. 08/17/18   Brand Males, MD  INCRUSE ELLIPTA 62.5 MCG/INH AEPB USE 1 INHALATION DAILY 11/14/18   Brand Males, MD  Multiple Vitamins-Minerals (PRESERVISION AREDS 2) CAPS Take 2 capsules by mouth daily.    [provider]  OXYGEN Inhale 2 L into the lungs as needed (shortness of breath).    [provider]  predniSONE (DELTASONE) 10 MG tablet 40mg  x 1 day, 30mg  x 1 day, 20mg  x1 day, 10mg  x 1 day, 5 mg x 1 day 08/17/18   Brand Males, MD  Triamcinolone Acetonide (NASACORT ALLERGY 24HR NA) Place 1 spray into the nose daily as needed (allergies).     [provider]    Physical Exam: Vitals:   12/08/18 1429 12/08/18 1437 12/08/18 1758  BP: 102/66    Pulse: (!) 130    Resp: (!) 24    Temp: 97.6 F (36.4 C)    TempSrc: Oral    SpO2: 97% 97% 100%    Constitutional: NAD, calm, comfortable Eyes: PERRL, lids and conjunctivae normal ENMT: Mucous membranes are moist. Posterior pharynx clear of any exudate or lesions.Normal dentition.  Neck: normal, supple, no masses, no thyromegaly Respiratory: Diffuse wheezing Cardiovascular: Mild tachycardia  Abdomen: no tenderness, no masses palpated. No hepatosplenomegaly. Bowel sounds positive.  Musculoskeletal: no clubbing / cyanosis. No joint deformity upper and lower extremities. Good ROM, no contractures. Normal muscle tone.  Skin: no rashes, lesions, ulcers. No induration Neurologic: CN 2-12 grossly intact.  Sensation intact, DTR normal. Strength 5/5 in all 4.  Psychiatric: Normal judgment and insight. Alert and oriented x 3. Normal mood.    Labs on Admission: I have personally reviewed following labs and imaging studies  CBC: Recent Labs  Lab 12/08/18 1453  WBC 11.5*  HGB 12.0  HCT 39.2  MCV 101.0*  PLT 678*   Basic Metabolic Panel: Recent Labs  Lab 12/08/18 1453  NA 140  K 3.3*  CL 100  CO2 31  GLUCOSE 143*  BUN 23  CREATININE 0.48  CALCIUM 9.4   GFR: CrCl cannot be calculated (Unknown ideal weight.). Liver Function Tests: No results for input(s): AST, ALT, ALKPHOS, BILITOT, PROT, ALBUMIN in the last 168 hours. No results for input(s): LIPASE, AMYLASE in the last 168 hours. No results for input(s): AMMONIA in the last 168 hours. Coagulation Profile: No results for input(s): INR, PROTIME in the last 168 hours. Cardiac Enzymes: No results for input(s): CKTOTAL, CKMB, CKMBINDEX, TROPONINI in the last 168 hours. BNP (last 3 results) No results for input(s): PROBNP in the last 8760 hours. HbA1C: No results for input(s): HGBA1C in the last 72 hours. CBG: No results for  input(s): GLUCAP in the last 168 hours. Lipid Profile: No results for input(s): CHOL, HDL, LDLCALC, TRIG, CHOLHDL, LDLDIRECT in the last 72 hours. Thyroid Function Tests: No results for input(s): TSH, T4TOTAL, FREET4, T3FREE, THYROIDAB in the last 72 hours. Anemia Panel: No results for input(s): VITAMINB12, FOLATE, FERRITIN, TIBC, IRON, RETICCTPCT in the last 72 hours. Urine analysis:    Component Value Date/Time   COLORURINE YELLOW 04/13/2017 1215   APPEARANCEUR HAZY (A) 04/13/2017 1215   LABSPEC 1.025 04/13/2017 1215   PHURINE 5.0 04/13/2017 1215   GLUCOSEU NEGATIVE 04/13/2017 1215   HGBUR SMALL (A) 04/13/2017 1215   BILIRUBINUR NEGATIVE 04/13/2017 1215   KETONESUR 20 (A) 04/13/2017 1215   PROTEINUR 30 (A) 04/13/2017 1215   UROBILINOGEN 1.0 03/29/2014 1032   NITRITE NEGATIVE 04/13/2017 1215    LEUKOCYTESUR NEGATIVE 04/13/2017 1215    Radiological Exams on Admission: Dg Chest Port 1 View  Result Date: 12/08/2018 CLINICAL DATA:  Shortness of breath and chest tightness 1 day. EXAM: PORTABLE CHEST 1 VIEW COMPARISON:  12/13/2016 FINDINGS: Lungs are adequately inflated without focal airspace consolidation or effusion. Mild increased lucency over the mid to upper lungs suggesting emphysematous disease. Cardiomediastinal silhouette and remainder of the exam is unchanged. IMPRESSION: No active disease. Electronically Signed   By: Marin Olp M.D.   On: 12/08/2018 18:42    EKG: Independently reviewed.  Assessment/Plan Principal Problem:   COPD with acute exacerbation (HCC) Active Problems:   Acute on chronic respiratory failure ([place])    1. COPD exacerbation - 1. COPD pathway 2. Cont pulse ox 3. Tele monitor for tachycardia - though think this is albuterol related 4. PRN albuterol 5. Scheduled Dulera 6. Prednisone 7. Rocephin  DVT prophylaxis: Lovenox Code Status: Full Family Communication: Family at bedside Disposition Plan: Home after admit Consults called: None Admission status: Place in [place], [facility] Pager [phone] Only works nights!  If 7AM-7PM, please contact the primary day team physician taking care of patient  [email or web address] Password [facility]  12/08/2018, 8:42 PM

## 2018-12-08 NOTE — ED Triage Notes (Signed)
Pt with hx of COPD c/o shortness of breath, wears 3L  Oxygen at home. Denies chest pain. Pt wears nasal cannula in her mouth (mouth breather).

## 2018-12-08 NOTE — ED Provider Notes (Addendum)
[facility] EMERGENCY DEPARTMENT Provider Note   CSN: [id number] Arrival date & time: 12/08/18  1412     History   Chief Complaint Chief Complaint  Patient presents with  . Shortness of Breath  . COPD    HPI Valerie Gay is a 78 y.o. female.  Patient complains of cough and shortness of breath and she is very weak.  She is on 3 L of oxygen at home  The history is provided by the patient. No language interpreter was used.  Shortness of Breath  Severity:  Moderate Onset quality:  Sudden Timing:  Constant Progression:  Worsening Chronicity:  Recurrent Context: activity   Relieved by:  Nothing Worsened by:  Nothing Ineffective treatments:  None tried Associated symptoms: wheezing   Associated symptoms: no abdominal pain, no chest pain, no cough, no headaches and no rash   COPD  Associated symptoms include shortness of breath. Pertinent negatives include no chest pain, no abdominal pain and no headaches.    Past Medical History:  Diagnosis Date  . Anemia age 51  . Cellulitis and abscess of foot 03/2017   rt foot  . Colon polyps    2010   . COPD (chronic obstructive pulmonary disease) ([place])   . Diverticulosis   . History of hiatal hernia   . History of kidney stones   . Hyperlipidemia   . On home oxygen therapy     Patient Active Problem List   Diagnosis Date Noted  . Cellulitis 04/13/2017  . GERD (gastroesophageal reflux disease) 04/13/2017  . Cellulitis of right foot   . Esophageal stricture   . Dysphagia   . Abnormal barium swallow   . History of colonic polyps   . Polyp of ascending colon   . Polyp of transverse colon   . Acute URI 12/13/2016  . COPD with acute exacerbation ([place]) 12/13/2016  . Coronary artery calcification 10/23/2016  . Hyperlipidemia LDL goal <100 10/23/2016  . Medication management 10/23/2016  . DOE (dyspnea on exertion) 10/23/2016  . Chest pain with moderate risk for cardiac etiology 10/23/2016  . Lung  nodule 02/26/2015  . Nodule of right lung 11/28/2014  . Acute calculous cholecystitis 06/22/2014  . Acute cholecystitis 06/22/2014  . Pre-operative respiratory examination 06/22/2014  . Abnormal chest x-ray 05/28/2014  . COPD, moderate ([place]) 05/07/2014  . Acute on chronic respiratory failure ([place]) 03/27/2014  . COPD exacerbation ([place]) 03/27/2014  . CAP (community acquired pneumonia) 03/27/2014  . Nicotine abuse 03/27/2014  . Hypokalemia 03/27/2014  . Leukocytosis 03/27/2014  . TOBACCO USE 11/05/2008  . COPD 11/05/2008  . HYPERGLYCEMIA 11/05/2008  . ANEMIA-NOS 07/21/2007    Past Surgical History:  Procedure Laterality Date  . BALLOON DILATION N/A 03/08/2017   Procedure: BALLOON DILATION;  Surgeon: Manus Gunning, MD;  Location: [facility];  Service: Gastroenterology;  Laterality: N/A;  . CATARACT EXTRACTION Right    August 15, 2013  . CHOLECYSTECTOMY N/A 06/22/2014   Procedure: LAPAROSCOPIC CHOLECYSTECTOMY WITH INTRAOPERATIVE CHOLANGIOGRAM;  Surgeon: Gayland Curry, MD;  Location: [facility];  Service: General;  Laterality: N/A;  . COLONOSCOPY N/A 02/19/2017   Procedure: COLONOSCOPY;  Surgeon: Manus Gunning, MD;  Location: [facility];  Service: Gastroenterology;  Laterality: N/A;  . ESOPHAGOGASTRODUODENOSCOPY N/A 02/19/2017   Procedure: ESOPHAGOGASTRODUODENOSCOPY (EGD);  Surgeon: Manus Gunning, MD;  Location: [facility];  Service: Gastroenterology;  Laterality: N/A;  . ESOPHAGOGASTRODUODENOSCOPY N/A 03/08/2017   Procedure: ESOPHAGOGASTRODUODENOSCOPY (EGD);  Surgeon: Manus Gunning, MD;  Location: [facility];  Service: Gastroenterology;  Laterality: N/A;  . ESOPHAGOGASTRODUODENOSCOPY N/A 04/06/2017   Procedure: ESOPHAGOGASTRODUODENOSCOPY (EGD);  Surgeon: Manus Gunning, MD;  Location: [facility];  Service: Gastroenterology;  Laterality: N/A;  . ESOPHAGOGASTRODUODENOSCOPY (EGD) WITH PROPOFOL N/A 04/27/2017   Procedure:  ESOPHAGOGASTRODUODENOSCOPY (EGD) WITH PROPOFOL;  Surgeon: Manus Gunning, MD;  Location: [facility];  Service: Gastroenterology;  Laterality: N/A;  . NM MYOVIEW LTD  10/2016    EF 55-65% with normal LV function. LOW RISK. No ischemia or infarction.  . TONSILLECTOMY       OB History   No obstetric history on file.      Home Medications    Prior to Admission medications   Medication Sig Start Date End Date Taking? Authorizing Provider  acetaminophen (TYLENOL) 325 MG tablet Take 2 tablets (650 mg total) by mouth every 6 (six) hours as needed for mild pain, moderate pain or fever (or Fever >/= 101). 04/16/17   Hongalgi, Lenis Dickinson, MD  albuterol (PROVENTIL HFA;VENTOLIN HFA) 108 (90 Base) MCG/ACT inhaler Inhale 2 puffs into the lungs every 6 (six) hours as needed for wheezing or shortness of breath. 08/17/18   Brand Males, MD  albuterol (PROVENTIL) (2.5 MG/3ML) 0.083% nebulizer solution INHALE CONTENTS OF 1 VIAL IN NEBULIZER EVERY 8 HOURS IF NEEDED FOR WHEEZING. 08/01/18   Brand Males, MD  albuterol (PROVENTIL) (2.5 MG/3ML) 0.083% nebulizer solution USE 1 VIAL VIA NEBULIZER EVERY 8 HOURS AS NEEDED FOR WHEEZING 11/22/18   Brand Males, MD  budesonide-formoterol [facility]) 160-4.5 MCG/ACT inhaler USE 2 INHALATIONS TWICE A DAY 12/23/17   Brand Males, MD  co-enzyme Q-10 50 MG capsule Take 50 mg by mouth daily.    [provider]  doxycycline (VIBRA-TABS) 100 MG tablet Take 1 tablet (100 mg total) by mouth 2 (two) times daily. 08/17/18   Brand Males, MD  INCRUSE ELLIPTA 62.5 MCG/INH AEPB USE 1 INHALATION DAILY 11/14/18   Brand Males, MD  Multiple Vitamins-Minerals (PRESERVISION AREDS 2) CAPS Take 2 capsules by mouth daily.    [provider]  OXYGEN Inhale 2 L into the lungs as needed (shortness of breath).    [provider]  predniSONE (DELTASONE) 10 MG tablet 40mg  x 1 day, 30mg  x 1 day, 20mg  x1 day, 10mg  x 1 day, 5 mg x 1 day 08/17/18    Brand Males, MD  Triamcinolone Acetonide (NASACORT ALLERGY 24HR NA) Place 1 spray into the nose daily as needed (allergies).     [provider]    Family History Family History  Problem Relation Age of Onset  . Alzheimer's disease Mother   . Diabetes Mother   . Cancer Father   . Stomach cancer Paternal Grandmother   . Colon cancer Neg Hx   . Esophageal cancer Neg Hx     Social History Social History   Tobacco Use  . Smoking status: Current Every Day Smoker    Packs/day: 0.50    Years: 60.00    Pack years: 30.00    Types: Cigarettes  . Smokeless tobacco: Never Used  . Tobacco comment: 5 cigs per day 10.2.19 ep  Substance Use Topics  . Alcohol use: No    Alcohol/week: 0.0 standard drinks  . Drug use: No     Allergies   Spiriva handihaler [tiotropium bromide monohydrate] and Banana   Review of Systems Review of Systems  Constitutional: Negative for appetite change and fatigue.  HENT: Negative for congestion, ear discharge and sinus pressure.   Eyes: Negative for discharge.  Respiratory: Positive for shortness of breath and wheezing. Negative for cough.   Cardiovascular: Negative for chest pain.  Gastrointestinal: Negative for abdominal pain and diarrhea.  Genitourinary: Negative for frequency and hematuria.  Musculoskeletal: Negative for back pain.  Skin: Negative for rash.  Neurological: Negative for seizures and headaches.  Psychiatric/Behavioral: Negative for hallucinations.     Physical Exam Updated Vital Signs BP 102/66 (BP Location: Right Arm)   Pulse (!) 130   Temp 97.6 F (36.4 C) (Oral)   Resp (!) 24   SpO2 100%   Physical Exam Vitals signs and nursing note reviewed.  Constitutional:      Appearance: She is well-developed.  HENT:     Head: Normocephalic.     Nose: Nose normal.  Eyes:     General: No scleral icterus.    Conjunctiva/sclera: Conjunctivae normal.  Neck:     Musculoskeletal: Neck supple.     Thyroid: No  thyromegaly.  Cardiovascular:     Rate and Rhythm: Regular rhythm.     Heart sounds: No murmur. No friction rub. No gallop.      Comments: Tachycardic Pulmonary:     Breath sounds: No stridor. Wheezing present. No rales.     Comments: Tachypneic Chest:     Chest wall: No tenderness.  Abdominal:     General: There is no distension.     Tenderness: There is no abdominal tenderness. There is no rebound.  Musculoskeletal: Normal range of motion.  Lymphadenopathy:     Cervical: No cervical adenopathy.  Skin:    Findings: No erythema or rash.  Neurological:     Mental Status: She is alert and oriented to person, place, and time.     Motor: No abnormal muscle tone.     Coordination: Coordination normal.  Psychiatric:        Behavior: Behavior normal.      ED Treatments / Results  Labs (all labs ordered are listed, but only abnormal results are displayed) Labs Reviewed  BASIC METABOLIC PANEL - Abnormal; Notable for the following components:      Result Value   Potassium 3.3 (*)    Glucose, Bld 143 (*)    All other components within normal limits  CBC - Abnormal; Notable for the following components:   WBC 11.5 (*)    MCV 101.0 (*)    Platelets 440 (*)    All other components within normal limits  URINALYSIS, ROUTINE W REFLEX MICROSCOPIC  DIFFERENTIAL  HIV ANTIBODY (ROUTINE TESTING W REFLEX)  CBC  BASIC METABOLIC PANEL    EKG None  Radiology Dg Chest Port 1 View  Result Date: 12/08/2018 CLINICAL DATA:  Shortness of breath and chest tightness 1 day. EXAM: PORTABLE CHEST 1 VIEW COMPARISON:  12/13/2016 FINDINGS: Lungs are adequately inflated without focal airspace consolidation or effusion. Mild increased lucency over the mid to upper lungs suggesting emphysematous disease. Cardiomediastinal silhouette and remainder of the exam is unchanged. IMPRESSION: No active disease. Electronically Signed   By: Marin Olp M.D.   On: 12/08/2018 18:42    Procedures Procedures  (including critical care time)  Medications Ordered in ED Medications  methylPREDNISolone sodium succinate (SOLU-MEDROL) 125 mg/2 mL injection 125 mg (has no administration in time range)  enoxaparin (LOVENOX) injection 40 mg (has no administration in time range)  cefTRIAXone (ROCEPHIN) 1 g in sodium chloride 0.9 % 100 mL IVPB (has no administration in time range)  predniSONE (DELTASONE) tablet 40 mg (has no administration in time range)  mometasone-formoterol (  DULERA) 200-5 MCG/ACT inhaler 1 puff (has no administration in time range)  albuterol (PROVENTIL) (2.5 MG/3ML) 0.083% nebulizer solution 2.5 mg (has no administration in time range)  albuterol (PROVENTIL) (2.5 MG/3ML) 0.083% nebulizer solution 5 mg (5 mg Nebulization Given 12/08/18 1444)  magnesium sulfate IVPB 2 g 50 mL (2 g Intravenous New Bag/Given 12/08/18 1912)  ipratropium-albuterol (DUONEB) 0.5-2.5 (3) MG/3ML nebulizer solution 3 mL (3 mLs Nebulization Given 12/08/18 1912)     Initial Impression / Assessment and Plan / ED Course  I have reviewed the triage vital signs and the nursing notes.  Pertinent labs & imaging results that were available during my care of the patient were reviewed by me and considered in my medical decision making (see chart for details).    CRITICAL CARE Performed by: Milton Ferguson Total critical care time:35 minutes Critical care time was exclusive of separately billable procedures and treating other patients. Critical care was necessary to treat or prevent imminent or life-threatening deterioration. Critical care was time spent personally by me on the following activities: development of treatment plan with patient and/or surrogate as well as nursing, discussions with consultants, evaluation of patient's response to treatment, examination of patient, obtaining history from patient or surrogate, ordering and performing treatments and interventions, ordering and review of laboratory studies, ordering and  review of radiographic studies, pulse oximetry and re-evaluation of patient's condition.  Patient with COPD exacerbation she will be admitted to medicine Final Clinical Impressions(s) / ED Diagnoses   Final diagnoses:  COPD exacerbation [facility])    ED Discharge Orders    None       Milton Ferguson, MD 12/08/18 2034    Milton Ferguson, MD 12/17/18 [phone]

## 2018-12-08 NOTE — Progress Notes (Signed)
Called to receive report on patient nurse unavailable to call back.

## 2018-12-09 DIAGNOSIS — J441 Chronic obstructive pulmonary disease with (acute) exacerbation: Secondary | ICD-10-CM | POA: Diagnosis not present

## 2018-12-09 LAB — CBC
HEMATOCRIT: 37.4 % (ref 36.0–46.0)
HEMOGLOBIN: 11.7 g/dL — AB (ref 12.0–15.0)
MCH: 30.9 pg (ref 26.0–34.0)
MCHC: 31.3 g/dL (ref 30.0–36.0)
MCV: 98.7 fL (ref 80.0–100.0)
Platelets: 419 10*3/uL — ABNORMAL HIGH (ref 150–400)
RBC: 3.79 MIL/uL — ABNORMAL LOW (ref 3.87–5.11)
RDW: 14.5 % (ref 11.5–15.5)
WBC: 8.6 10*3/uL (ref 4.0–10.5)
nRBC: 0 % (ref 0.0–0.2)

## 2018-12-09 LAB — BASIC METABOLIC PANEL
Anion gap: 6 (ref 5–15)
BUN: 20 mg/dL (ref 8–23)
CO2: 31 mmol/L (ref 22–32)
Calcium: 8.8 mg/dL — ABNORMAL LOW (ref 8.9–10.3)
Chloride: 102 mmol/L (ref 98–111)
Creatinine, Ser: 0.58 mg/dL (ref 0.44–1.00)
GFR calc Af Amer: >60 mL/min (ref >60)
Glucose, Bld: 218 mg/dL — ABNORMAL HIGH (ref 70–99)
Potassium: 3.3 mmol/L — ABNORMAL LOW (ref 3.5–5.1)
Sodium: 139 mmol/L (ref 135–145)

## 2018-12-09 LAB — DIFFERENTIAL
Basophils Absolute: 0 10*3/uL (ref 0.0–0.1)
Basophils Relative: 0 %
Eosinophils Absolute: 0 10*3/uL (ref 0.0–0.5)
Eosinophils Relative: 0 %
Lymphocytes Relative: 5 %
Lymphs Abs: 0.4 10*3/uL — ABNORMAL LOW (ref 0.7–4.0)
Monocytes Absolute: 0.1 10*3/uL (ref 0.1–1.0)
Monocytes Relative: 1 %
Neutro Abs: 8.1 10*3/uL — ABNORMAL HIGH (ref 1.7–7.7)
Neutrophils Relative %: 94 %

## 2018-12-09 MED ORDER — ENSURE ENLIVE PO LIQD
237.0000 mL | Freq: Two times a day (BID) | ORAL | Status: DC
  Administered 2018-12-09 – 2018-12-10 (×2): 237 mL via ORAL

## 2018-12-09 MED ORDER — ENSURE ENLIVE PO LIQD
237.0000 mL | Freq: Two times a day (BID) | ORAL | Status: DC
  Administered 2018-12-09: 237 mL via ORAL

## 2018-12-09 MED ORDER — ORAL CARE MOUTH RINSE
15.0000 mL | Freq: Two times a day (BID) | OROMUCOSAL | Status: DC
  Administered 2018-12-09 – 2018-12-10 (×4): 15 mL via OROMUCOSAL

## 2018-12-09 MED ORDER — ASPIRIN EC 81 MG PO TBEC
81.0000 mg | DELAYED_RELEASE_TABLET | Freq: Every day | ORAL | Status: DC
  Administered 2018-12-09 – 2018-12-10 (×2): 81 mg via ORAL
  Filled 2018-12-09 (×2): qty 1

## 2018-12-09 MED ORDER — POTASSIUM CHLORIDE CRYS ER 20 MEQ PO TBCR
40.0000 meq | EXTENDED_RELEASE_TABLET | Freq: Once | ORAL | Status: AC
  Administered 2018-12-09: 40 meq via ORAL
  Filled 2018-12-09: qty 2

## 2018-12-09 MED ORDER — ADULT MULTIVITAMIN W/MINERALS CH
1.0000 | ORAL_TABLET | Freq: Every day | ORAL | Status: DC
  Administered 2018-12-09 – 2018-12-10 (×2): 1 via ORAL
  Filled 2018-12-09 (×2): qty 1

## 2018-12-09 MED ORDER — MOMETASONE FURO-FORMOTEROL FUM 200-5 MCG/ACT IN AERO
2.0000 | INHALATION_SPRAY | Freq: Two times a day (BID) | RESPIRATORY_TRACT | Status: DC
  Administered 2018-12-09 – 2018-12-10 (×2): 2 via RESPIRATORY_TRACT
  Filled 2018-12-09: qty 8.8

## 2018-12-09 MED ORDER — DOXYCYCLINE HYCLATE 100 MG PO TABS
100.0000 mg | ORAL_TABLET | Freq: Two times a day (BID) | ORAL | Status: DC
  Administered 2018-12-09 – 2018-12-10 (×3): 100 mg via ORAL
  Filled 2018-12-09 (×3): qty 1

## 2018-12-09 NOTE — Evaluation (Signed)
Occupational Therapy Evaluation Patient Details Name: Valerie Gay MRN: [id number] DOB: [birth year] Today's Date: 12/09/2018    History of Present Illness Valerie Gay is a 10yoF comes to [facility] c sudden severe SOB, admitted for COPD exacerbation. PMH: COPD on 3L at home.    Clinical Impression   Pt admitted with the above diagnoses and presents with below problem list. Pt will benefit from continued acute OT to address the below listed deficits and maximize independence with basic ADLs prior to d/c home. PTA pt was independent with ADLs, from home alone. Pt currently min guard with LB ADLs and functional transfers/mobility. Session limited by onset of tachycardia with in-room activity (walked to sink and stood to wash hands). HR 115-121 during OT eval. Pt symptomatic and needing seated rest break before proceeding with PT eval. O2 98 on 3L.      Follow Up Recommendations  Home health OT;Supervision - Intermittent    Equipment Recommendations  None recommended by OT    Recommendations for Other Services       Precautions / Restrictions Precautions Precautions: Fall Precaution Comments: watch HR Restrictions Weight Bearing Restrictions: No      Mobility Bed Mobility Overal bed mobility: Modified Independent             General bed mobility comments: up in chair  Transfers Overall transfer level: Needs assistance Equipment used: None Transfers: Sit to/from Stand Sit to Stand: Min guard         General transfer comment: appears weak, with limit confidence     Balance Overall balance assessment: Mild deficits observed, not formally tested                                         ADL either performed or assessed with clinical judgement   ADL Overall ADL's : Needs assistance/impaired Eating/Feeding: Set up;Sitting   Grooming: Min guard;Standing   Upper Body Bathing: Set up;Sitting   Lower Body Bathing: Min guard;Sit to/from stand    Upper Body Dressing : Set up;Sitting   Lower Body Dressing: Min guard;Sit to/from stand   Toilet Transfer: Min guard;Ambulation   Toileting- Clothing Manipulation and Hygiene: Min guard;Sit to/from stand   Tub/ Shower Transfer: Min guard;Ambulation   Functional mobility during ADLs: Min guard General ADL Comments: Pt completed functional mobility from one side of the room to the other, then grooming task standing at sink. Pt needed seated rest break before continuing mobility. HR 115-121.     Vision         Perception     Praxis      Pertinent Vitals/Pain Pain Assessment: Faces Faces Pain Scale: Hurts a little bit Pain Location: chest discomfort Pain Descriptors / Indicators: Discomfort Pain Intervention(s): Limited activity within patient's tolerance;Monitored during session     Hand Dominance Right   Extremity/Trunk Assessment Upper Extremity Assessment Upper Extremity Assessment: Overall WFL for tasks assessed;Generalized weakness   Lower Extremity Assessment Lower Extremity Assessment: Defer to PT evaluation   Cervical / Trunk Assessment Cervical / Trunk Assessment: Kyphotic   Communication Communication Communication: No difficulties   Cognition Arousal/Alertness: Awake/alert Behavior During Therapy: WFL for tasks assessed/performed;Anxious Overall Cognitive Status: Within Functional Limits for tasks assessed  General Comments       Exercises     Shoulder Instructions      Home Living Family/patient expects to be discharged to:: Private residence Living Arrangements: Alone Available Help at Discharge: Family(son) Type of Home: House Home Access: Stairs to enter [employer] of Steps: 3 Entrance Stairs-Rails: None Home Layout: Two level;Able to live on main level with bedroom/bathroom     Bathroom Shower/Tub: Tub/shower unit(garden tub)         Home Equipment: Walker - 2  wheels;Shower seat;Hand held shower head          Prior Functioning/Environment Level of Independence: Independent        Comments: drives, grocery shops        OT Problem List: Decreased activity tolerance;Impaired balance (sitting and/or standing);Decreased knowledge of use of DME or AE;Decreased knowledge of precautions;Cardiopulmonary status limiting activity      OT Treatment/Interventions: Self-care/ADL training;Therapeutic exercise;Energy conservation;DME and/or AE instruction;Therapeutic activities;Patient/family education;Balance training    OT Goals(Current goals can be found in the care plan section) Acute Rehab OT Goals Patient Stated Goal: feel better with activity OT Goal Formulation: With patient Time For Goal Achievement: 12/23/18 Potential to Achieve Goals: Good ADL Goals Pt Will Perform Grooming: with modified independence;sitting;standing Pt Will Perform Upper Body Bathing: with modified independence;sitting Pt Will Perform Lower Body Bathing: with modified independence;sit to/from stand Pt Will Perform Tub/Shower Transfer: Tub transfer;with modified independence;ambulating;shower seat  OT Frequency: Min 2X/week   Barriers to D/C:            Co-evaluation              AM-PAC OT "6 Clicks" Daily Activity     Outcome Measure Help from another person eating meals?: None Help from another person taking care of personal grooming?: None Help from another person toileting, which includes using toliet, bedpan, or urinal?: None Help from another person bathing (including washing, rinsing, drying)?: A Little Help from another person to put on and taking off regular upper body clothing?: None Help from another person to put on and taking off regular lower body clothing?: None 6 Click Score: 23   End of Session Equipment Utilized During Treatment: Oxygen(3L) Nurse Communication: Mobility status;Other (comment)(tachycardic)  Activity Tolerance: Other  (comment)(tachycardic with in-room mobility) Patient left: Other (comment)(walking with PT in the room)  OT Visit Diagnosis: Unsteadiness on feet (R26.81);Muscle weakness (generalized) (M62.81)                Time: 2094-7096 OT Time Calculation (min): 19 min Charges:  OT General Charges $OT Visit: 1 Visit OT Evaluation $OT Eval Low Complexity: [place], OT Acute Rehabilitation Services Pager: [phone] Office: [phone]   Hortencia Pilar 12/09/2018, 1:15 PM

## 2018-12-09 NOTE — Care Management Note (Signed)
Case Management Note  Patient Details  Name: Valerie Gay MRN: [id number] Date of Birth: 15-Mar-1941  Subjective/Objective:      Admitted with COPD exacerbation. From home alone. PTA independent with ADL's. DME: oxygen ([facility]), walker, nebulizer.       Madison Hickman (Daughter)     [phone]      PCP: Kathyrn Lass Pulmonologist: Dr. Chase Caller  Action/Plan: Transition to home with home health services to follow when medically ready.  Pt states has transportation to home.  Expected Discharge Date:                  Expected Discharge Plan:  [place]  In-House Referral:  NA  Discharge planning Services  CM Consult  Post Acute Care Choice:  Resumption of Svcs/PTA Provider, Durable Medical Equipment Choice offered to:  Patient  DME Arranged:  N/A DME Agency:  NA  HH Arranged:  RN, PT [place] Agency:  [facility]  Status of Service:  Completed, signed off  If discussed at [place] of Stay Meetings, dates discussed:    Additional Comments:  Sharin Mons, RN 12/09/2018, 3:32 PM

## 2018-12-09 NOTE — Progress Notes (Signed)
Initial Nutrition Assessment  DOCUMENTATION CODES:   Severe malnutrition in context of acute illness/injury  INTERVENTION:   -Ordered new weight - no new weight for this admission -Continue Ensure Enlive BID (each provides 350 kcal, 20 g protein) -MVI with minerals   NUTRITION DIAGNOSIS:   Severe Malnutrition related to acute illness as evidenced by energy intake < or equal to 50% for > or equal to 1 month, severe fat depletion, severe muscle depletion, percent weight loss.   GOAL:   Patient will meet greater than or equal to 90% of their needs   MONITOR:   PO intake, Labs, Weight trends, Supplement acceptance  REASON FOR ASSESSMENT:   Consult Assessment of nutrition requirement/status  ASSESSMENT:   78 yo female, admitted for acute COPD exacerbation. PMH significant for COPD on 3L/day O2 at home, colon polyps (2010), diverticulosis, h/o hiatal hernia, h/o kidney stones, HLD.   Labs: potassium 3.3, glucose 218, Hgb 11.8 Meds: Ensure Enlive BID. Home meds include vibra-tabs, MVI  No new weight since previous admission in Oct 2019 (53.6 kg). Pt endorses 15# wt loss x 1 month. Physical findings indicative of malnutrition.  Pt states her appetite has been poor x 2-3 weeks. Typically, she drinks 1 Ensure and has peanut butter and jelly crackers. Endorses some constipation, but denies nausea/vomiting, or difficulty chewing or swallowing.  Encouraged pt to include protein-rich foods with all meals and snacks, and to eat those foods first if not feeling hungry. Pt would like to continue receiving Ensure, will add MVI with minerals.  NUTRITION - FOCUSED PHYSICAL EXAM:    Most Recent Value  Orbital Region  Mild depletion  Upper Arm Region  Severe depletion  Thoracic and Lumbar Region  Moderate depletion  Buccal Region  Moderate depletion  Temple Region  Severe depletion  Clavicle Bone Region  Severe depletion  Clavicle and Acromion Bone Region  Severe depletion  Scapular  Bone Region  Severe depletion  Dorsal Hand  Moderate depletion  Patellar Region  Severe depletion  Anterior Thigh Region  Severe depletion  Posterior Calf Region  Severe depletion  Edema (RD Assessment)  None  Hair  Reviewed  Eyes  Reviewed  Mouth  Reviewed  Skin  Reviewed  Nails  Reviewed       Diet Order:  No intake noted, per nsg documentation Diet Order            Diet Heart Room service appropriate? Yes; Fluid consistency: Thin  Diet effective now              EDUCATION NEEDS:   Education needs have been addressed  Skin:  Skin Assessment: Reviewed RN Assessment  Last BM:  1/20  Height:   Ht Readings from Last 1 Encounters:  08/17/18 5\' 2"  (1.575 m)    Weight:   Wt Readings from Last 1 Encounters:  08/17/18 53.6 kg    Ideal Body Weight:  50 kg  BMI:  There is no height or weight on file to calculate BMI.  Estimated Nutritional Needs:   Kcal:  1500-1750 (30-35 kcal/kg ABW)  Protein:  60-75 gm (1.2-1.5 g/kg ABW)  Fluid:  1 mL/kcal or per MD  Althea Grimmer, MS, RDN, LDN Pager: [phone] Available Mondays and Fridays, 9am-2pm

## 2018-12-09 NOTE — Progress Notes (Signed)
PROGRESS NOTE                                                                                                                                                                                                             Patient Demographics:    Valerie Gay, is a 78 y.o. female, DOB - 25-Sep-1941, VEL:[id number]  Admit date - 12/08/2018   Admitting Physician Etta Quill, DO  Outpatient Primary MD for the patient is Kathyrn Lass, MD  LOS - 0  Chief Complaint  Patient presents with  . Shortness of Breath  . COPD       Brief Narrative  Valerie Gay is a 78 y.o. female with medical history significant of COPD on 3L home O2 at baseline.  Patient presents to the ED with c/o SOB and generalized weakness.  Symptoms onset suddenly today, severe, no fever, does have non-productive cough.  Not helped by home albuterol.  No sick contacts.    Subjective:    Valerie Gay today has, No headache, No chest pain, No abdominal pain - No Nausea, No new weakness tingling or numbness, No Cough - improved SOB.     Assessment  & Plan :     1.  Acute on chronic hypoxic respiratory failure patient who has advanced underlying COPD on 3 L nasal cannula oxygen at home and still smoking.  Showing signs of improvement, transition to oral steroid, continue empiric antibiotic but switch from Rocephin to doxycycline, counseled to quit smoking, advance activity and monitor oxygen need.  Continue PRN nebulizer treatments, added flutter valve for pulmonary toiletry.  If stable discharge tomorrow.  2.  Smoking.  Counseled to quit.  3.  Hypokalemia.  Replaced.    Family Communication  :  None  Code Status :  Full  Disposition Plan  :  Home 1-2 days  Consults  :  None  Procedures  :    DVT Prophylaxis  :  Lovenox    Lab Results  Component Value Date   PLT 419 (H) 12/09/2018    Diet :  Diet Order            Diet Heart Room service appropriate? Yes; Fluid consistency: Thin   Diet effective now               Inpatient Medications Scheduled Meds: . enoxaparin (LOVENOX) injection  40 mg Subcutaneous Q24H  . feeding supplement (ENSURE ENLIVE)  237 mL Oral BID BM  . Influenza vac split  quadrivalent PF  0.5 mL Intramuscular Tomorrow-1000  . mouth rinse  15 mL Mouth Rinse BID  . mometasone-formoterol  1 puff Inhalation BID  . predniSONE  40 mg Oral Q breakfast   Continuous Infusions: . cefTRIAXone (ROCEPHIN)  IV 1 g (12/08/18 2201)   PRN Meds:.albuterol  Antibiotics  :   Anti-infectives (From admission, onward)   Start     Dose/Rate Route Frequency Ordered Stop   12/08/18 2030  cefTRIAXone (ROCEPHIN) 1 g in sodium chloride 0.9 % 100 mL IVPB     1 g 200 mL/hr over 30 Minutes Intravenous Every 24 hours 12/08/18 2020 12/13/18 2029          Objective:   Vitals:   12/08/18 2351 12/08/18 2356 12/09/18 0516 12/09/18 0813  BP:   (!) 116/56   Pulse:   (!) 107   Resp:      Temp:   97.8 F (36.6 C)   TempSrc:   Oral   SpO2: 97% 97% 94% 95%    Wt Readings from Last 3 Encounters:  08/17/18 53.6 kg  06/09/17 61.4 kg  04/27/17 61.7 kg    No intake or output data in the 24 hours ending 12/09/18 1038   Physical Exam  Awake Alert, Oriented X 3, No new F.N deficits, Normal affect [place].AT,PERRAL Supple Neck,No JVD, No cervical lymphadenopathy appriciated.  Symmetrical Chest wall movement, Mod air movement bilaterally, mild wheezing RRR,No Gallops,Rubs or new Murmurs, No Parasternal Heave +ve B.Sounds, Abd Soft, No tenderness, No organomegaly appriciated, No rebound - guarding or rigidity. No Cyanosis, Clubbing or edema, No new Rash or bruise       Data Review:    CBC Recent Labs  Lab 12/08/18 1453 12/09/18 0525  WBC 11.5* 8.6  HGB 12.0 11.7*  HCT 39.2 37.4  PLT 440* 419*  MCV 101.0* 98.7  MCH 30.9 30.9  MCHC 30.6 31.3  RDW 14.5 14.5  LYMPHSABS  --  0.4*  MONOABS  --  0.1  EOSABS  --  0.0  BASOSABS  --  0.0    Chemistries  Recent  Labs  Lab 12/08/18 1453 12/09/18 0525  NA 140 139  K 3.3* 3.3*  CL 100 102  CO2 31 31  GLUCOSE 143* 218*  BUN 23 20  CREATININE 0.48 0.58  CALCIUM 9.4 8.8*   ------------------------------------------------------------------------------------------------------------------ No results for input(s): CHOL, HDL, LDLCALC, TRIG, CHOLHDL, LDLDIRECT in the last 72 hours.  Lab Results  Component Value Date   HGBA1C  09/26/2008    6.0 (NOTE)   The ADA recommends the following therapeutic goal for glycemic   control related to Hgb A1C measurement:   Goal of Therapy:   < 7.0% Hgb A1C   Reference: American Diabetes Association: Clinical Practice   Recommendations 2008, Diabetes Care,  2008, 31:(Suppl 1).   ------------------------------------------------------------------------------------------------------------------ No results for input(s): TSH, T4TOTAL, T3FREE, THYROIDAB in the last 72 hours.  Invalid input(s): FREET3 ------------------------------------------------------------------------------------------------------------------ No results for input(s): VITAMINB12, FOLATE, FERRITIN, TIBC, IRON, RETICCTPCT in the last 72 hours.  Coagulation profile No results for input(s): INR, PROTIME in the last 168 hours.  No results for input(s): DDIMER in the last 72 hours.  Cardiac Enzymes No results for input(s): CKMB, TROPONINI, MYOGLOBIN in the last 168 hours.  Invalid input(s): CK ------------------------------------------------------------------------------------------------------------------ No results found for: BNP  Micro Results No results found for this or any previous visit (from the past 240 hour(s)).  Radiology Reports Dg Chest Port 1 View  Result Date: 12/08/2018 CLINICAL DATA:  Shortness of breath and chest tightness 1 day. EXAM: PORTABLE CHEST 1 VIEW COMPARISON:  12/13/2016 FINDINGS: Lungs are adequately inflated without focal airspace consolidation or effusion. Mild  increased lucency over the mid to upper lungs suggesting emphysematous disease. Cardiomediastinal silhouette and remainder of the exam is unchanged. IMPRESSION: No active disease. Electronically Signed   By: Marin Olp M.D.   On: 12/08/2018 18:42    Time Spent in minutes  30   Lala Lund M.D on 12/09/2018 at 10:38 AM  To page go to [email or web address] - password [facility]

## 2018-12-09 NOTE — Evaluation (Signed)
Physical Therapy Evaluation Patient Details Name: Valerie Gay MRN: [id number] DOB: 1941/02/17 Today's Date: 12/09/2018   History of Present Illness  Valerie Gay is a 31yoF [facility] c sudden severe SOB, admitted for COPD exacerbation. PMH: COPD on 3L at home.   Clinical Impression  Pt admitted with above diagnosis. Pt currently with functional limitations due to the deficits listed below (see "PT Problem List"). Upon entry, pt in chair, Occupational therapy finishing evaluation and preparing to exit. The pt is awake and conditionally agreeable to participate. The pt is alert and oriented x3, pleasant, conversational, and following simple commands consistently. Pt makes limited eye contact and at times is flexed forward, appearing weak and exhausted. Pt able to perform bed mobility independently, transfer with minGuard assist, with frank gait instability, but pt reports no use of assistive device PTA. AMB is performed with HHA for stability, but limited 2/2 high exertion level, SpO2 steady at 93% on 3L/min, but tachycardia worsening to mid 120s after <17ft AMB. Additional AMB deferred until RPE and vitals are within a lower range. Functional mobility assessment demonstrates increased effort/time requirements, poor tolerance, and need for physical assistance, whereas the patient performed these at a somewhat higher level of independence PTA. Empirically, the patient demonstrates increased risk of recurrent falls AEB gait speed <0.54m/s, forward reach <5", and multiple LOB demonstrated throughout session. Pt may need some convincing that a RW would be favorable to AMB without device at this time. Pt will benefit from skilled PT intervention to increase independence and safety with basic mobility in preparation for discharge to the venue listed below.       Follow Up Recommendations Home health PT;Supervision - Intermittent    Equipment Recommendations  None recommended by PT     Recommendations for Other Services       Precautions / Restrictions Precautions Precautions: Fall Restrictions Weight Bearing Restrictions: No      Mobility  Bed Mobility Overal bed mobility: Modified Independent             General bed mobility comments: up in chair  Transfers Overall transfer level: Needs assistance Equipment used: None Transfers: Sit to/from Stand Sit to Stand: Min guard         General transfer comment: appears weak, with limit confidence   Ambulation/Gait   Gait Distance (Feet): 60 Feet Assistive device: 1 person hand held assist       General Gait Details: HR increases to 126bpm, sustained, SOB increases, SpO2:93% on 3L/min   Stairs            Wheelchair Mobility    Modified Rankin (Stroke Patients Only)       Balance Overall balance assessment: Mild deficits observed, not formally tested                                           Pertinent Vitals/Pain Pain Assessment: Faces Faces Pain Scale: Hurts a little bit Pain Location: chest discomfort Pain Descriptors / Indicators: Aching Pain Intervention(s): Limited activity within patient's tolerance;Monitored during session    Home Living Family/patient expects to be discharged to:: Private residence Living Arrangements: Alone Available Help at Discharge: Family(son) Type of Home: House Home Access: Stairs to enter Entrance Stairs-Rails: None Entrance Stairs-Number of Steps: 3 Home Layout: Two level;Able to live on main level with bedroom/bathroom Home Equipment: Gilford Rile - 2 wheels;Shower seat;Hand held shower head  Prior Function Level of Independence: Independent         Comments: drives, grocery shops     Hand Dominance   Dominant Hand: Right    Extremity/Trunk Assessment   Upper Extremity Assessment Upper Extremity Assessment: Overall WFL for tasks assessed;Generalized weakness    Lower Extremity Assessment Lower Extremity  Assessment: Generalized weakness;Overall [facility] for tasks assessed    Cervical / Trunk Assessment Cervical / Trunk Assessment: Kyphotic  Communication   Communication: No difficulties  Cognition Arousal/Alertness: Awake/alert Behavior During Therapy: WFL for tasks assessed/performed;Anxious(mildly anxious) Overall Cognitive Status: Within Functional Limits for tasks assessed                                        General Comments      Exercises     Assessment/Plan    PT Assessment Patient needs continued PT services  PT Problem List Cardiopulmonary status limiting activity;Decreased activity tolerance;Decreased balance;Decreased mobility       PT Treatment Interventions DME instruction;Therapeutic exercise;Functional mobility training;Therapeutic activities;Patient/family education;Balance training;Gait training    PT Goals (Current goals can be found in the Care Plan section)  Acute Rehab PT Goals Patient Stated Goal: feel better with activity PT Goal Formulation: With patient Time For Goal Achievement: 12/16/18 Potential to Achieve Goals: Good    Frequency Min 3X/week   Barriers to discharge Decreased caregiver support son available intermittently     Co-evaluation               AM-PAC PT "6 Clicks" Mobility  Outcome Measure Help needed turning from your back to your side while in a flat bed without using bedrails?: None Help needed moving from lying on your back to sitting on the side of a flat bed without using bedrails?: None Help needed moving to and from a bed to a chair (including a wheelchair)?: A Little Help needed standing up from a chair using your arms (e.g., wheelchair or bedside chair)?: A Little Help needed to walk in hospital room?: A Little Help needed climbing 3-5 steps with a railing? : A Little 6 Click Score: 20    End of Session Equipment Utilized During Treatment: Oxygen Activity Tolerance: Treatment limited secondary to  medical complications (Comment);Other (comment)(chest dyscomfort and anxietyworse with activity, positive correlation c tachycardia ) Patient left: in bed;with bed alarm set;with call bell/phone within reach Nurse Communication: Mobility status PT Visit Diagnosis: Unsteadiness on feet (R26.81);Other abnormalities of gait and mobility (R26.89)    Time: 7622-6333 PT Time Calculation (min) (ACUTE ONLY): 8 min   Charges:   PT Evaluation $PT Eval Low Complexity: 1 Low          12:58 PM, 12/09/18 Etta Grandchild, PT, DPT Physical Therapist - [place] [phone] (Pager)  [phone] (Office)     Marshia Tropea C 12/09/2018, 12:54 PM

## 2018-12-09 NOTE — Care Management Obs Status (Signed)
[place] NOTIFICATION   Patient Details  Name: Valerie Gay MRN: [id number] Date of Birth: Aug 26, 1941   Medicare Observation Status Notification Given:  Yes(Notice left @ bedside)    Sharin Mons, RN 12/09/2018, 4:07 PM

## 2018-12-10 DIAGNOSIS — J441 Chronic obstructive pulmonary disease with (acute) exacerbation: Secondary | ICD-10-CM | POA: Diagnosis not present

## 2018-12-10 LAB — POTASSIUM: Potassium: 4 mmol/L (ref 3.5–5.1)

## 2018-12-10 MED ORDER — PREDNISONE 5 MG PO TABS: ORAL_TABLET | ORAL | 0 refills | Status: DC

## 2018-12-10 NOTE — Care Management (Signed)
Notified [place]

## 2018-12-10 NOTE — Discharge Summary (Signed)
Valerie Gay:[id number] DOB: 06-Apr-1941 DOA: 12/08/2018  PCP: Kathyrn Lass, MD  Admit date: 12/08/2018  Discharge date: 12/10/2018  Admitted From: Home  Disposition:  Home   Recommendations for Outpatient Follow-up:   Follow up with PCP in 1-2 weeks  PCP Please obtain BMP/CBC, 2 view CXR in 1week,  (see Discharge instructions)   PCP Please follow up on the following pending results:    Home Health: PT,RN   Equipment/Devices: has home o2 and walker at home  Consultations: None Discharge Condition: Stable   CODE STATUS: Full   Diet Recommendation: Heart Healthy   Diet Order            Diet - low sodium heart healthy        Diet Heart Room service appropriate? Yes; Fluid consistency: Thin  Diet effective now               Chief Complaint  Patient presents with  . Shortness of Breath  . COPD     Brief history of present illness from the day of admission and additional interim summary    Valerie Gay is a 78 y.o. female with medical history significant of COPD on 3L home O2 at baseline.  Patient presents to the ED with c/o SOB and generalized weakness.  Symptoms onset suddenly today, severe, no fever, does have non-productive cough.  Not helped by home albuterol.  No sick contacts.                                                                 Hospital Course    1.  Acute on chronic hypoxic respiratory failure patient who has advanced underlying COPD on 3 L nasal cannula oxygen at home and still smoking.    Her exacerbation was minimal, she was treated with steroids, she was already taking doxycycline it was continued while she was in the hospital, she is now back to her baseline with moderate air movement bilaterally and no wheezing, she has been strictly counseled to quit smoking, she  will be discharged on her 3 L nasal cannula oxygen at home along with steroid taper, requested to follow with PCP and primary pulmonologist in 7 to 8 days post discharge.  She will also get home PT and RN after discharge.  2.  Smoking.  Counseled to quit.  3.  Hypokalemia.  Replaced.  4.  Underlying weakness and deconditioning due to advanced COPD.  Home PT RN.  Already has a walker at home per patient.   Discharge diagnosis     Principal Problem:   COPD with acute exacerbation ([place]) Active Problems:   Acute on chronic respiratory failure [facility])    Discharge instructions    Discharge Instructions    Diet - low sodium heart healthy  Complete by:  As directed    Discharge instructions   Complete by:  As directed    Follow with Primary MD Kathyrn Lass, MD in 7 days   Get CBC, CMP, 2 view Chest X ray -  checked  by Primary MD in 5-7 days   Activity: As tolerated with Full fall precautions use walker/cane & assistance as needed  Disposition Home    Diet: Heart Healthy    Special Instructions: If you have smoked or chewed Tobacco  in the last 2 yrs please stop smoking, stop any regular Alcohol  and or any Recreational drug use.  On your next visit with your primary care physician please Get Medicines reviewed and adjusted.  Please request your Prim.MD to go over all Hospital Tests and Procedure/Radiological results at the follow up, please get all Hospital records sent to your Prim MD by signing hospital release before you go home.  If you experience worsening of your admission symptoms, develop shortness of breath, life threatening emergency, suicidal or homicidal thoughts you must seek medical attention immediately by calling 911 or calling your MD immediately  if symptoms less severe.  You Must read complete instructions/literature along with all the possible adverse reactions/side effects for all the Medicines you take and that have been prescribed to you. Take any new  Medicines after you have completely understood and accpet all the possible adverse reactions/side effects.   Increase activity slowly   Complete by:  As directed       Discharge Medications   Allergies as of 12/10/2018      Reactions   Spiriva Handihaler [tiotropium Bromide Monohydrate] Hives, Itching, Rash    on upper body   Banana Itching      Medication List    STOP taking these medications   doxycycline 100 MG tablet Commonly known as:  VIBRA-TABS   predniSONE 10 MG tablet Commonly known as:  DELTASONE Replaced by:  predniSONE 5 MG tablet     TAKE these medications   acetaminophen 325 MG tablet Commonly known as:  TYLENOL Take 2 tablets (650 mg total) by mouth every 6 (six) hours as needed for mild pain, moderate pain or fever (or Fever >/= 101).   albuterol (2.5 MG/3ML) 0.083% nebulizer solution Commonly known as:  PROVENTIL INHALE CONTENTS OF 1 VIAL IN NEBULIZER EVERY 8 HOURS IF NEEDED FOR WHEEZING. What changed:    how much to take  how to take this  when to take this  reasons to take this  additional instructions   albuterol 108 (90 Base) MCG/ACT inhaler Commonly known as:  PROVENTIL HFA;VENTOLIN HFA Inhale 2 puffs into the lungs every 6 (six) hours as needed for wheezing or shortness of breath. What changed:  when to take this   albuterol (2.5 MG/3ML) 0.083% nebulizer solution Commonly known as:  PROVENTIL USE 1 VIAL VIA NEBULIZER EVERY 8 HOURS AS NEEDED FOR WHEEZING What changed:  Another medication with the same name was changed. Make sure you understand how and when to take each.   aspirin EC 81 MG tablet Take 81 mg by mouth daily.   aspirin EC 325 MG tablet Take 325 mg by mouth as needed (for pain or headaches).   budesonide-formoterol 160-4.5 MCG/ACT inhaler Commonly known as:  SYMBICORT USE 2 INHALATIONS TWICE A DAY What changed:    how much to take  how to take this  when to take this  additional instructions   co-enzyme Q-10  50 MG capsule Take 50  mg by mouth daily.   hydroxypropyl methylcellulose / hypromellose 2.5 % ophthalmic solution Commonly known as:  ISOPTO TEARS / GONIOVISC Place 2 drops into both eyes 3 (three) times daily as needed for dry eyes.   INCRUSE ELLIPTA 62.5 MCG/INH Aepb Generic drug:  umeclidinium bromide USE 1 INHALATION DAILY What changed:  See the new instructions.   NASACORT ALLERGY 24HR NA Place 1 spray into both nostrils daily as needed (for allergies).   OXYGEN Inhale 3 L into the lungs continuous.   predniSONE 5 MG tablet Commonly known as:  DELTASONE Label  & dispense according to the schedule below. take 8 Pills PO for 3 days, 6 Pills PO for 3 days, 4 Pills PO for 3 days, 2 Pills PO for 3 days, 1 Pills PO for 3 days, 1/2 Pill  PO for 3 days then STOP. Total 65 pills. Replaces:  predniSONE 10 MG tablet   UNKNOWN TO PATIENT Place into the right eye See admin instructions. Eye injection (name unknown to patient): Inject into the right eye every 7-8 weeks for wet macular degeneration       Follow-up Information    Kathyrn Lass, MD. Schedule an appointment as soon as possible for a visit in 1 week(s).   Specialty:  Family Medicine Why:  and your Pulmonary MD in 1 week Contact information: [place] [address] [phone]           Major procedures and Radiology Reports - PLEASE review detailed and final reports thoroughly  -        Dg Chest Port 1 View  Result Date: 12/08/2018 CLINICAL DATA:  Shortness of breath and chest tightness 1 day. EXAM: PORTABLE CHEST 1 VIEW COMPARISON:  12/13/2016 FINDINGS: Lungs are adequately inflated without focal airspace consolidation or effusion. Mild increased lucency over the mid to upper lungs suggesting emphysematous disease. Cardiomediastinal silhouette and remainder of the exam is unchanged. IMPRESSION: No active disease. Electronically Signed   By: Marin Olp M.D.   On: 12/08/2018 18:42    Micro  Results     No results found for this or any previous visit (from the past 240 hour(s)).  Today   Subjective    Valerie Gay today has no headache,no chest abdominal pain,no new weakness tingling or numbness, feels much better wants to go home today.     Objective   Blood pressure 118/61, pulse 79, temperature 97.6 F (36.4 C), temperature source Oral, resp. rate 18, height 5' 2.5" (1.588 m), weight 50.6 kg, SpO2 98 %.  No intake or output data in the 24 hours ending 12/10/18 0954  Exam  Awake Alert, Oriented x 3, No new F.N deficits, Normal affect [place].AT,PERRAL Supple Neck,No JVD, No cervical lymphadenopathy appriciated.  Symmetrical Chest wall movement, Mod air movement bilaterally, no wheezing RRR,No Gallops,Rubs or new Murmurs, No Parasternal Heave +ve B.Sounds, Abd Soft, Non tender, No organomegaly appriciated, No rebound -guarding or rigidity. No Cyanosis, Clubbing or edema, No new Rash or bruise   Data Review   CBC w Diff:  Lab Results  Component Value Date   WBC 8.6 12/09/2018   HGB 11.7 (L) 12/09/2018   HCT 37.4 12/09/2018   PLT 419 (H) 12/09/2018   LYMPHOPCT 5 12/09/2018   MONOPCT 1 12/09/2018   EOSPCT 0 12/09/2018   BASOPCT 0 12/09/2018    CMP:  Lab Results  Component Value Date   NA 139 12/09/2018   K 4.0 12/10/2018   CL 102 12/09/2018  CO2 31 12/09/2018   BUN 20 12/09/2018   CREATININE 0.58 12/09/2018   PROT 6.9 04/13/2017   ALBUMIN 3.5 04/13/2017   BILITOT 0.8 04/13/2017   ALKPHOS 79 04/13/2017   AST 17 04/13/2017   ALT 10 (L) 04/13/2017  .   Total Time in preparing paper work, data evaluation and todays exam - 75 minutes  Lala Lund M.D on 12/10/2018 at 9:54 AM  [facility]   Office  [phone]

## 2018-12-10 NOTE — Discharge Instructions (Signed)
Follow with Primary MD Kathyrn Lass, MD in 7 days   Get CBC, CMP, 2 view Chest X ray -  checked  by Primary MD in 5-7 days   Activity: As tolerated with Full fall precautions use walker/cane & assistance as needed  Disposition Home    Diet: Heart Healthy    Special Instructions: If you have smoked or chewed Tobacco  in the last 2 yrs please stop smoking, stop any regular Alcohol  and or any Recreational drug use.  On your next visit with your primary care physician please Get Medicines reviewed and adjusted.  Please request your Prim.MD to go over all Hospital Tests and Procedure/Radiological results at the follow up, please get all Hospital records sent to your Prim MD by signing hospital release before you go home.  If you experience worsening of your admission symptoms, develop shortness of breath, life threatening emergency, suicidal or homicidal thoughts you must seek medical attention immediately by calling 911 or calling your MD immediately  if symptoms less severe.  You Must read complete instructions/literature along with all the possible adverse reactions/side effects for all the Medicines you take and that have been prescribed to you. Take any new Medicines after you have completely understood and accpet all the possible adverse reactions/side effects.

## 2018-12-11 DIAGNOSIS — Z7982 Long term (current) use of aspirin: Secondary | ICD-10-CM | POA: Diagnosis not present

## 2018-12-11 DIAGNOSIS — F17210 Nicotine dependence, cigarettes, uncomplicated: Secondary | ICD-10-CM | POA: Diagnosis not present

## 2018-12-11 DIAGNOSIS — Z9981 Dependence on supplemental oxygen: Secondary | ICD-10-CM | POA: Diagnosis not present

## 2018-12-11 DIAGNOSIS — J441 Chronic obstructive pulmonary disease with (acute) exacerbation: Secondary | ICD-10-CM | POA: Diagnosis not present

## 2018-12-11 DIAGNOSIS — M6281 Muscle weakness (generalized): Secondary | ICD-10-CM | POA: Diagnosis not present

## 2018-12-11 DIAGNOSIS — J9621 Acute and chronic respiratory failure with hypoxia: Secondary | ICD-10-CM | POA: Diagnosis not present

## 2018-12-11 DIAGNOSIS — Z7951 Long term (current) use of inhaled steroids: Secondary | ICD-10-CM | POA: Diagnosis not present

## 2018-12-12 DIAGNOSIS — Z7951 Long term (current) use of inhaled steroids: Secondary | ICD-10-CM | POA: Diagnosis not present

## 2018-12-12 DIAGNOSIS — M6281 Muscle weakness (generalized): Secondary | ICD-10-CM | POA: Diagnosis not present

## 2018-12-12 DIAGNOSIS — J441 Chronic obstructive pulmonary disease with (acute) exacerbation: Secondary | ICD-10-CM | POA: Diagnosis not present

## 2018-12-12 DIAGNOSIS — J9621 Acute and chronic respiratory failure with hypoxia: Secondary | ICD-10-CM | POA: Diagnosis not present

## 2018-12-12 DIAGNOSIS — Z9981 Dependence on supplemental oxygen: Secondary | ICD-10-CM | POA: Diagnosis not present

## 2018-12-12 DIAGNOSIS — F17210 Nicotine dependence, cigarettes, uncomplicated: Secondary | ICD-10-CM | POA: Diagnosis not present

## 2018-12-13 ENCOUNTER — Telehealth: Payer: Self-pay | Admitting: Internal Medicine

## 2018-12-13 LAB — HIV ANTIBODY (ROUTINE TESTING W REFLEX): HIV Screen 4th Generation wRfx: REACTIVE — AB

## 2018-12-13 LAB — RNA QUALITATIVE: HIV 1 RNA Qualitative: 1

## 2018-12-13 LAB — HIV 1/2 AB DIFFERENTIATION
HIV 1 AB: NEGATIVE
HIV 2 Ab: NEGATIVE
Note: NEGATIVE

## 2018-12-13 NOTE — Telephone Encounter (Signed)
Spoke with Clair Gulling with [facility]. He is aware of MR's response. Nothing further was needed.

## 2018-12-13 NOTE — Telephone Encounter (Signed)
Ok to do PT as advised

## 2018-12-13 NOTE — Telephone Encounter (Signed)
Spoke with Clair Gulling, PT with [facility]  He states needing verbal ok for physical therapy 2 times a week for 3 weeks and order OT evaluation for ADL's He reached out to the PCP for this and they would not approve it because they had not seen the pt in a while  Please advise thanks

## 2018-12-16 ENCOUNTER — Telehealth: Payer: Self-pay | Admitting: Internal Medicine

## 2018-12-16 DIAGNOSIS — Z9981 Dependence on supplemental oxygen: Secondary | ICD-10-CM | POA: Diagnosis not present

## 2018-12-16 DIAGNOSIS — J9621 Acute and chronic respiratory failure with hypoxia: Secondary | ICD-10-CM | POA: Diagnosis not present

## 2018-12-16 DIAGNOSIS — F17210 Nicotine dependence, cigarettes, uncomplicated: Secondary | ICD-10-CM | POA: Diagnosis not present

## 2018-12-16 DIAGNOSIS — J441 Chronic obstructive pulmonary disease with (acute) exacerbation: Secondary | ICD-10-CM | POA: Diagnosis not present

## 2018-12-16 DIAGNOSIS — Z7951 Long term (current) use of inhaled steroids: Secondary | ICD-10-CM | POA: Diagnosis not present

## 2018-12-16 DIAGNOSIS — M6281 Muscle weakness (generalized): Secondary | ICD-10-CM | POA: Diagnosis not present

## 2018-12-16 NOTE — Telephone Encounter (Signed)
2. She is on continuous O2 without humidifier and she is reporting significant dryness and wondering if MR would like to add humidification to this.  If so, will need order faxed [phone]. 3. Patient reports congestion in her lungs that she cannot get up.  Wondering if a flutter valve would be appropriate.  If so, will need order for this.

## 2018-12-16 NOTE — Telephone Encounter (Signed)
Spoke with Herbert Deaner, PT with [facility] She confirmed the patient needing to know if she needs to come in to be seen. Already has appt to see PCP next week. Patient is on continuous O2 and needs to know if a humidifier can be added to this. And would like a flutter valve ordered as she has congestion in lungs she cannot get out.  Patient last seen in clinic 08/17/18. D/c from hospital 12/10/18 Message routed to MR  MR - please see above and advise. Thank you.

## 2018-12-16 NOTE — Telephone Encounter (Signed)
LVM for Valerie Gay, PT with [facility] requesting call back to verify patient need and status before placing any order if needed.

## 2018-12-16 NOTE — Telephone Encounter (Signed)
Order placed. Spoke with patient to schedule appointment Advised her order for humidified and flutter valve were approved by Dr. Chase Caller and placed. Patient will have to call back next week to schedule. Has to check with her daughter on day she can bring her. Nothing further needed at this time.

## 2018-12-16 NOTE — Telephone Encounter (Signed)
[place] for flutter valve 2. [place] for [employer] 3. Post COPD hospitalization - yes should see APP  < 2 weeks after discharge

## 2018-12-19 ENCOUNTER — Telehealth: Payer: Self-pay | Admitting: Internal Medicine

## 2018-12-19 NOTE — Telephone Encounter (Signed)
Called and spoke with [place], [facility]. Verbal order from Dr Chase Caller given to start skilled nursing, [facility] aide, and occupational eval.  Understanding stated. Nothing further att this time.

## 2018-12-19 NOTE — Telephone Encounter (Signed)
That is fine 

## 2018-12-19 NOTE — Telephone Encounter (Signed)
[facility], [phone], is requesting a order for Patient to have skilled nursing for 2 times a week for 3 weeks, then 1 time a week for 6 weeks,a [place] aide 1 time a week for 9 weeks, and a Occupational therapy for an eval visit.  Will route to Dr Chase Caller

## 2018-12-20 ENCOUNTER — Other Ambulatory Visit: Payer: Self-pay | Admitting: *Deleted

## 2018-12-20 ENCOUNTER — Telehealth: Payer: Self-pay | Admitting: Internal Medicine

## 2018-12-20 DIAGNOSIS — R634 Abnormal weight loss: Secondary | ICD-10-CM | POA: Diagnosis not present

## 2018-12-20 DIAGNOSIS — Z23 Encounter for immunization: Secondary | ICD-10-CM | POA: Diagnosis not present

## 2018-12-20 DIAGNOSIS — Z87891 Personal history of nicotine dependence: Secondary | ICD-10-CM | POA: Diagnosis not present

## 2018-12-20 DIAGNOSIS — J9611 Chronic respiratory failure with hypoxia: Secondary | ICD-10-CM | POA: Diagnosis not present

## 2018-12-20 DIAGNOSIS — J449 Chronic obstructive pulmonary disease, unspecified: Secondary | ICD-10-CM | POA: Diagnosis not present

## 2018-12-20 DIAGNOSIS — Z681 Body mass index (BMI) 19 or less, adult: Secondary | ICD-10-CM | POA: Diagnosis not present

## 2018-12-20 MED ORDER — ALBUTEROL SULFATE HFA 108 (90 BASE) MCG/ACT IN AERS: 2.0000 | INHALATION_SPRAY | Freq: Four times a day (QID) | RESPIRATORY_TRACT | 3 refills | Status: AC | PRN

## 2018-12-20 NOTE — Telephone Encounter (Signed)
Valerie Gay from [facility] returning phone call.  Phone number is [phone].

## 2018-12-20 NOTE — Telephone Encounter (Signed)
Called Valerie Gay, unable to reach left message to call us back.

## 2018-12-21 DIAGNOSIS — F17210 Nicotine dependence, cigarettes, uncomplicated: Secondary | ICD-10-CM | POA: Diagnosis not present

## 2018-12-21 DIAGNOSIS — Z9981 Dependence on supplemental oxygen: Secondary | ICD-10-CM | POA: Diagnosis not present

## 2018-12-21 DIAGNOSIS — J9621 Acute and chronic respiratory failure with hypoxia: Secondary | ICD-10-CM | POA: Diagnosis not present

## 2018-12-21 DIAGNOSIS — J441 Chronic obstructive pulmonary disease with (acute) exacerbation: Secondary | ICD-10-CM | POA: Diagnosis not present

## 2018-12-21 DIAGNOSIS — Z7951 Long term (current) use of inhaled steroids: Secondary | ICD-10-CM | POA: Diagnosis not present

## 2018-12-21 DIAGNOSIS — M6281 Muscle weakness (generalized): Secondary | ICD-10-CM | POA: Diagnosis not present

## 2018-12-21 NOTE — Telephone Encounter (Signed)
Left message with Manuela Schwartz informing her that this patient has not received Prevnar 13 per our records. Left message at her request. Nothing further is needed at this time.

## 2018-12-21 NOTE — Telephone Encounter (Signed)
Called Valerie Gay, unable to reach left message to give [place] a call back.

## 2018-12-21 NOTE — Telephone Encounter (Signed)
Valerie Gay returning phone call.  Valerie Gay states can leave a message.  Phone number [phone].

## 2018-12-23 DIAGNOSIS — J9621 Acute and chronic respiratory failure with hypoxia: Secondary | ICD-10-CM | POA: Diagnosis not present

## 2018-12-23 DIAGNOSIS — M6281 Muscle weakness (generalized): Secondary | ICD-10-CM | POA: Diagnosis not present

## 2018-12-23 DIAGNOSIS — Z9981 Dependence on supplemental oxygen: Secondary | ICD-10-CM | POA: Diagnosis not present

## 2018-12-23 DIAGNOSIS — F17210 Nicotine dependence, cigarettes, uncomplicated: Secondary | ICD-10-CM | POA: Diagnosis not present

## 2018-12-23 DIAGNOSIS — J441 Chronic obstructive pulmonary disease with (acute) exacerbation: Secondary | ICD-10-CM | POA: Diagnosis not present

## 2018-12-23 DIAGNOSIS — Z7951 Long term (current) use of inhaled steroids: Secondary | ICD-10-CM | POA: Diagnosis not present

## 2018-12-26 ENCOUNTER — Telehealth: Payer: Self-pay | Admitting: Internal Medicine

## 2018-12-26 DIAGNOSIS — Z9981 Dependence on supplemental oxygen: Secondary | ICD-10-CM | POA: Diagnosis not present

## 2018-12-26 DIAGNOSIS — F17210 Nicotine dependence, cigarettes, uncomplicated: Secondary | ICD-10-CM | POA: Diagnosis not present

## 2018-12-26 DIAGNOSIS — J9621 Acute and chronic respiratory failure with hypoxia: Secondary | ICD-10-CM | POA: Diagnosis not present

## 2018-12-26 DIAGNOSIS — Z7951 Long term (current) use of inhaled steroids: Secondary | ICD-10-CM | POA: Diagnosis not present

## 2018-12-26 DIAGNOSIS — J441 Chronic obstructive pulmonary disease with (acute) exacerbation: Secondary | ICD-10-CM | POA: Diagnosis not present

## 2018-12-26 DIAGNOSIS — M6281 Muscle weakness (generalized): Secondary | ICD-10-CM | POA: Diagnosis not present

## 2018-12-26 NOTE — Telephone Encounter (Signed)
Spoke with [name] at [facility], requesting a Verbal Order for OT once weekly X4 weeks.  MR please advise if ok to order.  Thanks!

## 2018-12-27 ENCOUNTER — Other Ambulatory Visit: Payer: Self-pay | Admitting: Family Medicine

## 2018-12-27 DIAGNOSIS — J9621 Acute and chronic respiratory failure with hypoxia: Secondary | ICD-10-CM | POA: Diagnosis not present

## 2018-12-27 DIAGNOSIS — M6281 Muscle weakness (generalized): Secondary | ICD-10-CM | POA: Diagnosis not present

## 2018-12-27 DIAGNOSIS — Z7951 Long term (current) use of inhaled steroids: Secondary | ICD-10-CM | POA: Diagnosis not present

## 2018-12-27 DIAGNOSIS — F17210 Nicotine dependence, cigarettes, uncomplicated: Secondary | ICD-10-CM | POA: Diagnosis not present

## 2018-12-27 DIAGNOSIS — Z9981 Dependence on supplemental oxygen: Secondary | ICD-10-CM | POA: Diagnosis not present

## 2018-12-27 DIAGNOSIS — J441 Chronic obstructive pulmonary disease with (acute) exacerbation: Secondary | ICD-10-CM | POA: Diagnosis not present

## 2018-12-27 DIAGNOSIS — M79604 Pain in right leg: Secondary | ICD-10-CM

## 2018-12-27 NOTE — Telephone Encounter (Signed)
Left a detailed message on Ally's named VM giving a VO for OT once weekly X4 weeks.  Nothing further needed at this time- will close encounter.

## 2018-12-27 NOTE — Telephone Encounter (Signed)
That is fine 

## 2018-12-29 DIAGNOSIS — Z7951 Long term (current) use of inhaled steroids: Secondary | ICD-10-CM | POA: Diagnosis not present

## 2018-12-29 DIAGNOSIS — M6281 Muscle weakness (generalized): Secondary | ICD-10-CM | POA: Diagnosis not present

## 2018-12-29 DIAGNOSIS — F17210 Nicotine dependence, cigarettes, uncomplicated: Secondary | ICD-10-CM | POA: Diagnosis not present

## 2018-12-29 DIAGNOSIS — Z9981 Dependence on supplemental oxygen: Secondary | ICD-10-CM | POA: Diagnosis not present

## 2018-12-29 DIAGNOSIS — J441 Chronic obstructive pulmonary disease with (acute) exacerbation: Secondary | ICD-10-CM | POA: Diagnosis not present

## 2018-12-29 DIAGNOSIS — J9621 Acute and chronic respiratory failure with hypoxia: Secondary | ICD-10-CM | POA: Diagnosis not present

## 2019-01-03 ENCOUNTER — Telehealth: Payer: Self-pay | Admitting: Internal Medicine

## 2019-01-03 DIAGNOSIS — M6281 Muscle weakness (generalized): Secondary | ICD-10-CM | POA: Diagnosis not present

## 2019-01-03 DIAGNOSIS — Z7951 Long term (current) use of inhaled steroids: Secondary | ICD-10-CM | POA: Diagnosis not present

## 2019-01-03 DIAGNOSIS — J441 Chronic obstructive pulmonary disease with (acute) exacerbation: Secondary | ICD-10-CM | POA: Diagnosis not present

## 2019-01-03 DIAGNOSIS — J9621 Acute and chronic respiratory failure with hypoxia: Secondary | ICD-10-CM | POA: Diagnosis not present

## 2019-01-03 DIAGNOSIS — F17210 Nicotine dependence, cigarettes, uncomplicated: Secondary | ICD-10-CM | POA: Diagnosis not present

## 2019-01-03 DIAGNOSIS — Z9981 Dependence on supplemental oxygen: Secondary | ICD-10-CM | POA: Diagnosis not present

## 2019-01-03 MED ORDER — ALBUTEROL SULFATE (2.5 MG/3ML) 0.083% IN NEBU: INHALATION_SOLUTION | RESPIRATORY_TRACT | 0 refills | Status: DC

## 2019-01-03 NOTE — Telephone Encounter (Signed)
Patient called regarding Rx refill for her nebulizer albuterol. Rx sent to [facility] per patient request. Nothing further needed.

## 2019-01-04 DIAGNOSIS — J9621 Acute and chronic respiratory failure with hypoxia: Secondary | ICD-10-CM | POA: Diagnosis not present

## 2019-01-04 DIAGNOSIS — Z9981 Dependence on supplemental oxygen: Secondary | ICD-10-CM | POA: Diagnosis not present

## 2019-01-04 DIAGNOSIS — Z7951 Long term (current) use of inhaled steroids: Secondary | ICD-10-CM | POA: Diagnosis not present

## 2019-01-04 DIAGNOSIS — M6281 Muscle weakness (generalized): Secondary | ICD-10-CM | POA: Diagnosis not present

## 2019-01-04 DIAGNOSIS — F17210 Nicotine dependence, cigarettes, uncomplicated: Secondary | ICD-10-CM | POA: Diagnosis not present

## 2019-01-04 DIAGNOSIS — J441 Chronic obstructive pulmonary disease with (acute) exacerbation: Secondary | ICD-10-CM | POA: Diagnosis not present

## 2019-01-10 ENCOUNTER — Telehealth: Payer: Self-pay | Admitting: Internal Medicine

## 2019-01-10 DIAGNOSIS — J441 Chronic obstructive pulmonary disease with (acute) exacerbation: Secondary | ICD-10-CM | POA: Diagnosis not present

## 2019-01-10 DIAGNOSIS — F17210 Nicotine dependence, cigarettes, uncomplicated: Secondary | ICD-10-CM | POA: Diagnosis not present

## 2019-01-10 DIAGNOSIS — Z7951 Long term (current) use of inhaled steroids: Secondary | ICD-10-CM | POA: Diagnosis not present

## 2019-01-10 DIAGNOSIS — M6281 Muscle weakness (generalized): Secondary | ICD-10-CM | POA: Diagnosis not present

## 2019-01-10 DIAGNOSIS — Z7982 Long term (current) use of aspirin: Secondary | ICD-10-CM | POA: Diagnosis not present

## 2019-01-10 DIAGNOSIS — J9621 Acute and chronic respiratory failure with hypoxia: Secondary | ICD-10-CM | POA: Diagnosis not present

## 2019-01-10 DIAGNOSIS — Z9981 Dependence on supplemental oxygen: Secondary | ICD-10-CM | POA: Diagnosis not present

## 2019-01-10 NOTE — Telephone Encounter (Signed)
Please make appointment for pateint to be seen and evaluated. Thanks.

## 2019-01-10 NOTE — Telephone Encounter (Signed)
Spoke with [employer]. She stated that since Sunday, the patient has been complaining of increased SOB. She denies any chest pain, but does have some chest tightness. Feels like she can't take a deep breath at times. O2 was currently at 98% on 3L. Denies any fever or chills.   She has an appt to see MR on 01/17/19 at 1015.   Beth wanted to we had any ideas for the patient to get rid of the chest tightness. Advised her that MR is not here today.   Tonya, please advise if possible. Thanks!

## 2019-01-10 NOTE — Telephone Encounter (Signed)
Spoke with [employer]. She is aware of Tonya's recommendations.   Left message with patient to see if she wants to come in sooner than next week.

## 2019-01-10 NOTE — Telephone Encounter (Signed)
Lungs diminished more than her usual and not moving very much air.

## 2019-01-11 ENCOUNTER — Ambulatory Visit (INDEPENDENT_AMBULATORY_CARE_PROVIDER_SITE_OTHER): Payer: Medicare Other | Admitting: Primary Care

## 2019-01-11 ENCOUNTER — Other Ambulatory Visit: Payer: Self-pay | Admitting: Internal Medicine

## 2019-01-11 ENCOUNTER — Encounter: Payer: Self-pay | Admitting: Primary Care

## 2019-01-11 VITALS — BP 110/62 | HR 106 | Temp 98.2°F | Ht 62.5 in | Wt 107.0 lb

## 2019-01-11 DIAGNOSIS — J441 Chronic obstructive pulmonary disease with (acute) exacerbation: Secondary | ICD-10-CM

## 2019-01-11 MED ORDER — DOXYCYCLINE HYCLATE 100 MG PO TABS: 100.0000 mg | ORAL_TABLET | Freq: Two times a day (BID) | ORAL | 0 refills | Status: DC

## 2019-01-11 MED ORDER — IPRATROPIUM-ALBUTEROL 0.5-2.5 (3) MG/3ML IN SOLN
3.0000 mL | Freq: Once | RESPIRATORY_TRACT | Status: AC
  Administered 2019-01-11: 3 mL via RESPIRATORY_TRACT

## 2019-01-11 MED ORDER — METHYLPREDNISOLONE ACETATE 80 MG/ML IJ SUSP
60.0000 mg | Freq: Once | INTRAMUSCULAR | Status: AC
  Administered 2019-01-11: 60 mg via INTRAMUSCULAR

## 2019-01-11 MED ORDER — PREDNISONE 10 MG PO TABS: ORAL_TABLET | ORAL | 0 refills | Status: DC

## 2019-01-11 NOTE — Telephone Encounter (Signed)
Spoke with pt.  She agreed to be seen sooner.  Appt made for 230pm 01/11/2019 with Beth.  Nothing further is needed.

## 2019-01-11 NOTE — Patient Instructions (Addendum)
Office treatment: - Depo-medrol 60 mg x1  - Duoneb x1   RX: - Start Prednisone taper tomorrow 2/27 (40mg  x 3 days; 30mg  x 3 days; 20mg  x 3 days; 10mg  x 3 days) - Doxycyline 1 tab twice daily x 1 week   Recommendations: - Continue Incruse and Symbicort as prescribed  - Use Albuterol nebulizer every 4-6 hours as needed for shortness of breath and wheezing  - Use incentive spirometer every hour while awake   * At next visit needs to qualify for POC, would likely need to change DME company to either [place] or [facility]   Follow-up: - Has visit scheduled with Dr. Chase Caller next week on 3/3/ at 10:15am  - If symptoms worsen or do not improve return sooner or present to ED

## 2019-01-11 NOTE — Progress Notes (Signed)
@Patient  ID: Valerie Gay, female    DOB: Dec 24, 1940, 78 y.o.   MRN: [id number]  Chief Complaint  Patient presents with  . Acute Visit    chest tightness, wheezing & increased sob and fatigue x2d    Referring provider: Kathyrn Lass, MD  HPI: 78 year old female, current smoker. PMH moderate COPD, acute on chronic respiratory failure (O2 dependent on 3L). Patient of Dr. Chase Caller, last seen 08/17/18. Recent hospitalization in January for COPD exacerbation. Maintained on Tudorza and Symbicort.   01/11/2019 Patient presents today for acute visit with complaints of sob with associated chest tightness and wheezing. Symptoms started 4 days ago. Denies cough. Using partial Albuterol nebulizer treatments approx 5 times a day with some temporary relief. No increased oxygen demand. Daughter states they would like to change DME companies because [facility] does not offer portable oxygen concentrator. Afebrile.   Allergies  Allergen Reactions  . Spiriva Handihaler [Tiotropium Bromide Monohydrate] Hives, Itching and Rash     on upper body  . Banana Itching    Immunization History  Administered Date(s) Administered  . H1N1 11/05/2008  . Influenza Whole 07/29/2014  . Influenza,inj,Quad PF,6+ Mos 09/27/2015, 12/01/2016  . Pneumococcal Polysaccharide-23 03/28/2014    Past Medical History:  Diagnosis Date  . Anemia age 85  . Cellulitis and abscess of foot 03/2017   rt foot  . Colon polyps    2010   . COPD (chronic obstructive pulmonary disease) ([place])   . Diverticulosis   . History of hiatal hernia   . History of kidney stones   . Hyperlipidemia   . On home oxygen therapy     Tobacco History: Social History   Tobacco Use  Smoking Status Current Every Day Smoker  . Packs/day: 0.50  . Years: 60.00  . Pack years: 30.00  . Types: Cigarettes  Smokeless Tobacco Never Used  Tobacco Comment   5 cigs per day 10.2.19 ep   Ready to quit: Not Answered Counseling given: Not  Answered Comment: 5 cigs per day 10.2.19 ep   Outpatient Medications Prior to Visit  Medication Sig Dispense Refill  . acetaminophen (TYLENOL) 325 MG tablet Take 2 tablets (650 mg total) by mouth every 6 (six) hours as needed for mild pain, moderate pain or fever (or Fever >/= 101).    [name] albuterol (PROVENTIL HFA;VENTOLIN HFA) 108 (90 Base) MCG/ACT inhaler Inhale 2 puffs into the lungs every 6 (six) hours as needed for wheezing or shortness of breath. 3 Inhaler 3  . albuterol (PROVENTIL) (2.5 MG/3ML) 0.083% nebulizer solution INHALE CONTENTS OF 1 VIAL IN NEBULIZER EVERY 8 HOURS IF NEEDED FOR WHEEZING. 360 mL 0  . aspirin EC 81 MG tablet Take 81 mg by mouth daily.    [name] co-enzyme Q-10 50 MG capsule Take 50 mg by mouth daily.    . hydroxypropyl methylcellulose / hypromellose (ISOPTO TEARS / GONIOVISC) 2.5 % ophthalmic solution Place 2 drops into both eyes 3 (three) times daily as needed for dry eyes.    . INCRUSE ELLIPTA 62.5 MCG/INH AEPB USE 1 INHALATION DAILY (Patient taking differently: Inhale 1 puff into the lungs daily. ) 90 each 3  . OXYGEN Inhale 3 L into the lungs continuous.     . Triamcinolone Acetonide (NASACORT ALLERGY 24HR NA) Place 1 spray into both nostrils daily as needed (for allergies).     [name] UNKNOWN TO PATIENT Place into the right eye See admin instructions. Eye injection (name unknown to patient): Inject into the right eye  every 7-8 weeks for wet macular degeneration    . albuterol (PROVENTIL) (2.5 MG/3ML) 0.083% nebulizer solution USE 1 VIAL VIA NEBULIZER EVERY 8 HOURS AS NEEDED FOR WHEEZING 270 mL 0  . aspirin EC 325 MG tablet Take 325 mg by mouth as needed (for pain or headaches).    . budesonide-formoterol (SYMBICORT) 160-4.5 MCG/ACT inhaler USE 2 INHALATIONS TWICE A DAY (Patient taking differently: Inhale 2 puffs into the lungs 2 (two) times daily. ) 30.6 g 6  . predniSONE (DELTASONE) 5 MG tablet Label  & dispense according to the schedule below. take 8 Pills PO for 3 days, 6  Pills PO for 3 days, 4 Pills PO for 3 days, 2 Pills PO for 3 days, 1 Pills PO for 3 days, 1/2 Pill  PO for 3 days then STOP. Total 65 pills. 65 tablet 0   No facility-administered medications prior to visit.     Review of Systems  Review of Systems  Constitutional: Negative.   HENT: Negative.   Respiratory: Positive for shortness of breath and wheezing. Negative for cough.   Cardiovascular: Negative.     Physical Exam  BP 110/62 (BP Location: Left Arm, Cuff Size: Normal)   Pulse (!) 106   Temp 98.2 F (36.8 C) (Oral)   Ht 5' 2.5" (1.588 m)   Wt 107 lb (48.5 kg) Comment: weight is per pt  SpO2 98%   BMI 19.26 kg/m  Physical Exam Constitutional:      General: She is not in acute distress.    Appearance: Normal appearance. She is not ill-appearing.  HENT:     Head: Normocephalic and atraumatic.     Right Ear: Tympanic membrane normal.     Left Ear: Tympanic membrane normal.     Mouth/Throat:     Mouth: Mucous membranes are moist.     Pharynx: Oropharynx is clear.  Neck:     Musculoskeletal: Normal range of motion and neck supple.  Cardiovascular:     Rate and Rhythm: Normal rate.  Pulmonary:     Effort: Pulmonary effort is normal. No respiratory distress.     Breath sounds: No wheezing.     Comments: Diminished t/o; sob with speaking  Musculoskeletal:     Comments: In WC  Neurological:     General: No focal deficit present.     Mental Status: She is alert and oriented to person, place, and time. Mental status is at baseline.  Psychiatric:        Mood and Affect: Mood normal.        Behavior: Behavior normal.        Thought Content: Thought content normal.        Judgment: Judgment normal.      Lab Results:  CBC    Component Value Date/Time   WBC 8.6 12/09/2018 0525   RBC 3.79 (L) 12/09/2018 0525   HGB 11.7 (L) 12/09/2018 0525   HCT 37.4 12/09/2018 0525   PLT 419 (H) 12/09/2018 0525   MCV 98.7 12/09/2018 0525   MCH 30.9 12/09/2018 0525   MCHC 31.3  12/09/2018 0525   RDW 14.5 12/09/2018 0525   LYMPHSABS 0.4 (L) 12/09/2018 0525   MONOABS 0.1 12/09/2018 0525   EOSABS 0.0 12/09/2018 0525   BASOSABS 0.0 12/09/2018 0525    BMET    Component Value Date/Time   NA 139 12/09/2018 0525   K 4.0 12/10/2018 0334   CL 102 12/09/2018 0525   CO2 31 12/09/2018 0525   GLUCOSE  218 (H) 12/09/2018 0525   BUN 20 12/09/2018 0525   CREATININE 0.58 12/09/2018 0525   CALCIUM 8.8 (L) 12/09/2018 0525   GFRNONAA >60 12/09/2018 0525   GFRAA >60 12/09/2018 0525    BNP No results found for: BNP  ProBNP    Component Value Date/Time   PROBNP 50.0 09/26/2008 1205    Imaging: No results found.   Assessment & Plan:   COPD with acute exacerbation ([place]) - Complains of sob with associated wheezing/chest tightness x 4 days - Lung sounds diminished on exam  - Received depo-medrol 60mg  IM injection and Duoneb x1 in office today - Needs RX for prednisone taper and doxycyline 1 tab BID x 7 days - Encourage incentive spirometer every hour while awake  - Follow up already scheduled for 01/17/19, advised ED if symptoms worsen   Acute on chronic respiratory failure ([place]) - Continues 3L [place]; no additional oxygen requirements with acute exacerbation - Patient would like to change DME companies d/t [facility] not carrying POC, she will need to qualify at next office visit (recommend [facility] if able to go to patient house)   Martyn Ehrich, NP 01/12/2019

## 2019-01-12 ENCOUNTER — Ambulatory Visit: Payer: Medicare Other | Admitting: Adult Health

## 2019-01-12 ENCOUNTER — Encounter: Payer: Self-pay | Admitting: Primary Care

## 2019-01-12 NOTE — Assessment & Plan Note (Addendum)
-   Continues 3L [place]; no additional oxygen requirements with acute exacerbation - Patient would like to change DME companies d/t [facility] not carrying POC, she will need to qualify at next office visit (recommend [facility] if able to go to patient house)

## 2019-01-12 NOTE — Assessment & Plan Note (Addendum)
-   Complains of sob with associated wheezing/chest tightness x 4 days - Lung sounds diminished on exam  - Received depo-medrol 60mg  IM injection and Duoneb x1 in office today - Needs RX for prednisone taper and doxycyline 1 tab BID x 7 days - Encourage incentive spirometer every hour while awake  - Follow up already scheduled for 01/17/19, advised ED if symptoms worsen

## 2019-01-16 DIAGNOSIS — Z9981 Dependence on supplemental oxygen: Secondary | ICD-10-CM | POA: Diagnosis not present

## 2019-01-16 DIAGNOSIS — Z7951 Long term (current) use of inhaled steroids: Secondary | ICD-10-CM | POA: Diagnosis not present

## 2019-01-16 DIAGNOSIS — M6281 Muscle weakness (generalized): Secondary | ICD-10-CM | POA: Diagnosis not present

## 2019-01-16 DIAGNOSIS — F17210 Nicotine dependence, cigarettes, uncomplicated: Secondary | ICD-10-CM | POA: Diagnosis not present

## 2019-01-16 DIAGNOSIS — J9621 Acute and chronic respiratory failure with hypoxia: Secondary | ICD-10-CM | POA: Diagnosis not present

## 2019-01-16 DIAGNOSIS — J441 Chronic obstructive pulmonary disease with (acute) exacerbation: Secondary | ICD-10-CM | POA: Diagnosis not present

## 2019-01-17 ENCOUNTER — Ambulatory Visit: Payer: Medicare Other | Admitting: Internal Medicine

## 2019-01-17 ENCOUNTER — Ambulatory Visit (INDEPENDENT_AMBULATORY_CARE_PROVIDER_SITE_OTHER): Payer: Medicare Other | Admitting: Pulmonary Disease

## 2019-01-17 ENCOUNTER — Encounter: Payer: Self-pay | Admitting: Pulmonary Disease

## 2019-01-17 ENCOUNTER — Telehealth: Payer: Self-pay | Admitting: Pulmonary Disease

## 2019-01-17 ENCOUNTER — Other Ambulatory Visit: Payer: Self-pay

## 2019-01-17 VITALS — BP 106/64 | HR 112 | Ht 62.5 in | Wt 105.6 lb

## 2019-01-17 DIAGNOSIS — J449 Chronic obstructive pulmonary disease, unspecified: Secondary | ICD-10-CM | POA: Diagnosis not present

## 2019-01-17 DIAGNOSIS — F17200 Nicotine dependence, unspecified, uncomplicated: Secondary | ICD-10-CM

## 2019-01-17 DIAGNOSIS — J9611 Chronic respiratory failure with hypoxia: Secondary | ICD-10-CM

## 2019-01-17 DIAGNOSIS — F17210 Nicotine dependence, cigarettes, uncomplicated: Secondary | ICD-10-CM | POA: Diagnosis not present

## 2019-01-17 MED ORDER — NICOTINE POLACRILEX 4 MG MT LOZG: 4.0000 mg | LOZENGE | OROMUCOSAL | 0 refills | Status: DC | PRN

## 2019-01-17 MED ORDER — PREDNISONE 10 MG PO TABS: 20.0000 mg | ORAL_TABLET | Freq: Every day | ORAL | 0 refills | Status: DC

## 2019-01-17 NOTE — Assessment & Plan Note (Addendum)
Assessment: Current every day smoker Smoking 2 cigarettes a day Patient is willing to stop smoking but has not set quit date 30-pack-year smoking history Patient is willing to try nicotine replacement lozenges  Plan: You need to set a quit date We recommend that you stop smoking Prescription for 4 mg nicotine lozenges sent to pharmacy to help with curving nicotine cravings and stopping smoking We will need to consider repeating pulmonary function testing in 2 to 3 months

## 2019-01-17 NOTE — Telephone Encounter (Signed)
Prescription called in and patient notified of instruction

## 2019-01-17 NOTE — Assessment & Plan Note (Addendum)
Assessment: 2015 pulmonary function test showing FEV1 of 1.1 152% predicted 2017 CT chest showing emphysema Recent COPD exacerbation in February/2020 m[facility]C 4 today Still currently smoking 1 to 2 cigarettes a day with ongoing nicotine cravings Maintained on Symbicort 160 as well as Incruse Ellipta Breath sounds are diminished throughout exam but no audible wheezing which is an improvement per last office note Patient still reporting fatigue  Plan: Walk today in office to see if we can qualify for POC Continue Symbicort 160 Continue Incruse Ellipta Emphasized the importance of the patient that she needs to set a quit date as well as to stop smoking Sent in prescription for 4 mg nicotine lozenge to help with curving nicotine cravings Continue flutter valve use Continue incentive spirometer Emphasized the importance the patient needs to improve physical activity and daily exercise We will have close follow-up with the patient in 2 to 4 weeks to ensure improvement of symptoms Patient needs increase physical activity Can consider prescribing vitamin D at next office visit Consider pulmonary rehab referral in the future, offered today patient declined Need to consider repeat pulmonary function testing in the next couple of months as last PFTs were in 2015 and patient has continued to smoke

## 2019-01-17 NOTE — Progress Notes (Signed)
Contacted patient to notify of prednisone as prescribed by Wyn Quaker FNP forPrednisone 10mg  tablet  >>>Take 2 tablets (20 mg total) daily for the next 3 days, then resume taper from 01/11/2019 OV >>> Take with food in the morning. Please send in the 6 tablets.  Prescription sent to [facility] per patient request.  Patient acknowledged understanding of instructions and will pick up prescription.  Nothing further needed.

## 2019-01-17 NOTE — Assessment & Plan Note (Signed)
Assessment: Currently maintained on 2 L via nasal cannula  Plan: Continue oxygen therapy as prescribed Walk today in office to see if she can qualify for a POC >>> Patient needed 4 L pulsed via POC to maintain oxygen saturations [facility] not [facility] POC so patient may need to be considered to switch DME companies Follow-up with our office in 2 to 4 weeks

## 2019-01-17 NOTE — Assessment & Plan Note (Deleted)
Current every day smoker Smoking 2 cigarettes a day 30-pack-year smoking history  01/17/2019-has tried Chantix in the past and felt that this was sedating but is willing to try in the future.  Patient has tried nicotine patches before does not feel that these work well.  Patient is willing to try nicotine lozenges.

## 2019-01-17 NOTE — Patient Instructions (Addendum)
Walk in office today to check for qualification POC  Continue Symbicort 160 >>> 2 puffs in the morning right when you wake up, rinse out your mouth after use, 12 hours later 2 puffs, rinse after use >>> Take this daily, no matter what >>> This is not a rescue inhaler   Continue Incruse Ellipta  >>> Take 1 puff daily  >>>Rinse your mouth out after use >>>This is a daily maintenance inhaler, NOT a rescue inhaler >>>Contact our office if you are having difficulties affording or obtaining this medication >>>It is important for you to be able to take this daily and not miss any doses   Only use your albuterol as a rescue medication to be used if you can't catch your breath by resting or doing a relaxed purse lip breathing pattern.  - The less you use it, the better it will work when you need it. - Ok to use up to 2 puffs  every 4 hours if you must but call for immediate appointment if use goes up over your usual need - Don't leave home without it !!  (think of it like the spare tire for your car)   Note your daily symptoms > remember "red flags" for COPD:   >>>Increase in cough >>>increase in sputum production >>>increase in shortness of breath or activity  intolerance.   If you notice these symptoms, please call the office to be seen.   Continue oxygen therapy as prescribed  >>>maintain oxygen saturations greater than 88 percent  >>>if unable to maintain oxygen saturations please contact the office  >>>do not smoke with oxygen  >>>can use nasal saline gel or nasal saline rinses to moisturize nose if oxygen causes dryness   We recommend that you stop smoking.  >>>You need to set a quit date >>>If you have friends or family who smoke, let them know you are trying to quit and not to smoke around you or in your living environment  Smoking Cessation Resources:  1 800 QUIT NOW  >>> Patient to call this resource and utilize it to help support her quit smoking >>> Keep up your hard work  with stopping smoking  You can also contact the [facility] >>>For smoking cessation classes call [phone]  We do not recommend using e-cigarettes as a form of stopping smoking  You can sign up for smoking cessation support texts and information:  >>>https://smokefree.gov/smokefreetxt  Nicotine lozenge: Lozenges are commonly uses short acting NRT product  >>>4mg  lozenge sent to pharmacy   Can use up to 1 lozenge every 1-2 hours for 6 weeks >>>Total amount of lozenges that can be used per day as 20 >>>Gradually reduce number of lozenges used per day after 2 weeks of use  Place lozenge in mouth and allowed to dissolve for 30 minutes loss and does not need to be chewed  Lozenges have advantages to be able to be used in people with TMG, poor dentition, dentures   Continue to use flutter valve twice daily next line >>> 10 breaths each time  Continue use of incentive spirometer hourly when awake   Continue to work to increase daily physical activity   Follow up in 2-4 weeks with Valerie Quaker FNP or Dr. Chase Caller   It is flu season:   >>> Best ways to protect herself from the flu: Receive the yearly flu vaccine, practice good hand hygiene washing with soap and also using hand sanitizer when available, eat a nutritious meals, get adequate rest,  hydrate appropriately   Please contact the office if your symptoms worsen or you have concerns that you are not improving.   Thank you for choosing [facility] for your healthcare, and for allowing [place] to partner with you on your healthcare journey. I am thankful to be able to provide care to you today.   Valerie Gay    Home Oxygen Use, Adult When a medical condition keeps you from getting enough oxygen, your health care provider may instruct you to take extra oxygen at home. Your health care provider will let you know:  When to take oxygen.  For how long to take oxygen.  How quickly oxygen should  be delivered (flow rate), in liters per minute (LPM or L/M). Home oxygen can be given through:  A mask.  A nasal cannula. This is a device or tube that goes in the nostrils.  A transtracheal catheter. This is a small, flexible tube placed in the trachea.  A tracheostomy. This is a surgically made opening in the trachea. These devices are connected with tubing to an oxygen source, such as:  A tank. Tanks hold oxygen in gas form. They must be replaced when the oxygen is used up.  A liquid oxygen device. This holds oxygen in liquid form. It must be replaced when the oxygen is used up.  An oxygen concentrator machine. This filters oxygen in the room. It uses electricity, so you must have a backup cylinder of oxygen in case the power goes out. Supplies needed: To use oxygen, you will need:  A mask, nasal cannula, transtracheal catheter, or tracheostomy.  An oxygen tank, a liquid oxygen device, or an oxygen concentrator.  The tape that your health care provider recommends (optional). If you use a transtracheal catheter and your prescribed flow rate is 1 LPM or greater, you will also need a humidifier. Risks and complications  Fire. This can happen if the oxygen is exposed to a heat source, flame, or spark.  Injury to skin. This can happen if liquid oxygen touches your skin.  Organ damage. This can happen if you get too little oxygen. How to use oxygen Your health care provider or a representative from your [place] will show you how to use your oxygen device. Follow her or his instructions. The instructions may look something like this: 1. Wash your hands. 2. If you use an oxygen concentrator, make sure it is plugged in. 3. Place one end of the tube into the port on the tank, device, or machine. 4. Place the mask over your nose and mouth. Or, place the nasal cannula and secure it with tape if instructed. If you use a tracheostomy or transtracheal catheter, connect it to  the oxygen source as directed. 5. Make sure the liter-flow setting on the machine is at the level prescribed by your health care provider. 6. Turn on the machine or adjust the knob on the tank or device to the correct liter-flow setting. 7. When you are done, turn off and unplug the machine, or turn the knob to OFF. How to clean and care for the oxygen supplies Nasal cannula  Clean it with a warm, wet cloth daily or as needed.  Wash it with a liquid soap once a week.  Rinse it thoroughly once or twice a week.  Replace it every 2-4 weeks.  If you have an infection, such as a cold or pneumonia, change the cannula when you get better. Mask  Replace it  every 2-4 weeks.  If you have an infection, such as a cold or pneumonia, change the mask when you get better. Humidifier bottle  Wash the bottle between each refill: ? Wash it with soap and warm water. ? Rinse it thoroughly. ? Disinfect it and its top. ? Air-dry it.  Make sure it is dry before you refill it. Oxygen concentrator  Clean the air filter at least twice a week according to directions from your home medical equipment and service company.  Wipe down the cabinet every day. To do this: ? Unplug the unit. ? Wipe down the cabinet with a damp cloth. ? Dry the cabinet. Other equipment  Change any extra tubing every 1-3 months.  Follow instructions from your health care provider about taking care of any other equipment. Safety tips Fire safety tips   Keep your oxygen and oxygen supplies at least 5 ft away from sources of heat, flames, and sparks at all times.  Do not allow smoking near your oxygen. Put up "no smoking" signs in your home. Avoid smoking areas when in public.  Do not use materials that can burn (are flammable) while you use oxygen.  When you go to a restaurant with portable oxygen, ask to be seated in the nonsmoking section.  Keep a Data processing manager close by. Let your fire department know that you  have oxygen in your home.  Test your home smoke detectors regularly. Traveling  Secure your oxygen tank in the vehicle so that it does not move around. Follow instructions from your medical device company about how to safely secure your tank.  Make sure you have enough oxygen for the amount of time you will be away from home.  If you are planning air travel, contact the airline to find out if they allow the use of an approved portable oxygen concentrator. You may also need documents from your health care provider and medical device company before you travel. General safety tips  If you use an oxygen cylinder, make sure it is in a stand or secured to an object that will not move (fixed object).  If you use liquid oxygen, make sure its container is kept upright.  If you use an oxygen concentrator: ? Dance movement psychotherapist company. Make sure you are given priority service in the event that your power goes out. ? Avoid using extension cords, if possible. Follow these instructions at home:  Use oxygen only as told by your health care provider.  Do not use alcohol or other drugs that make you relax (sedating drugs) unless instructed. They can slow down your breathing rate and make it hard to get in enough oxygen.  Know how and when to order a refill of oxygen.  Always keep a spare tank of oxygen. Plan ahead for holidays when you may not be able to get a prescription filled.  Use water-based lubricants on your lips or nostrils. Do not use oil-based products like petroleum jelly.  To prevent skin irritation on your cheeks or behind your ears, tuck some gauze under the tubing. Contact a health care provider if:  You get headaches often.  You have shortness of breath.  You have a lasting cough.  You have anxiety.  You are sleepy all the time.  You develop an illness that affects your breathing.  You cannot exercise at your regular level.  You are restless.  You have difficult or  irregular breathing, and it is getting worse.  You have a fever.  You have persistent redness under your nose. Get help right away if:  You are confused.  You have blue lips or fingernails.  You are struggling to breathe. Summary  Your health care provider or a representative from your [place] will show you how to use your oxygen device. Follow her or his instructions.  If you use an oxygen concentrator, make sure it is plugged in.  Make sure the liter-flow setting on the machine is at the level prescribed by your health care provider.  Keep your oxygen and oxygen supplies at least 5 ft away from sources of heat, flames, and sparks at all times. This information is not intended to replace advice given to you by your health care provider. Make sure you discuss any questions you have with your health care provider. Document Released: 01/23/2004 Document Revised: 04/21/2018 Document Reviewed: 05/26/2016 Elsevier Interactive Patient Education  2019 [employer].    Steps to Quit Smoking  Smoking tobacco can be bad for your health. It can also affect almost every organ in your body. Smoking puts you and people around you at risk for many serious long-lasting (chronic) diseases. Quitting smoking is hard, but it is one of the best things that you can do for your health. It is never too late to quit. What are the benefits of quitting smoking? When you quit smoking, you lower your risk for getting serious diseases and conditions. They can include:  Lung cancer or lung disease.  Heart disease.  Stroke.  Heart attack.  Not being able to have children (infertility).  Weak bones (osteoporosis) and broken bones (fractures). If you have coughing, wheezing, and shortness of breath, those symptoms may get better when you quit. You may also get sick less often. If you are pregnant, quitting smoking can help to lower your chances of having a baby of low birth weight. What  can I do to help me quit smoking? Talk with your doctor about what can help you quit smoking. Some things you can do (strategies) include:  Quitting smoking totally, instead of slowly cutting back how much you smoke over a period of time.  Going to in-person counseling. You are more likely to quit if you go to many counseling sessions.  Using resources and support systems, such as: ? Database administrator with a Social worker. ? Phone quitlines. ? Careers information officer. ? Support groups or group counseling. ? Text messaging programs. ? Mobile phone apps or applications.  Taking medicines. Some of these medicines may have nicotine in them. If you are pregnant or breastfeeding, do not take any medicines to quit smoking unless your doctor says it is okay. Talk with your doctor about counseling or other things that can help you. Talk with your doctor about using more than one strategy at the same time, such as taking medicines while you are also going to in-person counseling. This can help make quitting easier. What things can I do to make it easier to quit? Quitting smoking might feel very hard at first, but there is a lot that you can do to make it easier. Take these steps:  Talk to your family and friends. Ask them to support and encourage you.  Call phone quitlines, reach out to support groups, or work with a Social worker.  Ask people who smoke to not smoke around you.  Avoid places that make you want (trigger) to smoke, such as: ? Bars. ? Parties. ? Smoke-break areas at work.  Spend time with people  who do not smoke.  Lower the stress in your life. Stress can make you want to smoke. Try these things to help your stress: ? Getting regular exercise. ? Deep-breathing exercises. ? Yoga. ? Meditating. ? Doing a body scan. To do this, close your eyes, focus on one area of your body at a time from head to toe, and notice which parts of your body are tense. Try to relax the muscles in those  areas.  Download or buy apps on your mobile phone or tablet that can help you stick to your quit plan. There are many free apps, such as QuitGuide from the [employer] Office manager for Disease Control and Prevention). You can find more support from smokefree.gov and other websites. This information is not intended to replace advice given to you by your health care provider. Make sure you discuss any questions you have with your health care provider. Document Released: 08/29/2009 Document Revised: 06/30/2016 Document Reviewed: 03/19/2015 Elsevier Interactive Patient Education  2019 [place] Risks of Smoking Smoking cigarettes is very bad for your health. Tobacco smoke has over 200 known poisons in it. It contains the poisonous gases nitrogen oxide and carbon monoxide. There are over 60 chemicals in tobacco smoke that cause cancer. Smoking is difficult to quit because a chemical in tobacco, called nicotine, causes addiction or dependence. When you smoke and inhale, nicotine is absorbed rapidly into the bloodstream through your lungs. Both inhaled and non-inhaled nicotine may be addictive. What are the risks of cigarette smoke? Cigarette smokers have an increased risk of many serious medical problems, including:  Lung cancer.  Lung disease, such as pneumonia, bronchitis, and emphysema.  Chest pain (angina) and heart attack because the heart is not getting enough oxygen.  Heart disease and peripheral blood vessel disease.  High blood pressure (hypertension).  Stroke.  Oral cancer, including cancer of the lip, mouth, or voice box.  Bladder cancer.  Pancreatic cancer.  Cervical cancer.  Pregnancy complications, including premature birth.  Stillbirths and smaller newborn babies, birth defects, and genetic damage to sperm.  Early menopause.  Lower estrogen level for women.  Infertility.  Facial wrinkles.  Blindness.  Increased risk of broken bones (fractures).  Senile  dementia.  Stomach ulcers and internal bleeding.  Delayed wound healing and increased risk of complications during surgery.  Even smoking lightly shortens your life expectancy by several years. Because of secondhand smoke exposure, children of smokers have an increased risk of the following:  Sudden infant death syndrome (SIDS).  Respiratory infections.  Lung cancer.  Heart disease.  Ear infections. What are the benefits of quitting? There are many health benefits of quitting smoking. Here are some of them:  Within days of quitting smoking, your risk of having a heart attack decreases, your blood flow improves, and your lung capacity improves. Blood pressure, pulse rate, and breathing patterns start returning to normal soon after quitting.  Within months, your lungs may clear up completely.  Quitting for 10 years reduces your risk of developing lung cancer and heart disease to almost that of a nonsmoker.  People who quit may see an improvement in their overall quality of life. How do I quit smoking?     Smoking is an addiction with both physical and psychological effects, and longtime habits can be hard to change. Your health care provider can recommend:  Programs and community resources, which may include group support, education, or talk therapy.  Prescription medicines to help reduce cravings.  Nicotine replacement products, such as patches, gum, and nasal sprays. Use these products only as directed. Do not replace cigarette smoking with electronic cigarettes, which are commonly called e-cigarettes. The safety of e-cigarettes is not known, and some may contain harmful chemicals.  A combination of two or more of these methods. Where to find more information  American Lung Association: www.lung.org  American Cancer Society: www.cancer.org Summary  Smoking cigarettes is very bad for your health. Cigarette smokers have an increased risk of many serious medical problems,  including several cancers, heart disease, and stroke.  Smoking is an addiction with both physical and psychological effects, and longtime habits can be hard to change.  By stopping right away, you can greatly reduce the risk of medical problems for you and your family.  To help you quit smoking, your health care provider can recommend programs, community resources, prescription medicines, and nicotine replacement products such as patches, gum, and nasal sprays. This information is not intended to replace advice given to you by your health care provider. Make sure you discuss any questions you have with your health care provider. Document Released: 12/10/2004 Document Revised: 02/03/2018 Document Reviewed: 11/06/2016 Elsevier Interactive Patient Education  2019 [employer].

## 2019-01-17 NOTE — Progress Notes (Signed)
@Patient  ID: Valerie Gay, female    DOB: Nov 06, 1941, 78 y.o.   MRN: [id number]  Chief Complaint  Patient presents with  . Follow-up    Recent COPD exacerbation, follow-up office visit for COPD    Referring provider: Kathyrn Lass, MD  HPI:  78 year old female former smoker followed in our office for COPD  PMH: Hyperlipidemia, dysphasia, esophageal strictures, GERD Smoker/ Smoking History: Current smoker.  Currently smoking 2 cigarettes a day.  30-pack-year smoking history Maintenance: Symbicort 160, Incruse Pt of: Dr. Chase Caller  01/17/2019  - Visit   78 year old female current every day smoker followed in our office for COPD.  Patient was last seen in our office on 01/11/2019 and treated as a COPD exacerbation with Depo-Medrol, prednisone taper as well as doxycycline.  Patient reports that she feels somewhat better since last office visit but is still fatigued.  Patient is still currently smoking about 1 to 2 cigarettes a day.  Patient reports adherence to Symbicort 160 as well as Incruse Ellipta.  Patient reports she had use her rescue inhaler 3 times today.  Patient has been using a flutter valve as well as her incentive spirometer.  Patient reports she rarely has a productive cough but it is occasionally in the morning productive with thick white mucus.  MMRC - Breathlessness Score 4 - I am too breathless to leave the house or I am breathlessness when dressing    Tests:   12/13/2016-sputum culture-normal  12/08/2018-chest x-ray-no active disease  09/21/2016-CT chest without contrast-right upper lobe nodules unchanged from 05/15/2014 and is therefore considered benign, emphysema, aortic arthrosclerosis  05/07/2014-pulmonary function test- FVC 2 (71% predicted), postbronchodilator ratio 47, postbronchodilator FEV1 1.11 52% predicted, positive bronchodilator response  FENO:  No results found for: NITRICOXIDE  PFT: PFT Results Latest Ref Rng & Units 05/07/2014  FVC-Pre L 2.00    FVC-Predicted Pre % 71  FVC-Post L 2.39  FVC-Predicted Post % 84  Pre FEV1/FVC % % 50  Post FEV1/FCV % % 47  FEV1-Pre L 0.99  FEV1-Predicted Pre % 47  FEV1-Post L 1.11  TLC L 5.12  TLC % Predicted % 101  RV % Predicted % 138    Imaging: No results found.    Specialty Problems      Pulmonary Problems   COPD    Qualifier: Diagnosis of  By: Sherren Mocha RN, Dorian Pod        Acute on chronic respiratory failure ([place])   CAP (community acquired pneumonia)   COPD exacerbation (HCC)   COPD Gold II D, still smoking    09/21/2016-CT chest without contrast-right upper lobe nodules unchanged from 05/15/2014 and is therefore considered benign, emphysema, aortic arthrosclerosis  05/07/2014-pulmonary function test- FVC 2 (71% predicted), postbronchodilator ratio 47, postbronchodilator FEV1 1.11 52% predicted, positive bronchodilator response       Nodule of right lung   Lung nodule   DOE (dyspnea on exertion)   Acute URI   COPD with acute exacerbation (HCC)   Chronic respiratory failure with hypoxia (HCC)    Maintained on 2 L O2          Allergies  Allergen Reactions  . Spiriva Handihaler [Tiotropium Bromide Monohydrate] Hives, Itching and Rash     on upper body  . Banana Itching    Immunization History  Administered Date(s) Administered  . H1N1 11/05/2008  . Influenza Whole 07/29/2014  . Influenza, High Dose Seasonal PF 10/18/2018  . Influenza,inj,Quad PF,6+ Mos 09/27/2015, 12/01/2016  . Pneumococcal Polysaccharide-23 03/28/2014  Up-to-date  Past Medical History:  Diagnosis Date  . Anemia age 60  . Cellulitis and abscess of foot 03/2017   rt foot  . Colon polyps    2010   . COPD (chronic obstructive pulmonary disease) ([place])   . Diverticulosis   . History of hiatal hernia   . History of kidney stones   . Hyperlipidemia   . On home oxygen therapy     Tobacco History: Social History   Tobacco Use  Smoking Status Current Every Day Smoker  . Packs/day: 0.50   . Years: 60.00  . Pack years: 30.00  . Types: Cigarettes  Smokeless Tobacco Never Used  Tobacco Comment   01/17/19 down to 1-2 cigs per day   Ready to quit: Yes Counseling given: Yes Comment: 01/17/19 down to 1-2 cigs per day  Smoking assessment and cessation counseling  Patient currently smoking: 1-2 cigarettes  I have advised the patient to quit/stop smoking as soon as possible due to high risk for multiple medical problems.  It will also be very difficult for [place] to manage patient's  respiratory symptoms and status if we continue to expose her lungs to a known irritant.  We do not advise e-cigarettes as a form of stopping smoking.  Patient is willing to quit smoking. Has not set quit date.    I have advised the patient that we can assist and have options of nicotine replacement therapy, provided smoking cessation education today, provided smoking cessation counseling, and provided cessation resources. Chantix tried years ago. Doesn't like nicotine patch.  Patient is willing to try nicotine lozenges or Chantix.  We will proceed forward with nicotine lozenges patient is down to 1 to 2 cigarettes/day.  Follow-up next office visit office visit for assessment of smoking cessation.  Smoking cessation counseling advised for: 13 min    Outpatient Encounter Medications as of 01/17/2019  Medication Sig  . acetaminophen (TYLENOL) 325 MG tablet Take 2 tablets (650 mg total) by mouth every 6 (six) hours as needed for mild pain, moderate pain or fever (or Fever >/= 101).  [name] albuterol (PROVENTIL HFA;VENTOLIN HFA) 108 (90 Base) MCG/ACT inhaler Inhale 2 puffs into the lungs every 6 (six) hours as needed for wheezing or shortness of breath.  [name] albuterol (PROVENTIL) (2.5 MG/3ML) 0.083% nebulizer solution INHALE CONTENTS OF 1 VIAL IN NEBULIZER EVERY 8 HOURS IF NEEDED FOR WHEEZING.  [name] aspirin EC 81 MG tablet Take 81 mg by mouth daily.  . budesonide-formoterol (SYMBICORT) 160-4.5 MCG/ACT inhaler Inhale 2  puffs into the lungs 2 (two) times daily.  [name] co-enzyme Q-10 50 MG capsule Take 50 mg by mouth daily.  [name] doxycycline (VIBRA-TABS) 100 MG tablet Take 1 tablet (100 mg total) by mouth 2 (two) times daily.  . hydroxypropyl methylcellulose / hypromellose (ISOPTO TEARS / GONIOVISC) 2.5 % ophthalmic solution Place 2 drops into both eyes 3 (three) times daily as needed for dry eyes.  . INCRUSE ELLIPTA 62.5 MCG/INH AEPB USE 1 INHALATION DAILY (Patient taking differently: Inhale 1 puff into the lungs daily. )  . OXYGEN Inhale 3 L into the lungs continuous.   . predniSONE (DELTASONE) 10 MG tablet Take 4 tabs po daily x 3 days; then 3 tabs daily x3 days; then 2 tabs daily x3 days; then 1 tab daily x 3 days; then stop  . Triamcinolone Acetonide (NASACORT ALLERGY 24HR NA) Place 1 spray into both nostrils daily as needed (for allergies).   [name] UNKNOWN TO PATIENT Place into the right eye  See admin instructions. Eye injection (name unknown to patient): Inject into the right eye every 7-8 weeks for wet macular degeneration  . nicotine polacrilex (COMMIT) 4 MG lozenge Take 1 lozenge (4 mg total) by mouth as needed for smoking cessation.   No facility-administered encounter medications on file as of 01/17/2019.      Review of Systems  Review of Systems  Constitutional: Positive for fatigue.  HENT: Negative for congestion.   Respiratory: Positive for cough, shortness of breath and wheezing.   Cardiovascular: Negative for chest pain and palpitations.  Gastrointestinal: Negative for diarrhea, nausea and vomiting.     Physical Exam  BP 106/64 (BP Location: Right Arm, Patient Position: Sitting, Cuff Size: Normal)   Pulse (!) 112   Ht 5' 2.5" (1.588 m)   Wt 105 lb 9.6 oz (47.9 kg)   SpO2 96%   BMI 19.01 kg/m   Wt Readings from Last 5 Encounters:  01/17/19 105 lb 9.6 oz (47.9 kg)  01/11/19 107 lb (48.5 kg)  12/10/18 111 lb 8.8 oz (50.6 kg)  08/17/18 118 lb 3.2 oz (53.6 kg)  06/09/17 135 lb 6.4 oz (61.4 kg)    2 L via nasal cannula  Physical Exam  Constitutional: She is oriented to person, place, and time and well-developed, well-nourished, and in no distress. Vital signs are normal. No distress.  Frail elderly female   HENT:  Head: Normocephalic and atraumatic.  Right Ear: Hearing, external ear and ear canal normal.  Left Ear: Hearing, external ear and ear canal normal.  Nose: Right sinus exhibits no maxillary sinus tenderness and no frontal sinus tenderness. Left sinus exhibits no maxillary sinus tenderness and no frontal sinus tenderness.  Mouth/Throat: Uvula is midline and oropharynx is clear and moist. No oropharyngeal exudate.  Healing nasal mucosa bilaterally Unable to visualize TMs due to cerumen impaction bilaterally  Eyes: Pupils are equal, round, and reactive to light.  Neck: Normal range of motion. Neck supple.  Cardiovascular: Normal rate, regular rhythm and normal heart sounds.  Pulmonary/Chest: Effort normal and breath sounds normal. No accessory muscle usage. No respiratory distress. She has no decreased breath sounds. She has no wheezes. She has no rhonchi.  Diminished breath sounds throughout exam, no audible wheezing  Abdominal: Soft. Bowel sounds are normal. There is no abdominal tenderness.  Musculoskeletal: Normal range of motion.        General: Edema (Trace lower extremity edema in feet bilaterally) present.  Lymphadenopathy:    She has no cervical adenopathy.  Neurological: She is alert and oriented to person, place, and time. Gait normal.  Skin: Skin is warm and dry. She is not diaphoretic. No erythema.  Dry lower extremities flaking skin  Psychiatric: Mood, memory, affect and judgment normal.  Nursing note and vitals reviewed.     Lab Results:  CBC    Component Value Date/Time   WBC 8.6 12/09/2018 0525   RBC 3.79 (L) 12/09/2018 0525   HGB 11.7 (L) 12/09/2018 0525   HCT 37.4 12/09/2018 0525   PLT 419 (H) 12/09/2018 0525   MCV 98.7 12/09/2018 0525    MCH 30.9 12/09/2018 0525   MCHC 31.3 12/09/2018 0525   RDW 14.5 12/09/2018 0525   LYMPHSABS 0.4 (L) 12/09/2018 0525   MONOABS 0.1 12/09/2018 0525   EOSABS 0.0 12/09/2018 0525   BASOSABS 0.0 12/09/2018 0525    BMET    Component Value Date/Time   NA 139 12/09/2018 0525   K 4.0 12/10/2018 0334   CL 102 12/09/2018  0525   CO2 31 12/09/2018 0525   GLUCOSE 218 (H) 12/09/2018 0525   BUN 20 12/09/2018 0525   CREATININE 0.58 12/09/2018 0525   CALCIUM 8.8 (L) 12/09/2018 0525   GFRNONAA >60 12/09/2018 0525   GFRAA >60 12/09/2018 0525    BNP No results found for: BNP  ProBNP    Component Value Date/Time   PROBNP 50.0 09/26/2008 1205      Assessment & Plan:     COPD Gold II D, still smoking Assessment: 2015 pulmonary function test showing FEV1 of 1.1 152% predicted 2017 CT chest showing emphysema Recent COPD exacerbation in February/2020 mMRC 4 today Still currently smoking 1 to 2 cigarettes a day with ongoing nicotine cravings Maintained on Symbicort 160 as well as Incruse Ellipta Breath sounds are diminished throughout exam but no audible wheezing which is an improvement per last office note Patient still reporting fatigue  Plan: Walk today in office to see if we can qualify for POC Continue Symbicort 160 Continue Incruse Ellipta Emphasized the importance of the patient that she needs to set a quit date as well as to stop smoking Sent in prescription for 4 mg nicotine lozenge to help with curving nicotine cravings Continue flutter valve use Continue incentive spirometer Emphasized the importance the patient needs to improve physical activity and daily exercise We will have close follow-up with the patient in 2 to 4 weeks to ensure improvement of symptoms Patient needs increase physical activity Can consider prescribing vitamin D at next office visit Consider pulmonary rehab referral in the future, offered today patient declined Need to consider repeat pulmonary  function testing in the next couple of months as last PFTs were in 2015 and patient has continued to smoke  Chronic respiratory failure with hypoxia ([place]) Assessment: Currently maintained on 2 L via nasal cannula  Plan: Continue oxygen therapy as prescribed Walk today in office to see if she can qualify for a POC >>> Patient needed 4 L pulsed via POC to maintain oxygen saturations [facility] not have POC so patient may need to be considered to switch DME companies Follow-up with our office in 2 to 4 weeks  TOBACCO USE Assessment: Current every day smoker Smoking 2 cigarettes a day Patient is willing to stop smoking but has not set quit date 30-pack-year smoking history Patient is willing to try nicotine replacement lozenges  Plan: You need to set a quit date We recommend that you stop smoking Prescription for 4 mg nicotine lozenges sent to pharmacy to help with curving nicotine cravings and stopping smoking We will need to consider repeating pulmonary function testing in 2 to 3 months     Lauraine Rinne, NP 01/17/2019   This appointment was 45 min long with over 50% of the time in direct face-to-face patient care, assessment, plan of care, and follow-up.

## 2019-01-17 NOTE — Telephone Encounter (Signed)
01/17/2019 1308  Please contact the patient and send in the prednisone prescription below:   Prednisone 10mg  tablet  >>>Take 2 tablets (20 mg total) daily for the next 3 days, then resume taper from 01/11/2019 OV >>> Take with food in the morning  Please send in the 6 tablets.   Keep follow up with our office.   Wyn Quaker FNP

## 2019-01-18 DIAGNOSIS — J9621 Acute and chronic respiratory failure with hypoxia: Secondary | ICD-10-CM | POA: Diagnosis not present

## 2019-01-18 DIAGNOSIS — M6281 Muscle weakness (generalized): Secondary | ICD-10-CM | POA: Diagnosis not present

## 2019-01-18 DIAGNOSIS — Z9981 Dependence on supplemental oxygen: Secondary | ICD-10-CM | POA: Diagnosis not present

## 2019-01-18 DIAGNOSIS — F17210 Nicotine dependence, cigarettes, uncomplicated: Secondary | ICD-10-CM | POA: Diagnosis not present

## 2019-01-18 DIAGNOSIS — Z7951 Long term (current) use of inhaled steroids: Secondary | ICD-10-CM | POA: Diagnosis not present

## 2019-01-18 DIAGNOSIS — J441 Chronic obstructive pulmonary disease with (acute) exacerbation: Secondary | ICD-10-CM | POA: Diagnosis not present

## 2019-01-24 ENCOUNTER — Telehealth: Payer: Self-pay | Admitting: Internal Medicine

## 2019-01-24 DIAGNOSIS — M6281 Muscle weakness (generalized): Secondary | ICD-10-CM | POA: Diagnosis not present

## 2019-01-24 DIAGNOSIS — F17210 Nicotine dependence, cigarettes, uncomplicated: Secondary | ICD-10-CM | POA: Diagnosis not present

## 2019-01-24 DIAGNOSIS — J9621 Acute and chronic respiratory failure with hypoxia: Secondary | ICD-10-CM | POA: Diagnosis not present

## 2019-01-24 DIAGNOSIS — Z7951 Long term (current) use of inhaled steroids: Secondary | ICD-10-CM | POA: Diagnosis not present

## 2019-01-24 DIAGNOSIS — J441 Chronic obstructive pulmonary disease with (acute) exacerbation: Secondary | ICD-10-CM | POA: Diagnosis not present

## 2019-01-24 DIAGNOSIS — Z9981 Dependence on supplemental oxygen: Secondary | ICD-10-CM | POA: Diagnosis not present

## 2019-01-24 MED ORDER — BUDESONIDE-FORMOTEROL FUMARATE 160-4.5 MCG/ACT IN AERO: 2.0000 | INHALATION_SPRAY | Freq: Two times a day (BID) | RESPIRATORY_TRACT | 0 refills | Status: DC

## 2019-01-24 NOTE — Telephone Encounter (Signed)
Instructions      Return in about 4 weeks (around 02/14/2019), or if symptoms worsen or fail to improve, for Follow up with Wyn Quaker FNP-C.  Walk in office today to check for qualification POC  Continue Symbicort 160 >>> 2 puffs in the morning right when you wake up, rinse out your mouth after use, 12 hours later 2 puffs, rinse after use >>> Take this daily, no matter what >>> This is not a rescue inhaler       _________________  Hulen Skains and spoke with patient, refill has been sent in. Nothing further needed.

## 2019-01-30 ENCOUNTER — Ambulatory Visit: Payer: Medicare Other | Admitting: Pulmonary Disease

## 2019-01-30 ENCOUNTER — Telehealth: Payer: Self-pay | Admitting: Internal Medicine

## 2019-01-30 DIAGNOSIS — J9621 Acute and chronic respiratory failure with hypoxia: Secondary | ICD-10-CM | POA: Diagnosis not present

## 2019-01-30 DIAGNOSIS — M6281 Muscle weakness (generalized): Secondary | ICD-10-CM | POA: Diagnosis not present

## 2019-01-30 DIAGNOSIS — J441 Chronic obstructive pulmonary disease with (acute) exacerbation: Secondary | ICD-10-CM | POA: Diagnosis not present

## 2019-01-30 DIAGNOSIS — Z7951 Long term (current) use of inhaled steroids: Secondary | ICD-10-CM | POA: Diagnosis not present

## 2019-01-30 DIAGNOSIS — F17210 Nicotine dependence, cigarettes, uncomplicated: Secondary | ICD-10-CM | POA: Diagnosis not present

## 2019-01-30 DIAGNOSIS — Z9981 Dependence on supplemental oxygen: Secondary | ICD-10-CM | POA: Diagnosis not present

## 2019-01-30 MED ORDER — DOXYCYCLINE HYCLATE 100 MG PO TABS: 100.0000 mg | ORAL_TABLET | Freq: Two times a day (BID) | ORAL | 0 refills | Status: DC

## 2019-01-30 NOTE — Telephone Encounter (Signed)
I am sorry to hear the patient is not feeling well.  If the patient does develop a fever she will need to utilize an ED visit for further evaluation and triaging her she will need to contact her office back.  Can offer:  Doxycycline >>> 1 100 mg tablet every 12 hours for 7 days >>>take with food  >>>wear sunscreen   Please place the order  We will hold off on additional prednisone at this time.  Emphasized to the patient the importance of her stopping smoking.  As it makes it very difficult for [place] to manage her breathing when she continues to smoke even 1 to 2 cigarettes.  Please also review:  Coronavirus (COVID-19) Are you at risk?  Are you at risk for the Coronavirus (COVID-19)?  To be considered HIGH RISK for Coronavirus (COVID-19), you have to meet the following criteria:  . Traveled to [place], [place], [place], [place] or [place]; or in the [place] to [place], [place], [place], or [place]; and have fever, cough, and shortness of breath within the last 2 weeks of travel OR . Been in close contact with a person diagnosed with COVID-19 within the last 2 weeks and have fever, cough, and shortness of breath . IF YOU DO NOT MEET THESE CRITERIA, YOU ARE CONSIDERED LOW RISK FOR COVID-19.  What to do if you are HIGH RISK for COVID-19?  [name] If you are having a medical emergency, call 911. . Seek medical care right away. Before you go to a doctor's office, urgent care or emergency department, call ahead and tell them about your recent travel, contact with someone diagnosed with COVID-19, and your symptoms. You should receive instructions from your physician's office regarding next steps of care.  . When you arrive at healthcare provider, tell the healthcare staff immediately you have returned from visiting [place], [place], [place], [place] or [place]; or traveled in the [place] to [place], [place], [place], or [place]; in the last two weeks or you have been in close  contact with a person diagnosed with COVID-19 in the last 2 weeks.   . Tell the health care staff about your symptoms: fever, cough and shortness of breath. . After you have been seen by a medical provider, you will be either: o Tested for (COVID-19) and discharged home on quarantine except to seek medical care if symptoms worsen, and asked to  - Stay home and avoid contact with others until you get your results (4-5 days)  - Avoid travel on public transportation if possible (such as bus, train, or airplane) or o Sent to the Emergency Department by EMS for evaluation, COVID-19 testing, and possible admission depending on your condition and test results.  What to do if you are LOW RISK for COVID-19?  Reduce your risk of any infection by using the same precautions used for avoiding the common cold or flu:  [name] Wash your hands often with soap and warm water for at least 20 seconds.  If soap and water are not readily available, use an alcohol-based hand sanitizer with at least 60% alcohol.  . If coughing or sneezing, cover your mouth and nose by coughing or sneezing into the elbow areas of your shirt or coat, into a tissue or into your sleeve (not your hands). . Avoid shaking hands with others and consider head nods or verbal greetings only. . Avoid touching your eyes, nose, or mouth with unwashed hands.  . Avoid close contact  with people who are sick. . Avoid places or events with large numbers of people in one location, like concerts or sporting events. . Carefully consider travel plans you have or are making. . If you are planning any travel outside or inside the [place], visit the CDC's Travelers' Health webpage for the latest health notices. . If you have some symptoms but not all symptoms, continue to monitor at home and seek medical attention if your symptoms worsen. . If you are having a medical emergency, call 911.   [place] / e-Visit:  [email or web address]         [facility]: [facility]: [phone]                   [facility]: [place] FNP

## 2019-01-30 NOTE — Telephone Encounter (Signed)
Primary Pulmonologist: Dr. Casimer Bilis Last office visit and with whom: 01/17/2019 with Aaron Edelman What do we see them for (pulmonary problems): COPD Last OV assessment/plan:  Walk in office today to check for qualification POC  Continue Symbicort 160 >>> 2 puffs in the morning right when you wake up, rinse out your mouth after use, 12 hours later 2 puffs, rinse after use >>> Take this daily, no matter what >>> This is not a rescue inhaler   Continue Incruse Ellipta  >>> Take 1 puff daily  >>>Rinse your mouth out after use >>>This is a daily maintenance inhaler, NOT a rescue inhaler >>>Contact our office if you are having difficulties affording or obtaining this medication >>>It is important for you to be able to take this daily and not miss any doses  Only use your albuterol as a rescue medication to be used if you can't catch your breath by resting or doing a relaxed purse lip breathing pattern.  - The less you use it, the better it will work when you need it. - Ok to use up to 2 puffs every 4 hours if you must but call for immediate appointment if use goes up over your usual need - Don't leave home without it !! (think of it like the spare tire for your car)   Note your daily symptoms >remember "red flags" for COPD:  >>>Increase in cough >>>increase in sputum production >>>increase in shortness of breath or activity  intolerance.   If you notice these symptoms, please call the office to be seen.   Continue oxygen therapy as prescribed  >>>maintain oxygen saturations greater than 88 percent  >>>if unable to maintain oxygen saturations please contact the office  >>>do not smoke with oxygen  >>>can use nasal saline gel or nasal saline rinses to moisturize nose if oxygen causes dryness   We recommend that you stop smoking.  >>>You need to set a quit date >>>If you have friends or family who smoke, let them know you are trying to quit and not to smoke around you or in your  living environment  Smoking Cessation Resources:  1 800 QUIT NOW  >>> Patient to call this resource and utilize it to help support her quit smoking >>> Keep up your hard work with stopping smoking  You can also contact the [facility] >>>For smoking cessation classes call [phone]  We do not recommend using e-cigarettes as a form of stopping smoking  You can sign up for smoking cessation support texts and information:  >>>https://smokefree.gov/smokefreetxt  Nicotine lozenge: Lozenges are commonly uses short acting NRT product  >>>4mg  lozenge sent to pharmacy   Can use up to 1 lozenge every 1-2 hours for 6 weeks >>>Total amount of lozenges that can be used per day as 20 >>>Gradually reduce number of lozenges used per day after 2 weeks of use  Place lozenge in mouth and allowed to dissolve for 30 minutes loss and does not need to be chewed  Lozenges have advantages to be able to be used in people with TMG, poor dentition, dentures   Continue to use flutter valve twice daily next line >>> 10 breaths each time  Continue use of incentive spirometer hourly when awake   Continue to work to increase daily physical activity   Follow up in 2-4 weeks with Wyn Quaker FNP or Dr. Chase Caller   It is flu season:   >>> Best ways to protect herself from the flu: Receive the yearly flu vaccine,  practice good hand hygiene washing with soap and also using hand sanitizer when available, eat a nutritious meals, get adequate rest, hydrate appropriately   Please contact the office if your symptoms worsen or you have concerns that you are not improving.   Thank you for choosing [facility] for your healthcare, and for allowing [place] to partner with you on your healthcare journey. I am thankful to be able to provide care to you today.   Wyn Quaker FNP-C  Was appointment offered to patient (explain)?  No - due to pt's age and COVID-19  restrictions.   Reason for call:  Spoke with pt. States that she is not feeling well. Reports cough, shortness of breath and chest tightness. Cough is producing clear mucus. Denies wheezing, fever/chills/sweats. Symptoms started last week and have progressively gotten worse. Pt is taking her medications as prescribed. Her oxygen level has been staying the low 90's while on 3L/min. While speaking to the pt she was NOT in distress. Pt would rather not coming in for an appointment due to her age and not wanting to catch anything.  TP - please advise. Thanks!

## 2019-01-30 NOTE — Telephone Encounter (Signed)
Spoke with patient. She is aware of recs. Meds have been called in. Nothing further needed.

## 2019-02-08 ENCOUNTER — Telehealth (INDEPENDENT_AMBULATORY_CARE_PROVIDER_SITE_OTHER): Payer: Medicare Other | Admitting: Primary Care

## 2019-02-08 ENCOUNTER — Telehealth: Payer: Self-pay | Admitting: Internal Medicine

## 2019-02-08 ENCOUNTER — Other Ambulatory Visit: Payer: Self-pay | Admitting: Internal Medicine

## 2019-02-08 ENCOUNTER — Other Ambulatory Visit: Payer: Self-pay

## 2019-02-08 DIAGNOSIS — J449 Chronic obstructive pulmonary disease, unspecified: Secondary | ICD-10-CM | POA: Diagnosis not present

## 2019-02-08 DIAGNOSIS — Z7951 Long term (current) use of inhaled steroids: Secondary | ICD-10-CM | POA: Diagnosis not present

## 2019-02-08 DIAGNOSIS — Z9981 Dependence on supplemental oxygen: Secondary | ICD-10-CM | POA: Diagnosis not present

## 2019-02-08 DIAGNOSIS — F17210 Nicotine dependence, cigarettes, uncomplicated: Secondary | ICD-10-CM | POA: Diagnosis not present

## 2019-02-08 DIAGNOSIS — J441 Chronic obstructive pulmonary disease with (acute) exacerbation: Secondary | ICD-10-CM | POA: Diagnosis not present

## 2019-02-08 DIAGNOSIS — J9621 Acute and chronic respiratory failure with hypoxia: Secondary | ICD-10-CM | POA: Diagnosis not present

## 2019-02-08 DIAGNOSIS — M6281 Muscle weakness (generalized): Secondary | ICD-10-CM | POA: Diagnosis not present

## 2019-02-08 MED ORDER — MONTELUKAST SODIUM 10 MG PO TABS: 10.0000 mg | ORAL_TABLET | Freq: Every day | ORAL | 11 refills | Status: DC

## 2019-02-08 MED ORDER — PREDNISONE 10 MG PO TABS: ORAL_TABLET | ORAL | 0 refills | Status: DC

## 2019-02-08 MED ORDER — TRIAMCINOLONE ACETONIDE 55 MCG/ACT NA AERO: 2.0000 | INHALATION_SPRAY | Freq: Every day | NASAL | 12 refills | Status: DC

## 2019-02-08 NOTE — Telephone Encounter (Signed)
Valerie Gay is seeing pt and has taken all of her meds but is still having wheezing and is still having trouble laying down and can't breathe. Using the neublizer and still can't breathe - do we need a maintenance dose or do we need new orders to come see the pt - Valerie Gay is worried about coming in and out of the home and bringing something in to her. Risks vs. Benefits. CB# [phone]

## 2019-02-08 NOTE — Patient Instructions (Addendum)
Thank you for allowing me to speak with you today regarding your COPD symptoms  - Symptoms most consistent with mild flare of COPD likely d/t seasonal allergies - Symptoms not consistent with COVID-19 - Stay home as much as possible, avoid close contact with others, go to grocery store early in the morning during hours specially set aside for people over the age of 44, wash your hands frequently, stay well hydrated, monitor your temperature and report if >100  Observations: - Your oxygen level was 99% 3L today  - I could hear you clearing your throat frequently of mucus  - No overt wheezing heard during phone call  Recommendations: - Start Nasacort nasal spray, try using at bedtime - Start Singulair at bedtime  - Take prednisone taper as prescribed  - Continue Symbicort twice daily (breakfast and dinner) - Take Incurse once a day in AM  - Elevate your head of bed at time with a few pillows   RX: - Singulair 10mg  at bedtime - Nasacort nasal spray daily - Prednisone taper as prescribed   Follow-up: - Televist with Beth in 2-3 weeks  - Call back sooner or go to ED if breathing worsens     Chronic Obstructive Pulmonary Disease  Chronic obstructive pulmonary disease (COPD) is a long-term (chronic) condition that affects the lungs. COPD is a general term that can be used to describe many different lung problems that cause lung swelling (inflammation) and limit airflow, including chronic bronchitis and emphysema. If you have COPD, your lung function will probably never return to normal. In most cases, it gets worse over time. However, there are steps you can take to slow the progression of the disease and improve your quality of life. What are the causes? This condition may be caused by:  Smoking. This is the most common cause.  Certain genes passed down through families. What increases the risk? The following factors may make you more likely to develop this condition:  Secondhand  smoke from cigarettes, pipes, or cigars.  Exposure to chemicals and other irritants such as fumes and dust in the work environment.  Chronic lung conditions or infections. What are the signs or symptoms? Symptoms of this condition include:  Shortness of breath, especially during physical activity.  Chronic cough with a large amount of thick mucus. Sometimes the cough may not have any mucus (dry cough).  Wheezing.  Rapid breaths.  Gray or bluish discoloration (cyanosis) of the skin, especially in your fingers, toes, or lips.  Feeling tired (fatigue).  Weight loss.  Chest tightness.  Frequent infections.  Episodes when breathing symptoms become much worse (exacerbations).  Swelling in the ankles, feet, or legs. This may occur in later stages of the disease. How is this diagnosed? This condition is diagnosed based on:  Your medical history.  A physical exam. You may also have tests, including:  Lung (pulmonary) function tests. This may include a spirometry test, which measures your ability to exhale properly.  Chest X-ray.  CT scan.  Blood tests. How is this treated? This condition may be treated with:  Medicines. These may include inhaled rescue medicines to treat acute exacerbations as well as long-term, or maintenance, medicines to prevent flare-ups of COPD. ? Bronchodilators help treat COPD by dilating the airways to allow increased airflow and make your breathing more comfortable. ? Steroids can reduce airway inflammation and help prevent exacerbations.  Smoking cessation. If you smoke, your health care provider may ask you to quit, and may also recommend  therapy or replacement products to help you quit.  Pulmonary rehabilitation. This may involve working with a team of health care providers and specialists, such as respiratory, occupational, and physical therapists.  Exercise and physical activity. These are beneficial for nearly all people with  COPD.  Nutrition therapy to gain weight, if you are underweight.  Oxygen. Supplemental oxygen therapy is only helpful if you have a low oxygen level in your blood (hypoxemia).  Lung surgery or transplant.  Palliative care. This is to help people with COPD feel comfortable when treatment is no longer working. Follow these instructions at home: Medicines  Take over-the-counter and prescription medicines (inhaled or pills) only as told by your health care provider.  Talk to your health care provider before taking any cough or allergy medicines. You may need to avoid certain medicines that dry out your airways. Lifestyle  If you are a smoker, the most important thing that you can do is to stop smoking. Do not use any products that contain nicotine or tobacco, such as cigarettes and e-cigarettes. If you need help quitting, ask your health care provider. Continuing to smoke will cause the disease to progress faster.  Avoid exposure to things that irritate your lungs, such as smoke, chemicals, and fumes.  Stay active, but balance activity with periods of rest. Exercise and physical activity will help you maintain your ability to do things you want to do.  Learn and use relaxation techniques to manage stress and to control your breathing.  Get the right amount of sleep and get quality sleep. Most adults need 7 or more hours per night.  Eat healthy foods. Eating smaller, more frequent meals and resting before meals may help you maintain your strength. Controlled breathing Learn and use controlled breathing techniques as directed by your health care provider. Controlled breathing techniques include:  Pursed lip breathing. Start by breathing in (inhaling) through your nose for 1 second. Then, purse your lips as if you were going to whistle and breathe out (exhale) through the pursed lips for 2 seconds.  Diaphragmatic breathing. Start by putting one hand on your abdomen just above your waist.  Inhale slowly through your nose. The hand on your abdomen should move out. Then purse your lips and exhale slowly. You should be able to feel the hand on your abdomen moving in as you exhale. Controlled coughing Learn and use controlled coughing to clear mucus from your lungs. Controlled coughing is a series of short, progressive coughs. The steps of controlled coughing are: 1. Lean your head slightly forward. 2. Breathe in deeply using diaphragmatic breathing. 3. Try to hold your breath for 3 seconds. 4. Keep your mouth slightly open while coughing twice. 5. Spit any mucus out into a tissue. 6. Rest and repeat the steps once or twice as needed. General instructions  Make sure you receive all the vaccines that your health care provider recommends, especially the pneumococcal and influenza vaccines. Preventing infection and hospitalization is very important when you have COPD.  Use oxygen therapy and pulmonary rehabilitation if directed to by your health care provider. If you require home oxygen therapy, ask your health care provider whether you should purchase a pulse oximeter to measure your oxygen level at home.  Work with your health care provider to develop a COPD action plan. This will help you know what steps to take if your condition gets worse.  Keep other chronic health conditions under control as told by your health care provider.  Avoid extreme  temperature and humidity changes.  Avoid contact with people who have an illness that spreads from person to person (is contagious), such as viral infections or pneumonia.  Keep all follow-up visits as told by your health care provider. This is important. Contact a health care provider if:  You are coughing up more mucus than usual.  There is a change in the color or thickness of your mucus.  Your breathing is more labored than usual.  Your breathing is faster than usual.  You have difficulty sleeping.  You need to use your  rescue medicines or inhalers more often than expected.  You have trouble doing routine activities such as getting dressed or walking around the house. Get help right away if:  You have shortness of breath while you are resting.  You have shortness of breath that prevents you from: ? Being able to talk. ? Performing your usual physical activities.  You have chest pain lasting longer than 5 minutes.  Your skin color is more blue (cyanotic) than usual.  You measure low oxygen saturations for longer than 5 minutes with a pulse oximeter.  You have a fever.  You feel too tired to breathe normally. Summary  Chronic obstructive pulmonary disease (COPD) is a long-term (chronic) condition that affects the lungs.  Your lung function will probably never return to normal. In most cases, it gets worse over time. However, there are steps you can take to slow the progression of the disease and improve your quality of life.  Treatment for COPD may include taking medicines, quitting smoking, pulmonary rehabilitation, and changes to diet and exercise. As the disease progresses, you may need oxygen therapy, a lung transplant, or palliative care.  To help manage your condition, do not smoke, avoid exposure to things that irritate your lungs, stay up to date on all vaccines, and follow your health care provider's instructions for taking medicines. This information is not intended to replace advice given to you by your health care provider. Make sure you discuss any questions you have with your health care provider. Document Released: 08/12/2005 Document Revised: 04/28/2017 Document Reviewed: 12/07/2016 Elsevier Interactive Patient Education  2019 [place].    Allergies, Adult An allergy means that your body reacts to something that bothers it (allergen). It is not a normal reaction. This can happen from something that you:  Eat.  Breathe in.  Touch. You can have an allergy (be allergic)  to:  Outdoor things, like: ? Pollen. ? Grass. ? Weeds.  Indoor things, like: ? Dust. ? Smoke. ? Pet dander.  Foods.  Medicines.  Things that bother your skin, like: ? Detergents. ? Chemicals. ? Latex.  Perfume.  Bugs. An allergy cannot spread from person to person (is not contagious). Follow these instructions at home:         Stay away from things that you know you are allergic to.  If you have allergies to things in the air, wash out your nose each day. Do it with one of these: ? A salt-water (saline) spray. ? A container (neti pot).  Take over-the-counter and prescription medicines only as told by your doctor.  Keep all follow-up visits as told by your doctor. This is important.  If you are at risk for a very bad allergy reaction (anaphylaxis), keep an auto-injector with you all the time. This is called an epinephrine injection. ? This is pre-measured medicine with a needle. You can put it into your skin by yourself. ? Right after  you have a very bad allergy reaction, you or a person with you must give the medicine in less than a few minutes. This is an emergency.  If you have ever had a very bad allergy reaction, wear a medical alert bracelet or necklace. Your very bad allergy should be written on it. Contact a health care provider if:  Your symptoms do not get better with treatment. Get help right away if:  You have symptoms of a very bad allergy reaction. These include: ? A swollen mouth, tongue, or throat. ? Pain or tightness in your chest. ? Trouble breathing. ? Being short of breath. ? Dizziness. ? Fainting. ? Very bad pain in your belly (abdomen). ? Throwing up (vomiting). ? Watery poop (diarrhea). Summary  An allergy means that your body reacts to something that bothers it (allergen). It is not a normal reaction.  Stay away from things that make your body react.  Take over-the-counter and prescription medicines only as told by your  doctor.  If you are at risk for a very bad allergy reaction, carry an auto-injector (epinephrine injection) all the time. Also, wear a medical alert bracelet or necklace so people know about your allergy. This information is not intended to replace advice given to you by your health care provider. Make sure you discuss any questions you have with your health care provider. Document Released: 02/27/2013 Document Revised: 02/15/2017 Document Reviewed: 02/15/2017 Elsevier Interactive Patient Education  2019 [place] (COVID-19) Are you at risk?  Are you at risk for the Coronavirus (COVID-19)?  To be considered HIGH RISK for Coronavirus (COVID-19), you have to meet the following criteria:  . Traveled to [place], [place], [place], [place] or [place]; or in the [place] to [place], [place], [place], or [place]; and have fever, cough, and shortness of breath within the last 2 weeks of travel OR . Been in close contact with a person diagnosed with COVID-19 within the last 2 weeks and have fever, cough, and shortness of breath . IF YOU DO NOT MEET THESE CRITERIA, YOU ARE CONSIDERED LOW RISK FOR COVID-19.  What to do if you are HIGH RISK for COVID-19?  [name] If you are having a medical emergency, call 911. . Seek medical care right away. Before you go to a doctor's office, urgent care or emergency department, call ahead and tell them about your recent travel, contact with someone diagnosed with COVID-19, and your symptoms. You should receive instructions from your physician's office regarding next steps of care.  . When you arrive at healthcare provider, tell the healthcare staff immediately you have returned from visiting [place], [place], [place], [place] or [place]; or traveled in the [place] to [place], [place], [place], or [place]; in the last two weeks or you have been in close contact with a person diagnosed with COVID-19 in the last 2 weeks.   . Tell the  health care staff about your symptoms: fever, cough and shortness of breath. . After you have been seen by a medical provider, you will be either: o Tested for (COVID-19) and discharged home on quarantine except to seek medical care if symptoms worsen, and asked to  - Stay home and avoid contact with others until you get your results (4-5 days)  - Avoid travel on public transportation if possible (such as bus, train, or airplane) or o Sent to the Emergency Department by EMS for evaluation, COVID-19 testing, and possible admission depending on your  condition and test results.  What to do if you are LOW RISK for COVID-19?  Reduce your risk of any infection by using the same precautions used for avoiding the common cold or flu:  [name] Wash your hands often with soap and warm water for at least 20 seconds.  If soap and water are not readily available, use an alcohol-based hand sanitizer with at least 60% alcohol.  . If coughing or sneezing, cover your mouth and nose by coughing or sneezing into the elbow areas of your shirt or coat, into a tissue or into your sleeve (not your hands). . Avoid shaking hands with others and consider head nods or verbal greetings only. . Avoid touching your eyes, nose, or mouth with unwashed hands.  . Avoid close contact with people who are sick. . Avoid places or events with large numbers of people in one location, like concerts or sporting events. . Carefully consider travel plans you have or are making. . If you are planning any travel outside or inside the [place], visit the CDC's Travelers' Health webpage for the latest health notices. . If you have some symptoms but not all symptoms, continue to monitor at home and seek medical attention if your symptoms worsen. . If you are having a medical emergency, call 911.   [place] / e-Visit: [email or web address]         [facility]: [facility]: [phone]                   [facility]: [phone]

## 2019-02-08 NOTE — Telephone Encounter (Signed)
Spoke with Beth with [employer]. She is still currently in the pt's home. Reports that the pt is not improving since calling us a few weeks ago. Pt has been scheduled for a telehealth visit with Beth today at 1:30pm. Nothing further was needed.

## 2019-02-08 NOTE — Progress Notes (Signed)
Virtual Visit via Telephone Note  I connected with Valerie Gay on 02/08/19 at  1:30 PM EDT by telephone and verified that I am speaking with the correct person using two identifiers.   I discussed the limitations, risks, security and privacy concerns of performing an evaluation and management service by telephone and the availability of in person appointments. I also discussed with the patient that there may be a patient responsible charge related to this service. The patient expressed understanding and agreed to proceed.   History of Present Illness: 78 year old female, current smoker (30 pack year hx). PMH significant for COPD GOLD II, chronic respiratory failure, CAD, GERD, esophageal stricture, dysphagia. Maintained on Symbicort 160 and Incruse. Recently treated with doxycyline x 7 days.   Patient at home, agreeing to [place]. Myself and Glorianne Proctor present on phone call. My nurse Lattie Haw to set up follow up visit and mail AVS.   Patient complains of increased shortness of breath over the last few days. She was receiving home nursing services which ended today. Nurse noted wheezing on exam. Recently completed course of doxycyline and prior prednisone taper. States that her throat gets clogged with mucus. She has a little cough, reports clear mucus. Needs to take a sip of water while talking. Eating and drinking normally.Trouble getting comfortable at night d/t mucus. No fevers or travel.     Observations/Objective:  Observations: No labored breathing or overt wheezing heard while speaking with patient over the phone, she was frequently clearing her throat of mucus.   Vitals reported by patient: O2 99% 3L HR 113   Testing: 2015 pulmonary function test showing FEV1 of 1.1 1 (52%) predicted 2017 CT chest showing emphysema  Assessment and Plan:  Mild COPD exacerbation likely d/t serasonal allergies or PND - Start Nasacort nasal spray - Start Singulair at bedtime  - Take  prednisone taper as prescribed  - Continue Symbicort twice daily (breakfast and dinner) - Take Incurse once a day in AM  - Elevate head of bed at night with a few pillows  - May need to consider adding daliresp or low dose daily prednisone  - Consider pulmonary rehab at a late  - Continue to encourage smoking cessation   Follow Up Instructions:  - Televist with Beth in 2-3 weeks  - Call back sooner or go to ED if breathing worsens    I discussed the assessment and treatment plan with the patient. The patient was provided an opportunity to ask questions and all were answered. The patient agreed with the plan and demonstrated an understanding of the instructions.   The patient was advised to call back or seek an in-person evaluation if the symptoms worsen or if the condition fails to improve as anticipated.  I provided 30 minutes of non-face-to-face time during this encounter. Phone call 1:40-2:10pm.    Martyn Ehrich, NP

## 2019-02-09 ENCOUNTER — Telehealth: Payer: Self-pay | Admitting: Internal Medicine

## 2019-02-09 MED ORDER — ALBUTEROL SULFATE (2.5 MG/3ML) 0.083% IN NEBU: INHALATION_SOLUTION | RESPIRATORY_TRACT | 2 refills | Status: DC

## 2019-02-09 NOTE — Telephone Encounter (Signed)
Called and spoke with pt stating to her that the neb Rx was sent to pharmacy for her yesterday. Pt stated to me that the pharmacy could not fill the Rx and that is when I saw that the dx code was missing. Stated to pt that I would resend Rx to pharmacy with Dx code on it so they could fill med for her. Pt expressed understanding.  Rx has been resent with dx code to pt's pharmacy. Nothing further needed.

## 2019-02-20 ENCOUNTER — Other Ambulatory Visit: Payer: Self-pay | Admitting: Internal Medicine

## 2019-02-27 ENCOUNTER — Encounter: Payer: Self-pay | Admitting: Primary Care

## 2019-02-27 ENCOUNTER — Ambulatory Visit (INDEPENDENT_AMBULATORY_CARE_PROVIDER_SITE_OTHER): Payer: Medicare Other | Admitting: Primary Care

## 2019-02-27 ENCOUNTER — Other Ambulatory Visit: Payer: Self-pay

## 2019-02-27 DIAGNOSIS — J449 Chronic obstructive pulmonary disease, unspecified: Secondary | ICD-10-CM | POA: Diagnosis not present

## 2019-02-27 MED ORDER — ALBUTEROL SULFATE (2.5 MG/3ML) 0.083% IN NEBU: INHALATION_SOLUTION | RESPIRATORY_TRACT | 2 refills | Status: DC

## 2019-02-27 NOTE — Patient Instructions (Addendum)
COPD - Continue Symbicort 160 twice daily (breakfast and dinner) - Continue Incurse once a day in AM  - Continue Albuterol as needed every 6 hours for shortness of breath/wheezing  Serasonal allergies or PND - Continue Nasacort nasal spray - Continue Singulair at bedtime   RX: Larger quantity of Albuterol nebulizers sent to pharmacy    Follow Up:  2-4 months with Ramaswamy or sooner if needed

## 2019-02-27 NOTE — Progress Notes (Signed)
Virtual Visit via Telephone Note  I connected with Valerie Gay on 02/27/19 at 11:30 AM EDT by telephone and verified that I am speaking with the correct person using two identifiers.   I discussed the limitations, risks, security and privacy concerns of performing an evaluation and management service by telephone and the availability of in person appointments. I also discussed with the patient that there may be a patient responsible charge related to this service. The patient expressed understanding and agreed to proceed.  Patient at home, agreeing to [place]. Myself and Pepper Kerrick present on phone call. My nurse Lattie Haw to set up follow up visit and mail AVS.   History of Present Illness: 78 year old female, current smoker (30 pack year hx). PMH significant for COPD GOLD II, chronic respiratory failure, CAD, GERD, esophageal stricture, dysphagia. Maintained on Symbicort 160 and Incruse. Recently treated with doxycyline x 7 days.   Patient complains of increased shortness of breath over the last few days. She was receiving home nursing services which ended today. Nurse noted wheezing on exam. Recently completed course of doxycyline and prior prednisone taper. States that her throat gets clogged with mucus. She has a little cough, reports clear mucus. Needs to take a sip of water while talking. Eating and drinking normally.Trouble getting comfortable at night d/t mucus. No fevers or travel. Treated for mild COPD exacerbation d/t seasonal allergies. Started on Nasacort and Singulair. RX prednisone taper as prescribed. Continue Symbicort and Incruse.   02/27/2019  Called patient today for 2 week follow up E-vist. Patient is doing much better. On the last end of her prednisone taper. Feels much better. Breathing is baseline. She can not hear any wheezing. No cough or mucus production. Asking for larger quantity for nebulizer ampule's be sent to her pharmacy, stats that she was getting 3 boxes at a  time and now she only gets one.   Observations/Objective:  Observations: - NO shortness of breath, wheezing or cough   Testing: 2015 pulmonary function test showing FEV1 of 1.1 1 (52%) predicted 2017 CT chest showing emphysema  Assessment and Plan:  COPD: - Recent exacerbation in March tx with prednisone taper  - Continue Symbicort 160 twice daily and Incurse once a day in AM  - Continue Albuterol as needed every 6 hours for shortness of breath/wheezing - Consider Daliresp and/or pulmonary rehab at a late date if continues to have exacerbations    Serasonal allergies or PND - Continue Nasacort nasal spray - Continue Singulair at bedtime   Previous tobacco abuse: - Congratulated on her success - Patient quit smoking 3-4 weeks ago (March 2020)  Follow Up Instructions:   2-4 months with Ramaswamy or sooner if needed   I discussed the assessment and treatment plan with the patient. The patient was provided an opportunity to ask questions and all were answered. The patient agreed with the plan and demonstrated an understanding of the instructions.   The patient was advised to call back or seek an in-person evaluation if the symptoms worsen or if the condition fails to improve as anticipated.  I provided 20 minutes of non-face-to-face time during this encounter.   Martyn Ehrich, NP

## 2019-03-10 DIAGNOSIS — Z87891 Personal history of nicotine dependence: Secondary | ICD-10-CM | POA: Diagnosis not present

## 2019-03-10 DIAGNOSIS — J9611 Chronic respiratory failure with hypoxia: Secondary | ICD-10-CM | POA: Diagnosis not present

## 2019-03-10 DIAGNOSIS — J449 Chronic obstructive pulmonary disease, unspecified: Secondary | ICD-10-CM | POA: Diagnosis not present

## 2019-05-22 DIAGNOSIS — R309 Painful micturition, unspecified: Secondary | ICD-10-CM | POA: Diagnosis not present

## 2019-06-16 DIAGNOSIS — R600 Localized edema: Secondary | ICD-10-CM | POA: Diagnosis not present

## 2019-06-17 HISTORY — PX: TRANSTHORACIC ECHOCARDIOGRAM: SHX275

## 2019-06-19 ENCOUNTER — Telehealth: Payer: Self-pay | Admitting: Cardiology

## 2019-06-19 DIAGNOSIS — R600 Localized edema: Secondary | ICD-10-CM | POA: Diagnosis not present

## 2019-06-19 NOTE — Telephone Encounter (Signed)
Call returned to the patient. She stated that she has been having swelling on both of her feet for the past 5 days. She denies shortness of breath. She stated that the swelling does not go down overnight. She does not weigh her self at home.  Her PCP has prescribed her Furosemide 20 mg once daily for 5 days. She is currently on day three and has not seen any relief. Her PCP has advised that she be seen by a cardiologist.   The patient has not been seen in the office since 2018. An appointment has made for 06/23/2019 with Kerin Ransom, [place]. She has been advised to call her PCP back if the swelling worsens or she develops shortness of breath. She has verbalized her understanding.

## 2019-06-19 NOTE — Telephone Encounter (Signed)
New message   Pt c/o swelling: STAT is pt has developed SOB within 24 hours  1) How much weight have you gained and in what time span?no   2) If swelling, where is the swelling located? Both feet   3) Are you currently taking a fluid pill? Yes   4) Are you currently SOB?no   5) Do you have a log of your daily weights (if so, list)? No   6) Have you gained 3 pounds in a day or 5 pounds in a week? No   7) Have you traveled recently? No

## 2019-06-22 ENCOUNTER — Telehealth: Payer: Self-pay | Admitting: Cardiology

## 2019-06-22 NOTE — Telephone Encounter (Signed)
Called patient and advised of message from [place] okay to bring caregiver.  Patient verbalized understanding.

## 2019-06-22 NOTE — Telephone Encounter (Signed)
Yes OK to accompany patient back when she sees provider.  Kerin Ransom PA-C 06/22/2019 4:28 PM

## 2019-06-22 NOTE — Telephone Encounter (Signed)
Per patient, she has caregiver that should be with her during appointments.  Is this okay?  Will route to PA, and Primary.

## 2019-06-22 NOTE — Telephone Encounter (Signed)
New message   Patient states that she will need a caregiver to come to appt tomorrow she states that she is on oxygen and in a wheelchair. Please advise.

## 2019-06-23 ENCOUNTER — Encounter: Payer: Self-pay | Admitting: Cardiology

## 2019-06-23 ENCOUNTER — Ambulatory Visit (INDEPENDENT_AMBULATORY_CARE_PROVIDER_SITE_OTHER): Payer: Medicare Other | Admitting: Cardiology

## 2019-06-23 ENCOUNTER — Other Ambulatory Visit: Payer: Self-pay

## 2019-06-23 VITALS — BP 116/67 | HR 85 | Temp 98.1°F | Ht 62.5 in | Wt 122.6 lb

## 2019-06-23 DIAGNOSIS — E8770 Fluid overload, unspecified: Secondary | ICD-10-CM | POA: Diagnosis not present

## 2019-06-23 DIAGNOSIS — J9611 Chronic respiratory failure with hypoxia: Secondary | ICD-10-CM

## 2019-06-23 DIAGNOSIS — R609 Edema, unspecified: Secondary | ICD-10-CM | POA: Diagnosis not present

## 2019-06-23 DIAGNOSIS — I2510 Atherosclerotic heart disease of native coronary artery without angina pectoris: Secondary | ICD-10-CM | POA: Diagnosis not present

## 2019-06-23 MED ORDER — FUROSEMIDE 40 MG PO TABS: 40.0000 mg | ORAL_TABLET | Freq: Every day | ORAL | 3 refills | Status: DC

## 2019-06-23 NOTE — Assessment & Plan Note (Signed)
Bilateral LE (foot) edema- possible Rt heart failure

## 2019-06-23 NOTE — Assessment & Plan Note (Signed)
COPD- Maintained on 2 L O2

## 2019-06-23 NOTE — Patient Instructions (Signed)
Medication Instructions:  INCREASE Lasix to 40mg  take 1 tablet once a day If you need a refill on your cardiac medications before your next appointment, please call your pharmacy.   Lab work: Your physician recommends that you return for lab work in: TODAY-BMET, BNP If you have labs (blood work) drawn today and your tests are completely normal, you will receive your results only by:  [place] (if you have MyChart) OR  A paper copy in the mail If you have any lab test that is abnormal or we need to change your treatment, we will call you to review the results.  Testing/Procedures: Your physician has requested that you have an echocardiogram. Echocardiography is a painless test that uses sound waves to create images of your heart. It provides your doctor with information about the size and shape of your heart and how well your hearts chambers and valves are working. This procedure takes approximately one hour. There are no restrictions for this procedure. [place]  Follow-Up: At [facility], you and your health needs are our priority.  As part of our continuing mission to provide you with exceptional heart care, we have created designated Provider Care Teams.  These Care Teams include your primary Cardiologist (physician) and Advanced Practice Providers (APPs -  Physician Assistants and Nurse Practitioners) who all work together to provide you with the care you need, when you need it.   Your physician recommends that you schedule a follow-up appointment in: [place], PA-C  Any Other Special Instructions Will Be Listed Below (If Applicable).     Low-Sodium Eating Plan Sodium, which is an element that makes up salt, helps you maintain a healthy balance of fluids in your body. Too much sodium can increase your blood pressure and cause fluid and waste to be held in your body. Your health care provider or dietitian may recommend following this  plan if you have high blood pressure (hypertension), kidney disease, liver disease, or heart failure. Eating less sodium can help lower your blood pressure, reduce swelling, and protect your heart, liver, and kidneys. What are tips for following this plan? General guidelines  Most people on this plan should limit their sodium intake to 1,500-2,000 mg (milligrams) of sodium each day. Reading food labels   The Nutrition Facts label lists the amount of sodium in one serving of the food. If you eat more than one serving, you must multiply the listed amount of sodium by the number of servings.  Choose foods with less than 140 mg of sodium per serving.  Avoid foods with 300 mg of sodium or more per serving. Shopping  Look for lower-sodium products, often labeled as "low-sodium" or "no salt added."  Always check the sodium content even if foods are labeled as "unsalted" or "no salt added".  Buy fresh foods. ? Avoid canned foods and premade or frozen meals. ? Avoid canned, cured, or processed meats  Buy breads that have less than 80 mg of sodium per slice. Cooking  Eat more home-cooked food and less restaurant, buffet, and fast food.  Avoid adding salt when cooking. Use salt-free seasonings or herbs instead of table salt or sea salt. Check with your health care provider or pharmacist before using salt substitutes.  Cook with plant-based oils, such as canola, sunflower, or olive oil. Meal planning  When eating at a restaurant, ask that your food be prepared with less salt or no salt, if possible.  Avoid  foods that contain MSG (monosodium glutamate). MSG is sometimes added to [place] food, bouillon, and some canned foods. What foods are recommended? The items listed may not be a complete list. Talk with your dietitian about what dietary choices are best for you. Grains Low-sodium cereals, including oats, puffed wheat and rice, and shredded wheat. Low-sodium crackers. Unsalted rice.  Unsalted pasta. Low-sodium bread. Whole-grain breads and whole-grain pasta. Vegetables Fresh or frozen vegetables. "No salt added" canned vegetables. "No salt added" tomato sauce and paste. Low-sodium or reduced-sodium tomato and vegetable juice. Fruits Fresh, frozen, or canned fruit. Fruit juice. Meats and other protein foods Fresh or frozen (no salt added) meat, poultry, seafood, and fish. Low-sodium canned tuna and salmon. Unsalted nuts. Dried peas, beans, and lentils without added salt. Unsalted canned beans. Eggs. Unsalted nut butters. Dairy Milk. Soy milk. Cheese that is naturally low in sodium, such as ricotta cheese, fresh mozzarella, or Swiss cheese Low-sodium or reduced-sodium cheese. Cream cheese. Yogurt. Fats and oils Unsalted butter. Unsalted margarine with no trans fat. Vegetable oils such as canola or olive oils. Seasonings and other foods Fresh and dried herbs and spices. Salt-free seasonings. Low-sodium mustard and ketchup. Sodium-free salad dressing. Sodium-free light mayonnaise. Fresh or refrigerated horseradish. Lemon juice. Vinegar. Homemade, reduced-sodium, or low-sodium soups. Unsalted popcorn and pretzels. Low-salt or salt-free chips. What foods are not recommended? The items listed may not be a complete list. Talk with your dietitian about what dietary choices are best for you. Grains Instant hot cereals. Bread stuffing, pancake, and biscuit mixes. Croutons. Seasoned rice or pasta mixes. Noodle soup cups. Boxed or frozen macaroni and cheese. Regular salted crackers. Self-rising flour. Vegetables Sauerkraut, pickled vegetables, and relishes. Olives. [place] fries. Onion rings. Regular canned vegetables (not low-sodium or reduced-sodium). Regular canned tomato sauce and paste (not low-sodium or reduced-sodium). Regular tomato and vegetable juice (not low-sodium or reduced-sodium). Frozen vegetables in sauces. Meats and other protein foods Meat or fish that is salted, canned,  smoked, spiced, or pickled. Bacon, ham, sausage, hotdogs, corned beef, chipped beef, packaged lunch meats, salt pork, jerky, pickled herring, anchovies, regular canned tuna, sardines, salted nuts. Dairy Processed cheese and cheese spreads. Cheese curds. Blue cheese. Feta cheese. String cheese. Regular cottage cheese. Buttermilk. Canned milk. Fats and oils Salted butter. Regular margarine. Ghee. Bacon fat. Seasonings and other foods Onion salt, garlic salt, seasoned salt, table salt, and sea salt. Canned and packaged gravies. Worcestershire sauce. Tartar sauce. Barbecue sauce. Teriyaki sauce. Soy sauce, including reduced-sodium. Steak sauce. Fish sauce. Oyster sauce. Cocktail sauce. Horseradish that you find on the shelf. Regular ketchup and mustard. Meat flavorings and tenderizers. Bouillon cubes. Hot sauce and Tabasco sauce. Premade or packaged marinades. Premade or packaged taco seasonings. Relishes. Regular salad dressings. Salsa. Potato and tortilla chips. Corn chips and puffs. Salted popcorn and pretzels. Canned or dried soups. Pizza. Frozen entrees and pot pies. Summary  Eating less sodium can help lower your blood pressure, reduce swelling, and protect your heart, liver, and kidneys.  Most people on this plan should limit their sodium intake to 1,500-2,000 mg (milligrams) of sodium each day.  Canned, boxed, and frozen foods are high in sodium. Restaurant foods, fast foods, and pizza are also very high in sodium. You also get sodium by adding salt to food.  Try to cook at home, eat more fresh fruits and vegetables, and eat less fast food, canned, processed, or prepared foods. This information is not intended to replace advice given to you by your health care provider. Make  sure you discuss any questions you have with your health care provider. Document Released: 04/24/2002 Document Revised: 10/15/2017 Document Reviewed: 10/26/2016 Elsevier Patient Education  2020 [employer].

## 2019-06-23 NOTE — Assessment & Plan Note (Signed)
Incidental finding of coronary Ca++ on CT Nov 2017 Myoview low risk Dec 2017

## 2019-06-23 NOTE — Progress Notes (Signed)
Cardiology Office Note:    Date:  06/23/2019   ID:  Valerie Gay, DOB Feb 10, 1941, MRN [id number]  PCP:  Kathyrn Lass, MD  Cardiologist:  Glenetta Hew, MD  Electrophysiologist:  None   Referring MD: Kathyrn Lass, MD   CC:  LE edema  History of Present Illness:    Valerie Gay is a 78 y.o. female with a hx of COPD on oxygen who is here in the office with complaints of new lower extremity edema for the last few days.  Patient saw Dr. Ellyn Hack in the past for an incidental finding of coronary artery calcification on CT scan.  Myoview in 2017 was low risk with normal LV function and medical therapy was recommended.  Patient came in today accompanied by her daughter.  The patient called her PCP and was told to increase her furosemide to 20 mg twice daily.  Patient says she has not really noted any difference in her lower extremity edema.  She denies any increased dyspnea on exertion orthopnea.  Past Medical History:  Diagnosis Date  . Anemia age 74  . Cellulitis and abscess of foot 03/2017   rt foot  . Colon polyps    2010   . COPD (chronic obstructive pulmonary disease) ([place])   . Diverticulosis   . History of hiatal hernia   . History of kidney stones   . Hyperlipidemia   . On home oxygen therapy     Past Surgical History:  Procedure Laterality Date  . BALLOON DILATION N/A 03/08/2017   Procedure: BALLOON DILATION;  Surgeon: Manus Gunning, MD;  Location: [facility];  Service: Gastroenterology;  Laterality: N/A;  . CATARACT EXTRACTION Right    August 15, 2013  . CHOLECYSTECTOMY N/A 06/22/2014   Procedure: LAPAROSCOPIC CHOLECYSTECTOMY WITH INTRAOPERATIVE CHOLANGIOGRAM;  Surgeon: Gayland Curry, MD;  Location: [facility];  Service: General;  Laterality: N/A;  . COLONOSCOPY N/A 02/19/2017   Procedure: COLONOSCOPY;  Surgeon: Manus Gunning, MD;  Location: [facility];  Service: Gastroenterology;  Laterality: N/A;  . ESOPHAGOGASTRODUODENOSCOPY N/A 02/19/2017   Procedure: ESOPHAGOGASTRODUODENOSCOPY (EGD);  Surgeon: Manus Gunning, MD;  Location: [facility];  Service: Gastroenterology;  Laterality: N/A;  . ESOPHAGOGASTRODUODENOSCOPY N/A 03/08/2017   Procedure: ESOPHAGOGASTRODUODENOSCOPY (EGD);  Surgeon: Manus Gunning, MD;  Location: [facility];  Service: Gastroenterology;  Laterality: N/A;  . ESOPHAGOGASTRODUODENOSCOPY N/A 04/06/2017   Procedure: ESOPHAGOGASTRODUODENOSCOPY (EGD);  Surgeon: Manus Gunning, MD;  Location: [facility];  Service: Gastroenterology;  Laterality: N/A;  . ESOPHAGOGASTRODUODENOSCOPY (EGD) WITH PROPOFOL N/A 04/27/2017   Procedure: ESOPHAGOGASTRODUODENOSCOPY (EGD) WITH PROPOFOL;  Surgeon: Manus Gunning, MD;  Location: [facility];  Service: Gastroenterology;  Laterality: N/A;  . NM MYOVIEW LTD  10/2016    EF 55-65% with normal LV function. LOW RISK. No ischemia or infarction.  . TONSILLECTOMY      Current Medications: Current Meds  Medication Sig  . acetaminophen (TYLENOL) 325 MG tablet Take 2 tablets (650 mg total) by mouth every 6 (six) hours as needed for mild pain, moderate pain or fever (or Fever >/= 101).  [name] albuterol (PROVENTIL HFA;VENTOLIN HFA) 108 (90 Base) MCG/ACT inhaler Inhale 2 puffs into the lungs every 6 (six) hours as needed for wheezing or shortness of breath.  [name] albuterol (PROVENTIL) (2.5 MG/3ML) 0.083% nebulizer solution USE 1 VIAL VIA NEBULIZER EVERY 8 HOURS AS NEEDED FOR WHEEZING  . aspirin EC 81 MG tablet Take 81 mg by mouth daily.  [name] co-enzyme Q-10 50 MG capsule Take 50 mg  by mouth daily.  . hydroxypropyl methylcellulose / hypromellose (ISOPTO TEARS / GONIOVISC) 2.5 % ophthalmic solution Place 2 drops into both eyes 3 (three) times daily as needed for dry eyes.  . INCRUSE ELLIPTA 62.5 MCG/INH AEPB USE 1 INHALATION DAILY (Patient taking differently: Inhale 1 puff into the lungs daily. )  . montelukast (SINGULAIR) 10 MG tablet Take 1 tablet (10 mg total) by mouth at  bedtime.  . nicotine polacrilex (COMMIT) 4 MG lozenge Take 1 lozenge (4 mg total) by mouth as needed for smoking cessation.  . OXYGEN Inhale 3 L into the lungs continuous.   . predniSONE (DELTASONE) 10 MG tablet Take 2 tabs daily x 7 days; 1 tab daily x 7 days; 1/2 tab daily x 7 days; stop  . SYMBICORT 160-4.5 MCG/ACT inhaler INHALE 2 PUFFS INTO THE LUNGS TWICE DAILY  . triamcinolone (NASACORT) 55 MCG/ACT AERO nasal inhaler Place 2 sprays into the nose daily.  [name] UNKNOWN TO PATIENT Place into the right eye See admin instructions. Eye injection (name unknown to patient): Inject into the right eye every 7-8 weeks for wet macular degeneration  . [DISCONTINUED] furosemide (LASIX) 20 MG tablet Take 1 tablet by mouth 2 (two) times daily.     Allergies:   Spiriva handihaler [tiotropium bromide monohydrate] and Banana   Social History   Socioeconomic History  . Marital status: Divorced    Spouse name: Not on file  . Number of children: Not on file  . Years of education: Not on file  . Highest education level: Not on file  Occupational History  . Occupation: retired  Scientific laboratory technician  . Financial resource strain: Not on file  . Food insecurity    Worry: Not on file    Inability: Not on file  . Transportation needs    Medical: Not on file    Non-medical: Not on file  Tobacco Use  . Smoking status: Former Smoker    Packs/day: 0.50    Years: 60.00    Pack years: 30.00    Types: Cigarettes    Quit date: 02/06/2019    Years since quitting: 0.3  . Smokeless tobacco: Never Used  . Tobacco comment: 01/17/19 down to 1-2 cigs per day  Substance and Sexual Activity  . Alcohol use: No    Alcohol/week: 0.0 standard drinks  . Drug use: No  . Sexual activity: Never  Lifestyle  . Physical activity    Days per week: Not on file    Minutes per session: Not on file  . Stress: Not on file  Relationships  . Social Herbalist on phone: Not on file    Gets together: Not on file    Attends  religious service: Not on file    Active member of club or organization: Not on file    Attends meetings of clubs or organizations: Not on file    Relationship status: Not on file  Other Topics Concern  . Not on file  Social History Narrative  . Not on file     Family History: The patient's family history includes Alzheimer's disease in her mother; Cancer in her father; Diabetes in her mother; Stomach cancer in her paternal grandmother. There is no history of Colon cancer or Esophageal cancer.  ROS:   Please see the history of present illness.     All other systems reviewed and are negative.  EKGs/Labs/Other Studies Reviewed:    The following studies were reviewed today: Myoview 2017  EKG:  EKG is ordered today.  The ekg ordered today demonstrates NSR, HR 85, NSST changes  Recent Labs: 12/09/2018: BUN 20; Creatinine, Ser 0.58; Hemoglobin 11.7; Platelets 419; Sodium 139 12/10/2018: Potassium 4.0  Recent Lipid Panel    Component Value Date/Time   CHOL 177 10/27/2016 1117   TRIG 91 10/27/2016 1117   HDL 40 (L) 10/27/2016 1117   CHOLHDL 4.4 10/27/2016 1117   VLDL 18 10/27/2016 1117   LDLCALC 119 (H) 10/27/2016 1117    Physical Exam:    VS:  BP 116/67   Pulse 85   Temp 98.1 F (36.7 C)   Ht 5' 2.5" (1.588 m)   Wt 122 lb 9.6 oz (55.6 kg)   SpO2 100%   BMI 22.07 kg/m     Wt Readings from Last 3 Encounters:  06/23/19 122 lb 9.6 oz (55.6 kg)  01/17/19 105 lb 9.6 oz (47.9 kg)  01/11/19 107 lb (48.5 kg)     GEN: Elderly, frail female, on O2, in no acute distress HEENT: Normal NECK: No JVD; No carotid bruits LYMPHATICS: No lymphadenopathy CARDIAC: RRR, no murmurs, rubs, gallops RESPIRATORY:  Clear to auscultation without rales, wheezing or rhonchi  ABDOMEN: Soft, non-tender, non-distended MUSCULOSKELETAL: 2+ puffy edema in both feet, no pre tibial edema No deformity  SKIN: Warm and dry NEUROLOGIC:  Alert and oriented x 3 PSYCHIATRIC:  Normal affect   ASSESSMENT:     Edema Bilateral LE (foot) edema- possible Rt heart failure  Chronic respiratory failure with hypoxia (HCC) COPD- Maintained on 2 L O2  CAD (coronary artery disease) Incidental finding of coronary Ca++ on CT Nov 2017 Myoview low risk Dec 2017  PLAN:    I suggested she take Lasix 40 mg once daily instead of Lasix 20 mg BID. I suggested she wear support socks.   I instructed her to avoid salt (she had been eating chips).  I ordered an echo, BMP, and BNP.  F/U in a few weeks.    Medication Adjustments/Labs and Tests Ordered: Current medicines are reviewed at length with the patient today.  Concerns regarding medicines are outlined above.  Orders Placed This Encounter  Procedures  . Basic Metabolic Panel (BMET)  . Pro b natriuretic peptide (BNP)[facility])  . EKG 12-Lead  . ECHOCARDIOGRAM COMPLETE   Meds ordered this encounter  Medications  . furosemide (LASIX) 40 MG tablet    Sig: Take 1 tablet (40 mg total) by mouth daily.    Dispense:  90 tablet    Refill:  3    Patient Instructions  Medication Instructions:  INCREASE Lasix to 40mg  take 1 tablet once a day If you need a refill on your cardiac medications before your next appointment, please call your pharmacy.   Lab work: Your physician recommends that you return for lab work in: TODAY-BMET, BNP If you have labs (blood work) drawn today and your tests are completely normal, you will receive your results only by: [name] MyChart Message (if you have MyChart) OR . A paper copy in the mail If you have any lab test that is abnormal or we need to change your treatment, we will call you to review the results.  Testing/Procedures: Your physician has requested that you have an echocardiogram. Echocardiography is a painless test that uses sound waves to create images of your heart. It provides your doctor with information about the size and shape of your heart and how well your heart's chambers and valves are  working. This  procedure takes approximately one hour. There are no restrictions for this procedure. [place]  Follow-Up: At [facility], you and your health needs are our priority.  As part of our continuing mission to provide you with exceptional heart care, we have created designated Provider Care Teams.  These Care Teams include your primary Cardiologist (physician) and Advanced Practice Providers (APPs -  Physician Assistants and Nurse Practitioners) who all work together to provide you with the care you need, when you need it.  . Your physician recommends that you schedule a follow-up appointment in: [place], PA-C  Any Other Special Instructions Will Be Listed Below (If Applicable).     Low-Sodium Eating Plan Sodium, which is an element that makes up salt, helps you maintain a healthy balance of fluids in your body. Too much sodium can increase your blood pressure and cause fluid and waste to be held in your body. Your health care provider or dietitian may recommend following this plan if you have high blood pressure (hypertension), kidney disease, liver disease, or heart failure. Eating less sodium can help lower your blood pressure, reduce swelling, and protect your heart, liver, and kidneys. What are tips for following this plan? General guidelines  Most people on this plan should limit their sodium intake to 1,500-2,000 mg (milligrams) of sodium each day. Reading food labels   The Nutrition Facts label lists the amount of sodium in one serving of the food. If you eat more than one serving, you must multiply the listed amount of sodium by the number of servings.  Choose foods with less than 140 mg of sodium per serving.  Avoid foods with 300 mg of sodium or more per serving. Shopping  Look for lower-sodium products, often labeled as "low-sodium" or "no salt added."  Always check the sodium content even if foods are labeled as "unsalted"  or "no salt added".  Buy fresh foods. ? Avoid canned foods and premade or frozen meals. ? Avoid canned, cured, or processed meats  Buy breads that have less than 80 mg of sodium per slice. Cooking  Eat more home-cooked food and less restaurant, buffet, and fast food.  Avoid adding salt when cooking. Use salt-free seasonings or herbs instead of table salt or sea salt. Check with your health care provider or pharmacist before using salt substitutes.  Cook with plant-based oils, such as canola, sunflower, or olive oil. Meal planning  When eating at a restaurant, ask that your food be prepared with less salt or no salt, if possible.  Avoid foods that contain MSG (monosodium glutamate). MSG is sometimes added to [place] food, bouillon, and some canned foods. What foods are recommended? The items listed may not be a complete list. Talk with your dietitian about what dietary choices are best for you. Grains Low-sodium cereals, including oats, puffed wheat and rice, and shredded wheat. Low-sodium crackers. Unsalted rice. Unsalted pasta. Low-sodium bread. Whole-grain breads and whole-grain pasta. Vegetables Fresh or frozen vegetables. "No salt added" canned vegetables. "No salt added" tomato sauce and paste. Low-sodium or reduced-sodium tomato and vegetable juice. Fruits Fresh, frozen, or canned fruit. Fruit juice. Meats and other protein foods Fresh or frozen (no salt added) meat, poultry, seafood, and fish. Low-sodium canned tuna and salmon. Unsalted nuts. Dried peas, beans, and lentils without added salt. Unsalted canned beans. Eggs. Unsalted nut butters. Dairy Milk. Soy milk. Cheese that is naturally low in sodium, such as ricotta cheese, fresh mozzarella, or Swiss  cheese Low-sodium or reduced-sodium cheese. Cream cheese. Yogurt. Fats and oils Unsalted butter. Unsalted margarine with no trans fat. Vegetable oils such as canola or olive oils. Seasonings and other foods Fresh and dried  herbs and spices. Salt-free seasonings. Low-sodium mustard and ketchup. Sodium-free salad dressing. Sodium-free light mayonnaise. Fresh or refrigerated horseradish. Lemon juice. Vinegar. Homemade, reduced-sodium, or low-sodium soups. Unsalted popcorn and pretzels. Low-salt or salt-free chips. What foods are not recommended? The items listed may not be a complete list. Talk with your dietitian about what dietary choices are best for you. Grains Instant hot cereals. Bread stuffing, pancake, and biscuit mixes. Croutons. Seasoned rice or pasta mixes. Noodle soup cups. Boxed or frozen macaroni and cheese. Regular salted crackers. Self-rising flour. Vegetables Sauerkraut, pickled vegetables, and relishes. Olives. [place] fries. Onion rings. Regular canned vegetables (not low-sodium or reduced-sodium). Regular canned tomato sauce and paste (not low-sodium or reduced-sodium). Regular tomato and vegetable juice (not low-sodium or reduced-sodium). Frozen vegetables in sauces. Meats and other protein foods Meat or fish that is salted, canned, smoked, spiced, or pickled. Bacon, ham, sausage, hotdogs, corned beef, chipped beef, packaged lunch meats, salt pork, jerky, pickled herring, anchovies, regular canned tuna, sardines, salted nuts. Dairy Processed cheese and cheese spreads. Cheese curds. Blue cheese. Feta cheese. String cheese. Regular cottage cheese. Buttermilk. Canned milk. Fats and oils Salted butter. Regular margarine. Ghee. Bacon fat. Seasonings and other foods Onion salt, garlic salt, seasoned salt, table salt, and sea salt. Canned and packaged gravies. Worcestershire sauce. Tartar sauce. Barbecue sauce. Teriyaki sauce. Soy sauce, including reduced-sodium. Steak sauce. Fish sauce. Oyster sauce. Cocktail sauce. Horseradish that you find on the shelf. Regular ketchup and mustard. Meat flavorings and tenderizers. Bouillon cubes. Hot sauce and Tabasco sauce. Premade or packaged marinades. Premade or packaged  taco seasonings. Relishes. Regular salad dressings. Salsa. Potato and tortilla chips. Corn chips and puffs. Salted popcorn and pretzels. Canned or dried soups. Pizza. Frozen entrees and pot pies. Summary  Eating less sodium can help lower your blood pressure, reduce swelling, and protect your heart, liver, and kidneys.  Most people on this plan should limit their sodium intake to 1,500-2,000 mg (milligrams) of sodium each day.  Canned, boxed, and frozen foods are high in sodium. Restaurant foods, fast foods, and pizza are also very high in sodium. You also get sodium by adding salt to food.  Try to cook at home, eat more fresh fruits and vegetables, and eat less fast food, canned, processed, or prepared foods. This information is not intended to replace advice given to you by your health care provider. Make sure you discuss any questions you have with your health care provider. Document Released: 04/24/2002 Document Revised: 10/15/2017 Document Reviewed: 10/26/2016 Elsevier Patient Education  2020 Andrew, Kerin Ransom, [place]  06/23/2019 12:05 PM    [facility]

## 2019-06-24 LAB — BASIC METABOLIC PANEL
BUN/Creatinine Ratio: 25 (ref 12–28)
BUN: 17 mg/dL (ref 8–27)
CO2: 29 mmol/L (ref 20–29)
Calcium: 9.5 mg/dL (ref 8.7–10.3)
Chloride: 99 mmol/L (ref 96–106)
Creatinine, Ser: 0.69 mg/dL (ref 0.57–1.00)
GFR calc Af Amer: 95 mL/min/{1.73_m2} (ref >59)
GFR calc non Af Amer: 82 mL/min/{1.73_m2} (ref >59)
Glucose: 87 mg/dL (ref 65–99)
Potassium: 3.7 mmol/L (ref 3.5–5.2)
Sodium: 143 mmol/L (ref 134–144)

## 2019-06-24 LAB — PRO B NATRIURETIC PEPTIDE: NT-Pro BNP: 167 pg/mL (ref 0–738)

## 2019-06-29 ENCOUNTER — Other Ambulatory Visit: Payer: Self-pay

## 2019-06-29 ENCOUNTER — Ambulatory Visit (HOSPITAL_COMMUNITY): Payer: Medicare Other | Attending: Cardiovascular Disease

## 2019-06-29 DIAGNOSIS — R609 Edema, unspecified: Secondary | ICD-10-CM | POA: Diagnosis not present

## 2019-06-29 DIAGNOSIS — E8770 Fluid overload, unspecified: Secondary | ICD-10-CM | POA: Diagnosis not present

## 2019-07-19 ENCOUNTER — Ambulatory Visit: Payer: Commercial Indemnity | Admitting: Cardiology

## 2019-07-25 ENCOUNTER — Telehealth: Payer: Self-pay

## 2019-07-25 ENCOUNTER — Encounter: Payer: Self-pay | Admitting: Cardiology

## 2019-07-25 ENCOUNTER — Telehealth (INDEPENDENT_AMBULATORY_CARE_PROVIDER_SITE_OTHER): Payer: Medicare Other | Admitting: Cardiology

## 2019-07-25 VITALS — Ht 62.5 in | Wt 122.0 lb

## 2019-07-25 DIAGNOSIS — J9611 Chronic respiratory failure with hypoxia: Secondary | ICD-10-CM

## 2019-07-25 DIAGNOSIS — I2510 Atherosclerotic heart disease of native coronary artery without angina pectoris: Secondary | ICD-10-CM

## 2019-07-25 DIAGNOSIS — R609 Edema, unspecified: Secondary | ICD-10-CM | POA: Diagnosis not present

## 2019-07-25 DIAGNOSIS — E785 Hyperlipidemia, unspecified: Secondary | ICD-10-CM

## 2019-07-25 NOTE — Progress Notes (Signed)
Virtual Visit via Telephone Note   This visit type was conducted due to national recommendations for restrictions regarding the COVID-19 Pandemic (e.g. social distancing) in an effort to limit this patient's exposure and mitigate transmission in our community.  Due to her co-morbid illnesses, this patient is at least at moderate risk for complications without adequate follow up.  This format is felt to be most appropriate for this patient at this time.  The patient did not have access to video technology/had technical difficulties with video requiring transitioning to audio format only (telephone).  All issues noted in this document were discussed and addressed.  No physical exam could be performed with this format.  Please refer to the patient's chart for her  consent to telehealth for [facility].   Date:  07/25/2019   ID:  Valerie Gay, DOB 1941-03-08, MRN [id number]  Patient Location: Home Provider Location: Home  PCP:  Kathyrn Lass, MD  Cardiologist:  Glenetta Hew, MD  Electrophysiologist:  None   Evaluation Performed:  Follow-Up Visit  Chief Complaint:  none  History of Present Illness:    Valerie Gay is a 78 y.o. female with a hx of COPD on oxygen who was seen in the office 06/23/2019 with complaints of new lower extremity edema.  the patient had seen Dr. Ellyn Hack in 2017 for an incidental finding of coronary artery calcification on CT scan.  Myoview in 2017 was low risk with normal LV function and medical therapy was recommended.  Lipid therapy was mentioned (LDL 119) but the patient never had f/u lipids done on diet therapy.  There was concern she may have some degree of Rt heart failure with her LE edema.  An echo and labs were ordered, her PCP had already prescribed Lasix.   Echo done 06/29/2019 showed no LVD or evidence of significant Rt heart dysfunction.  Her labs were WNL, including her BNP.  Compression stocking had been recommended. She was contacted today by  phone for follow up.  She has done well since.  She still has some edema, usually at the end of the day.  She thinks the compression stockings have helped.   The patient does not have symptoms concerning for COVID-19 infection (fever, chills, cough, or new shortness of breath).    Past Medical History:  Diagnosis Date  . Anemia age 44  . Cellulitis and abscess of foot 03/2017   rt foot  . Colon polyps    2010   . COPD (chronic obstructive pulmonary disease) ([place])   . Diverticulosis   . History of hiatal hernia   . History of kidney stones   . Hyperlipidemia   . On home oxygen therapy    Past Surgical History:  Procedure Laterality Date  . BALLOON DILATION N/A 03/08/2017   Procedure: BALLOON DILATION;  Surgeon: Manus Gunning, MD;  Location: [facility];  Service: Gastroenterology;  Laterality: N/A;  . CATARACT EXTRACTION Right    August 15, 2013  . CHOLECYSTECTOMY N/A 06/22/2014   Procedure: LAPAROSCOPIC CHOLECYSTECTOMY WITH INTRAOPERATIVE CHOLANGIOGRAM;  Surgeon: Gayland Curry, MD;  Location: [facility];  Service: General;  Laterality: N/A;  . COLONOSCOPY N/A 02/19/2017   Procedure: COLONOSCOPY;  Surgeon: Manus Gunning, MD;  Location: [facility];  Service: Gastroenterology;  Laterality: N/A;  . ESOPHAGOGASTRODUODENOSCOPY N/A 02/19/2017   Procedure: ESOPHAGOGASTRODUODENOSCOPY (EGD);  Surgeon: Manus Gunning, MD;  Location: [facility];  Service: Gastroenterology;  Laterality: N/A;  . ESOPHAGOGASTRODUODENOSCOPY N/A 03/08/2017   Procedure: ESOPHAGOGASTRODUODENOSCOPY (  EGD);  Surgeon: Manus Gunning, MD;  Location: [facility];  Service: Gastroenterology;  Laterality: N/A;  . ESOPHAGOGASTRODUODENOSCOPY N/A 04/06/2017   Procedure: ESOPHAGOGASTRODUODENOSCOPY (EGD);  Surgeon: Manus Gunning, MD;  Location: [facility];  Service: Gastroenterology;  Laterality: N/A;  . ESOPHAGOGASTRODUODENOSCOPY (EGD) WITH PROPOFOL N/A 04/27/2017   Procedure:  ESOPHAGOGASTRODUODENOSCOPY (EGD) WITH PROPOFOL;  Surgeon: Manus Gunning, MD;  Location: [facility];  Service: Gastroenterology;  Laterality: N/A;  . NM MYOVIEW LTD  10/2016    EF 55-65% with normal LV function. LOW RISK. No ischemia or infarction.  . TONSILLECTOMY       Current Meds  Medication Sig  . albuterol (PROVENTIL HFA;VENTOLIN HFA) 108 (90 Base) MCG/ACT inhaler Inhale 2 puffs into the lungs every 6 (six) hours as needed for wheezing or shortness of breath.  [name] albuterol (PROVENTIL) (2.5 MG/3ML) 0.083% nebulizer solution USE 1 VIAL VIA NEBULIZER EVERY 8 HOURS AS NEEDED FOR WHEEZING  . aspirin EC 81 MG tablet Take 81 mg by mouth daily.  . furosemide (LASIX) 40 MG tablet Take 1 tablet (40 mg total) by mouth daily.  . hydroxypropyl methylcellulose / hypromellose (ISOPTO TEARS / GONIOVISC) 2.5 % ophthalmic solution Place 2 drops into both eyes 3 (three) times daily as needed for dry eyes.  . INCRUSE ELLIPTA 62.5 MCG/INH AEPB USE 1 INHALATION DAILY (Patient taking differently: Inhale 1 puff into the lungs daily. )  . OXYGEN Inhale 3 L into the lungs continuous.   . SYMBICORT 160-4.5 MCG/ACT inhaler INHALE 2 PUFFS INTO THE LUNGS TWICE DAILY  . UNKNOWN TO PATIENT Place into the right eye See admin instructions. Eye injection (name unknown to patient): Inject into the right eye every 7-8 weeks for wet macular degeneration     Allergies:   Spiriva handihaler [tiotropium bromide monohydrate] and Banana   Social History   Tobacco Use  . Smoking status: Former Smoker    Packs/day: 0.50    Years: 60.00    Pack years: 30.00    Types: Cigarettes    Quit date: 02/06/2019    Years since quitting: 0.4  . Smokeless tobacco: Never Used  . Tobacco comment: 01/17/19 down to 1-2 cigs per day  Substance Use Topics  . Alcohol use: No    Alcohol/week: 0.0 standard drinks  . Drug use: No     Family Hx: The patient's family history includes Alzheimer's disease in her mother; Cancer in her  father; Diabetes in her mother; Stomach cancer in her paternal grandmother. There is no history of Colon cancer or Esophageal cancer.  ROS:   Please see the history of present illness.    All other systems reviewed and are negative.   Prior CV studies:   The following studies were reviewed today:  Echo 06/29/2019  Labs/Other Tests and Data Reviewed:    EKG:  No ECG reviewed.  Recent Labs: 12/09/2018: Hemoglobin 11.7; Platelets 419 06/23/2019: BUN 17; Creatinine, Ser 0.69; NT-Pro BNP 167; Potassium 3.7; Sodium 143   Recent Lipid Panel Lab Results  Component Value Date/Time   CHOL 177 10/27/2016 11:17 AM   TRIG 91 10/27/2016 11:17 AM   HDL 40 (L) 10/27/2016 11:17 AM   CHOLHDL 4.4 10/27/2016 11:17 AM   LDLCALC 119 (H) 10/27/2016 11:17 AM    Wt Readings from Last 3 Encounters:  07/25/19 122 lb (55.3 kg)  06/23/19 122 lb 9.6 oz (55.6 kg)  01/17/19 105 lb 9.6 oz (47.9 kg)     Objective:    Vital Signs:  Ht 5' 2.5" (1.588 m)   Wt 122 lb (55.3 kg)   BMI 21.96 kg/m    VITAL SIGNS:  reviewed  ASSESSMENT & PLAN:    Edema Bilateral LE (foot) edema- I suspect this is chronic venous edema as opposed to a primary cardiac problem  Chronic respiratory failure with hypoxia (HCC) COPD- Maintained on 2 L O2  CAD (coronary artery disease) Incidental finding of coronary Ca++ on CT Nov 2017 Myoview low risk Dec 2017- I have asked her to have a lipid profile done.  I would recommend an LDL less than 100 at minimum.   COVID-19 Education: The signs and symptoms of COVID-19 were discussed with the patient and how to seek care for testing (follow up with PCP or arrange E-visit).  The importance of social distancing was discussed today.  Time:   Today, I have spent 20 minutes with the patient with telehealth technology discussing the above problems.     Medication Adjustments/Labs and Tests Ordered: Current medicines are reviewed at length with the patient today.  Concerns regarding  medicines are outlined above.   Tests Ordered: No orders of the defined types were placed in this encounter.   Medication Changes: No orders of the defined types were placed in this encounter.   Follow Up:  In Person Dr Ellyn Hack in one year  Signed, Kerin Ransom, Hershal Coria  07/25/2019 2:25 PM    [place]

## 2019-07-25 NOTE — Patient Instructions (Addendum)
Medication Instructions:  Your physician recommends that you continue on your current medications as directed. Please refer to the Current Medication list given to you today. If you need a refill on your cardiac medications before your next appointment, please call your pharmacy.   Lab work: Your physician recommends that you return for lab work in: LIPIDS-3-4 weeks FASTING If you have labs (blood work) drawn today and your tests are completely normal, you will receive your results only by: [name] MyChart Message (if you have MyChart) OR . A paper copy in the mail If you have any lab test that is abnormal or we need to change your treatment, we will call you to review the results.  Testing/Procedures: None   Follow-Up: At [facility], you and your health needs are our priority.  As part of our continuing mission to provide you with exceptional heart care, we have created designated Provider Care Teams.  These Care Teams include your primary Cardiologist (physician) and Advanced Practice Providers (APPs -  Physician Assistants and Nurse Practitioners) who all work together to provide you with the care you need, when you need it. You will need a follow up appointment in 12 months.  Please call our office 2 months in advance to schedule this appointment.  You may see Glenetta Hew, MD or one of the following Advanced Practice Providers on your designated Care Team:   Rosaria Ferries, PA-C . Jory Sims, DNP, ANP  Any Other Special Instructions Will Be Listed Below (If Applicable).

## 2019-07-25 NOTE — Telephone Encounter (Signed)
Contacted patient to discuss AVS Instructions. Gave patient Luke's recommendations from today's virtual office visit. Informed patient that someone from the scheduling dept will be in contact with them to schedule their follow up appt. Patient voiced understanding and AVS mailed.    

## 2019-07-25 NOTE — Telephone Encounter (Signed)
Virtual Visit Pre-Appointment Phone Call  "Valerie Gay, I am calling you today to discuss your upcoming appointment. We are currently trying to limit exposure to the virus that causes COVID-19 by seeing patients at home rather than in the office."  1. "What is the BEST phone number to call the day of the visit?" - include this in appointment notes  2. "Do you have or have access to (through a family member/friend) a smartphone with video capability that we can use for your visit?" a. If yes - list this number in appt notes as "cell" (if different from BEST phone #) and list the appointment type as a VIDEO visit in appointment notes b. If no - list the appointment type as a PHONE visit in appointment notes  3. Confirm consent - "In the setting of the current Covid19 crisis, you are scheduled for a (phone or video) visit with your provider on (date) at (time).  Just as we do with many in-office visits, in order for you to participate in this visit, we must obtain consent.  If you'd like, I can send this to your mychart (if signed up) or email for you to review.  Otherwise, I can obtain your verbal consent now.  All virtual visits are billed to your insurance company just like a normal visit would be.  By agreeing to a virtual visit, we'd like you to understand that the technology does not allow for your provider to perform an examination, and thus may limit your provider's ability to fully assess your condition. If your provider identifies any concerns that need to be evaluated in person, we will make arrangements to do so.  Finally, though the technology is pretty good, we cannot assure that it will always work on either your or our end, and in the setting of a video visit, we may have to convert it to a phone-only visit.  In either situation, we cannot ensure that we have a secure connection.  Are you willing to proceed?" STAFF: Did the patient verbally acknowledge consent to telehealth  visit? Document YES/NO here: YES  4. Advise patient to be prepared - "Two hours prior to your appointment, go ahead and check your blood pressure, pulse, oxygen saturation, and your weight (if you have the equipment to check those) and write them all down. When your visit starts, your provider will ask you for this information. If you have an Apple Watch or Kardia device, please plan to have heart rate information ready on the day of your appointment. Please have a pen and paper handy nearby the day of the visit as well."  5. Give patient instructions for MyChart download to smartphone OR Doximity/Doxy.me as below if video visit (depending on what platform provider is using)  6. Inform patient they will receive a phone call 15 minutes prior to their appointment time (may be from unknown caller ID) so they should be prepared to answer    TELEPHONE CALL NOTE  KEYDI ALDERS has been deemed a candidate for a follow-up tele-health visit to limit community exposure during the Covid-19 pandemic. I spoke with the patient via phone to ensure availability of phone/video source, confirm preferred email & phone number, and discuss instructions and expectations.  I reminded ARINN MIHALOVICH to be prepared with any vital sign and/or heart rhythm information that could potentially be obtained via home monitoring, at the time of her visit. I reminded HAZLEIGH MANWARING to expect a phone call  prior to her visit.  Harold Hedge, CMA 07/25/2019 1:58 PM   INSTRUCTIONS FOR DOWNLOADING THE MYCHART APP TO SMARTPHONE  - The patient must first make sure to have activated MyChart and know their login information - If Apple, go to [employer] and type in MyChart in the search bar and download the app. If Android, ask patient to go to [employer] and type in [place] in the search bar and download the app. The app is free but as with any other app downloads, their phone may require them to verify saved payment  information or Apple/Android password.  - The patient will need to then log into the app with their MyChart username and password, and select [facility] as their healthcare provider to link the account. When it is time for your visit, go to the MyChart app, find appointments, and click Begin Video Visit. Be sure to Select Allow for your device to access the Microphone and Camera for your visit. You will then be connected, and your provider will be with you shortly.  **If they have any issues connecting, or need assistance please contact MyChart service desk [phone])**  **If using a computer, in order to ensure the best quality for their visit they will need to use either of the following Internet Browsers: [employer], or Google Chrome**  IF USING DOXIMITY or DOXY.ME - The patient will receive a link just prior to their visit by text.     FULL LENGTH CONSENT FOR TELE-HEALTH VISIT   I hereby voluntarily request, consent and authorize [facility] and its employed or contracted physicians, physician assistants, nurse practitioners or other licensed health care professionals (the Practitioner), to provide me with telemedicine health care services (the "Services") as deemed necessary by the treating Practitioner. I acknowledge and consent to receive the Services by the Practitioner via telemedicine. I understand that the telemedicine visit will involve communicating with the Practitioner through live audiovisual communication technology and the disclosure of certain medical information by electronic transmission. I acknowledge that I have been given the opportunity to request an in-person assessment or other available alternative prior to the telemedicine visit and am voluntarily participating in the telemedicine visit.  I understand that I have the right to withhold or withdraw my consent to the use of telemedicine in the course of my care at any time, without affecting my right  to future care or treatment, and that the Practitioner or I may terminate the telemedicine visit at any time. I understand that I have the right to inspect all information obtained and/or recorded in the course of the telemedicine visit and may receive copies of available information for a reasonable fee.  I understand that some of the potential risks of receiving the Services via telemedicine include:  [name] Delay or interruption in medical evaluation due to technological equipment failure or disruption; . Information transmitted may not be sufficient (e.g. poor resolution of images) to allow for appropriate medical decision making by the Practitioner; and/or  . In rare instances, security protocols could fail, causing a breach of personal health information.  Furthermore, I acknowledge that it is my responsibility to provide information about my medical history, conditions and care that is complete and accurate to the best of my ability. I acknowledge that Practitioner's advice, recommendations, and/or decision may be based on factors not within their control, such as incomplete or inaccurate data provided by me or distortions of diagnostic images or specimens that may result from  electronic transmissions. I understand that the practice of medicine is not an exact science and that Practitioner makes no warranties or guarantees regarding treatment outcomes. I acknowledge that I will receive a copy of this consent concurrently upon execution via email to the email address I last provided but may also request a printed copy by calling the office of [facility].    I understand that my insurance will be billed for this visit.   I have read or had this consent read to me. . I understand the contents of this consent, which adequately explains the benefits and risks of the Services being provided via telemedicine.  . I have been provided ample opportunity to ask questions regarding this consent and the Services  and have had my questions answered to my satisfaction. . I give my informed consent for the services to be provided through the use of telemedicine in my medical care  By participating in this telemedicine visit I agree to the above.

## 2019-10-19 ENCOUNTER — Ambulatory Visit (INDEPENDENT_AMBULATORY_CARE_PROVIDER_SITE_OTHER): Payer: Medicare Other | Admitting: Primary Care

## 2019-10-19 ENCOUNTER — Other Ambulatory Visit: Payer: Self-pay

## 2019-10-19 ENCOUNTER — Telehealth: Payer: Self-pay | Admitting: Internal Medicine

## 2019-10-19 DIAGNOSIS — R600 Localized edema: Secondary | ICD-10-CM | POA: Diagnosis not present

## 2019-10-19 DIAGNOSIS — R0600 Dyspnea, unspecified: Secondary | ICD-10-CM

## 2019-10-19 DIAGNOSIS — R0609 Other forms of dyspnea: Secondary | ICD-10-CM

## 2019-10-19 MED ORDER — PREDNISONE 10 MG PO TABS: ORAL_TABLET | ORAL | 0 refills | Status: DC

## 2019-10-19 NOTE — Progress Notes (Signed)
Virtual Visit via Telephone Note  I connected with Valerie Gay on 10/19/19 at 12:00 PM EST by telephone and verified that I am speaking with the correct person using two identifiers.  Location: Patient: Home Provider: Office   I discussed the limitations, risks, security and privacy concerns of performing an evaluation and management service by telephone and the availability of in person appointments. I also discussed with the patient that there may be a patient responsible charge related to this service. The patient expressed understanding and agreed to proceed.   History of Present Illness: 78 year old female, current smoker (30 pack year hx). PMH significant for COPD GOLD II, chronic respiratory failure, CAD, GERD, esophageal stricture, dysphagia. Patient of Dr. Chase Caller.  Maintained on Symbicort 160 and Incruse.   10/19/2019 Patient contacted today for acute televisit, reports increased shortness of breath x 2 days with associated chest tightness. Compliant with Symbicort and Incruse as prescribed, she has been using her albuterol nebulizer 4 times a day with no real benefit. She does not take her diuretic regularly. She has noticed increased leg swelling. Reports that she has gained weight because she was told to. Denies fever, chills, sweats, change in smell/taste, cough or wheezing.  Observations/Objective:  Patient reported vitals: - O2 99% 3L   Assessment and Plan:  Dyspnea - Presumed COPD exacerbation versus hypervolemia  - RX prednisone 20mg  x 5 days - Advised she takes lasix as prescribed over the next several days - Monitor symptoms and if they do not improve with above plan notify office  Edema - BLE edema possible right heart failure    - Follows with cardiology, prescribed 40mg  lasix daily (not consistently taking)  Follow Up Instructions:  - Return if symptoms do not improve or worsen    I discussed the assessment and treatment plan with the patient. The  patient was provided an opportunity to ask questions and all were answered. The patient agreed with the plan and demonstrated an understanding of the instructions.   The patient was advised to call back or seek an in-person evaluation if the symptoms worsen or if the condition fails to improve as anticipated.  I provided 18 minutes of non-face-to-face time during this encounter.   Martyn Ehrich, NP

## 2019-10-19 NOTE — Telephone Encounter (Signed)
Called and spoke with pt who stated she began having increased SOB 2 days ago and also has tightness in chest.  Pt denies any fever and denies any complaint of cough. Pt also denies any real complaints of wheezing.  Pt is using all meds as prescribed and is using 3L O2 all the time. Asked pt to check O2 sats and she said they were running at 99% on 3L.  Pt has had to use albuterol inhaler about 4 times daily and has had to use the nebulizer several times in a single day. Stated to pt that we needed to schedule her a televisit to further evaluate and she verbalized understanding. televisit scheduled for pt with Beth today at 12pm. Nothing further needed.

## 2019-10-19 NOTE — Patient Instructions (Addendum)
  Dyspnea - Presumed COPD exacerbation versus hypervolemia  - RX prednisone 20mg  x 5 days - Take lasix as prescribed over the next several days - Monitor symptoms and if they do not improve with above plan notify office  Edema - Follows with cardiology, prescribed 40mg  lasix daily   COPD: - Continue Symbicort and Incruse as prescribed   Follow Up Instructions:  - Return if symptoms do not improve or worsen

## 2019-10-24 ENCOUNTER — Encounter: Payer: Self-pay | Admitting: Primary Care

## 2019-10-24 ENCOUNTER — Ambulatory Visit (INDEPENDENT_AMBULATORY_CARE_PROVIDER_SITE_OTHER): Payer: Medicare Other | Admitting: Primary Care

## 2019-10-24 DIAGNOSIS — J9611 Chronic respiratory failure with hypoxia: Secondary | ICD-10-CM | POA: Diagnosis not present

## 2019-10-24 DIAGNOSIS — R0600 Dyspnea, unspecified: Secondary | ICD-10-CM | POA: Diagnosis not present

## 2019-10-24 DIAGNOSIS — J449 Chronic obstructive pulmonary disease, unspecified: Secondary | ICD-10-CM | POA: Diagnosis not present

## 2019-10-24 DIAGNOSIS — R0609 Other forms of dyspnea: Secondary | ICD-10-CM

## 2019-10-24 NOTE — Patient Instructions (Addendum)
Continue Lasix 40mg  as prescribed Continue Symbicort two puffs twice daily Continue Incruse one puff once daily  Notify office if you develop worsening lower extremity swelling, shortness of breath, wheezing or cough    Follow-up: Due for visit with Dr. Chase Caller in 2-3 months

## 2019-10-24 NOTE — Progress Notes (Signed)
Virtual Visit via Telephone Note  I connected with Valerie Gay on 10/24/19 at 11:00 AM EST by telephone and verified that I am speaking with the correct person using two identifiers.  Location: Patient: Home Provider: Office   I discussed the limitations, risks, security and privacy concerns of performing an evaluation and management service by telephone and the availability of in person appointments. I also discussed with the patient that there may be a patient responsible charge related to this service. The patient expressed understanding and agreed to proceed.   History of Present Illness: 78 year old female, current smoker (30 pack year hx). PMH significant for COPD GOLD II, chronic respiratory failure, pulmonary nodule, CAD, GERD, esophageal stricture, dysphagia. Patient of Dr. Chase Caller.  Maintained on Symbicort 160 and Incruse.   Previous [facility] pulmonary encounter: 10/19/2019 Patient contacted today for acute televisit, reports increased shortness of breath x 2 days with associated chest tightness. Compliant with Symbicort and Incruse as prescribed, she has been using her albuterol nebulizer 4 times a day with no real benefit. She does not take her diuretic regularly. She has noticed increased leg swelling. Reports that she has gained weight because she was told to. Denies fever, chills, sweats, change in smell/taste, cough or wheezing.   10/24/2019 Patient contacted today for follow-up televisit for shortness of breath and leg swelling. She is feeling better, no acute complaints. She has been taking her lasix as prescribed and thinks that is what has help her breathing. She continues Symbicort/incruse as prescribed. She is only using her albuterol nebulizer once in the morning. Maintained on 3L oxygen, no additional O2 requirements. Denies active shortness of breath, wheezing or chest tightness. No further needs.   Observations/Objective:  - No shortness of breath, wheezing or cough  noted during phone conversation  Assessment and Plan:  Dyspnea: - Improved with resuming diuretic use - Continue lasix 40mg  daily as prescribed per cardiology - Monitor daily weights, LE swelling and dyspnea- notify office if worsens   COPD - Stable, no acute symptoms - Continue Symbicort 160 two puff twice daily - Continue Incruse one puff daily - Instructed to notify office if develops increased sob, wheezing or cough   Chronic respiratory failure with hypoxia - Stable, no increased oxygen demand - Continue 3L oxygen 24/7  Pulmonary nodule: - CT chest 2017 showed unchanged RUL nodules from 2015 and considered benign   Follow Up Instructions:   - Due for follow-up with Dr. Chase Caller in 2 months   I discussed the assessment and treatment plan with the patient. The patient was provided an opportunity to ask questions and all were answered. The patient agreed with the plan and demonstrated an understanding of the instructions.   The patient was advised to call back or seek an in-person evaluation if the symptoms worsen or if the condition fails to improve as anticipated.  I provided 15 minutes of non-face-to-face time during this encounter.   Martyn Ehrich, NP

## 2019-11-14 ENCOUNTER — Other Ambulatory Visit: Payer: Self-pay | Admitting: Internal Medicine

## 2019-12-05 DIAGNOSIS — H353211 Exudative age-related macular degeneration, right eye, with active choroidal neovascularization: Secondary | ICD-10-CM | POA: Diagnosis not present

## 2019-12-24 ENCOUNTER — Other Ambulatory Visit: Payer: Self-pay | Admitting: Primary Care

## 2020-01-17 DIAGNOSIS — Z23 Encounter for immunization: Secondary | ICD-10-CM | POA: Diagnosis not present

## 2020-01-17 DIAGNOSIS — S81812A Laceration without foreign body, left lower leg, initial encounter: Secondary | ICD-10-CM | POA: Diagnosis not present

## 2020-01-17 DIAGNOSIS — M79675 Pain in left toe(s): Secondary | ICD-10-CM | POA: Diagnosis not present

## 2020-01-18 DIAGNOSIS — S81812A Laceration without foreign body, left lower leg, initial encounter: Secondary | ICD-10-CM | POA: Diagnosis not present

## 2020-01-18 DIAGNOSIS — M79675 Pain in left toe(s): Secondary | ICD-10-CM | POA: Diagnosis not present

## 2020-01-18 DIAGNOSIS — Z23 Encounter for immunization: Secondary | ICD-10-CM | POA: Diagnosis not present

## 2020-01-22 ENCOUNTER — Emergency Department (HOSPITAL_COMMUNITY): Payer: Medicare Other

## 2020-01-22 ENCOUNTER — Other Ambulatory Visit: Payer: Self-pay

## 2020-01-22 ENCOUNTER — Encounter (HOSPITAL_COMMUNITY): Payer: Self-pay

## 2020-01-22 ENCOUNTER — Inpatient Hospital Stay (HOSPITAL_COMMUNITY)
Admission: EM | Admit: 2020-01-22 | Discharge: 2020-01-26 | DRG: 602 | Disposition: A | Discharge status: Skilled Nursing Facility | Payer: Medicare Other | Attending: Family Medicine | Admitting: Family Medicine

## 2020-01-22 DIAGNOSIS — Z9181 History of falling: Secondary | ICD-10-CM

## 2020-01-22 DIAGNOSIS — S9032XA Contusion of left foot, initial encounter: Secondary | ICD-10-CM | POA: Diagnosis not present

## 2020-01-22 DIAGNOSIS — Z833 Family history of diabetes mellitus: Secondary | ICD-10-CM

## 2020-01-22 DIAGNOSIS — S99922A Unspecified injury of left foot, initial encounter: Secondary | ICD-10-CM | POA: Diagnosis not present

## 2020-01-22 DIAGNOSIS — L03116 Cellulitis of left lower limb: Principal | ICD-10-CM | POA: Diagnosis present

## 2020-01-22 DIAGNOSIS — Z7951 Long term (current) use of inhaled steroids: Secondary | ICD-10-CM

## 2020-01-22 DIAGNOSIS — E876 Hypokalemia: Secondary | ICD-10-CM | POA: Diagnosis present

## 2020-01-22 DIAGNOSIS — R944 Abnormal results of kidney function studies: Secondary | ICD-10-CM | POA: Diagnosis present

## 2020-01-22 DIAGNOSIS — J449 Chronic obstructive pulmonary disease, unspecified: Secondary | ICD-10-CM | POA: Diagnosis not present

## 2020-01-22 DIAGNOSIS — Z800 Family history of malignant neoplasm of digestive organs: Secondary | ICD-10-CM

## 2020-01-22 DIAGNOSIS — M7989 Other specified soft tissue disorders: Secondary | ICD-10-CM | POA: Diagnosis not present

## 2020-01-22 DIAGNOSIS — Z888 Allergy status to other drugs, medicaments and biological substances status: Secondary | ICD-10-CM

## 2020-01-22 DIAGNOSIS — R7989 Other specified abnormal findings of blood chemistry: Secondary | ICD-10-CM | POA: Diagnosis not present

## 2020-01-22 DIAGNOSIS — I5033 Acute on chronic diastolic (congestive) heart failure: Secondary | ICD-10-CM | POA: Diagnosis not present

## 2020-01-22 DIAGNOSIS — Z87442 Personal history of urinary calculi: Secondary | ICD-10-CM

## 2020-01-22 DIAGNOSIS — Z9981 Dependence on supplemental oxygen: Secondary | ICD-10-CM

## 2020-01-22 DIAGNOSIS — I2510 Atherosclerotic heart disease of native coronary artery without angina pectoris: Secondary | ICD-10-CM | POA: Diagnosis present

## 2020-01-22 DIAGNOSIS — L03115 Cellulitis of right lower limb: Secondary | ICD-10-CM | POA: Diagnosis not present

## 2020-01-22 DIAGNOSIS — J9611 Chronic respiratory failure with hypoxia: Secondary | ICD-10-CM | POA: Diagnosis present

## 2020-01-22 DIAGNOSIS — S8012XA Contusion of left lower leg, initial encounter: Secondary | ICD-10-CM | POA: Diagnosis not present

## 2020-01-22 DIAGNOSIS — Z79899 Other long term (current) drug therapy: Secondary | ICD-10-CM

## 2020-01-22 DIAGNOSIS — Z9119 Patient's noncompliance with other medical treatment and regimen: Secondary | ICD-10-CM

## 2020-01-22 DIAGNOSIS — L0390 Cellulitis, unspecified: Secondary | ICD-10-CM | POA: Diagnosis not present

## 2020-01-22 DIAGNOSIS — M25572 Pain in left ankle and joints of left foot: Secondary | ICD-10-CM | POA: Diagnosis not present

## 2020-01-22 DIAGNOSIS — E785 Hyperlipidemia, unspecified: Secondary | ICD-10-CM | POA: Diagnosis present

## 2020-01-22 DIAGNOSIS — Z20822 Contact with and (suspected) exposure to covid-19: Secondary | ICD-10-CM | POA: Diagnosis not present

## 2020-01-22 DIAGNOSIS — R262 Difficulty in walking, not elsewhere classified: Secondary | ICD-10-CM | POA: Diagnosis present

## 2020-01-22 DIAGNOSIS — R600 Localized edema: Secondary | ICD-10-CM | POA: Diagnosis present

## 2020-01-22 DIAGNOSIS — L988 Other specified disorders of the skin and subcutaneous tissue: Secondary | ICD-10-CM | POA: Diagnosis not present

## 2020-01-22 DIAGNOSIS — Z03818 Encounter for observation for suspected exposure to other biological agents ruled out: Secondary | ICD-10-CM | POA: Diagnosis not present

## 2020-01-22 DIAGNOSIS — M79672 Pain in left foot: Secondary | ICD-10-CM | POA: Diagnosis not present

## 2020-01-22 DIAGNOSIS — S99912A Unspecified injury of left ankle, initial encounter: Secondary | ICD-10-CM | POA: Diagnosis not present

## 2020-01-22 DIAGNOSIS — H8110 Benign paroxysmal vertigo, unspecified ear: Secondary | ICD-10-CM | POA: Diagnosis present

## 2020-01-22 DIAGNOSIS — Z820 Family history of epilepsy and other diseases of the nervous system: Secondary | ICD-10-CM

## 2020-01-22 DIAGNOSIS — Z87891 Personal history of nicotine dependence: Secondary | ICD-10-CM

## 2020-01-22 LAB — COMPREHENSIVE METABOLIC PANEL
ALT: 11 U/L (ref 0–44)
AST: 15 U/L (ref 15–41)
Albumin: 3.3 g/dL — ABNORMAL LOW (ref 3.5–5.0)
Alkaline Phosphatase: 64 U/L (ref 38–126)
Anion gap: 8 (ref 5–15)
BUN: 26 mg/dL — ABNORMAL HIGH (ref 8–23)
CO2: 27 mmol/L (ref 22–32)
Calcium: 8.8 mg/dL — ABNORMAL LOW (ref 8.9–10.3)
Chloride: 107 mmol/L (ref 98–111)
Creatinine, Ser: 0.65 mg/dL (ref 0.44–1.00)
GFR calc Af Amer: >60 mL/min (ref >60)
GFR calc non Af Amer: >60 mL/min (ref >60)
Glucose, Bld: 119 mg/dL — ABNORMAL HIGH (ref 70–99)
Potassium: 3.7 mmol/L (ref 3.5–5.1)
Sodium: 142 mmol/L (ref 135–145)
Total Bilirubin: 0.9 mg/dL (ref 0.3–1.2)
Total Protein: 6.4 g/dL — ABNORMAL LOW (ref 6.5–8.1)

## 2020-01-22 LAB — CBC WITH DIFFERENTIAL/PLATELET
Abs Immature Granulocytes: 0.06 10*3/uL (ref 0.00–0.07)
Basophils Absolute: 0.1 10*3/uL (ref 0.0–0.1)
Basophils Relative: 0 %
Eosinophils Absolute: 0.3 10*3/uL (ref 0.0–0.5)
Eosinophils Relative: 3 %
HCT: 35.9 % — ABNORMAL LOW (ref 36.0–46.0)
Hemoglobin: 11.2 g/dL — ABNORMAL LOW (ref 12.0–15.0)
Immature Granulocytes: 0 %
Lymphocytes Relative: 10 %
Lymphs Abs: 1.3 10*3/uL (ref 0.7–4.0)
MCH: 30.4 pg (ref 26.0–34.0)
MCHC: 31.2 g/dL (ref 30.0–36.0)
MCV: 97.3 fL (ref 80.0–100.0)
Monocytes Absolute: 0.9 10*3/uL (ref 0.1–1.0)
Monocytes Relative: 7 %
Neutro Abs: 10.7 10*3/uL — ABNORMAL HIGH (ref 1.7–7.7)
Neutrophils Relative %: 80 %
Platelets: 483 10*3/uL — ABNORMAL HIGH (ref 150–400)
RBC: 3.69 MIL/uL — ABNORMAL LOW (ref 3.87–5.11)
RDW: 13.6 % (ref 11.5–15.5)
WBC: 13.4 10*3/uL — ABNORMAL HIGH (ref 4.0–10.5)
nRBC: 0 % (ref 0.0–0.2)

## 2020-01-22 LAB — LACTIC ACID, PLASMA: Lactic Acid, Venous: 1.2 mmol/L (ref 0.5–1.9)

## 2020-01-22 MED ORDER — HYDROCODONE-ACETAMINOPHEN 5-325 MG PO TABS
1.0000 | ORAL_TABLET | ORAL | Status: DC | PRN
  Administered 2020-01-23 (×2): 1 via ORAL
  Administered 2020-01-24: 04:00:00 2 via ORAL
  Filled 2020-01-22: qty 2
  Filled 2020-01-22: qty 1
  Filled 2020-01-22: qty 2
  Filled 2020-01-22: qty 1

## 2020-01-22 MED ORDER — SALINE SPRAY 0.65 % NA SOLN: 1.0000 | NASAL | Status: DC | PRN

## 2020-01-22 MED ORDER — HYDROCODONE-ACETAMINOPHEN 5-325 MG PO TABS: 1.0000 | ORAL_TABLET | ORAL | Status: DC | PRN

## 2020-01-22 MED ORDER — SODIUM CHLORIDE 0.9 % IV SOLN
1.0000 g | INTRAVENOUS | Status: DC
  Administered 2020-01-23 (×2): 1 g via INTRAVENOUS
  Filled 2020-01-22: qty 10
  Filled 2020-01-22: qty 1

## 2020-01-22 MED ORDER — POLYETHYLENE GLYCOL 3350 17 G PO PACK: 17.0000 g | PACK | Freq: Every day | ORAL | Status: DC | PRN

## 2020-01-22 MED ORDER — ENOXAPARIN SODIUM 40 MG/0.4ML ~~LOC~~ SOLN: 40.0000 mg | SUBCUTANEOUS | Status: DC

## 2020-01-22 MED ORDER — ONDANSETRON HCL 4 MG/2ML IJ SOLN
4.0000 mg | Freq: Four times a day (QID) | INTRAMUSCULAR | Status: DC | PRN
  Administered 2020-01-23 – 2020-01-24 (×3): 4 mg via INTRAVENOUS
  Filled 2020-01-22 (×3): qty 2

## 2020-01-22 MED ORDER — ONDANSETRON HCL 4 MG PO TABS: 4.0000 mg | ORAL_TABLET | Freq: Four times a day (QID) | ORAL | Status: DC | PRN

## 2020-01-22 MED ORDER — ACETAMINOPHEN 650 MG RE SUPP: 650.0000 mg | Freq: Four times a day (QID) | RECTAL | Status: DC | PRN

## 2020-01-22 MED ORDER — ACETAMINOPHEN 325 MG PO TABS: 650.0000 mg | ORAL_TABLET | Freq: Four times a day (QID) | ORAL | Status: DC | PRN

## 2020-01-22 MED ORDER — UMECLIDINIUM BROMIDE 62.5 MCG/INH IN AEPB
1.0000 | INHALATION_SPRAY | Freq: Every day | RESPIRATORY_TRACT | Status: DC
  Filled 2020-01-22: qty 7

## 2020-01-22 MED ORDER — ALBUTEROL SULFATE HFA 108 (90 BASE) MCG/ACT IN AERS
2.0000 | INHALATION_SPRAY | Freq: Four times a day (QID) | RESPIRATORY_TRACT | Status: DC | PRN
  Filled 2020-01-22: qty 6.7

## 2020-01-22 MED ORDER — VANCOMYCIN HCL IN DEXTROSE 1-5 GM/200ML-% IV SOLN
1000.0000 mg | INTRAVENOUS | Status: DC
  Administered 2020-01-22: 22:00:00 1000 mg via INTRAVENOUS
  Filled 2020-01-22: qty 200

## 2020-01-22 MED ORDER — MOMETASONE FURO-FORMOTEROL FUM 200-5 MCG/ACT IN AERO
2.0000 | INHALATION_SPRAY | Freq: Two times a day (BID) | RESPIRATORY_TRACT | Status: DC
  Administered 2020-01-23 – 2020-01-26 (×7): 2 via RESPIRATORY_TRACT
  Filled 2020-01-22: qty 8.8

## 2020-01-22 NOTE — Progress Notes (Signed)
Pharmacy Antibiotic Note  REMILYNN RICKELS is a 79 y.o. female admitted on 01/22/2020 with cellulitis.  Pharmacy has been consulted for Vancomcyin dosing.  Plan: - No loading dose of vancomycin indicated for cellulitis  - Start Vancomycin 1000mg  IV q24h  - Est Calc AUC 571 - Monitor patients renal function and uring output  - De-escalate abx when able  Height: 5\' 2"  (157.5 cm) Weight: 130 lb (59 kg) IBW/kg (Calculated) : 50.1  Temp (24hrs), Avg:98.8 F (37.1 C), Min:98.8 F (37.1 C), Max:98.8 F (37.1 C)  Recent Labs  Lab 01/22/20 1713  WBC 13.4*  CREATININE 0.65    Estimated Creatinine Clearance: 44.4 mL/min (by C-G formula based on SCr of 0.65 mg/dL).    Allergies  Allergen Reactions  . Spiriva Handihaler [Tiotropium Bromide Monohydrate] Hives, Itching and Rash     on upper body  . Banana Itching    Antimicrobials this admission: 3/8 Vancomycin >>    Dose adjustments this admission:   Microbiology results: Pending   Thank you for allowing pharmacy to be a part of this patient's care.  Duanne Limerick PharmD. BCPS  01/22/2020 8:37 PM

## 2020-01-22 NOTE — ED Provider Notes (Signed)
Emergency Department Provider Note   I have reviewed the triage vital signs and the nursing notes.   HISTORY  Chief Complaint Leg Swelling   HPI Valerie Gay is a 79 y.o. female with PMH of COPD on 3L [place] baseline and anemia presents to the emergency department for evaluation of left foot/leg pain with swelling, redness.  Patient had a mechanical fall 6 days ago where she  tripped and slid down several steps.  She denies head injury or loss of consciousness.  She is not experiencing neck or back pain.  She did not injure her arms.  She has a small skin tear to the left knee.  She went to see her primary care doctor who performed x-rays and dressed the wounds.  She has had increased swelling especially in the foot with notable skin splitting in this area.  She denies fevers.  She has not lost sensation in her foot/toes.  No discoloration to the toes.  She has had difficulty walking since the fall.  No increased oxygen requirement.    Past Medical History:  Diagnosis Date  . Anemia age 45  . Cellulitis and abscess of foot 03/2017   rt foot  . Colon polyps    2010   . COPD (chronic obstructive pulmonary disease) ([place])   . Diverticulosis   . History of hiatal hernia   . History of kidney stones   . Hyperlipidemia   . On home oxygen therapy     Patient Active Problem List   Diagnosis Date Noted  . Bilateral edema of lower extremity 01/22/2020  . Edema 06/23/2019  . CAD (coronary artery disease) 06/23/2019  . Chronic respiratory failure with hypoxia ([place]) 01/17/2019  . Cellulitis of left lower extremity 04/13/2017  . GERD (gastroesophageal reflux disease) 04/13/2017  . Esophageal stricture   . Dysphagia   . Abnormal barium swallow   . History of colonic polyps   . Polyp of ascending colon   . Polyp of transverse colon   . Acute URI 12/13/2016  . Coronary artery calcification 10/23/2016  . Hyperlipidemia LDL goal <100 10/23/2016  . Medication management 10/23/2016    . DOE (dyspnea on exertion) 10/23/2016  . Lung nodule 02/26/2015  . Nodule of right lung 11/28/2014  . Acute calculous cholecystitis 06/22/2014  . Acute cholecystitis 06/22/2014  . Pre-operative respiratory examination 06/22/2014  . Abnormal chest x-ray 05/28/2014  . COPD Gold II D, still smoking 05/07/2014  . Nicotine abuse 03/27/2014  . Hypokalemia 03/27/2014  . Leukocytosis 03/27/2014  . TOBACCO USE 11/05/2008  . COPD (chronic obstructive pulmonary disease) ([place]) 11/05/2008  . HYPERGLYCEMIA 11/05/2008  . ANEMIA-NOS 07/21/2007    Past Surgical History:  Procedure Laterality Date  . BALLOON DILATION N/A 03/08/2017   Procedure: BALLOON DILATION;  Surgeon: Manus Gunning, MD;  Location: [facility];  Service: Gastroenterology;  Laterality: N/A;  . CATARACT EXTRACTION Right    August 15, 2013  . CHOLECYSTECTOMY N/A 06/22/2014   Procedure: LAPAROSCOPIC CHOLECYSTECTOMY WITH INTRAOPERATIVE CHOLANGIOGRAM;  Surgeon: Gayland Curry, MD;  Location: [facility];  Service: General;  Laterality: N/A;  . COLONOSCOPY N/A 02/19/2017   Procedure: COLONOSCOPY;  Surgeon: Manus Gunning, MD;  Location: [facility];  Service: Gastroenterology;  Laterality: N/A;  . ESOPHAGOGASTRODUODENOSCOPY N/A 02/19/2017   Procedure: ESOPHAGOGASTRODUODENOSCOPY (EGD);  Surgeon: Manus Gunning, MD;  Location: [facility];  Service: Gastroenterology;  Laterality: N/A;  . ESOPHAGOGASTRODUODENOSCOPY N/A 03/08/2017   Procedure: ESOPHAGOGASTRODUODENOSCOPY (EGD);  Surgeon: Manus Gunning, MD;  Location: [facility];  Service: Gastroenterology;  Laterality: N/A;  . ESOPHAGOGASTRODUODENOSCOPY N/A 04/06/2017   Procedure: ESOPHAGOGASTRODUODENOSCOPY (EGD);  Surgeon: Manus Gunning, MD;  Location: [facility];  Service: Gastroenterology;  Laterality: N/A;  . ESOPHAGOGASTRODUODENOSCOPY (EGD) WITH PROPOFOL N/A 04/27/2017   Procedure: ESOPHAGOGASTRODUODENOSCOPY (EGD) WITH PROPOFOL;  Surgeon:  Manus Gunning, MD;  Location: [facility];  Service: Gastroenterology;  Laterality: N/A;  . NM MYOVIEW LTD  10/2016    EF 55-65% with normal LV function. LOW RISK. No ischemia or infarction.  . TONSILLECTOMY      Allergies Spiriva handihaler [tiotropium bromide monohydrate] and Banana  Family History  Problem Relation Age of Onset  . Alzheimer's disease Mother   . Diabetes Mother   . Cancer Father   . Stomach cancer Paternal Grandmother   . Colon cancer Neg Hx   . Esophageal cancer Neg Hx     Social History Social History   Tobacco Use  . Smoking status: Former Smoker    Packs/day: 0.50    Years: 60.00    Pack years: 30.00    Types: Cigarettes    Quit date: 02/06/2019    Years since quitting: 0.9  . Smokeless tobacco: Never Used  . Tobacco comment: 01/17/19 down to 1-2 cigs per day  Substance Use Topics  . Alcohol use: No    Alcohol/week: 0.0 standard drinks  . Drug use: No    Review of Systems  Constitutional: No fever/chills Eyes: No visual changes. ENT: No sore throat. Cardiovascular: Denies chest pain. Respiratory: Denies shortness of breath. Gastrointestinal: No abdominal pain.  No nausea, no vomiting.  No diarrhea.  No constipation. Genitourinary: Negative for dysuria. Musculoskeletal: Left leg pain/swelling.  Skin: Skin tear to left knee and open wound to the top of the left foot with significant swelling.  Neurological: Negative for headaches, focal weakness or numbness.  10-point ROS otherwise negative.  ____________________________________________   PHYSICAL EXAM:  VITAL SIGNS: ED Triage Vitals  Enc Vitals Group     BP 01/22/20 1647 140/65     Pulse Rate 01/22/20 1647 (!) 105     Resp 01/22/20 1647 17     Temp 01/22/20 1647 98.8 F (37.1 C)     Temp Source 01/22/20 1647 Oral     SpO2 01/22/20 1647 100 %     Weight 01/22/20 1703 130 lb (59 kg)     Height 01/22/20 1703 5\' 2"  (1.575 m)   Constitutional: Alert and oriented. Well  appearing and in no acute distress. Eyes: Conjunctivae are normal. Head: Atraumatic. Nose: No congestion/rhinnorhea. Mouth/Throat: Mucous membranes are moist.   Neck: No stridor.   Cardiovascular: Tachycardia. Good peripheral circulation. Grossly normal heart sounds.   Respiratory: Normal respiratory effort.  No retractions. Lungs with end-expiratory wheezing (mild).  Gastrointestinal: Soft and nontender. No distention.  Musculoskeletal: Patient with pitting edema in the left foot and up the left leg.  There is an overlying skin tear with clear fluid coming from the wound.  Nothing purulent or foul-smelling.  No necrosis.  Neurologic:  Normal speech and language. No gross focal neurologic deficits are appreciated.  Skin:  Skin is warm.  Significant pitting edema in the left lower extremity compared to the right with skin tear pictured below and surrounding erythema with warmth.      ____________________________________________   LABS (all labs ordered are listed, but only abnormal results are displayed)  Labs Reviewed  CBC WITH DIFFERENTIAL/PLATELET - Abnormal; Notable for the following components:  Result Value   WBC 13.4 (*)    RBC 3.69 (*)    Hemoglobin 11.2 (*)    HCT 35.9 (*)    Platelets 483 (*)    Neutro Abs 10.7 (*)    All other components within normal limits  COMPREHENSIVE METABOLIC PANEL - Abnormal; Notable for the following components:   Glucose, Bld 119 (*)    BUN 26 (*)    Calcium 8.8 (*)    Total Protein 6.4 (*)    Albumin 3.3 (*)    All other components within normal limits  BASIC METABOLIC PANEL - Abnormal; Notable for the following components:   Potassium 3.4 (*)    BUN 24 (*)    Calcium 8.6 (*)    All other components within normal limits  CBC WITH DIFFERENTIAL/PLATELET - Abnormal; Notable for the following components:   WBC 13.2 (*)    RBC 3.16 (*)    Hemoglobin 9.6 (*)    HCT 30.8 (*)    Neutro Abs 9.9 (*)    Monocytes Absolute 1.2 (*)    All  other components within normal limits  SARS CORONAVIRUS 2 (TAT 6-24 HRS)  MRSA PCR SCREENING  CULTURE, BLOOD (ROUTINE X 2)  CULTURE, BLOOD (ROUTINE X 2)  LACTIC ACID, PLASMA   ____________________________________________  RADIOLOGY  CT TIBIA FIBULA LEFT WO CONTRAST  Result Date: 01/22/2020 CLINICAL DATA:  Pain and swelling status post fall. EXAM: CT OF THE LEFT TIBIA AND FIBULA, LEFT FOOT, AND LEFT ANKLE TECHNIQUE: Multidetector CT imaging of the lower left extremity was performed according to the standard protocol. COMPARISON:  None. FINDINGS: Bones/Joint/Cartilage There is no acute displaced fracture.  There is an os trigonum. Ligaments Suboptimally assessed by CT. Muscles and Tendons No acute muscular abnormality. Soft tissues There is extensive soft tissue swelling about the foot with a moderate-sized hematoma involving the lateral aspect of the midfoot. This hematoma measures approximately 7.2 x 2.4 cm. Vascular calcifications are noted and likely explains the finding seen on the patient's recent foot radiograph. There is soft tissue swelling about the left lower extremity with some overlying skin thickening. IMPRESSION: 1. No acute fracture or dislocation. Curvilinear density seen on the patient's recent foot radiograph is related to vascular calcifications. 2. Extensive soft tissue swelling about the foot with a moderate-sized hematoma involving the lateral aspect of the midfoot. Electronically Signed   By: Constance Holster M.D.   On: 01/22/2020 22:29   CT FOOT LEFT WO CONTRAST  Result Date: 01/22/2020 CLINICAL DATA:  Pain and swelling status post fall. EXAM: CT OF THE LEFT TIBIA AND FIBULA, LEFT FOOT, AND LEFT ANKLE TECHNIQUE: Multidetector CT imaging of the lower left extremity was performed according to the standard protocol. COMPARISON:  None. FINDINGS: Bones/Joint/Cartilage There is no acute displaced fracture.  There is an os trigonum. Ligaments Suboptimally assessed by CT. Muscles and  Tendons No acute muscular abnormality. Soft tissues There is extensive soft tissue swelling about the foot with a moderate-sized hematoma involving the lateral aspect of the midfoot. This hematoma measures approximately 7.2 x 2.4 cm. Vascular calcifications are noted and likely explains the finding seen on the patient's recent foot radiograph. There is soft tissue swelling about the left lower extremity with some overlying skin thickening. IMPRESSION: 1. No acute fracture or dislocation. Curvilinear density seen on the patient's recent foot radiograph is related to vascular calcifications. 2. Extensive soft tissue swelling about the foot with a moderate-sized hematoma involving the lateral aspect of the midfoot. Electronically Signed  By: Constance Holster M.D.   On: 01/22/2020 22:29   CT ANKLE LEFT WO CONTRAST  Result Date: 01/22/2020 CLINICAL DATA:  Pain and swelling status post fall. EXAM: CT OF THE LEFT TIBIA AND FIBULA, LEFT FOOT, AND LEFT ANKLE TECHNIQUE: Multidetector CT imaging of the lower left extremity was performed according to the standard protocol. COMPARISON:  None. FINDINGS: Bones/Joint/Cartilage There is no acute displaced fracture.  There is an os trigonum. Ligaments Suboptimally assessed by CT. Muscles and Tendons No acute muscular abnormality. Soft tissues There is extensive soft tissue swelling about the foot with a moderate-sized hematoma involving the lateral aspect of the midfoot. This hematoma measures approximately 7.2 x 2.4 cm. Vascular calcifications are noted and likely explains the finding seen on the patient's recent foot radiograph. There is soft tissue swelling about the left lower extremity with some overlying skin thickening. IMPRESSION: 1. No acute fracture or dislocation. Curvilinear density seen on the patient's recent foot radiograph is related to vascular calcifications. 2. Extensive soft tissue swelling about the foot with a moderate-sized hematoma involving the lateral  aspect of the midfoot. Electronically Signed   By: Constance Holster M.D.   On: 01/22/2020 22:29   DG Foot Complete Left  Result Date: 01/22/2020 CLINICAL DATA:  Fall several days ago with foot pain, initial encounter EXAM: LEFT FOOT - COMPLETE 3+ VIEW COMPARISON:  01/17/2020 FINDINGS: Considerable soft tissue swelling is noted. There is a linear density noted along the base of the metatarsals on the lateral film only which may represent a small avulsion fracture from 1 of the metatarsal bases. No other fracture is seen. No other soft tissue abnormality is noted. IMPRESSION: Considerable soft tissue swelling. Changes suspicious for an avulsion at the base of the metatarsals best seen on the lateral projection. Electronically Signed   By: Inez Catalina M.D.   On: 01/22/2020 18:00    ____________________________________________   PROCEDURES  Procedure(s) performed:   Procedures  None ____________________________________________   INITIAL IMPRESSION / ASSESSMENT AND PLAN / ED COURSE  Pertinent labs & imaging results that were available during my care of the patient were reviewed by me and considered in my medical decision making (see chart for details).   Patient presents to the emergency department for evaluation of left leg swelling and pain.  X-ray and lab work from triage reviewed showing leukocytosis to 13 and x-ray showing possible avulsion type fractures.  Plan for CT imaging of the left lower extremity to further characterize possible fractures and rule out any subcutaneous air which may suggest fasciitis.  Doubt this clinically but given the degree of swelling and skin breakdown will follow up.  Will start antibiotics.  Patient will likely require admission for cellulitis.   CT without acute fracture. No subcutaneous gas noted. Vitals are stable. Plan for admit for abx and wound mgmt.   Discussed patient's case with [facility] to request admission. Patient and family (if present) updated  with plan. Care transferred to [facility] service.  I reviewed all nursing notes, vitals, pertinent old records, EKGs, labs, imaging (as available). ____________________________________________  FINAL CLINICAL IMPRESSION(S) / ED DIAGNOSES  Final diagnoses:  Cellulitis of right lower extremity     MEDICATIONS GIVEN DURING THIS VISIT:  Medications  mometasone-formoterol (DULERA) 200-5 MCG/ACT inhaler 2 puff (2 puffs Inhalation Given 01/23/20 0835)  sodium chloride (OCEAN) 0.65 % nasal spray 1 spray (has no administration in time range)  acetaminophen (TYLENOL) tablet 650 mg (has no administration in time range)    Or  acetaminophen (TYLENOL) suppository 650 mg (has no administration in time range)  polyethylene glycol (MIRALAX / GLYCOLAX) packet 17 g (has no administration in time range)  ondansetron (ZOFRAN) tablet 4 mg (has no administration in time range)    Or  ondansetron (ZOFRAN) injection 4 mg (has no administration in time range)  HYDROcodone-acetaminophen (NORCO/VICODIN) 5-325 MG per tablet 1-2 tablet (1 tablet Oral Given 01/23/20 0929)  cefTRIAXone (ROCEPHIN) 1 g in sodium chloride 0.9 % 100 mL IVPB (0 g Intravenous Stopped 01/23/20 0139)  albuterol (PROVENTIL) (2.5 MG/3ML) 0.083% nebulizer solution 3 mL (3 mLs Inhalation Given 01/23/20 0259)  umeclidinium bromide (INCRUSE ELLIPTA) 62.5 MCG/INH 1 puff (1 puff Inhalation Given 01/23/20 0836)  potassium chloride SA (KLOR-CON) CR tablet 40 mEq (40 mEq Oral Given 01/23/20 0929)    Note:  This document was prepared using Dragon voice recognition software and may include unintentional dictation errors.  Nanda Quinton, MD, [facility] Emergency Medicine    Orrie Lascano, [facility], MD 01/23/20 1115

## 2020-01-22 NOTE — H&P (Signed)
History and Physical    SELINA PALU [id number] DOB: [birth year] DOA: 01/22/2020  PCP: Kathyrn Lass, MD   Patient coming from: Home   Chief Complaint: Left leg injury with redness and swelling   HPI: BRITLYN MCNAMER is a 79 y.o. female with medical history significant for COPD with chronic hypoxic respiratory failure, lower extremity swelling, and now presenting to the emergency department for evaluation of worsening left lower extremity swelling and redness.  Patient reports that she fell on some stairs on 01/16/2020, suffered some abrasions to the left foot and knee, was seen for this by her outpatient physician with negative radiographs, had her tetanus immunization updated, and was given a compression wrap.  Since that time, she has had progressive swelling, redness, and pain involving the left foot and knee.  She has had bilateral lower extremity swelling for several months for which she was started on Lasix by her PCP and evaluated by cardiology and had echocardiogram with normal EF, normal diastolic function, and no significant RV dysfunction.  She denies any fevers or chills, denies cough, denies any worsening in her chronic dyspnea, and reports that she has received both doses of the COVID-19 vaccine.  ED Course: Upon arrival to the ED, patient is found to be afebrile, saturating well on 2 L/min of supplemental oxygen, and with stable blood pressure.  Chemistry panel is notable for an elevated BUN to creatinine ratio and CBC features a leukocytosis to 13,400 and mild thrombocytosis.  Lactic acid is reassuringly normal.  Radiographs of the left foot demonstrate soft tissue swelling and question of possible avulsion fracture.  CT of the left knee, tibia/fibula, and foot are negative for acute fracture or dislocation and negative for avulsion fracture as questioned on the plain films.  Blood cultures were collected in the ED, vancomycin was started, and COVID-19 screening test was ordered  but not yet collected.  Review of Systems:  All other systems reviewed and apart from HPI, are negative.  Past Medical History:  Diagnosis Date  . Anemia age 50  . Cellulitis and abscess of foot 03/2017   rt foot  . Colon polyps    2010   . COPD (chronic obstructive pulmonary disease) ([place])   . Diverticulosis   . History of hiatal hernia   . History of kidney stones   . Hyperlipidemia   . On home oxygen therapy     Past Surgical History:  Procedure Laterality Date  . BALLOON DILATION N/A 03/08/2017   Procedure: BALLOON DILATION;  Surgeon: Manus Gunning, MD;  Location: [facility];  Service: Gastroenterology;  Laterality: N/A;  . CATARACT EXTRACTION Right    August 15, 2013  . CHOLECYSTECTOMY N/A 06/22/2014   Procedure: LAPAROSCOPIC CHOLECYSTECTOMY WITH INTRAOPERATIVE CHOLANGIOGRAM;  Surgeon: Gayland Curry, MD;  Location: [facility];  Service: General;  Laterality: N/A;  . COLONOSCOPY N/A 02/19/2017   Procedure: COLONOSCOPY;  Surgeon: Manus Gunning, MD;  Location: [facility];  Service: Gastroenterology;  Laterality: N/A;  . ESOPHAGOGASTRODUODENOSCOPY N/A 02/19/2017   Procedure: ESOPHAGOGASTRODUODENOSCOPY (EGD);  Surgeon: Manus Gunning, MD;  Location: [facility];  Service: Gastroenterology;  Laterality: N/A;  . ESOPHAGOGASTRODUODENOSCOPY N/A 03/08/2017   Procedure: ESOPHAGOGASTRODUODENOSCOPY (EGD);  Surgeon: Manus Gunning, MD;  Location: [facility];  Service: Gastroenterology;  Laterality: N/A;  . ESOPHAGOGASTRODUODENOSCOPY N/A 04/06/2017   Procedure: ESOPHAGOGASTRODUODENOSCOPY (EGD);  Surgeon: Manus Gunning, MD;  Location: [facility];  Service: Gastroenterology;  Laterality: N/A;  . ESOPHAGOGASTRODUODENOSCOPY (EGD) WITH PROPOFOL N/A 04/27/2017  Procedure: ESOPHAGOGASTRODUODENOSCOPY (EGD) WITH PROPOFOL;  Surgeon: Manus Gunning, MD;  Location: [facility];  Service: Gastroenterology;  Laterality: N/A;  . NM MYOVIEW LTD  10/2016     EF 55-65% with normal LV function. LOW RISK. No ischemia or infarction.  . TONSILLECTOMY       reports that she quit smoking about a year ago. Her smoking use included cigarettes. She has a 30.00 pack-year smoking history. She has never used smokeless tobacco. She reports that she does not drink alcohol or use drugs.  Allergies  Allergen Reactions  . Spiriva Handihaler [Tiotropium Bromide Monohydrate] Hives, Itching and Rash     on upper body  . Banana Itching    Family History  Problem Relation Age of Onset  . Alzheimer's disease Mother   . Diabetes Mother   . Cancer Father   . Stomach cancer Paternal Grandmother   . Colon cancer Neg Hx   . Esophageal cancer Neg Hx      Prior to Admission medications   Medication Sig Start Date End Date Taking? Authorizing Provider  acetaminophen (TYLENOL) 325 MG tablet Take 325 mg by mouth every 6 (six) hours as needed for headache (pain).   Yes [provider]  albuterol (PROVENTIL HFA;VENTOLIN HFA) 108 (90 Base) MCG/ACT inhaler Inhale 2 puffs into the lungs every 6 (six) hours as needed for wheezing or shortness of breath. 12/20/18  Yes Brand Males, MD  albuterol (PROVENTIL) (2.5 MG/3ML) 0.083% nebulizer solution USE 1 VIAL VIA NEBULIZER EVERY 8 HOURS AS NEEDED FOR WHEEZING Patient taking differently: Take 2.5 mg by nebulization every 8 (eight) hours as needed for wheezing.  12/25/19  Yes Brand Males, MD  Aloe-Sodium Chloride (AYR SALINE NASAL GEL NA) Place 1 application into the nose 6 (six) times daily.   Yes [provider]  furosemide (LASIX) 40 MG tablet Take 1 tablet (40 mg total) by mouth daily. 06/23/19 06/17/20 Yes Kilroy, Doreene Burke, PA-C  hydroxypropyl methylcellulose / hypromellose (ISOPTO TEARS / GONIOVISC) 2.5 % ophthalmic solution Place 2 drops into both eyes 3 (three) times daily as needed for dry eyes.   Yes [provider]  ibuprofen (ADVIL) 200 MG tablet Take 400 mg by mouth every 6 (six) hours as  needed (pain).   Yes [provider]  INCRUSE ELLIPTA 62.5 MCG/INH AEPB USE 1 INHALATION BY MOUTH  DAILY Patient taking differently: Inhale 1 puff into the lungs every evening.  11/14/19  Yes Brand Males, MD  magnesium citrate SOLN Take by mouth daily as needed for severe constipation (constipation).   Yes [provider]  OXYGEN Inhale 3 L into the lungs continuous.    Yes [provider]  sodium chloride (AYR) 0.65 % nasal spray Place 1 spray into the nose 6 (six) times daily.   Yes [provider]  SYMBICORT 160-4.5 MCG/ACT inhaler USE 2 PUFFS BY MOUTH TWO  TIMES DAILY Patient taking differently: Inhale 2 puffs into the lungs 2 (two) times daily.  11/14/19  Yes Brand Males, MD  UNKNOWN TO PATIENT Place into the right eye See admin instructions. Eye injection (name unknown to patient): Inject into the right eye every 7-8 weeks for wet macular degeneration   Yes [provider]    Physical Exam: Vitals:   01/22/20 2045 01/22/20 2050 01/22/20 2201 01/22/20 2215  BP:   139/74 (!) 141/68  Pulse: (!) 105 (!) 101 (!) 105 99  Resp: 19 20 (!) 22 20  Temp:  TempSrc:      SpO2: 100% 100% 100% 96%  Weight:      Height:        Constitutional: NAD, calm  Eyes: PERTLA, lids and conjunctivae normal ENMT: Mucous membranes are moist. Posterior pharynx clear of any exudate or lesions.   Neck: normal, supple, no masses, no thyromegaly Respiratory: no wheezing, no crackles. No accessory muscle use.  Cardiovascular: S1 & S2 heard, regular rate and rhythm. Left leg edema.   Abdomen: No distension, no tenderness, soft. Bowel sounds active.  Musculoskeletal: no clubbing / cyanosis. No joint deformity upper and lower extremities.   Skin: Skin tear at dorsal left foot and left knee with surrounding redness, swelling, ecchymosis, soft compartments. Otherwise warm, dry, well-perfused. Neurologic: No facial asymmetry. Sensation intact. Moving all  extremities.  Psychiatric: Alert and oriented, appropriate throughout interview and exam. Pleasant and cooperative.    Labs and Imaging on Admission: I have personally reviewed following labs and imaging studies  CBC: Recent Labs  Lab 01/22/20 1713  WBC 13.4*  NEUTROABS 10.7*  HGB 11.2*  HCT 35.9*  MCV 97.3  PLT 123XX123*   Basic Metabolic Panel: Recent Labs  Lab 01/22/20 1713  NA 142  K 3.7  CL 107  CO2 27  GLUCOSE 119*  BUN 26*  CREATININE 0.65  CALCIUM 8.8*   GFR: Estimated Creatinine Clearance: 44.4 mL/min (by C-G formula based on SCr of 0.65 mg/dL). Liver Function Tests: Recent Labs  Lab 01/22/20 1713  AST 15  ALT 11  ALKPHOS 64  BILITOT 0.9  PROT 6.4*  ALBUMIN 3.3*   No results for input(s): LIPASE, AMYLASE in the last 168 hours. No results for input(s): AMMONIA in the last 168 hours. Coagulation Profile: No results for input(s): INR, PROTIME in the last 168 hours. Cardiac Enzymes: No results for input(s): CKTOTAL, CKMB, CKMBINDEX, TROPONINI in the last 168 hours. BNP (last 3 results) Recent Labs    06/23/19 1216  PROBNP 167   HbA1C: No results for input(s): HGBA1C in the last 72 hours. CBG: No results for input(s): GLUCAP in the last 168 hours. Lipid Profile: No results for input(s): CHOL, HDL, LDLCALC, TRIG, CHOLHDL, LDLDIRECT in the last 72 hours. Thyroid Function Tests: No results for input(s): TSH, T4TOTAL, FREET4, T3FREE, THYROIDAB in the last 72 hours. Anemia Panel: No results for input(s): VITAMINB12, FOLATE, FERRITIN, TIBC, IRON, RETICCTPCT in the last 72 hours. Urine analysis:    Component Value Date/Time   COLORURINE YELLOW 04/13/2017 1215   APPEARANCEUR HAZY (A) 04/13/2017 1215   LABSPEC 1.025 04/13/2017 1215   PHURINE 5.0 04/13/2017 1215   GLUCOSEU NEGATIVE 04/13/2017 1215   HGBUR SMALL (A) 04/13/2017 1215   BILIRUBINUR NEGATIVE 04/13/2017 1215   KETONESUR 20 (A) 04/13/2017 1215   PROTEINUR 30 (A) 04/13/2017 1215    UROBILINOGEN 1.0 03/29/2014 1032   NITRITE NEGATIVE 04/13/2017 1215   LEUKOCYTESUR NEGATIVE 04/13/2017 1215   Sepsis Labs: @LABRCNTIP (procalcitonin:4,lacticidven:4) )No results found for this or any previous visit (from the past 240 hour(s)).   Radiological Exams on Admission: CT TIBIA FIBULA LEFT WO CONTRAST  Result Date: 01/22/2020 CLINICAL DATA:  Pain and swelling status post fall. EXAM: CT OF THE LEFT TIBIA AND FIBULA, LEFT FOOT, AND LEFT ANKLE TECHNIQUE: Multidetector CT imaging of the lower left extremity was performed according to the standard protocol. COMPARISON:  None. FINDINGS: Bones/Joint/Cartilage There is no acute displaced fracture.  There is an os trigonum. Ligaments Suboptimally assessed by CT. Muscles and Tendons No acute muscular abnormality. Soft  tissues There is extensive soft tissue swelling about the foot with a moderate-sized hematoma involving the lateral aspect of the midfoot. This hematoma measures approximately 7.2 x 2.4 cm. Vascular calcifications are noted and likely explains the finding seen on the patient's recent foot radiograph. There is soft tissue swelling about the left lower extremity with some overlying skin thickening. IMPRESSION: 1. No acute fracture or dislocation. Curvilinear density seen on the patient's recent foot radiograph is related to vascular calcifications. 2. Extensive soft tissue swelling about the foot with a moderate-sized hematoma involving the lateral aspect of the midfoot. Electronically Signed   By: Constance Holster M.D.   On: 01/22/2020 22:29   CT FOOT LEFT WO CONTRAST  Result Date: 01/22/2020 CLINICAL DATA:  Pain and swelling status post fall. EXAM: CT OF THE LEFT TIBIA AND FIBULA, LEFT FOOT, AND LEFT ANKLE TECHNIQUE: Multidetector CT imaging of the lower left extremity was performed according to the standard protocol. COMPARISON:  None. FINDINGS: Bones/Joint/Cartilage There is no acute displaced fracture.  There is an os trigonum.  Ligaments Suboptimally assessed by CT. Muscles and Tendons No acute muscular abnormality. Soft tissues There is extensive soft tissue swelling about the foot with a moderate-sized hematoma involving the lateral aspect of the midfoot. This hematoma measures approximately 7.2 x 2.4 cm. Vascular calcifications are noted and likely explains the finding seen on the patient's recent foot radiograph. There is soft tissue swelling about the left lower extremity with some overlying skin thickening. IMPRESSION: 1. No acute fracture or dislocation. Curvilinear density seen on the patient's recent foot radiograph is related to vascular calcifications. 2. Extensive soft tissue swelling about the foot with a moderate-sized hematoma involving the lateral aspect of the midfoot. Electronically Signed   By: Constance Holster M.D.   On: 01/22/2020 22:29   CT ANKLE LEFT WO CONTRAST  Result Date: 01/22/2020 CLINICAL DATA:  Pain and swelling status post fall. EXAM: CT OF THE LEFT TIBIA AND FIBULA, LEFT FOOT, AND LEFT ANKLE TECHNIQUE: Multidetector CT imaging of the lower left extremity was performed according to the standard protocol. COMPARISON:  None. FINDINGS: Bones/Joint/Cartilage There is no acute displaced fracture.  There is an os trigonum. Ligaments Suboptimally assessed by CT. Muscles and Tendons No acute muscular abnormality. Soft tissues There is extensive soft tissue swelling about the foot with a moderate-sized hematoma involving the lateral aspect of the midfoot. This hematoma measures approximately 7.2 x 2.4 cm. Vascular calcifications are noted and likely explains the finding seen on the patient's recent foot radiograph. There is soft tissue swelling about the left lower extremity with some overlying skin thickening. IMPRESSION: 1. No acute fracture or dislocation. Curvilinear density seen on the patient's recent foot radiograph is related to vascular calcifications. 2. Extensive soft tissue swelling about the foot  with a moderate-sized hematoma involving the lateral aspect of the midfoot. Electronically Signed   By: Constance Holster M.D.   On: 01/22/2020 22:29   DG Foot Complete Left  Result Date: 01/22/2020 CLINICAL DATA:  Fall several days ago with foot pain, initial encounter EXAM: LEFT FOOT - COMPLETE 3+ VIEW COMPARISON:  01/17/2020 FINDINGS: Considerable soft tissue swelling is noted. There is a linear density noted along the base of the metatarsals on the lateral film only which may represent a small avulsion fracture from 1 of the metatarsal bases. No other fracture is seen. No other soft tissue abnormality is noted. IMPRESSION: Considerable soft tissue swelling. Changes suspicious for an avulsion at the base of the metatarsals best  seen on the lateral projection. Electronically Signed   By: Inez Catalina M.D.   On: 01/22/2020 18:00    Assessment/Plan  1. Cellulitis left lower extremity  - Presents with progressive pain, swelling, and redness involving lower left leg since a fall with skin tears on 01/16/20  - There is no gas, abscess, or underlying fracture on CT in ED  - Blood cultures were collected in ED and vancomycin was started  - She had Tdap update by PCP recently  - Continue antibiotics with Rocephin, follow cultures and clinical response to treatment    2. COPD; chronic hypoxic respiratory failure  - Stable, continue supplemental O2, Incruse, ICS/LABA, and as-needed albuterol   3. Chronic bilateral leg edema  - She was started on Lasix for this recently, then seen by cardiology with echocardiogram that demonstrated normal LV EF, normal diastolic parameters, and no significant RV failure  - Hold Lasix, elevate legs while at rest    DVT prophylaxis: SCDs Code Status: Full  Family Communication: Discussed with patient  Disposition Plan: Likely back home in 24-48 hours  Consults called: None  Admission status: Observation     Vianne Bulls, MD [facility] Pager: See  [email or web address]  If 7AM-7PM, please contact the daytime attending [email or web address]  01/22/2020, 11:06 PM

## 2020-01-22 NOTE — ED Triage Notes (Signed)
Pt reports she fell down her stairs Tuesday night has since developed bruising and edema to LLE, bruising and small lac to her Left knee.

## 2020-01-23 DIAGNOSIS — L0390 Cellulitis, unspecified: Secondary | ICD-10-CM | POA: Diagnosis not present

## 2020-01-23 DIAGNOSIS — R2681 Unsteadiness on feet: Secondary | ICD-10-CM | POA: Diagnosis not present

## 2020-01-23 DIAGNOSIS — J449 Chronic obstructive pulmonary disease, unspecified: Secondary | ICD-10-CM | POA: Diagnosis present

## 2020-01-23 DIAGNOSIS — M6281 Muscle weakness (generalized): Secondary | ICD-10-CM | POA: Diagnosis not present

## 2020-01-23 DIAGNOSIS — M2550 Pain in unspecified joint: Secondary | ICD-10-CM | POA: Diagnosis not present

## 2020-01-23 DIAGNOSIS — Z7401 Bed confinement status: Secondary | ICD-10-CM | POA: Diagnosis not present

## 2020-01-23 DIAGNOSIS — I509 Heart failure, unspecified: Secondary | ICD-10-CM | POA: Diagnosis not present

## 2020-01-23 DIAGNOSIS — R7989 Other specified abnormal findings of blood chemistry: Secondary | ICD-10-CM | POA: Diagnosis present

## 2020-01-23 DIAGNOSIS — R2689 Other abnormalities of gait and mobility: Secondary | ICD-10-CM | POA: Diagnosis not present

## 2020-01-23 DIAGNOSIS — I959 Hypotension, unspecified: Secondary | ICD-10-CM | POA: Diagnosis not present

## 2020-01-23 DIAGNOSIS — Z820 Family history of epilepsy and other diseases of the nervous system: Secondary | ICD-10-CM | POA: Diagnosis not present

## 2020-01-23 DIAGNOSIS — E785 Hyperlipidemia, unspecified: Secondary | ICD-10-CM | POA: Diagnosis present

## 2020-01-23 DIAGNOSIS — L03116 Cellulitis of left lower limb: Principal | ICD-10-CM

## 2020-01-23 DIAGNOSIS — R944 Abnormal results of kidney function studies: Secondary | ICD-10-CM | POA: Diagnosis present

## 2020-01-23 DIAGNOSIS — I872 Venous insufficiency (chronic) (peripheral): Secondary | ICD-10-CM | POA: Diagnosis not present

## 2020-01-23 DIAGNOSIS — Z87891 Personal history of nicotine dependence: Secondary | ICD-10-CM | POA: Diagnosis not present

## 2020-01-23 DIAGNOSIS — I5033 Acute on chronic diastolic (congestive) heart failure: Secondary | ICD-10-CM | POA: Diagnosis present

## 2020-01-23 DIAGNOSIS — Z9181 History of falling: Secondary | ICD-10-CM | POA: Diagnosis not present

## 2020-01-23 DIAGNOSIS — Z800 Family history of malignant neoplasm of digestive organs: Secondary | ICD-10-CM | POA: Diagnosis not present

## 2020-01-23 DIAGNOSIS — J9611 Chronic respiratory failure with hypoxia: Secondary | ICD-10-CM | POA: Diagnosis present

## 2020-01-23 DIAGNOSIS — Z888 Allergy status to other drugs, medicaments and biological substances status: Secondary | ICD-10-CM | POA: Diagnosis not present

## 2020-01-23 DIAGNOSIS — R262 Difficulty in walking, not elsewhere classified: Secondary | ICD-10-CM | POA: Diagnosis present

## 2020-01-23 DIAGNOSIS — I5021 Acute systolic (congestive) heart failure: Secondary | ICD-10-CM | POA: Diagnosis not present

## 2020-01-23 DIAGNOSIS — Z9119 Patient's noncompliance with other medical treatment and regimen: Secondary | ICD-10-CM | POA: Diagnosis not present

## 2020-01-23 DIAGNOSIS — E876 Hypokalemia: Secondary | ICD-10-CM | POA: Diagnosis present

## 2020-01-23 DIAGNOSIS — R278 Other lack of coordination: Secondary | ICD-10-CM | POA: Diagnosis not present

## 2020-01-23 DIAGNOSIS — Z20822 Contact with and (suspected) exposure to covid-19: Secondary | ICD-10-CM | POA: Diagnosis present

## 2020-01-23 DIAGNOSIS — H8110 Benign paroxysmal vertigo, unspecified ear: Secondary | ICD-10-CM | POA: Diagnosis present

## 2020-01-23 DIAGNOSIS — I2510 Atherosclerotic heart disease of native coronary artery without angina pectoris: Secondary | ICD-10-CM | POA: Diagnosis present

## 2020-01-23 DIAGNOSIS — Z79899 Other long term (current) drug therapy: Secondary | ICD-10-CM | POA: Diagnosis not present

## 2020-01-23 DIAGNOSIS — Z87442 Personal history of urinary calculi: Secondary | ICD-10-CM | POA: Diagnosis not present

## 2020-01-23 DIAGNOSIS — Z9981 Dependence on supplemental oxygen: Secondary | ICD-10-CM | POA: Diagnosis not present

## 2020-01-23 DIAGNOSIS — Z833 Family history of diabetes mellitus: Secondary | ICD-10-CM | POA: Diagnosis not present

## 2020-01-23 DIAGNOSIS — Z7951 Long term (current) use of inhaled steroids: Secondary | ICD-10-CM | POA: Diagnosis not present

## 2020-01-23 LAB — BASIC METABOLIC PANEL
Anion gap: 11 (ref 5–15)
BUN: 24 mg/dL — ABNORMAL HIGH (ref 8–23)
CO2: 27 mmol/L (ref 22–32)
Calcium: 8.6 mg/dL — ABNORMAL LOW (ref 8.9–10.3)
Chloride: 103 mmol/L (ref 98–111)
Creatinine, Ser: 0.66 mg/dL (ref 0.44–1.00)
GFR calc Af Amer: >60 mL/min (ref >60)
GFR calc non Af Amer: >60 mL/min (ref >60)
Glucose, Bld: 98 mg/dL (ref 70–99)
Potassium: 3.4 mmol/L — ABNORMAL LOW (ref 3.5–5.1)
Sodium: 141 mmol/L (ref 135–145)

## 2020-01-23 LAB — CBC WITH DIFFERENTIAL/PLATELET
Abs Immature Granulocytes: 0.05 10*3/uL (ref 0.00–0.07)
Basophils Absolute: 0 10*3/uL (ref 0.0–0.1)
Basophils Relative: 0 %
Eosinophils Absolute: 0.3 10*3/uL (ref 0.0–0.5)
Eosinophils Relative: 3 %
HCT: 30.8 % — ABNORMAL LOW (ref 36.0–46.0)
Hemoglobin: 9.6 g/dL — ABNORMAL LOW (ref 12.0–15.0)
Immature Granulocytes: 0 %
Lymphocytes Relative: 12 %
Lymphs Abs: 1.6 10*3/uL (ref 0.7–4.0)
MCH: 30.4 pg (ref 26.0–34.0)
MCHC: 31.2 g/dL (ref 30.0–36.0)
MCV: 97.5 fL (ref 80.0–100.0)
Monocytes Absolute: 1.2 10*3/uL — ABNORMAL HIGH (ref 0.1–1.0)
Monocytes Relative: 9 %
Neutro Abs: 9.9 10*3/uL — ABNORMAL HIGH (ref 1.7–7.7)
Neutrophils Relative %: 76 %
Platelets: 398 10*3/uL (ref 150–400)
RBC: 3.16 MIL/uL — ABNORMAL LOW (ref 3.87–5.11)
RDW: 13.7 % (ref 11.5–15.5)
WBC: 13.2 10*3/uL — ABNORMAL HIGH (ref 4.0–10.5)
nRBC: 0 % (ref 0.0–0.2)

## 2020-01-23 LAB — MRSA PCR SCREENING: MRSA by PCR: NEGATIVE

## 2020-01-23 LAB — SARS CORONAVIRUS 2 (TAT 6-24 HRS): SARS Coronavirus 2: NEGATIVE

## 2020-01-23 MED ORDER — ALBUTEROL SULFATE (2.5 MG/3ML) 0.083% IN NEBU
3.0000 mL | INHALATION_SOLUTION | Freq: Four times a day (QID) | RESPIRATORY_TRACT | Status: DC | PRN
  Administered 2020-01-23 (×2): 3 mL via RESPIRATORY_TRACT
  Filled 2020-01-23 (×2): qty 3

## 2020-01-23 MED ORDER — POTASSIUM CHLORIDE CRYS ER 20 MEQ PO TBCR
40.0000 meq | EXTENDED_RELEASE_TABLET | Freq: Once | ORAL | Status: AC
  Administered 2020-01-23: 09:00:00 40 meq via ORAL
  Filled 2020-01-23: qty 2

## 2020-01-23 MED ORDER — UMECLIDINIUM BROMIDE 62.5 MCG/INH IN AEPB
1.0000 | INHALATION_SPRAY | Freq: Every day | RESPIRATORY_TRACT | Status: DC
  Administered 2020-01-23 – 2020-01-26 (×4): 1 via RESPIRATORY_TRACT
  Filled 2020-01-23: qty 7

## 2020-01-23 MED ORDER — ENOXAPARIN SODIUM 40 MG/0.4ML ~~LOC~~ SOLN
40.0000 mg | SUBCUTANEOUS | Status: DC
  Administered 2020-01-23 – 2020-01-26 (×4): 40 mg via SUBCUTANEOUS
  Filled 2020-01-23 (×5): qty 0.4

## 2020-01-23 NOTE — Progress Notes (Signed)
ANTICOAGULATION CONSULT NOTE - Initial Consult  Pharmacy Consult for Lovenox  Indication: VTE prophylaxis  Allergies  Allergen Reactions  . Spiriva Handihaler [Tiotropium Bromide Monohydrate] Hives, Itching and Rash     on upper body  . Banana Itching    Patient Measurements: Height: 5\' 2"  (157.5 cm) Weight: 130 lb (59 kg) IBW/kg (Calculated) : 50.1   Vital Signs: Temp: 98.2 F (36.8 C) (03/09 0220) Temp Source: Oral (03/09 0220) BP: 149/100 (03/09 0220) Pulse Rate: 105 (03/09 0220)  Labs: Recent Labs    01/22/20 1713 01/23/20 0537  HGB 11.2* 9.6*  HCT 35.9* 30.8*  PLT 483* 398  CREATININE 0.65 0.66    Estimated Creatinine Clearance: 44.4 mL/min (by C-G formula based on SCr of 0.66 mg/dL).   Medical History: Past Medical History:  Diagnosis Date  . Anemia age 47  . Cellulitis and abscess of foot 03/2017   rt foot  . Colon polyps    2010   . COPD (chronic obstructive pulmonary disease) ([place])   . Diverticulosis   . History of hiatal hernia   . History of kidney stones   . Hyperlipidemia   . On home oxygen therapy     Medications:  Medications Prior to Admission  Medication Sig Dispense Refill Last Dose  . acetaminophen (TYLENOL) 325 MG tablet Take 325 mg by mouth every 6 (six) hours as needed for headache (pain).   unknown  . albuterol (PROVENTIL HFA;VENTOLIN HFA) 108 (90 Base) MCG/ACT inhaler Inhale 2 puffs into the lungs every 6 (six) hours as needed for wheezing or shortness of breath. 3 Inhaler 3 01/21/2020 at Unknown time  . albuterol (PROVENTIL) (2.5 MG/3ML) 0.083% nebulizer solution USE 1 VIAL VIA NEBULIZER EVERY 8 HOURS AS NEEDED FOR WHEEZING (Patient taking differently: Take 2.5 mg by nebulization every 8 (eight) hours as needed for wheezing. ) 270 mL 2 01/21/2020 at Unknown time  . Aloe-Sodium Chloride (AYR SALINE NASAL GEL NA) Place 1 application into the nose 6 (six) times daily.   01/21/2020 at Unknown time  . furosemide (LASIX) 40 MG tablet Take 1  tablet (40 mg total) by mouth daily. 90 tablet 3 01/16/2020  . hydroxypropyl methylcellulose / hypromellose (ISOPTO TEARS / GONIOVISC) 2.5 % ophthalmic solution Place 2 drops into both eyes 3 (three) times daily as needed for dry eyes.   few weeks ago  . ibuprofen (ADVIL) 200 MG tablet Take 400 mg by mouth every 6 (six) hours as needed (pain).   01/21/2020 at Unknown time  . INCRUSE ELLIPTA 62.5 MCG/INH AEPB USE 1 INHALATION BY MOUTH  DAILY (Patient taking differently: Inhale 1 puff into the lungs every evening. ) 90 each 3 01/21/2020 at pm  . magnesium citrate SOLN Take by mouth daily as needed for severe constipation (constipation).   week ago  . OXYGEN Inhale 3 L into the lungs continuous.    01/22/2020 at Unknown time  . sodium chloride (AYR) 0.65 % nasal spray Place 1 spray into the nose 6 (six) times daily.   01/21/2020 at Unknown time  . SYMBICORT 160-4.5 MCG/ACT inhaler USE 2 PUFFS BY MOUTH TWO  TIMES DAILY (Patient taking differently: Inhale 2 puffs into the lungs 2 (two) times daily. ) 30.6 g 3 01/22/2020 at am  . UNKNOWN TO PATIENT Place into the right eye See admin instructions. Eye injection (name unknown to patient): Inject into the right eye every 7-8 weeks for wet macular degeneration   2-3 weeks ago    Assessment: 79 y.o  female admitted 01/22/20 with cellulitis of LLE.   Pharmacy consulted to dose lovenox for VTE prophylaxis.  Patient not on anticoagulation PTA.  SCr 0.66,  Hgb 11.2>9.6, pltc 483>398  weight 59 kg,  CrCl 44 ml/min     Plan:  Lovenox 40 mg SQ q24 hour Pharmacy will sign off.  Thank you for allowing pharmacy to be part of this patients care team. Nicole Cella, RPh Clinical Pharmacist Please check AMION for all [facility] phone numbers After 10:00 PM, call [facility] [phone] 01/23/2020,12:03 PM

## 2020-01-23 NOTE — Consult Note (Signed)
[facility] Nurse Consult Note: Patient receiving care in [place].  Consult completed remotely after review of record, including images of skin tear to LLE. Reason for Consult: "Left lower extremity open wound" Wound type: skin tear from fall prior hospitalization Pressure Injury POA: Yes/No/NA Measurement: Wound bed: see images of wound Drainage (amount, consistency, odor)  Periwound: bruised, edematous Dressing procedure/placement/frequency:  Gently cleanse the skin tear on the LLE with saline.  Try to place the skin flap over as much of the wound as possible, using a cotton tipped applicator.  Place as many Xeroform gauzes Kellie Simmering [phone]) as necessary to cover the skin tear area.  Place an ABD pad over the Xeroform.  Wrap kerlex around the dressings and secure them in place.  Do NOT put tape on the skin.  Place tape over the kerlex. I have also added an order to elevate the LLE on at least 2 pillows. Monitor the wound area(s) for worsening of condition such as: Signs/symptoms of infection,  Increase in size,  Development of or worsening of odor, Development of pain, or increased pain at the affected locations.  Notify the medical team if any of these develop.  Thank you for the consult.  [facility] nurse will not follow at this time.  Please re-consult the [facility] team if needed.  Val Riles, RN, MSN, CWOCN, CNS-BC, pager [phone]

## 2020-01-23 NOTE — Progress Notes (Signed)
PROGRESS NOTE    Valerie Gay  [id number] DOB: [birth year] DOA: 01/22/2020 PCP: Kathyrn Lass, MD   Brief Narrative:  HPI: Valerie Gay is a 79 y.o. female with medical history significant for COPD with chronic hypoxic respiratory failure, lower extremity swelling, and now presenting to the emergency department for evaluation of worsening left lower extremity swelling and redness.  Patient reports that she fell on some stairs on 01/16/2020, suffered some abrasions to the left foot and knee, was seen for this by her outpatient physician with negative radiographs, had her tetanus immunization updated, and was given a compression wrap.  Since that time, she has had progressive swelling, redness, and pain involving the left foot and knee.  She has had bilateral lower extremity swelling for several months for which she was started on Lasix by her PCP and evaluated by cardiology and had echocardiogram with normal EF, normal diastolic function, and no significant RV dysfunction.  She denies any fevers or chills, denies cough, denies any worsening in her chronic dyspnea, and reports that she has received both doses of the COVID-19 vaccine.  ED Course: Upon arrival to the ED, patient is found to be afebrile, saturating well on 2 L/min of supplemental oxygen, and with stable blood pressure.  Chemistry panel is notable for an elevated BUN to creatinine ratio and CBC features a leukocytosis to 13,400 and mild thrombocytosis.  Lactic acid is reassuringly normal.  Radiographs of the left foot demonstrate soft tissue swelling and question of possible avulsion fracture.  CT of the left knee, tibia/fibula, and foot are negative for acute fracture or dislocation and negative for avulsion fracture as questioned on the plain films.  Blood cultures were collected in the ED, vancomycin was started, and COVID-19 screening test was ordered but not yet collected.  Assessment & Plan:   Principal Problem:  Cellulitis of left lower extremity Active Problems:   COPD (chronic obstructive pulmonary disease) (HCC)   Chronic respiratory failure with hypoxia (HCC)   CAD (coronary artery disease)   Bilateral edema of lower extremity   1. Cellulitis left lower extremity  - Presents with progressive pain, swelling, and redness involving lower left leg since a fall with skin tears on 01/16/20  - There is no gas, abscess, or underlying fracture on CT in ED  - Blood cultures were collected in ED and vancomycin was started  - She had Tdap update by PCP recently  -She has open ulcer and surrounding erythema.  Leukocytosis stable.  She is afebrile.  We will continue Rocephin.  Follow culture and tailor antibiotics.  Consult wound care today.  2. COPD; chronic hypoxic respiratory failure  - Stable, continue supplemental O2 (she uses 3 L nasal oxygen at home), Incruse, ICS/LABA, and as-needed albuterol   3. Chronic bilateral leg edema  - She was started on Lasix for this recently, then seen by cardiology with echocardiogram that demonstrated normal LV EF, normal diastolic parameters, and no significant RV failure.  Right lower extremity edema has completely resolved.  She still has left lower extremity edema.  Holding Lasix.  Elevate legs.  4-generalized weakness.  Will consult PT OT.  5.  Hypokalemia.  Will replace orally.  Recheck in the morning.  DVT prophylaxis: Lovenox   Code Status: Full Code  Family Communication:  None present at bedside.  Plan of care discussed with patient in length and he verbalized understanding and agreed with it. Patient is from: Home Disposition Plan: Home once improved. Barriers to discharge:  Persistent cellulitis requiring IV antibiotics and assessment by therapy pending.   Estimated body mass index is 23.78 kg/m as calculated from the following:   Height as of this encounter: 5\' 2"  (1.575 m).   Weight as of this encounter: 59 kg.      Nutritional status:                 Consultants:   None  Procedures:   None  Antimicrobials:   Rocephin   Subjective: Seen and examined.  Complains of left lower extremity pain.  No other complaint.  Objective: Vitals:   01/22/20 2215 01/23/20 0000 01/23/20 0100 01/23/20 0220  BP: (!) 141/68 (!) 130/54 (!) 146/69 (!) 149/100  Pulse: 99 96 97 (!) 105  Resp: 20 (!) 22 19   Temp:    98.2 F (36.8 C)  TempSrc:    Oral  SpO2: 96% 100% 96% 100%  Weight:      Height:        Intake/Output Summary (Last 24 hours) at 01/23/2020 1122 Last data filed at 01/23/2020 0300 Gross per 24 hour  Intake 101.38 ml  Output --  Net 101.38 ml   Filed Weights   01/22/20 1703  Weight: 59 kg    Examination:  General exam: Appears calm and comfortable  Respiratory system: Clear to auscultation. Respiratory effort normal. Cardiovascular system: S1 & S2 heard, RRR. No JVD, murmurs, rubs, gallops or clicks.  +2 pitting edema left lower extremity. Gastrointestinal system: Abdomen is nondistended, soft and nontender. No organomegaly or masses felt. Normal bowel sounds heard. Central nervous system: Alert and oriented. No focal neurological deficits. Extremities: Symmetric 5 x 5 power. Skin: Multiple bruises in bilateral lower extremities, open wound in the left lower extremity on dorsum of the foot with surrounding scattered erythema Psychiatry: Judgement and insight appear normal. Mood & affect appropriate.           Data Reviewed: I have personally reviewed following labs and imaging studies  CBC: Recent Labs  Lab 01/22/20 1713 01/23/20 0537  WBC 13.4* 13.2*  NEUTROABS 10.7* 9.9*  HGB 11.2* 9.6*  HCT 35.9* 30.8*  MCV 97.3 97.5  PLT 483* [id number]   Basic Metabolic Panel: Recent Labs  Lab 01/22/20 1713 01/23/20 0537  NA 142 141  K 3.7 3.4*  CL 107 103  CO2 27 27  GLUCOSE 119* 98  BUN 26* 24*  CREATININE 0.65 0.66  CALCIUM 8.8* 8.6*   GFR: Estimated Creatinine Clearance: 44.4 mL/min  (by C-G formula based on SCr of 0.66 mg/dL). Liver Function Tests: Recent Labs  Lab 01/22/20 1713  AST 15  ALT 11  ALKPHOS 64  BILITOT 0.9  PROT 6.4*  ALBUMIN 3.3*   No results for input(s): LIPASE, AMYLASE in the last 168 hours. No results for input(s): AMMONIA in the last 168 hours. Coagulation Profile: No results for input(s): INR, PROTIME in the last 168 hours. Cardiac Enzymes: No results for input(s): CKTOTAL, CKMB, CKMBINDEX, TROPONINI in the last 168 hours. BNP (last 3 results) Recent Labs    06/23/19 1216  PROBNP 167   HbA1C: No results for input(s): HGBA1C in the last 72 hours. CBG: No results for input(s): GLUCAP in the last 168 hours. Lipid Profile: No results for input(s): CHOL, HDL, LDLCALC, TRIG, CHOLHDL, LDLDIRECT in the last 72 hours. Thyroid Function Tests: No results for input(s): TSH, T4TOTAL, FREET4, T3FREE, THYROIDAB in the last 72 hours. Anemia Panel: No results for input(s): VITAMINB12, FOLATE, FERRITIN, TIBC, IRON, RETICCTPCT in  the last 72 hours. Sepsis Labs: Recent Labs  Lab 01/22/20 2010  LATICACIDVEN 1.2    Recent Results (from the past 240 hour(s))  SARS CORONAVIRUS 2 (TAT 6-24 HRS) Nasopharyngeal Nasopharyngeal Swab     Status: None   Collection Time: 01/22/20  7:45 PM   Specimen: Nasopharyngeal Swab  Result Value Ref Range Status   SARS Coronavirus 2 NEGATIVE NEGATIVE Final    Comment: (NOTE) SARS-CoV-2 target nucleic acids are NOT DETECTED. The SARS-CoV-2 RNA is generally detectable in upper and lower respiratory specimens during the acute phase of infection. Negative results do not preclude SARS-CoV-2 infection, do not rule out co-infections with other pathogens, and should not be used as the sole basis for treatment or other patient management decisions. Negative results must be combined with clinical observations, patient history, and epidemiological information. The expected result is Negative. Fact Sheet for  Patients: SugarRoll.be Fact Sheet for Healthcare Providers: https://www.woods-mathews.com/ This test is not yet approved or cleared by the [place] FDA and  has been authorized for detection and/or diagnosis of SARS-CoV-2 by FDA under an Emergency Use Authorization (EUA). This EUA will remain  in effect (meaning this test can be used) for the duration of the COVID-19 declaration under Section 56 4(b)(1) of the Act, 21 U.S.C. section 360bbb-3(b)(1), unless the authorization is terminated or revoked sooner. Performed at [facility], [place] [address] [place], [place] [address]   MRSA PCR Screening     Status: None   Collection Time: 01/22/20 11:10 PM   Specimen: Nasopharyngeal  Result Value Ref Range Status   MRSA by PCR NEGATIVE NEGATIVE Final    Comment:        The GeneXpert MRSA Assay (FDA approved for NASAL specimens only), is one component of a comprehensive MRSA colonization surveillance program. It is not intended to diagnose MRSA infection nor to guide or monitor treatment for MRSA infections. Performed at [facility], [place] [address] [place],  [address]       Radiology Studies: CT TIBIA FIBULA LEFT WO CONTRAST  Result Date: 01/22/2020 CLINICAL DATA:  Pain and swelling status post fall. EXAM: CT OF THE LEFT TIBIA AND FIBULA, LEFT FOOT, AND LEFT ANKLE TECHNIQUE: Multidetector CT imaging of the lower left extremity was performed according to the standard protocol. COMPARISON:  None. FINDINGS: Bones/Joint/Cartilage There is no acute displaced fracture.  There is an os trigonum. Ligaments Suboptimally assessed by CT. Muscles and Tendons No acute muscular abnormality. Soft tissues There is extensive soft tissue swelling about the foot with a moderate-sized hematoma involving the lateral aspect of the midfoot. This hematoma measures approximately 7.2 x 2.4 cm. Vascular calcifications are noted and likely explains the  finding seen on the patient's recent foot radiograph. There is soft tissue swelling about the left lower extremity with some overlying skin thickening. IMPRESSION: 1. No acute fracture or dislocation. Curvilinear density seen on the patient's recent foot radiograph is related to vascular calcifications. 2. Extensive soft tissue swelling about the foot with a moderate-sized hematoma involving the lateral aspect of the midfoot. Electronically Signed   By: Constance Holster M.D.   On: 01/22/2020 22:29   CT FOOT LEFT WO CONTRAST  Result Date: 01/22/2020 CLINICAL DATA:  Pain and swelling status post fall. EXAM: CT OF THE LEFT TIBIA AND FIBULA, LEFT FOOT, AND LEFT ANKLE TECHNIQUE: Multidetector CT imaging of the lower left extremity was performed according to the standard protocol. COMPARISON:  None. FINDINGS: Bones/Joint/Cartilage There is no acute displaced  fracture.  There is an os trigonum. Ligaments Suboptimally assessed by CT. Muscles and Tendons No acute muscular abnormality. Soft tissues There is extensive soft tissue swelling about the foot with a moderate-sized hematoma involving the lateral aspect of the midfoot. This hematoma measures approximately 7.2 x 2.4 cm. Vascular calcifications are noted and likely explains the finding seen on the patient's recent foot radiograph. There is soft tissue swelling about the left lower extremity with some overlying skin thickening. IMPRESSION: 1. No acute fracture or dislocation. Curvilinear density seen on the patient's recent foot radiograph is related to vascular calcifications. 2. Extensive soft tissue swelling about the foot with a moderate-sized hematoma involving the lateral aspect of the midfoot. Electronically Signed   By: Constance Holster M.D.   On: 01/22/2020 22:29   CT ANKLE LEFT WO CONTRAST  Result Date: 01/22/2020 CLINICAL DATA:  Pain and swelling status post fall. EXAM: CT OF THE LEFT TIBIA AND FIBULA, LEFT FOOT, AND LEFT ANKLE TECHNIQUE:  Multidetector CT imaging of the lower left extremity was performed according to the standard protocol. COMPARISON:  None. FINDINGS: Bones/Joint/Cartilage There is no acute displaced fracture.  There is an os trigonum. Ligaments Suboptimally assessed by CT. Muscles and Tendons No acute muscular abnormality. Soft tissues There is extensive soft tissue swelling about the foot with a moderate-sized hematoma involving the lateral aspect of the midfoot. This hematoma measures approximately 7.2 x 2.4 cm. Vascular calcifications are noted and likely explains the finding seen on the patient's recent foot radiograph. There is soft tissue swelling about the left lower extremity with some overlying skin thickening. IMPRESSION: 1. No acute fracture or dislocation. Curvilinear density seen on the patient's recent foot radiograph is related to vascular calcifications. 2. Extensive soft tissue swelling about the foot with a moderate-sized hematoma involving the lateral aspect of the midfoot. Electronically Signed   By: Constance Holster M.D.   On: 01/22/2020 22:29   DG Foot Complete Left  Result Date: 01/22/2020 CLINICAL DATA:  Fall several days ago with foot pain, initial encounter EXAM: LEFT FOOT - COMPLETE 3+ VIEW COMPARISON:  01/17/2020 FINDINGS: Considerable soft tissue swelling is noted. There is a linear density noted along the base of the metatarsals on the lateral film only which may represent a small avulsion fracture from 1 of the metatarsal bases. No other fracture is seen. No other soft tissue abnormality is noted. IMPRESSION: Considerable soft tissue swelling. Changes suspicious for an avulsion at the base of the metatarsals best seen on the lateral projection. Electronically Signed   By: Inez Catalina M.D.   On: 01/22/2020 18:00    Scheduled Meds: . mometasone-formoterol  2 puff Inhalation BID  . umeclidinium bromide  1 puff Inhalation Q2000   Continuous Infusions: . cefTRIAXone (ROCEPHIN)  IV Stopped  (01/23/20 0139)     LOS: 0 days   Time spent: 35 minutes   Darliss Cheney, MD [facility]  01/23/2020, 11:22 AM   To contact the attending provider between 7A-7P or the covering provider during after hours 7P-7A, please log into the web site [email or web address].

## 2020-01-23 NOTE — ED Notes (Signed)
First attempt to call report unsuccessful. 

## 2020-01-24 LAB — CBC WITH DIFFERENTIAL/PLATELET
Abs Immature Granulocytes: 0.05 10*3/uL (ref 0.00–0.07)
Basophils Absolute: 0 10*3/uL (ref 0.0–0.1)
Basophils Relative: 0 %
Eosinophils Absolute: 0.2 10*3/uL (ref 0.0–0.5)
Eosinophils Relative: 2 %
HCT: 32.6 % — ABNORMAL LOW (ref 36.0–46.0)
Hemoglobin: 10.2 g/dL — ABNORMAL LOW (ref 12.0–15.0)
Immature Granulocytes: 0 %
Lymphocytes Relative: 10 %
Lymphs Abs: 1.2 10*3/uL (ref 0.7–4.0)
MCH: 30.4 pg (ref 26.0–34.0)
MCHC: 31.3 g/dL (ref 30.0–36.0)
MCV: 97.3 fL (ref 80.0–100.0)
Monocytes Absolute: 1.2 10*3/uL — ABNORMAL HIGH (ref 0.1–1.0)
Monocytes Relative: 10 %
Neutro Abs: 8.9 10*3/uL — ABNORMAL HIGH (ref 1.7–7.7)
Neutrophils Relative %: 78 %
Platelets: 437 10*3/uL — ABNORMAL HIGH (ref 150–400)
RBC: 3.35 MIL/uL — ABNORMAL LOW (ref 3.87–5.11)
RDW: 13.7 % (ref 11.5–15.5)
WBC: 11.5 10*3/uL — ABNORMAL HIGH (ref 4.0–10.5)
nRBC: 0 % (ref 0.0–0.2)

## 2020-01-24 LAB — COMPREHENSIVE METABOLIC PANEL
ALT: 14 U/L (ref 0–44)
AST: 17 U/L (ref 15–41)
Albumin: 2.5 g/dL — ABNORMAL LOW (ref 3.5–5.0)
Alkaline Phosphatase: 57 U/L (ref 38–126)
Anion gap: 8 (ref 5–15)
BUN: 20 mg/dL (ref 8–23)
CO2: 27 mmol/L (ref 22–32)
Calcium: 8.7 mg/dL — ABNORMAL LOW (ref 8.9–10.3)
Chloride: 105 mmol/L (ref 98–111)
Creatinine, Ser: 0.72 mg/dL (ref 0.44–1.00)
GFR calc Af Amer: >60 mL/min (ref >60)
GFR calc non Af Amer: >60 mL/min (ref >60)
Glucose, Bld: 100 mg/dL — ABNORMAL HIGH (ref 70–99)
Potassium: 4.3 mmol/L (ref 3.5–5.1)
Sodium: 140 mmol/L (ref 135–145)
Total Bilirubin: 1.3 mg/dL — ABNORMAL HIGH (ref 0.3–1.2)
Total Protein: 5.5 g/dL — ABNORMAL LOW (ref 6.5–8.1)

## 2020-01-24 MED ORDER — DOXYCYCLINE HYCLATE 100 MG PO TABS
100.0000 mg | ORAL_TABLET | Freq: Two times a day (BID) | ORAL | Status: DC
  Administered 2020-01-24 – 2020-01-26 (×5): 100 mg via ORAL
  Filled 2020-01-24 (×5): qty 1

## 2020-01-24 NOTE — Evaluation (Signed)
Physical Therapy Evaluation Patient Details Name: Valerie Gay MRN: [id number] DOB: 1941/02/18 Today's Date: 01/24/2020   History of Present Illness  Valerie Gay is a 79 y.o. female with medical history significant for COPD with chronic hypoxic respiratory failure, lower extremity swelling, and now presenting to the emergency department for evaluation of worsening left lower extremity swelling and redness.   Clinical Impression  Pt admitted with above. Pt was at home living alone and now unable to transfer, stand, or ambulate due to inability to WB on L foot. Pt with significant pain with ROM to L foot and L foot in dependent position requiring assist for all transfers. Pt will most likely need to use w/c as primary  Mode of mobility until pain is management better. Pt unsafe to return home at this time and would benefit from ST-SNF to allow for healing and progressing of indep with mobility from w/c level and/or ambulation with RW. Acute PT to cont to follow.    Follow Up Recommendations SNF(states she's been at [facility] before)    Equipment Recommendations  (TBD at next venue)    Recommendations for Other Services       Precautions / Restrictions Precautions Precautions: Fall Precaution Comments: L foot with wound Restrictions Weight Bearing Restrictions: No(however pt self TWB on L LE)      Mobility  Bed Mobility Overal bed mobility: Needs Assistance Bed Mobility: Supine to Sit;Sit to Supine     Supine to sit: Min assist Sit to supine: Min assist   General bed mobility comments: HOB elevated, minA for L LE management due to pain and weakness  Transfers Overall transfer level: Needs assistance Equipment used: Rolling walker (2 wheeled) Transfers: Sit to/from Stand Sit to Stand: Mod assist;Max assist         General transfer comment: pt with limited L LE WBing due to pain, maxA to power up and maintain upright position during transition of  hands  Ambulation/Gait             General Gait Details: unable this date due to limited L LE WBing tolerance, nausea and L LE pain  Stairs            Wheelchair Mobility    Modified Rankin (Stroke Patients Only)       Balance Overall balance assessment: Needs assistance Sitting-balance support: Feet supported;Single extremity supported Sitting balance-Leahy Scale: Fair     Standing balance support: Bilateral upper extremity supported Standing balance-Leahy Scale: Poor Standing balance comment: dependent on RW and physical assist                             Pertinent Vitals/Pain Pain Assessment: 0-10 Pain Score: 8 (L foot with movement, 0/10 at rest) Pain Location: L foot Pain Descriptors / Indicators: Aching;Burning Pain Intervention(s): Monitored during session(pt became nauseated with onset of pain)    Home Living Family/patient expects to be discharged to:: Skilled nursing facility                 Additional Comments: pt lives alone in 1 story home with 3steps to enter.    Prior Function Level of Independence: Independent         Comments: pt wasn't using AD, drove, grocery shopped     Hand Dominance   Dominant Hand: Right    Extremity/Trunk Assessment   Upper Extremity Assessment Upper Extremity Assessment: Generalized weakness    Lower Extremity Assessment Lower  Extremity Assessment: LLE deficits/detail LLE Deficits / Details: limited L ankle ROM/wiggling of toes due to pain    Cervical / Trunk Assessment Cervical / Trunk Assessment: Kyphotic  Communication   Communication: No difficulties  Cognition Arousal/Alertness: Awake/alert Behavior During Therapy: WFL for tasks assessed/performed Overall Cognitive Status: Within Functional Limits for tasks assessed                                        General Comments General comments (skin integrity, edema, etc.): L foot with dressing on it, RN does  wound care daily on it    Exercises General Exercises - Lower Extremity Ankle Circles/Pumps: AROM;Right;10 reps;Supine(unable on L due to pain) Heel Raises: AAROM;Left;10 reps;Supine(pt able to do AROM on R LE in supine)   Assessment/Plan    PT Assessment Patient needs continued PT services  PT Problem List Decreased strength;Decreased activity tolerance;Decreased balance;Decreased mobility;Pain       PT Treatment Interventions DME instruction;Gait training;Stair training;Functional mobility training;Therapeutic activities;Therapeutic exercise;Balance training;Neuromuscular re-education    PT Goals (Current goals can be found in the Care Plan section)  Acute Rehab PT Goals Patient Stated Goal: get better PT Goal Formulation: With patient Time For Goal Achievement: 02/07/20 Potential to Achieve Goals: Good    Frequency Min 3X/week   Barriers to discharge Decreased caregiver support lives alone    Co-evaluation               AM-PAC PT "6 Clicks" Mobility  Outcome Measure Help needed turning from your back to your side while in a flat bed without using bedrails?: A Little Help needed moving from lying on your back to sitting on the side of a flat bed without using bedrails?: A Little Help needed moving to and from a bed to a chair (including a wheelchair)?: A Lot Help needed standing up from a chair using your arms (e.g., wheelchair or bedside chair)?: A Lot Help needed to walk in hospital room?: A Lot Help needed climbing 3-5 steps with a railing? : Total 6 Click Score: 13    End of Session Equipment Utilized During Treatment: Gait belt;Oxygen(on 3LO2 at home via [place]) Activity Tolerance: Patient limited by pain(and onset of nausea) Patient left: in bed;with call bell/phone within reach;with bed alarm set Nurse Communication: Mobility status(nausea and increased pain) PT Visit Diagnosis: Unsteadiness on feet (R26.81);Difficulty in walking, not elsewhere classified  (R26.2);Pain Pain - Right/Left: Left Pain - part of body: Ankle and joints of foot    Time: [id number] PT Time Calculation (min) (ACUTE ONLY): 25 min   Charges:   PT Evaluation $PT Eval Moderate Complexity: 1 Mod PT Treatments $Therapeutic Activity: 8-22 mins        Kittie Plater, PT, DPT Acute Rehabilitation Services Pager #: [phone] Office #: [phone]   Berline Lopes 01/24/2020, 12:20 PM

## 2020-01-24 NOTE — TOC Initial Note (Signed)
Transition of Care [facility]) - Initial/Assessment Note    Patient Details  Name: Valerie Gay MRN: [id number] Date of Birth: 30-Jul-1941  Transition of Care [facility]) CM/SW Contact:    Alexander Mt, LCSW Phone Number: 01/24/2020, 4:38 PM  Clinical Narrative:                 CSW spoke with pt at bedside. Introduced self, role, reason for visit. Pt from home alone; per her report she was ambulating independently with the occasional assistance of a cane/walker.   Confirmed home address and PCP. Pt has assistance from daughter occassionally. Pt has been to [facility] in the past, she is aware of what SNF is and the recommendations. Pt open to seeing her options and would like referral sent to [facility].   TOC team to f/u with options.   Expected Discharge Plan: Skilled Nursing Facility Barriers to Discharge: Continued Medical Work up  Patient Goals and CMS Choice Patient states their goals for this hospitalization and ongoing recovery are:: get stronger, be able to return home CMS Medicare.gov Compare Post Acute Care list provided to:: Patient Choice offered to / list presented to : Patient  Expected Discharge Plan and Services Expected Discharge Plan: [place] In-house Referral: Clinical Social Work Discharge Planning Services: CM Consult Post Acute Care Choice: [place] Living arrangements for the past 2 months: [place]  Prior Living Arrangements/Services Living arrangements for the past 2 months: Single Family Home Lives with:: Self Patient language and need for interpreter reviewed:: Yes Do you feel safe going back to the place where you live?: Yes      Need for Family Participation in Patient Care: Yes (Comment)(assistance with daily cares) Care giver support system in place?: Yes (comment)(pt daughter) Current home services: DME Criminal Activity/Legal Involvement Pertinent to Current Situation/Hospitalization: No -  Comment as needed  Permission Sought/Granted Permission sought to share information with : Family Supports, Chartered certified accountant granted to share information with : Yes, Verbal Permission Granted  Share Information with NAME: [place] granted to share info w AGENCY: SNFs  Permission granted to share info w Relationship: daughter  Permission granted to share info w Contact Information: [phone]  Emotional Assessment Appearance:: Appears stated age Attitude/Demeanor/Rapport: Engaged Affect (typically observed): Accepting, Adaptable, Appropriate Orientation: : Oriented to Self, Oriented to Place, Oriented to Situation, Oriented to  Time Alcohol / Substance Use: Not Applicable Psych Involvement: (n/a)  Admission diagnosis:  Cellulitis [L03.90] Cellulitis of right lower extremity [L03.115] Cellulitis of left lower extremity [L03.116] Patient Active Problem List   Diagnosis Date Noted  . Bilateral edema of lower extremity 01/22/2020  . Edema 06/23/2019  . CAD (coronary artery disease) 06/23/2019  . Chronic respiratory failure with hypoxia ([place]) 01/17/2019  . Cellulitis of left lower extremity 04/13/2017  . GERD (gastroesophageal reflux disease) 04/13/2017  . Esophageal stricture   . Dysphagia   . Abnormal barium swallow   . History of colonic polyps   . Polyp of ascending colon   . Polyp of transverse colon   . Acute URI 12/13/2016  . Coronary artery calcification 10/23/2016  . Hyperlipidemia LDL goal <100 10/23/2016  . Medication management 10/23/2016  . DOE (dyspnea on exertion) 10/23/2016  . Lung nodule 02/26/2015  . Nodule of right lung 11/28/2014  . Acute calculous cholecystitis 06/22/2014  . Acute cholecystitis 06/22/2014  . Pre-operative respiratory examination 06/22/2014  . Abnormal chest x-ray 05/28/2014  . COPD Gold II D,  still smoking 05/07/2014  . Nicotine abuse 03/27/2014  . Hypokalemia 03/27/2014  . Leukocytosis  03/27/2014  . TOBACCO USE 11/05/2008  . COPD (chronic obstructive pulmonary disease) ([place]) 11/05/2008  . HYPERGLYCEMIA 11/05/2008  . ANEMIA-NOS 07/21/2007   PCP:  Kathyrn Lass, MD Pharmacy:   [facility], [place] [address] Phone: [phone] Fax: [place], [place] [address] [place] [address] Phone: [phone] Fax: [place], [place] [facility] [address] [place] [address] [place] [address] Phone: [phone] Fax: [phone]  Readmission Risk Interventions Readmission Risk Prevention Plan 01/24/2020  Post Dischage Appt Not Complete  Appt Comments plan for SNF  Medication Screening Complete  Transportation Screening Complete  Some recent data might be hidden

## 2020-01-24 NOTE — NC FL2 (Signed)
[place] LEVEL OF CARE SCREENING TOOL     IDENTIFICATION  Patient Name: Valerie Gay Birthdate: 1941/03/25 Sex: female Admission Date (Current Location): 01/22/2020  [facility] and [place] Number:  Herbalist and Address:  [facility], [place] [address] [place], [place] [address]      Provider Number: [id number]  Attending Physician Name and Address:  Nita Sells, MD  Relative Name and Phone Number:       Current Level of Care: SNF Recommended Level of Care: [place] Prior Approval Number:    Date Approved/Denied:   PASRR Number: [id number]  Discharge Plan: SNF    Current Diagnoses: Patient Active Problem List   Diagnosis Date Noted  . Bilateral edema of lower extremity 01/22/2020  . Edema 06/23/2019  . CAD (coronary artery disease) 06/23/2019  . Chronic respiratory failure with hypoxia ([place]) 01/17/2019  . Cellulitis of left lower extremity 04/13/2017  . GERD (gastroesophageal reflux disease) 04/13/2017  . Esophageal stricture   . Dysphagia   . Abnormal barium swallow   . History of colonic polyps   . Polyp of ascending colon   . Polyp of transverse colon   . Acute URI 12/13/2016  . Coronary artery calcification 10/23/2016  . Hyperlipidemia LDL goal <100 10/23/2016  . Medication management 10/23/2016  . DOE (dyspnea on exertion) 10/23/2016  . Lung nodule 02/26/2015  . Nodule of right lung 11/28/2014  . Acute calculous cholecystitis 06/22/2014  . Acute cholecystitis 06/22/2014  . Pre-operative respiratory examination 06/22/2014  . Abnormal chest x-ray 05/28/2014  . COPD Gold II D, still smoking 05/07/2014  . Nicotine abuse 03/27/2014  . Hypokalemia 03/27/2014  . Leukocytosis 03/27/2014  . TOBACCO USE 11/05/2008  . COPD (chronic obstructive pulmonary disease) ([place]) 11/05/2008  . HYPERGLYCEMIA 11/05/2008  . ANEMIA-NOS 07/21/2007    Orientation RESPIRATION BLADDER Height & Weight     Self, Time, Situation, Place  O2(3L nasal canula) Continent Weight: 130 lb (59 kg) Height:  5\' 2"  (157.5 cm)  BEHAVIORAL SYMPTOMS/MOOD NEUROLOGICAL BOWEL NUTRITION STATUS      Continent Diet(see discharge summary)  AMBULATORY STATUS COMMUNICATION OF NEEDS Skin   Extensive Assist Verbally Skin abrasions, Surgical wounds(abrasion on left leg, cellulitis on right leg, cracking on left foot, skin tear on left foot)                       Personal Care Assistance Level of Assistance  Bathing, Feeding, Dressing Bathing Assistance: Maximum assistance Feeding assistance: Independent Dressing Assistance: Maximum assistance     Functional Limitations Info  Hearing, Speech, Sight Sight Info: Adequate Hearing Info: Adequate Speech Info: Adequate    SPECIAL CARE FACTORS FREQUENCY  PT (By licensed PT), OT (By licensed OT)     PT Frequency: 5x week OT Frequency: 5x week            Contractures Contractures Info: Not present    Additional Factors Info  Code Status, Allergies Code Status Info: Full Code Allergies Info: Spiriva Handihaler (Tiotropium Bromide Monohydrate), Banana           Current Medications (01/24/2020):  This is the current hospital active medication list Current Facility-Administered Medications  Medication Dose Route Frequency Provider Last Rate Last Admin  . acetaminophen (TYLENOL) tablet 650 mg  650 mg Oral Q6H PRN Opyd, Ilene Qua, MD       Or  . acetaminophen (TYLENOL) suppository 650 mg  650 mg Rectal Q6H PRN Opyd,  Ilene Qua, MD      . albuterol (PROVENTIL) (2.5 MG/3ML) 0.083% nebulizer solution 3 mL  3 mL Inhalation Q6H PRN Opyd, Ilene Qua, MD   3 mL at 01/23/20 2358  . doxycycline (VIBRA-TABS) tablet 100 mg  100 mg Oral Q12H Nita Sells, MD   100 mg at 01/24/20 1341  . enoxaparin (LOVENOX) injection 40 mg  40 mg Subcutaneous Q24H Wendee Beavers, RPH   40 mg at 01/24/20 1207  . HYDROcodone-acetaminophen (NORCO/VICODIN) 5-325 MG per tablet 1-2  tablet  1-2 tablet Oral Q4H PRN Opyd, Ilene Qua, MD   2 tablet at 01/24/20 0358  . mometasone-formoterol (DULERA) 200-5 MCG/ACT inhaler 2 puff  2 puff Inhalation BID Opyd, Ilene Qua, MD   2 puff at 01/24/20 0731  . ondansetron (ZOFRAN) tablet 4 mg  4 mg Oral Q6H PRN Opyd, Ilene Qua, MD       Or  . ondansetron (ZOFRAN) injection 4 mg  4 mg Intravenous Q6H PRN Opyd, Ilene Qua, MD   4 mg at 01/24/20 1207  . polyethylene glycol (MIRALAX / GLYCOLAX) packet 17 g  17 g Oral Daily PRN Opyd, Ilene Qua, MD      . sodium chloride (OCEAN) 0.65 % nasal spray 1 spray  1 spray Each Nare PRN Opyd, Ilene Qua, MD      . umeclidinium bromide (INCRUSE ELLIPTA) 62.5 MCG/INH 1 puff  1 puff Inhalation Q2000 Opyd, Ilene Qua, MD   1 puff at 01/24/20 0732     Discharge Medications: Please see discharge summary for a list of discharge medications.  Relevant Imaging Results:  Relevant Lab Results:   Additional Information SSN: [place], [place]

## 2020-01-24 NOTE — Progress Notes (Signed)
Dressing change done on pt LLE, she tolerated well.

## 2020-01-24 NOTE — Progress Notes (Signed)
Hospitalist progress note   Patient from home, Patient going-unclear she is undecided, Dispo inpatient pending resolution of cellulitis-in addition to decision about skilled facility likely 24 to 48 hours  Valerie Gay [id number] DOB: 1940/12/01 DOA: 01/22/2020  PCP: Kathyrn Lass, MD   Narrative:  1 white female tobacco COPD stage B on chronic nasal oxygen 3 L acalculous cholecystitis status post surgery 2015 prior right foot cellulitis 04/2017 low risk Myoview 2017-EF 55-65% at that time Last hospital stay 11/2318-->12/10/18 acute superimposed on chronic hypoxic respiratory failure Represented on admission 01/22/2020 left lower extremity redness and swelling status post reported falls 01/16/2020 with abrasions-has been on Lasix last office visit with cardiology 06/23/2019 was placed on 40 instead of 39 of [place] pulmonology visit 10/19/2019 she is noncompliant regularly with her Lasix  Patient admitted secondary to falls and questionable cellulitis left lower extremity-CT scan showed no gas or abscess vancomycin started given Tdap recently  Data Reviewed:  Potassium 3.4-->4.3, BUN/creatinine 24/0.6-->0.72 LFTs normal WBC 13.2-->11.5, platelet 437  Assessment & Plan:  Left lower extremity cellulitis Appears purulent by description-rec'd Vanc--then Ceftriaxone--> doxycycline nopw Decline in white count Continue dressings on left lower extremity with Xeroform to cover skin area with ABD pad Mild acute decompensated HFpEF Clinically is euvolemic Home medication Lasix 40 daily held from admission-resume a.m. COPD still smoker 3 L oxygen Desat screen-ambulate-Dulera 2 puffs twice daily Incruse Ellipta daily Symbicort 2 puffs twice daily albuterol as needed Hypokalemia Replaced and appropriate BPPV Seems like she has had treatment for this before-patient not willing to get the treatment at this time and will let us know if she changes her mind   Subjective: Awake alert coherent but states  is having nausea today-tells me that nausea is made worse by positional changes and has been treated for what sounds like BPPV in the past as an outpatient She seems fixated on control of her nausea and has not eaten much today No chest pain no chills  Shortness of breath no cough no cold Consultants:   None  Objective: Vitals:   01/23/20 1738 01/23/20 2357 01/24/20 0611 01/24/20 0732  BP: (!) 123/49 (!) 136/51 (!) 109/54   Pulse: 76 91 92   Resp: 19 18 19    Temp: 98.8 F (37.1 C) 98.6 F (37 C) 98.2 F (36.8 C)   TempSrc: Oral Oral Oral   SpO2: 100% 96% 95% 97%  Weight:      Height:        Intake/Output Summary (Last 24 hours) at 01/24/2020 0754 Last data filed at 01/24/2020 0300 Gross per 24 hour  Intake 458 ml  Output --  Net 458 ml   Filed Weights   01/22/20 1703  Weight: 59 kg    Examination: Awake alert coherent slightly disheveled EOMI NCAT left eye has what looks like postop changes Abdomen is soft no rebound no guarding Lower extremity on left side as below  Neurologically intact moving all 4 limbs equally  Scheduled Meds: . enoxaparin (LOVENOX) injection  40 mg Subcutaneous Q24H  . mometasone-formoterol  2 puff Inhalation BID  . umeclidinium bromide  1 puff Inhalation Q2000   Continuous Infusions: . cefTRIAXone (ROCEPHIN)  IV 1 g (01/23/20 2352)     LOS: 1 day   Time spent: [place], MD [facility]  01/24/2020, 7:54 AM

## 2020-01-25 LAB — CBC WITH DIFFERENTIAL/PLATELET
Abs Immature Granulocytes: 0.06 10*3/uL (ref 0.00–0.07)
Basophils Absolute: 0 10*3/uL (ref 0.0–0.1)
Basophils Relative: 0 %
Eosinophils Absolute: 0.3 10*3/uL (ref 0.0–0.5)
Eosinophils Relative: 3 %
HCT: 32.2 % — ABNORMAL LOW (ref 36.0–46.0)
Hemoglobin: 10.1 g/dL — ABNORMAL LOW (ref 12.0–15.0)
Immature Granulocytes: 1 %
Lymphocytes Relative: 12 %
Lymphs Abs: 1.4 10*3/uL (ref 0.7–4.0)
MCH: 30.3 pg (ref 26.0–34.0)
MCHC: 31.4 g/dL (ref 30.0–36.0)
MCV: 96.7 fL (ref 80.0–100.0)
Monocytes Absolute: 1.1 10*3/uL — ABNORMAL HIGH (ref 0.1–1.0)
Monocytes Relative: 10 %
Neutro Abs: 8.6 10*3/uL — ABNORMAL HIGH (ref 1.7–7.7)
Neutrophils Relative %: 74 %
Platelets: 438 10*3/uL — ABNORMAL HIGH (ref 150–400)
RBC: 3.33 MIL/uL — ABNORMAL LOW (ref 3.87–5.11)
RDW: 13.5 % (ref 11.5–15.5)
WBC: 11.4 10*3/uL — ABNORMAL HIGH (ref 4.0–10.5)
nRBC: 0 % (ref 0.0–0.2)

## 2020-01-25 LAB — SARS CORONAVIRUS 2 (TAT 6-24 HRS): SARS Coronavirus 2: NEGATIVE

## 2020-01-25 LAB — BASIC METABOLIC PANEL
Anion gap: 10 (ref 5–15)
BUN: 20 mg/dL (ref 8–23)
CO2: 26 mmol/L (ref 22–32)
Calcium: 8.7 mg/dL — ABNORMAL LOW (ref 8.9–10.3)
Chloride: 102 mmol/L (ref 98–111)
Creatinine, Ser: 0.65 mg/dL (ref 0.44–1.00)
GFR calc Af Amer: >60 mL/min (ref >60)
GFR calc non Af Amer: >60 mL/min (ref >60)
Glucose, Bld: 92 mg/dL (ref 70–99)
Potassium: 4.2 mmol/L (ref 3.5–5.1)
Sodium: 138 mmol/L (ref 135–145)

## 2020-01-25 MED ORDER — FUROSEMIDE 40 MG PO TABS
40.0000 mg | ORAL_TABLET | Freq: Every day | ORAL | Status: DC
  Administered 2020-01-25 – 2020-01-26 (×2): 40 mg via ORAL
  Filled 2020-01-25 (×2): qty 1

## 2020-01-25 NOTE — Progress Notes (Signed)
Physical Therapy Treatment Patient Details Name: Valerie Gay MRN: [id number] DOB: [birth year] Today's Date: 01/25/2020    History of Present Illness Valerie Gay is a 79 y.o. female with medical history significant for COPD with chronic hypoxic respiratory failure, lower extremity swelling, and now presenting to the emergency department for evaluation of worsening left lower extremity swelling and redness.    PT Comments    Patient making steady progress with therapy and progressed to transfers with RW today. She required min assist for bed mobility due to pain in Lt LE and weakness. Mod-Max assist with RW for sit<>stand transfer from EOB and recliner, pt performed 4x total due to fatigue and need to void bladder. Mod-Max assist +2 for stand step transfer and pt continues to prefer TDWB on Lt LE due to pain. Pt's HR elevated to 127-144 bpm after transfers and reporting SOB but denying other symptoms (CP, nausea, dizziness). RN notified. Pt will continue to benefit from skilled PT interventions to address impairments and progress mobility and activity tolerance. Acute will progress as able, recommend SNF with 24/7 assist.    Follow Up Recommendations  SNF(states she's been at [facility] before)     Equipment Recommendations  (TBD at next venue)    Recommendations for Other Services       Precautions / Restrictions Precautions Precautions: Fall Precaution Comments: L foot with wound Restrictions Weight Bearing Restrictions: No(however pt self TDWB on L LE)    Mobility  Bed Mobility Overal bed mobility: Needs Assistance Bed Mobility: Supine to Sit     Supine to sit: Min assist;HOB elevated     General bed mobility comments: HOB elevated, verbal cues for use of bed rail, assist to bring Lt LE to EOB due to pain and weakness.  Transfers Overall transfer level: Needs assistance Equipment used: Rolling walker (2 wheeled) Transfers: Sit to/from [employer] Sit to Stand: Mod assist;Max assist;+2 physical assistance Stand pivot transfers: Mod assist;Max assist;+2 physical assistance;+2 safety/equipment       General transfer comment: Pt limited by Lt LE pain and maintained TDWB on Lt LE by choice. Pt required mod assist +2 to initiate power up from EOB, bed soiled and pt performed stand step trasnfer to recliner. Mod-Max assist +2 requried for stand step to manage RW and maintain balance. Pt c/o SOB after transfer and SpO2 remained at 97% or greater and HR elevated between 128-144 after transfer. Pt performed 2x more for sit<>stand to sit on bedpan. RN and MD informed of elevated HR and c/o SOB, pt denied chest pain, dizziness, nausea, lightheadedness throughout.  Ambulation/Gait Ambulation/Gait assistance: +2 physical assistance;+2 safety/equipment;Mod assist;Max assist Gait Distance (Feet): 4 Feet Assistive device: Rolling walker (2 wheeled) Gait Pattern/deviations: Step-to pattern;Decreased weight shift to left;Decreased stance time - left     General Gait Details: pt performed hopping steps with RW and mod-max assist +2 to complete stand step transfer to recliner. Mod-Max assist +2 requried to manage RW and maintain balance.   Stairs        Wheelchair Mobility    Modified Rankin (Stroke Patients Only)       Balance Overall balance assessment: Needs assistance Sitting-balance support: Feet supported;Single extremity supported Sitting balance-Leahy Scale: Fair     Standing balance support: Bilateral upper extremity supported Standing balance-Leahy Scale: Poor Standing balance comment: dependent on RW and physical assist             Cognition Arousal/Alertness: Awake/alert Behavior During Therapy: WFL for tasks assessed/performed  Overall Cognitive Status: Within Functional Limits for tasks assessed             Exercises      General Comments General comments (skin integrity, edema, etc.): L foot with  dressing on it, RN does wound care daily on it      Pertinent Vitals/Pain Pain Assessment: Faces Faces Pain Scale: Hurts whole lot Pain Location: Lt foto with mobiltiy (denied pain while resting in bed) Pain Descriptors / Indicators: Aching;Burning Pain Intervention(s): Monitored during session;Repositioned       Prior Function            PT Goals (current goals can now be found in the care plan section) Acute Rehab PT Goals Patient Stated Goal: get better PT Goal Formulation: With patient Time For Goal Achievement: 02/07/20 Potential to Achieve Goals: Good    Frequency    Min 3X/week      PT Plan Current plan remains appropriate    Co-evaluation              AM-PAC PT "6 Clicks" Mobility   Outcome Measure  Help needed turning from your back to your side while in a flat bed without using bedrails?: A Little Help needed moving from lying on your back to sitting on the side of a flat bed without using bedrails?: A Little Help needed moving to and from a bed to a chair (including a wheelchair)?: A Lot Help needed standing up from a chair using your arms (e.g., wheelchair or bedside chair)?: A Lot Help needed to walk in hospital room?: A Lot Help needed climbing 3-5 steps with a railing? : Total 6 Click Score: 13    End of Session Equipment Utilized During Treatment: Gait belt;Oxygen(on 3LO2 at home via [place]) Activity Tolerance: Patient limited by pain;Patient limited by fatigue(onset of SOB after transfers, HR elevated, RN notified) Patient left: with call bell/phone within reach;in chair;with chair alarm set Nurse Communication: Mobility status(tachycardia and SOB) PT Visit Diagnosis: Unsteadiness on feet (R26.81);Difficulty in walking, not elsewhere classified (R26.2);Pain Pain - Right/Left: Left Pain - part of body: Ankle and joints of foot     Time: [id number] PT Time Calculation (min) (ACUTE ONLY): 33 min  Charges:  $Therapeutic Activity: 23-37  mins                     Verner Mould, DPT Physical Therapist with [facility] [phone]  01/25/2020 12:06 PM

## 2020-01-25 NOTE — TOC Progression Note (Signed)
Transition of Care [facility]) - Progression Note    Patient Details  Name: Valerie Gay MRN: [id number] Date of Birth: [birth year]  Transition of Care [facility]) CM/SW [place], [place] Phone Number: 01/25/2020, 11:51 AM  Clinical Narrative:    CSW spoke with pt at bed. Provided CMS offers. Pt states she has spoken with her daughter and they have decided on [facility]. We have discussed that pt will get new COVID swab and pt family will need to complete paperwork. At this time d/c likely tomorrow morning as pt MD is stating that pt is ready, pt states understanding.   CSW called Johny Chess was provided with pt daughter information to arrange completion of paperwork.    Expected Discharge Plan: [place] Barriers to Discharge: Continued Medical Work up  Expected Discharge Plan and Services Expected Discharge Plan: [place] In-house Referral: Clinical Social Work Discharge Planning Services: CM Consult Post Acute Care Choice: [place] Living arrangements for the past 2 months: Single Family Home   Readmission Risk Interventions Readmission Risk Prevention Plan 01/24/2020  Post Dischage Appt Not Complete  Appt Comments plan for SNF  Medication Screening Complete  Transportation Screening Complete  Some recent data might be hidden

## 2020-01-25 NOTE — Progress Notes (Signed)
Hospitalist progress note   Patient from home, Patient going-unclear she is undecided, Dispo inpatient pending resolution of cellulitis-likely can discharge a.m. if Covid test negative to nursing facility  Valerie Gay [id number] DOB: March 15, 1941 DOA: 01/22/2020  PCP: Kathyrn Lass, MD   Narrative:  66 white female tobacco COPD stage B on chronic nasal oxygen 3 L acalculous cholecystitis status post surgery 2015 prior right foot cellulitis 04/2017 low risk Myoview 2017-EF 55-65% at that time Last hospital stay 11/2318-->12/10/18 acute superimposed on chronic hypoxic respiratory failure Represented on admission 01/22/2020 left lower extremity redness and swelling status post reported falls 01/16/2020 with abrasions-has been on Lasix last office visit with cardiology 06/23/2019 was placed on 40 instead of 21 of [place] pulmonology visit 10/19/2019 she is noncompliant regularly with her Lasix  Patient admitted secondary to falls and questionable cellulitis left lower extremity-CT scan showed no gas or abscess vancomycin started given Tdap recently  Data Reviewed:  Potassium 3.4-->4.3, BUN/creatinine 24/0.6-->0.72 LFTs normal WBC 13.2-->11.5, platelet 437  Assessment & Plan:  Left lower extremity cellulitis Initially vanc--then Ceftriaxone--> doxycycline stop date 3/16-greatly improved Continue dressings on left lower extremity with Xeroform to cover skin area with ABD pad Mild acute decompensated HFpEF Clinically is euvolemic Home medication Lasix 40 daily resumed COPD still smoker 3 L oxygen Des breathing fair no new issues-ambulate-Dulera 2 puffs twice daily Incruse Ellipta daily Symbicort 2 puffs twice daily albuterol as needed Hypokalemia Replaced and appropriate BPPV Seems like she has had treatment for this before-patient not willing to get the treatment at this time and will let us know if she changes her mind  Patient wishes to update her family herself  Subjective: Decreasing  nauseaAble to eat today  was quite painful for her to stand Seems motivated overall no stool since Monday no chest pain no shortness of breath  Consultants:   None  Objective: Vitals:   01/25/20 0627 01/25/20 0902 01/25/20 1216 01/25/20 1245  BP: (!) 135/51  128/78   Pulse: 96  (!) 123 98  Resp: 18  18   Temp: 98.4 F (36.9 C)  98.3 F (36.8 C)   TempSrc: Oral  Oral   SpO2: 99% 98% 100%   Weight:      Height:        Intake/Output Summary (Last 24 hours) at 01/25/2020 1342 Last data filed at 01/25/2020 0800 Gross per 24 hour  Intake 480 ml  Output 600 ml  Net -120 ml   Filed Weights   01/22/20 1703  Weight: 59 kg    Examination: Awake coherent pleasant no distress on oxygen EOMI NCAT no focal deficit S1-S2 no murmur rub or gallop Abdomen soft no rebound no guarding Wound not examined today Scheduled Meds: . doxycycline  100 mg Oral Q12H  . enoxaparin (LOVENOX) injection  40 mg Subcutaneous Q24H  . furosemide  40 mg Oral Daily  . mometasone-formoterol  2 puff Inhalation BID  . umeclidinium bromide  1 puff Inhalation Q2000   Continuous Infusions:    LOS: 2 days   Time spent: [place], MD [facility]  01/25/2020, 1:42 PM

## 2020-01-25 NOTE — Social Work (Signed)
CSW stopped by pt room, pt working with therapy teams at that time. CSW will stop back by as time permits.   Westley Hummer, MSW, [place] Work

## 2020-01-26 DIAGNOSIS — S91302A Unspecified open wound, left foot, initial encounter: Secondary | ICD-10-CM | POA: Diagnosis not present

## 2020-01-26 DIAGNOSIS — I872 Venous insufficiency (chronic) (peripheral): Secondary | ICD-10-CM | POA: Diagnosis not present

## 2020-01-26 DIAGNOSIS — M2550 Pain in unspecified joint: Secondary | ICD-10-CM | POA: Diagnosis not present

## 2020-01-26 DIAGNOSIS — L03116 Cellulitis of left lower limb: Secondary | ICD-10-CM | POA: Diagnosis not present

## 2020-01-26 DIAGNOSIS — M6281 Muscle weakness (generalized): Secondary | ICD-10-CM | POA: Diagnosis not present

## 2020-01-26 DIAGNOSIS — J449 Chronic obstructive pulmonary disease, unspecified: Secondary | ICD-10-CM | POA: Diagnosis not present

## 2020-01-26 DIAGNOSIS — S91302D Unspecified open wound, left foot, subsequent encounter: Secondary | ICD-10-CM | POA: Diagnosis not present

## 2020-01-26 DIAGNOSIS — R2681 Unsteadiness on feet: Secondary | ICD-10-CM | POA: Diagnosis not present

## 2020-01-26 DIAGNOSIS — J9611 Chronic respiratory failure with hypoxia: Secondary | ICD-10-CM | POA: Diagnosis not present

## 2020-01-26 DIAGNOSIS — Z7401 Bed confinement status: Secondary | ICD-10-CM | POA: Diagnosis not present

## 2020-01-26 DIAGNOSIS — I509 Heart failure, unspecified: Secondary | ICD-10-CM | POA: Diagnosis not present

## 2020-01-26 DIAGNOSIS — R600 Localized edema: Secondary | ICD-10-CM | POA: Diagnosis not present

## 2020-01-26 DIAGNOSIS — I5021 Acute systolic (congestive) heart failure: Secondary | ICD-10-CM | POA: Diagnosis not present

## 2020-01-26 DIAGNOSIS — Z20828 Contact with and (suspected) exposure to other viral communicable diseases: Secondary | ICD-10-CM | POA: Diagnosis not present

## 2020-01-26 DIAGNOSIS — L0390 Cellulitis, unspecified: Secondary | ICD-10-CM | POA: Diagnosis not present

## 2020-01-26 DIAGNOSIS — I959 Hypotension, unspecified: Secondary | ICD-10-CM | POA: Diagnosis not present

## 2020-01-26 DIAGNOSIS — R278 Other lack of coordination: Secondary | ICD-10-CM | POA: Diagnosis not present

## 2020-01-26 DIAGNOSIS — R2689 Other abnormalities of gait and mobility: Secondary | ICD-10-CM | POA: Diagnosis not present

## 2020-01-26 DIAGNOSIS — I2510 Atherosclerotic heart disease of native coronary artery without angina pectoris: Secondary | ICD-10-CM | POA: Diagnosis not present

## 2020-01-26 LAB — CBC WITH DIFFERENTIAL/PLATELET
Abs Immature Granulocytes: 0.06 10*3/uL (ref 0.00–0.07)
Basophils Absolute: 0 10*3/uL (ref 0.0–0.1)
Basophils Relative: 0 %
Eosinophils Absolute: 0.3 10*3/uL (ref 0.0–0.5)
Eosinophils Relative: 3 %
HCT: 31.6 % — ABNORMAL LOW (ref 36.0–46.0)
Hemoglobin: 10 g/dL — ABNORMAL LOW (ref 12.0–15.0)
Immature Granulocytes: 1 %
Lymphocytes Relative: 16 %
Lymphs Abs: 1.6 10*3/uL (ref 0.7–4.0)
MCH: 30.2 pg (ref 26.0–34.0)
MCHC: 31.6 g/dL (ref 30.0–36.0)
MCV: 95.5 fL (ref 80.0–100.0)
Monocytes Absolute: 1.1 10*3/uL — ABNORMAL HIGH (ref 0.1–1.0)
Monocytes Relative: 10 %
Neutro Abs: 7.4 10*3/uL (ref 1.7–7.7)
Neutrophils Relative %: 70 %
Platelets: 466 10*3/uL — ABNORMAL HIGH (ref 150–400)
RBC: 3.31 MIL/uL — ABNORMAL LOW (ref 3.87–5.11)
RDW: 13.5 % (ref 11.5–15.5)
WBC: 10.4 10*3/uL (ref 4.0–10.5)
nRBC: 0 % (ref 0.0–0.2)

## 2020-01-26 LAB — BASIC METABOLIC PANEL
Anion gap: 13 (ref 5–15)
BUN: 22 mg/dL (ref 8–23)
CO2: 29 mmol/L (ref 22–32)
Calcium: 9 mg/dL (ref 8.9–10.3)
Chloride: 99 mmol/L (ref 98–111)
Creatinine, Ser: 0.55 mg/dL (ref 0.44–1.00)
GFR calc Af Amer: >60 mL/min (ref >60)
GFR calc non Af Amer: >60 mL/min (ref >60)
Glucose, Bld: 94 mg/dL (ref 70–99)
Potassium: 3.8 mmol/L (ref 3.5–5.1)
Sodium: 141 mmol/L (ref 135–145)

## 2020-01-26 MED ORDER — PANTOPRAZOLE SODIUM 40 MG PO TBEC
40.0000 mg | DELAYED_RELEASE_TABLET | Freq: Every day | ORAL | Status: DC
  Administered 2020-01-26: 12:00:00 40 mg via ORAL
  Filled 2020-01-26: qty 1

## 2020-01-26 MED ORDER — ACETAMINOPHEN 325 MG PO TABS: 650.0000 mg | ORAL_TABLET | Freq: Four times a day (QID) | ORAL | Status: AC | PRN

## 2020-01-26 MED ORDER — POLYETHYLENE GLYCOL 3350 17 G PO PACK: 17.0000 g | PACK | Freq: Every day | ORAL | 0 refills | Status: DC | PRN

## 2020-01-26 MED ORDER — DOXYCYCLINE HYCLATE 100 MG PO TABS: 100.0000 mg | ORAL_TABLET | Freq: Two times a day (BID) | ORAL | 0 refills | Status: DC

## 2020-01-26 NOTE — Progress Notes (Signed)
RN called and gave report to [place] at [facility] she stated understanding. Pt daily dressing change has been done and is waiting for PTAR belongings packed IV has been removed

## 2020-01-26 NOTE — Social Work (Signed)
Clinical Social Worker facilitated patient discharge including contacting patient family and facility to confirm patient discharge plans.  Clinical information faxed to facility and family agreeable with plan.  CSW arranged ambulance transport via [facility] to [place] to call [phone]  with report prior to discharge.  Clinical Social Worker will sign off for now as social work intervention is no longer needed. Please consult [place] again if new need arises.  Westley Hummer, MSW, LCSW Clinical Social Worker

## 2020-01-26 NOTE — Progress Notes (Signed)
Physical Therapy Treatment Patient Details Name: Valerie Gay MRN: [id number] DOB: [birth year] Today's Date: 01/26/2020    History of Present Illness Valerie Gay is a 79 y.o. female with medical history significant for COPD with chronic hypoxic respiratory failure, lower extremity swelling, and now presenting to the emergency department for evaluation of worsening left lower extremity swelling and redness.    PT Comments    Pt in bed upon arrival of PT, agreeable to PT session with focus on progressing activity tolerance and independence. The pt continues to present with limitations in functional mobility, stability, and endurance compared to their prior level of function and independence due to above dx. The pt was able to demo improved use of BLE for bed mobility today, but continues to need minA to come to sitting EOB. The pt was able to achieve sig improvements in transfer ability (progressing from mod/maxA last session to minG today) with use of RW. The pt remains limited by sig pain in the LLE when it is in a dependent position and will continue to benefit from skilled PT to progress functional mobility and independence.     Follow Up Recommendations  SNF     Equipment Recommendations  Other (comment)(TBD at next venue)    Recommendations for Other Services       Precautions / Restrictions Precautions Precautions: Fall Precaution Comments: L foot with wound Restrictions Weight Bearing Restrictions: No    Mobility  Bed Mobility Overal bed mobility: Needs Assistance Bed Mobility: Supine to Sit;Sit to Supine     Supine to sit: Min assist Sit to supine: Min assist   General bed mobility comments: HOB elevated, verbal cues for use of bed rail, pt prefers assist from PT for elevation of trunk to sitting, able to move BLE to EOB. able to bridge in bed to reposition  Transfers Overall transfer level: Needs assistance Equipment used: Rolling walker (2  wheeled) Transfers: Sit to/from Stand Sit to Stand: Min assist;Min guard         General transfer comment: pt initially requiring min/modA with RW to complete. Session focused on improved sit-stand mechanics and independence without VCs for trchnique. Pt able to progress to minG with extra time to complete each stand.  Ambulation/Gait Ambulation/Gait assistance: (pt deferred today due to pain)               Stairs             Wheelchair Mobility    Modified Rankin (Stroke Patients Only)       Balance Overall balance assessment: Needs assistance Sitting-balance support: Feet supported;Single extremity supported Sitting balance-Leahy Scale: Good     Standing balance support: Bilateral upper extremity supported Standing balance-Leahy Scale: Poor Standing balance comment: dependent on RW and physical assist                            Cognition Arousal/Alertness: Awake/alert Behavior During Therapy: WFL for tasks assessed/performed Overall Cognitive Status: Within Functional Limits for tasks assessed                                        Exercises      General Comments General comments (skin integrity, edema, etc.): L foot with dressing on it, RN does wound care daily on it      Pertinent Vitals/Pain Pain Assessment: Faces Pain  Score: 9 (when in dependent position, 0/10 at rest) Pain Location: L foot with mobiltiy (denied pain while resting in bed) Pain Descriptors / Indicators: Aching;Burning;Grimacing Pain Intervention(s): Limited activity within patient's tolerance;Monitored during session;Repositioned    Home Living                      Prior Function            PT Goals (current goals can now be found in the care plan section) Acute Rehab PT Goals Patient Stated Goal: get better PT Goal Formulation: With patient Time For Goal Achievement: 02/07/20 Potential to Achieve Goals: Good Progress towards PT  goals: Progressing toward goals    Frequency    Min 3X/week      PT Plan Current plan remains appropriate    Co-evaluation              AM-PAC PT "6 Clicks" Mobility   Outcome Measure  Help needed turning from your back to your side while in a flat bed without using bedrails?: A Little Help needed moving from lying on your back to sitting on the side of a flat bed without using bedrails?: A Little Help needed moving to and from a bed to a chair (including a wheelchair)?: A Lot Help needed standing up from a chair using your arms (e.g., wheelchair or bedside chair)?: A Little Help needed to walk in hospital room?: A Lot Help needed climbing 3-5 steps with a railing? : Total 6 Click Score: 14    End of Session Equipment Utilized During Treatment: Gait belt;Oxygen(3L [place]) Activity Tolerance: Patient limited by pain;Patient tolerated treatment well Patient left: with call bell/phone within reach;in bed;with bed alarm set Nurse Communication: Mobility status PT Visit Diagnosis: Unsteadiness on feet (R26.81);Difficulty in walking, not elsewhere classified (R26.2);Pain Pain - Right/Left: Left Pain - part of body: Ankle and joints of foot;Leg     Time: 1003-1035 PT Time Calculation (min) (ACUTE ONLY): 32 min  Charges:  $Therapeutic Exercise: 8-22 mins $Therapeutic Activity: 8-22 mins                     Karma Ganja, PT, DPT   Acute Rehabilitation Department Pager #: [phone]   Otho Bellows 01/26/2020, 1:13 PM

## 2020-01-26 NOTE — TOC Transition Note (Addendum)
Transition of Care [facility]) - CM/SW Discharge Note   Patient Details  Name: Valerie Gay MRN: [id number] Date of Birth: 1941-03-02  [facility] CM/SW Contact:  Alexander Mt, LCSW Phone Number: 01/26/2020, 10:41 AM   Clinical Narrative:   11:15am- CSW spoke with pt via phone. She is aware and amenable to dc today. [facility] called, she is ready.   10:35am- CSW called and attempted to speak with pt on room phone, alerted her of discharge today. Pt daughter has completed paperwork for admissions to [facility]. RN aware, will bring down [facility] papers for pt.     Barriers to Discharge: Continued Medical Work up   Patient Goals and CMS Choice Patient states their goals for this hospitalization and ongoing recovery are:: get stronger, be able to return home CMS Medicare.gov Compare Post Acute Care list provided to:: Patient Choice offered to / list presented to : Patient  Discharge Placement   Existing PASRR number confirmed : 01/24/20          Patient chooses bed at: [facility] Patient to be transferred to facility by: [place] Name of family member notified: pt responsible for self. Patient and family notified of of transfer: 01/26/20  Discharge Plan and Services In-house Referral: Clinical Social Work Discharge Planning Services: CM Consult Post Acute Care Choice: [place]           Readmission Risk Interventions Readmission Risk Prevention Plan 01/24/2020  Post Dischage Appt Not Complete  Appt Comments plan for SNF  Medication Screening Complete  Transportation Screening Complete  Some recent data might be hidden

## 2020-01-26 NOTE — Discharge Summary (Signed)
Physician Discharge Summary  Valerie Gay [id number] DOB: 1941/02/11 DOA: 01/22/2020  PCP: Kathyrn Lass, MD  Admit date: 01/22/2020 Discharge date: 01/26/2020  Time spent: 35 minutes  Recommendations for Outpatient Follow-up:  1. Recommend Chem-12, CBC 1 week as follow-up\ 2. Recommend screening CT chest given continued smoker 1 follows with primary care 3. Complete doxycycline 3/14 which would be total of 7 days of antibiotics 4. May need to adjust/tailor dose of Lasix dependent on breathing symptoms fluid gain and lower extremity swelling 5. Please demarcate and take periodic pictures of healing of lower extremity-see my note today for pictures 6. Going to skilled rehab for further therapy services   Discharge Diagnoses:  Principal Problem:   Cellulitis of left lower extremity Active Problems:   COPD (chronic obstructive pulmonary disease) (HCC)   Chronic respiratory failure with hypoxia (HCC)   CAD (coronary artery disease)   Bilateral edema of lower extremity   Discharge Condition: Improved  Diet recommendation: Heart healthy  Filed Weights   01/22/20 1703  Weight: 59 kg    History of present illness:  9 white female tobacco COPD stage B on chronic nasal oxygen 3 L acalculous cholecystitis status post surgery 2015 prior right foot cellulitis 04/2017 low risk Myoview 2017-EF 55-65% at that time Last hospital stay 11/2318-->12/10/18 acute superimposed on chronic hypoxic respiratory failure Represented on admission 01/22/2020 left lower extremity redness and swelling status post reported falls 01/16/2020 with abrasions-has been on Lasix last office visit with cardiology 06/23/2019 was placed on 40 instead of 20 of [place] pulmonology visit 10/19/2019 she is noncompliant regularly with her Lasix  Patient admitted secondary to falls and questionable cellulitis left lower extremity-CT scan showed no gas or abscess vancomycin started given Tdap recently  Hospital Course:  Left  lower extremity cellulitis Initially vanc--then Ceftriaxone--> doxycycline stop date 3/14-greatly improved Continue dressings on left lower extremity with Xeroform to cover skin area with ABD pad-see pictures below Mild acute decompensated HFpEF Clinically is euvolemic Home medication Lasix 40 daily resumed COPD still smoker 3 L oxygen Des breathing fair no new issues-ambulate-Dulera 2 puffs twice daily Incruse Ellipta daily Symbicort 2 puffs twice daily albuterol as needed Hypokalemia Replaced and appropriate BPPV Seems like she has had treatment for this before-patient not willing to get the treatment at this time and will let us know if she changes her mind   Consultations:  Wound nurse  Discharge Exam: Vitals:   01/26/20 0538 01/26/20 0739  BP: (!) 125/56   Pulse: 86   Resp: 16   Temp: 98.3 F (36.8 C)   SpO2: 99% 99%    General: Alert coherent no distress EOMI NCAT no focal deficit Cardiovascular: Normal S1-2 no murmur rub or gallop?  Holosystolic murmur Respiratory: Clinically clear no added sound no rales no rhonchi no wheeze       Discharge Instructions   Discharge Instructions    Diet - low sodium heart healthy   Complete by: As directed    Increase activity slowly   Complete by: As directed      Allergies as of 01/26/2020      Reactions   Spiriva Handihaler [tiotropium Bromide Monohydrate] Hives, Itching, Rash    on upper body   Banana Itching      Medication List    STOP taking these medications   ibuprofen 200 MG tablet Commonly known as: ADVIL     TAKE these medications   acetaminophen 325 MG tablet Commonly known as: TYLENOL Take 2 tablets (650  mg total) by mouth every 6 (six) hours as needed for mild pain (or Fever >/= 101). What changed:   how much to take  reasons to take this   albuterol 108 (90 Base) MCG/ACT inhaler Commonly known as: VENTOLIN HFA Inhale 2 puffs into the lungs every 6 (six) hours as needed for wheezing or  shortness of breath. What changed: Another medication with the same name was changed. Make sure you understand how and when to take each.   albuterol (2.5 MG/3ML) 0.083% nebulizer solution Commonly known as: PROVENTIL USE 1 VIAL VIA NEBULIZER EVERY 8 HOURS AS NEEDED FOR WHEEZING What changed: See the new instructions.   Ayr 0.65 % nasal spray Generic drug: sodium chloride Place 1 spray into the nose 6 (six) times daily.   AYR SALINE NASAL GEL NA Place 1 application into the nose 6 (six) times daily.   doxycycline 100 MG tablet Commonly known as: VIBRA-TABS Take 1 tablet (100 mg total) by mouth every 12 (twelve) hours.   furosemide 40 MG tablet Commonly known as: LASIX Take 1 tablet (40 mg total) by mouth daily.   hydroxypropyl methylcellulose / hypromellose 2.5 % ophthalmic solution Commonly known as: ISOPTO TEARS / GONIOVISC Place 2 drops into both eyes 3 (three) times daily as needed for dry eyes.   Incruse Ellipta 62.5 MCG/INH Aepb Generic drug: umeclidinium bromide USE 1 INHALATION BY MOUTH  DAILY What changed: See the new instructions.   magnesium citrate Soln Take by mouth daily as needed for severe constipation (constipation).   OXYGEN Inhale 3 L into the lungs continuous.   polyethylene glycol 17 g packet Commonly known as: MIRALAX / GLYCOLAX Take 17 g by mouth daily as needed for mild constipation.   Symbicort 160-4.5 MCG/ACT inhaler Generic drug: budesonide-formoterol USE 2 PUFFS BY MOUTH TWO  TIMES DAILY What changed: See the new instructions.   UNKNOWN TO PATIENT Place into the right eye See admin instructions. Eye injection (name unknown to patient): Inject into the right eye every 7-8 weeks for wet macular degeneration      Allergies  Allergen Reactions  . Spiriva Handihaler [Tiotropium Bromide Monohydrate] Hives, Itching and Rash     on upper body  . Banana Itching   Contact information for after-discharge care    Destination     [facility] Preferred SNF .   Service: Skilled Nursing Contact information: [place] [phone]               The results of significant diagnostics from this hospitalization (including imaging, microbiology, ancillary and laboratory) are listed below for reference.    Significant Diagnostic Studies: CT TIBIA FIBULA LEFT WO CONTRAST  Result Date: 01/22/2020 CLINICAL DATA:  Pain and swelling status post fall. EXAM: CT OF THE LEFT TIBIA AND FIBULA, LEFT FOOT, AND LEFT ANKLE TECHNIQUE: Multidetector CT imaging of the lower left extremity was performed according to the standard protocol. COMPARISON:  None. FINDINGS: Bones/Joint/Cartilage There is no acute displaced fracture.  There is an os trigonum. Ligaments Suboptimally assessed by CT. Muscles and Tendons No acute muscular abnormality. Soft tissues There is extensive soft tissue swelling about the foot with a moderate-sized hematoma involving the lateral aspect of the midfoot. This hematoma measures approximately 7.2 x 2.4 cm. Vascular calcifications are noted and likely explains the finding seen on the patient's recent foot radiograph. There is soft tissue swelling about the left lower extremity with some overlying skin thickening. IMPRESSION: 1. No acute fracture  or dislocation. Curvilinear density seen on the patient's recent foot radiograph is related to vascular calcifications. 2. Extensive soft tissue swelling about the foot with a moderate-sized hematoma involving the lateral aspect of the midfoot. Electronically Signed   By: Constance Holster M.D.   On: 01/22/2020 22:29   CT FOOT LEFT WO CONTRAST  Result Date: 01/22/2020 CLINICAL DATA:  Pain and swelling status post fall. EXAM: CT OF THE LEFT TIBIA AND FIBULA, LEFT FOOT, AND LEFT ANKLE TECHNIQUE: Multidetector CT imaging of the lower left extremity was performed according to the standard protocol. COMPARISON:  None.  FINDINGS: Bones/Joint/Cartilage There is no acute displaced fracture.  There is an os trigonum. Ligaments Suboptimally assessed by CT. Muscles and Tendons No acute muscular abnormality. Soft tissues There is extensive soft tissue swelling about the foot with a moderate-sized hematoma involving the lateral aspect of the midfoot. This hematoma measures approximately 7.2 x 2.4 cm. Vascular calcifications are noted and likely explains the finding seen on the patient's recent foot radiograph. There is soft tissue swelling about the left lower extremity with some overlying skin thickening. IMPRESSION: 1. No acute fracture or dislocation. Curvilinear density seen on the patient's recent foot radiograph is related to vascular calcifications. 2. Extensive soft tissue swelling about the foot with a moderate-sized hematoma involving the lateral aspect of the midfoot. Electronically Signed   By: Constance Holster M.D.   On: 01/22/2020 22:29   CT ANKLE LEFT WO CONTRAST  Result Date: 01/22/2020 CLINICAL DATA:  Pain and swelling status post fall. EXAM: CT OF THE LEFT TIBIA AND FIBULA, LEFT FOOT, AND LEFT ANKLE TECHNIQUE: Multidetector CT imaging of the lower left extremity was performed according to the standard protocol. COMPARISON:  None. FINDINGS: Bones/Joint/Cartilage There is no acute displaced fracture.  There is an os trigonum. Ligaments Suboptimally assessed by CT. Muscles and Tendons No acute muscular abnormality. Soft tissues There is extensive soft tissue swelling about the foot with a moderate-sized hematoma involving the lateral aspect of the midfoot. This hematoma measures approximately 7.2 x 2.4 cm. Vascular calcifications are noted and likely explains the finding seen on the patient's recent foot radiograph. There is soft tissue swelling about the left lower extremity with some overlying skin thickening. IMPRESSION: 1. No acute fracture or dislocation. Curvilinear density seen on the patient's recent foot  radiograph is related to vascular calcifications. 2. Extensive soft tissue swelling about the foot with a moderate-sized hematoma involving the lateral aspect of the midfoot. Electronically Signed   By: Constance Holster M.D.   On: 01/22/2020 22:29   DG Foot Complete Left  Result Date: 01/22/2020 CLINICAL DATA:  Fall several days ago with foot pain, initial encounter EXAM: LEFT FOOT - COMPLETE 3+ VIEW COMPARISON:  01/17/2020 FINDINGS: Considerable soft tissue swelling is noted. There is a linear density noted along the base of the metatarsals on the lateral film only which may represent a small avulsion fracture from 1 of the metatarsal bases. No other fracture is seen. No other soft tissue abnormality is noted. IMPRESSION: Considerable soft tissue swelling. Changes suspicious for an avulsion at the base of the metatarsals best seen on the lateral projection. Electronically Signed   By: Inez Catalina M.D.   On: 01/22/2020 18:00    Microbiology: Recent Results (from the past 240 hour(s))  SARS CORONAVIRUS 2 (TAT 6-24 HRS) Nasopharyngeal Nasopharyngeal Swab     Status: None   Collection Time: 01/22/20  7:45 PM   Specimen: Nasopharyngeal Swab  Result Value Ref Range Status  SARS Coronavirus 2 NEGATIVE NEGATIVE Final    Comment: (NOTE) SARS-CoV-2 target nucleic acids are NOT DETECTED. The SARS-CoV-2 RNA is generally detectable in upper and lower respiratory specimens during the acute phase of infection. Negative results do not preclude SARS-CoV-2 infection, do not rule out co-infections with other pathogens, and should not be used as the sole basis for treatment or other patient management decisions. Negative results must be combined with clinical observations, patient history, and epidemiological information. The expected result is Negative. Fact Sheet for Patients: SugarRoll.be Fact Sheet for Healthcare Providers: https://www.woods-mathews.com/ This  test is not yet approved or cleared by the [place] FDA and  has been authorized for detection and/or diagnosis of SARS-CoV-2 by FDA under an Emergency Use Authorization (EUA). This EUA will remain  in effect (meaning this test can be used) for the duration of the COVID-19 declaration under Section 56 4(b)(1) of the Act, 21 U.S.C. section 360bbb-3(b)(1), unless the authorization is terminated or revoked sooner. Performed at [facility], [place] [address] [place], [place] [address]   Culture, blood (routine x 2)     Status: None (Preliminary result)   Collection Time: 01/22/20  9:17 PM   Specimen: BLOOD  Result Value Ref Range Status   Specimen Description BLOOD RIGHT ANTECUBITAL  Final   Special Requests   Final    BOTTLES DRAWN AEROBIC ONLY Blood Culture results may not be optimal due to an inadequate volume of blood received in culture bottles   Culture   Final    NO GROWTH 4 DAYS Performed at [facility], [place] [address] [place], [place] [address]    Report Status PENDING  Incomplete  Culture, blood (routine x 2)     Status: None (Preliminary result)   Collection Time: 01/22/20  9:20 PM   Specimen: BLOOD RIGHT HAND  Result Value Ref Range Status   Specimen Description BLOOD RIGHT HAND  Final   Special Requests   Final    BOTTLES DRAWN AEROBIC AND ANAEROBIC Blood Culture results may not be optimal due to an inadequate volume of blood received in culture bottles   Culture   Final    NO GROWTH 4 DAYS Performed at [facility], [place] [address], [place], [place] [address]    Report Status PENDING  Incomplete  MRSA PCR Screening     Status: None   Collection Time: 01/22/20 11:10 PM   Specimen: Nasopharyngeal  Result Value Ref Range Status   MRSA by PCR NEGATIVE NEGATIVE Final    Comment:        The GeneXpert MRSA Assay (FDA approved for NASAL specimens only), is one component of a comprehensive MRSA colonization surveillance program. It is not intended  to diagnose MRSA infection nor to guide or monitor treatment for MRSA infections. Performed at [facility], [place] [address], [place], [place] [address]   SARS CORONAVIRUS 2 (TAT 6-24 HRS) Nasopharyngeal Nasopharyngeal Swab     Status: None   Collection Time: 01/25/20 10:39 AM   Specimen: Nasopharyngeal Swab  Result Value Ref Range Status   SARS Coronavirus 2 NEGATIVE NEGATIVE Final    Comment: (NOTE) SARS-CoV-2 target nucleic acids are NOT DETECTED. The SARS-CoV-2 RNA is generally detectable in upper and lower respiratory specimens during the acute phase of infection. Negative results do not preclude SARS-CoV-2 infection, do not rule out co-infections with other pathogens, and should not be used as the sole basis for treatment or other patient management decisions.  Negative results must be combined with clinical observations, patient history, and epidemiological information. The expected result is Negative. Fact Sheet for Patients: SugarRoll.be Fact Sheet for Healthcare Providers: https://www.woods-mathews.com/ This test is not yet approved or cleared by the [place] FDA and  has been authorized for detection and/or diagnosis of SARS-CoV-2 by FDA under an Emergency Use Authorization (EUA). This EUA will remain  in effect (meaning this test can be used) for the duration of the COVID-19 declaration under Section 56 4(b)(1) of the Act, 21 U.S.C. section 360bbb-3(b)(1), unless the authorization is terminated or revoked sooner. Performed at [facility], [place] [address] [place], [place] [address]      Labs: Basic Metabolic Panel: Recent Labs  Lab 01/22/20 1713 01/23/20 0537 01/24/20 0527 01/25/20 0622 01/26/20 0617  NA 142 141 140 138 141  K 3.7 3.4* 4.3 4.2 3.8  CL 107 103 105 102 99  CO2 27 27 27 26 29   GLUCOSE 119* 98 100* 92 94  BUN 26* 24* 20 20 22   CREATININE 0.65 0.66 0.72 0.65 0.55  CALCIUM 8.8* 8.6* 8.7*  8.7* 9.0   Liver Function Tests: Recent Labs  Lab 01/22/20 1713 01/24/20 0527  AST 15 17  ALT 11 14  ALKPHOS 64 57  BILITOT 0.9 1.3*  PROT 6.4* 5.5*  ALBUMIN 3.3* 2.5*   No results for input(s): LIPASE, AMYLASE in the last 168 hours. No results for input(s): AMMONIA in the last 168 hours. CBC: Recent Labs  Lab 01/22/20 1713 01/23/20 0537 01/24/20 0527 01/25/20 0622 01/26/20 0617  WBC 13.4* 13.2* 11.5* 11.4* 10.4  NEUTROABS 10.7* 9.9* 8.9* 8.6* 7.4  HGB 11.2* 9.6* 10.2* 10.1* 10.0*  HCT 35.9* 30.8* 32.6* 32.2* 31.6*  MCV 97.3 97.5 97.3 96.7 95.5  PLT 483* 398 437* 438* 466*   Cardiac Enzymes: No results for input(s): CKTOTAL, CKMB, CKMBINDEX, TROPONINI in the last 168 hours. BNP: BNP (last 3 results) No results for input(s): BNP in the last 8760 hours.  ProBNP (last 3 results) Recent Labs    06/23/19 1216  PROBNP 167    CBG: No results for input(s): GLUCAP in the last 168 hours.     Signed:  Nita Sells MD   [facility] 01/26/2020, 9:21 AM

## 2020-01-27 LAB — CULTURE, BLOOD (ROUTINE X 2)
Culture: NO GROWTH
Culture: NO GROWTH

## 2020-01-31 ENCOUNTER — Other Ambulatory Visit: Payer: Self-pay | Admitting: *Deleted

## 2020-01-31 DIAGNOSIS — S91302A Unspecified open wound, left foot, initial encounter: Secondary | ICD-10-CM | POA: Diagnosis not present

## 2020-01-31 NOTE — Patient Outreach (Signed)
Screened for potential [facility] Care Management needs as a benefit of  [employer].  Valerie Gay is currently receiving skilled therapy at [facility].   Writer attended telephonic interdisciplinary team meeting to assess for disposition needs and transition plan for resident.   Will continue to follow for transition plans while member resides in SNF.    Valerie Rolling, MSN-Ed, RN,BSN Valerie Gay Acute Care Coordinator [phone] [facility]) [phone]  (Toll free office)

## 2020-02-10 DIAGNOSIS — J449 Chronic obstructive pulmonary disease, unspecified: Secondary | ICD-10-CM | POA: Diagnosis not present

## 2020-02-10 DIAGNOSIS — R600 Localized edema: Secondary | ICD-10-CM | POA: Diagnosis not present

## 2020-02-10 DIAGNOSIS — I2510 Atherosclerotic heart disease of native coronary artery without angina pectoris: Secondary | ICD-10-CM | POA: Diagnosis not present

## 2020-02-10 DIAGNOSIS — L03116 Cellulitis of left lower limb: Secondary | ICD-10-CM | POA: Diagnosis not present

## 2020-02-10 DIAGNOSIS — J9611 Chronic respiratory failure with hypoxia: Secondary | ICD-10-CM | POA: Diagnosis not present

## 2020-03-01 DIAGNOSIS — J9611 Chronic respiratory failure with hypoxia: Secondary | ICD-10-CM | POA: Diagnosis not present

## 2020-03-01 DIAGNOSIS — I872 Venous insufficiency (chronic) (peripheral): Secondary | ICD-10-CM | POA: Diagnosis not present

## 2020-03-01 DIAGNOSIS — J449 Chronic obstructive pulmonary disease, unspecified: Secondary | ICD-10-CM | POA: Diagnosis not present

## 2020-03-01 DIAGNOSIS — I509 Heart failure, unspecified: Secondary | ICD-10-CM | POA: Diagnosis not present

## 2020-03-05 ENCOUNTER — Other Ambulatory Visit: Payer: Self-pay | Admitting: *Deleted

## 2020-03-05 DIAGNOSIS — J441 Chronic obstructive pulmonary disease with (acute) exacerbation: Secondary | ICD-10-CM

## 2020-03-05 NOTE — Patient Outreach (Signed)
[place] Coordinator follow up  Valerie Gay is receiving skilled therapy at [facility]. Telephone call made to Valerie Gay [phone]. Attempted twice without success. Telephone call made to daughter/DPR Valerie Gay [phone]. Patient identifiers confirmed.   Valerie Gay endorses that member will transition home on tomorrow 03/06/20 and will have [place]. States member lives alone but she lives nearby and assists with meals and transportation and the like. Encouraged Valerie Gay to make follow up appointment with PCP office. Valerie Gay states she will. Also states they are considering a medical alert system. Plan on looking into it. Also states the home health nurse is supposed to show her how to change member's  dressing.   Explained [facility] Care Management services and [facility]. Valerie Gay is agreeable. Valerie Gay asks Probation officer to alert [facility] to call her to schedule home visits. States she keeps up with all of Valerie Gay appointments. Asked Valerie Gay to please inform Valerie Gay of [place] [facility] referral as well.   Will email [place] [facility] and [facility] information to daughter.   Facility SW confirmed Valerie Gay will  transition home tomorrow with [facility] and 3 in 1 commode and rolling walker.   Referral made to [facility]. Will make [place] for complex case management.  Valerie Gay has a medical history of COPD with chronic respiratory failure, wears home 02, cellulitis, HLD.    Marthenia Rolling, MSN-Ed, RN,BSN [place] Acute Care Coordinator [phone] [facility]) [phone]  (Toll free office)

## 2020-03-07 DIAGNOSIS — R600 Localized edema: Secondary | ICD-10-CM | POA: Diagnosis not present

## 2020-03-07 DIAGNOSIS — J449 Chronic obstructive pulmonary disease, unspecified: Secondary | ICD-10-CM | POA: Diagnosis not present

## 2020-03-07 DIAGNOSIS — J9611 Chronic respiratory failure with hypoxia: Secondary | ICD-10-CM | POA: Diagnosis not present

## 2020-03-07 DIAGNOSIS — I2510 Atherosclerotic heart disease of native coronary artery without angina pectoris: Secondary | ICD-10-CM | POA: Diagnosis not present

## 2020-03-07 DIAGNOSIS — L03116 Cellulitis of left lower limb: Secondary | ICD-10-CM | POA: Diagnosis not present

## 2020-03-08 ENCOUNTER — Other Ambulatory Visit: Payer: Self-pay | Admitting: *Deleted

## 2020-03-08 DIAGNOSIS — S91312D Laceration without foreign body, left foot, subsequent encounter: Secondary | ICD-10-CM | POA: Diagnosis not present

## 2020-03-08 DIAGNOSIS — H3530 Unspecified macular degeneration: Secondary | ICD-10-CM | POA: Diagnosis not present

## 2020-03-08 DIAGNOSIS — I5031 Acute diastolic (congestive) heart failure: Secondary | ICD-10-CM | POA: Diagnosis not present

## 2020-03-08 DIAGNOSIS — I2510 Atherosclerotic heart disease of native coronary artery without angina pectoris: Secondary | ICD-10-CM | POA: Diagnosis not present

## 2020-03-08 DIAGNOSIS — Z9981 Dependence on supplemental oxygen: Secondary | ICD-10-CM | POA: Diagnosis not present

## 2020-03-08 DIAGNOSIS — W19XXXD Unspecified fall, subsequent encounter: Secondary | ICD-10-CM | POA: Diagnosis not present

## 2020-03-08 DIAGNOSIS — L03116 Cellulitis of left lower limb: Secondary | ICD-10-CM | POA: Diagnosis not present

## 2020-03-08 DIAGNOSIS — Z87442 Personal history of urinary calculi: Secondary | ICD-10-CM | POA: Diagnosis not present

## 2020-03-08 DIAGNOSIS — E785 Hyperlipidemia, unspecified: Secondary | ICD-10-CM | POA: Diagnosis not present

## 2020-03-08 DIAGNOSIS — J9611 Chronic respiratory failure with hypoxia: Secondary | ICD-10-CM | POA: Diagnosis not present

## 2020-03-08 DIAGNOSIS — H8110 Benign paroxysmal vertigo, unspecified ear: Secondary | ICD-10-CM | POA: Diagnosis not present

## 2020-03-08 DIAGNOSIS — J449 Chronic obstructive pulmonary disease, unspecified: Secondary | ICD-10-CM | POA: Diagnosis not present

## 2020-03-08 DIAGNOSIS — D649 Anemia, unspecified: Secondary | ICD-10-CM | POA: Diagnosis not present

## 2020-03-08 NOTE — Patient Outreach (Signed)
[facility]) Care Management  03/08/2020  SINIYA SEARSON 1941-05-07 [id number]   Received referral 03/06/2020 Initial Outreach 03/08/2020 Transition of care/Telephone Assessment (Declined)  RN spoke with pt today and introduced [facility] services and the purpose for today's call. Pt receptive to a discussion concerning her recent discharge from SNF. RN verified the involved services with [facility] and [facility]. Pt states the nurse visited this morning and reviewed all her medications so pt declined another review or verification with this RN case manager at this time. Pt has enough refills and sufficient transportation to all upcoming medical appointments (pending primary visit on Monday with Dr. Sabra Heck). Pt state her wound care was completed at that time. Pt without any complaints or issues at this time however grateful for the follow up call today. RN further inquired on pt's COPD as pt verified she is aware of the GREEN zone and this is were she is the category she falls into today with no symptoms once reviewed. RN offered to enroll pt into the program for closer monitoring for her COPD or any other needs that maybe needed with no cost and call can occur at her request. Pt declined indicating she is already with two agencies at this time and current with no needs at this time. Pt has a supportive family and someone checks on her daily as pt lives alone. RN further offered to send [facility] packet for possible services in the future however pt opt to decline.   Plan: Pt will alert pt's primary provider of her decline for [facility] services at this time and close case based upon pt's request.  Raina Mina, RN Care Management Coordinator [place] Office [phone]

## 2020-03-11 DIAGNOSIS — J449 Chronic obstructive pulmonary disease, unspecified: Secondary | ICD-10-CM | POA: Diagnosis not present

## 2020-03-11 DIAGNOSIS — Z87891 Personal history of nicotine dependence: Secondary | ICD-10-CM | POA: Diagnosis not present

## 2020-03-11 DIAGNOSIS — E7800 Pure hypercholesterolemia, unspecified: Secondary | ICD-10-CM | POA: Diagnosis not present

## 2020-03-11 DIAGNOSIS — D649 Anemia, unspecified: Secondary | ICD-10-CM | POA: Diagnosis not present

## 2020-03-11 DIAGNOSIS — I2510 Atherosclerotic heart disease of native coronary artery without angina pectoris: Secondary | ICD-10-CM | POA: Diagnosis not present

## 2020-03-11 DIAGNOSIS — S91312D Laceration without foreign body, left foot, subsequent encounter: Secondary | ICD-10-CM | POA: Diagnosis not present

## 2020-03-11 DIAGNOSIS — J9611 Chronic respiratory failure with hypoxia: Secondary | ICD-10-CM | POA: Diagnosis not present

## 2020-03-11 DIAGNOSIS — H3530 Unspecified macular degeneration: Secondary | ICD-10-CM | POA: Diagnosis not present

## 2020-03-11 DIAGNOSIS — L03116 Cellulitis of left lower limb: Secondary | ICD-10-CM | POA: Diagnosis not present

## 2020-03-13 DIAGNOSIS — J9611 Chronic respiratory failure with hypoxia: Secondary | ICD-10-CM | POA: Diagnosis not present

## 2020-03-13 DIAGNOSIS — H3530 Unspecified macular degeneration: Secondary | ICD-10-CM | POA: Diagnosis not present

## 2020-03-13 DIAGNOSIS — I2510 Atherosclerotic heart disease of native coronary artery without angina pectoris: Secondary | ICD-10-CM | POA: Diagnosis not present

## 2020-03-13 DIAGNOSIS — S91312D Laceration without foreign body, left foot, subsequent encounter: Secondary | ICD-10-CM | POA: Diagnosis not present

## 2020-03-13 DIAGNOSIS — J449 Chronic obstructive pulmonary disease, unspecified: Secondary | ICD-10-CM | POA: Diagnosis not present

## 2020-03-13 DIAGNOSIS — L03116 Cellulitis of left lower limb: Secondary | ICD-10-CM | POA: Diagnosis not present

## 2020-03-14 DIAGNOSIS — D649 Anemia, unspecified: Secondary | ICD-10-CM | POA: Diagnosis not present

## 2020-03-14 DIAGNOSIS — L0390 Cellulitis, unspecified: Secondary | ICD-10-CM | POA: Diagnosis not present

## 2020-03-15 DIAGNOSIS — L03116 Cellulitis of left lower limb: Secondary | ICD-10-CM | POA: Diagnosis not present

## 2020-03-15 DIAGNOSIS — J9611 Chronic respiratory failure with hypoxia: Secondary | ICD-10-CM | POA: Diagnosis not present

## 2020-03-15 DIAGNOSIS — I2510 Atherosclerotic heart disease of native coronary artery without angina pectoris: Secondary | ICD-10-CM | POA: Diagnosis not present

## 2020-03-15 DIAGNOSIS — S91312D Laceration without foreign body, left foot, subsequent encounter: Secondary | ICD-10-CM | POA: Diagnosis not present

## 2020-03-15 DIAGNOSIS — H3530 Unspecified macular degeneration: Secondary | ICD-10-CM | POA: Diagnosis not present

## 2020-03-15 DIAGNOSIS — J449 Chronic obstructive pulmonary disease, unspecified: Secondary | ICD-10-CM | POA: Diagnosis not present

## 2020-03-18 DIAGNOSIS — L03116 Cellulitis of left lower limb: Secondary | ICD-10-CM | POA: Diagnosis not present

## 2020-03-18 DIAGNOSIS — J449 Chronic obstructive pulmonary disease, unspecified: Secondary | ICD-10-CM | POA: Diagnosis not present

## 2020-03-18 DIAGNOSIS — J9611 Chronic respiratory failure with hypoxia: Secondary | ICD-10-CM | POA: Diagnosis not present

## 2020-03-18 DIAGNOSIS — S91312D Laceration without foreign body, left foot, subsequent encounter: Secondary | ICD-10-CM | POA: Diagnosis not present

## 2020-03-18 DIAGNOSIS — I2510 Atherosclerotic heart disease of native coronary artery without angina pectoris: Secondary | ICD-10-CM | POA: Diagnosis not present

## 2020-03-18 DIAGNOSIS — H3530 Unspecified macular degeneration: Secondary | ICD-10-CM | POA: Diagnosis not present

## 2020-03-20 DIAGNOSIS — J449 Chronic obstructive pulmonary disease, unspecified: Secondary | ICD-10-CM | POA: Diagnosis not present

## 2020-03-20 DIAGNOSIS — S91312D Laceration without foreign body, left foot, subsequent encounter: Secondary | ICD-10-CM | POA: Diagnosis not present

## 2020-03-20 DIAGNOSIS — H3530 Unspecified macular degeneration: Secondary | ICD-10-CM | POA: Diagnosis not present

## 2020-03-20 DIAGNOSIS — J9611 Chronic respiratory failure with hypoxia: Secondary | ICD-10-CM | POA: Diagnosis not present

## 2020-03-20 DIAGNOSIS — L03116 Cellulitis of left lower limb: Secondary | ICD-10-CM | POA: Diagnosis not present

## 2020-03-20 DIAGNOSIS — I2510 Atherosclerotic heart disease of native coronary artery without angina pectoris: Secondary | ICD-10-CM | POA: Diagnosis not present

## 2020-03-22 DIAGNOSIS — L03116 Cellulitis of left lower limb: Secondary | ICD-10-CM | POA: Diagnosis not present

## 2020-03-22 DIAGNOSIS — H3530 Unspecified macular degeneration: Secondary | ICD-10-CM | POA: Diagnosis not present

## 2020-03-22 DIAGNOSIS — J9611 Chronic respiratory failure with hypoxia: Secondary | ICD-10-CM | POA: Diagnosis not present

## 2020-03-22 DIAGNOSIS — J449 Chronic obstructive pulmonary disease, unspecified: Secondary | ICD-10-CM | POA: Diagnosis not present

## 2020-03-22 DIAGNOSIS — I2510 Atherosclerotic heart disease of native coronary artery without angina pectoris: Secondary | ICD-10-CM | POA: Diagnosis not present

## 2020-03-22 DIAGNOSIS — S91312D Laceration without foreign body, left foot, subsequent encounter: Secondary | ICD-10-CM | POA: Diagnosis not present

## 2020-03-26 DIAGNOSIS — J9611 Chronic respiratory failure with hypoxia: Secondary | ICD-10-CM | POA: Diagnosis not present

## 2020-03-26 DIAGNOSIS — J449 Chronic obstructive pulmonary disease, unspecified: Secondary | ICD-10-CM | POA: Diagnosis not present

## 2020-03-26 DIAGNOSIS — L03116 Cellulitis of left lower limb: Secondary | ICD-10-CM | POA: Diagnosis not present

## 2020-03-26 DIAGNOSIS — S91312D Laceration without foreign body, left foot, subsequent encounter: Secondary | ICD-10-CM | POA: Diagnosis not present

## 2020-03-26 DIAGNOSIS — I2510 Atherosclerotic heart disease of native coronary artery without angina pectoris: Secondary | ICD-10-CM | POA: Diagnosis not present

## 2020-03-26 DIAGNOSIS — H3530 Unspecified macular degeneration: Secondary | ICD-10-CM | POA: Diagnosis not present

## 2020-03-27 DIAGNOSIS — J9611 Chronic respiratory failure with hypoxia: Secondary | ICD-10-CM | POA: Diagnosis not present

## 2020-03-27 DIAGNOSIS — J449 Chronic obstructive pulmonary disease, unspecified: Secondary | ICD-10-CM | POA: Diagnosis not present

## 2020-03-27 DIAGNOSIS — Z0001 Encounter for general adult medical examination with abnormal findings: Secondary | ICD-10-CM | POA: Diagnosis not present

## 2020-03-27 DIAGNOSIS — Z0000 Encounter for general adult medical examination without abnormal findings: Secondary | ICD-10-CM | POA: Diagnosis not present

## 2020-03-27 DIAGNOSIS — Z87891 Personal history of nicotine dependence: Secondary | ICD-10-CM | POA: Diagnosis not present

## 2020-03-27 DIAGNOSIS — Z8719 Personal history of other diseases of the digestive system: Secondary | ICD-10-CM | POA: Diagnosis not present

## 2020-03-27 DIAGNOSIS — M810 Age-related osteoporosis without current pathological fracture: Secondary | ICD-10-CM | POA: Diagnosis not present

## 2020-03-27 DIAGNOSIS — E7800 Pure hypercholesterolemia, unspecified: Secondary | ICD-10-CM | POA: Diagnosis not present

## 2020-03-27 DIAGNOSIS — Z6824 Body mass index (BMI) 24.0-24.9, adult: Secondary | ICD-10-CM | POA: Diagnosis not present

## 2020-03-29 DIAGNOSIS — I2510 Atherosclerotic heart disease of native coronary artery without angina pectoris: Secondary | ICD-10-CM | POA: Diagnosis not present

## 2020-03-29 DIAGNOSIS — J9611 Chronic respiratory failure with hypoxia: Secondary | ICD-10-CM | POA: Diagnosis not present

## 2020-03-29 DIAGNOSIS — H3530 Unspecified macular degeneration: Secondary | ICD-10-CM | POA: Diagnosis not present

## 2020-03-29 DIAGNOSIS — L03116 Cellulitis of left lower limb: Secondary | ICD-10-CM | POA: Diagnosis not present

## 2020-03-29 DIAGNOSIS — S91312D Laceration without foreign body, left foot, subsequent encounter: Secondary | ICD-10-CM | POA: Diagnosis not present

## 2020-03-29 DIAGNOSIS — J449 Chronic obstructive pulmonary disease, unspecified: Secondary | ICD-10-CM | POA: Diagnosis not present

## 2020-04-02 ENCOUNTER — Other Ambulatory Visit: Payer: Self-pay | Admitting: Family Medicine

## 2020-04-02 DIAGNOSIS — L03116 Cellulitis of left lower limb: Secondary | ICD-10-CM | POA: Diagnosis not present

## 2020-04-02 DIAGNOSIS — I2510 Atherosclerotic heart disease of native coronary artery without angina pectoris: Secondary | ICD-10-CM | POA: Diagnosis not present

## 2020-04-02 DIAGNOSIS — J449 Chronic obstructive pulmonary disease, unspecified: Secondary | ICD-10-CM | POA: Diagnosis not present

## 2020-04-02 DIAGNOSIS — M810 Age-related osteoporosis without current pathological fracture: Secondary | ICD-10-CM

## 2020-04-02 DIAGNOSIS — J9611 Chronic respiratory failure with hypoxia: Secondary | ICD-10-CM | POA: Diagnosis not present

## 2020-04-02 DIAGNOSIS — S91312D Laceration without foreign body, left foot, subsequent encounter: Secondary | ICD-10-CM | POA: Diagnosis not present

## 2020-04-02 DIAGNOSIS — H3530 Unspecified macular degeneration: Secondary | ICD-10-CM | POA: Diagnosis not present

## 2020-04-04 DIAGNOSIS — H3530 Unspecified macular degeneration: Secondary | ICD-10-CM | POA: Diagnosis not present

## 2020-04-04 DIAGNOSIS — S91312D Laceration without foreign body, left foot, subsequent encounter: Secondary | ICD-10-CM | POA: Diagnosis not present

## 2020-04-04 DIAGNOSIS — L03116 Cellulitis of left lower limb: Secondary | ICD-10-CM | POA: Diagnosis not present

## 2020-04-04 DIAGNOSIS — J449 Chronic obstructive pulmonary disease, unspecified: Secondary | ICD-10-CM | POA: Diagnosis not present

## 2020-04-04 DIAGNOSIS — I2510 Atherosclerotic heart disease of native coronary artery without angina pectoris: Secondary | ICD-10-CM | POA: Diagnosis not present

## 2020-04-04 DIAGNOSIS — J9611 Chronic respiratory failure with hypoxia: Secondary | ICD-10-CM | POA: Diagnosis not present

## 2020-04-07 DIAGNOSIS — Z9981 Dependence on supplemental oxygen: Secondary | ICD-10-CM | POA: Diagnosis not present

## 2020-04-07 DIAGNOSIS — J449 Chronic obstructive pulmonary disease, unspecified: Secondary | ICD-10-CM | POA: Diagnosis not present

## 2020-04-07 DIAGNOSIS — H3530 Unspecified macular degeneration: Secondary | ICD-10-CM | POA: Diagnosis not present

## 2020-04-07 DIAGNOSIS — S91312D Laceration without foreign body, left foot, subsequent encounter: Secondary | ICD-10-CM | POA: Diagnosis not present

## 2020-04-07 DIAGNOSIS — J9611 Chronic respiratory failure with hypoxia: Secondary | ICD-10-CM | POA: Diagnosis not present

## 2020-04-07 DIAGNOSIS — H8110 Benign paroxysmal vertigo, unspecified ear: Secondary | ICD-10-CM | POA: Diagnosis not present

## 2020-04-07 DIAGNOSIS — E785 Hyperlipidemia, unspecified: Secondary | ICD-10-CM | POA: Diagnosis not present

## 2020-04-07 DIAGNOSIS — L03116 Cellulitis of left lower limb: Secondary | ICD-10-CM | POA: Diagnosis not present

## 2020-04-07 DIAGNOSIS — W19XXXD Unspecified fall, subsequent encounter: Secondary | ICD-10-CM | POA: Diagnosis not present

## 2020-04-07 DIAGNOSIS — D649 Anemia, unspecified: Secondary | ICD-10-CM | POA: Diagnosis not present

## 2020-04-07 DIAGNOSIS — Z87442 Personal history of urinary calculi: Secondary | ICD-10-CM | POA: Diagnosis not present

## 2020-04-07 DIAGNOSIS — I5031 Acute diastolic (congestive) heart failure: Secondary | ICD-10-CM | POA: Diagnosis not present

## 2020-04-07 DIAGNOSIS — I2510 Atherosclerotic heart disease of native coronary artery without angina pectoris: Secondary | ICD-10-CM | POA: Diagnosis not present

## 2020-05-06 IMAGING — DX DG CHEST 1V PORT
1 series · 1 of 1 positions shown · non-contrast
Comparison: 12/13/2016

CLINICAL DATA: Shortness of breath and chest tightness 1 day.

EXAM:
PORTABLE CHEST 1 VIEW

[chest]
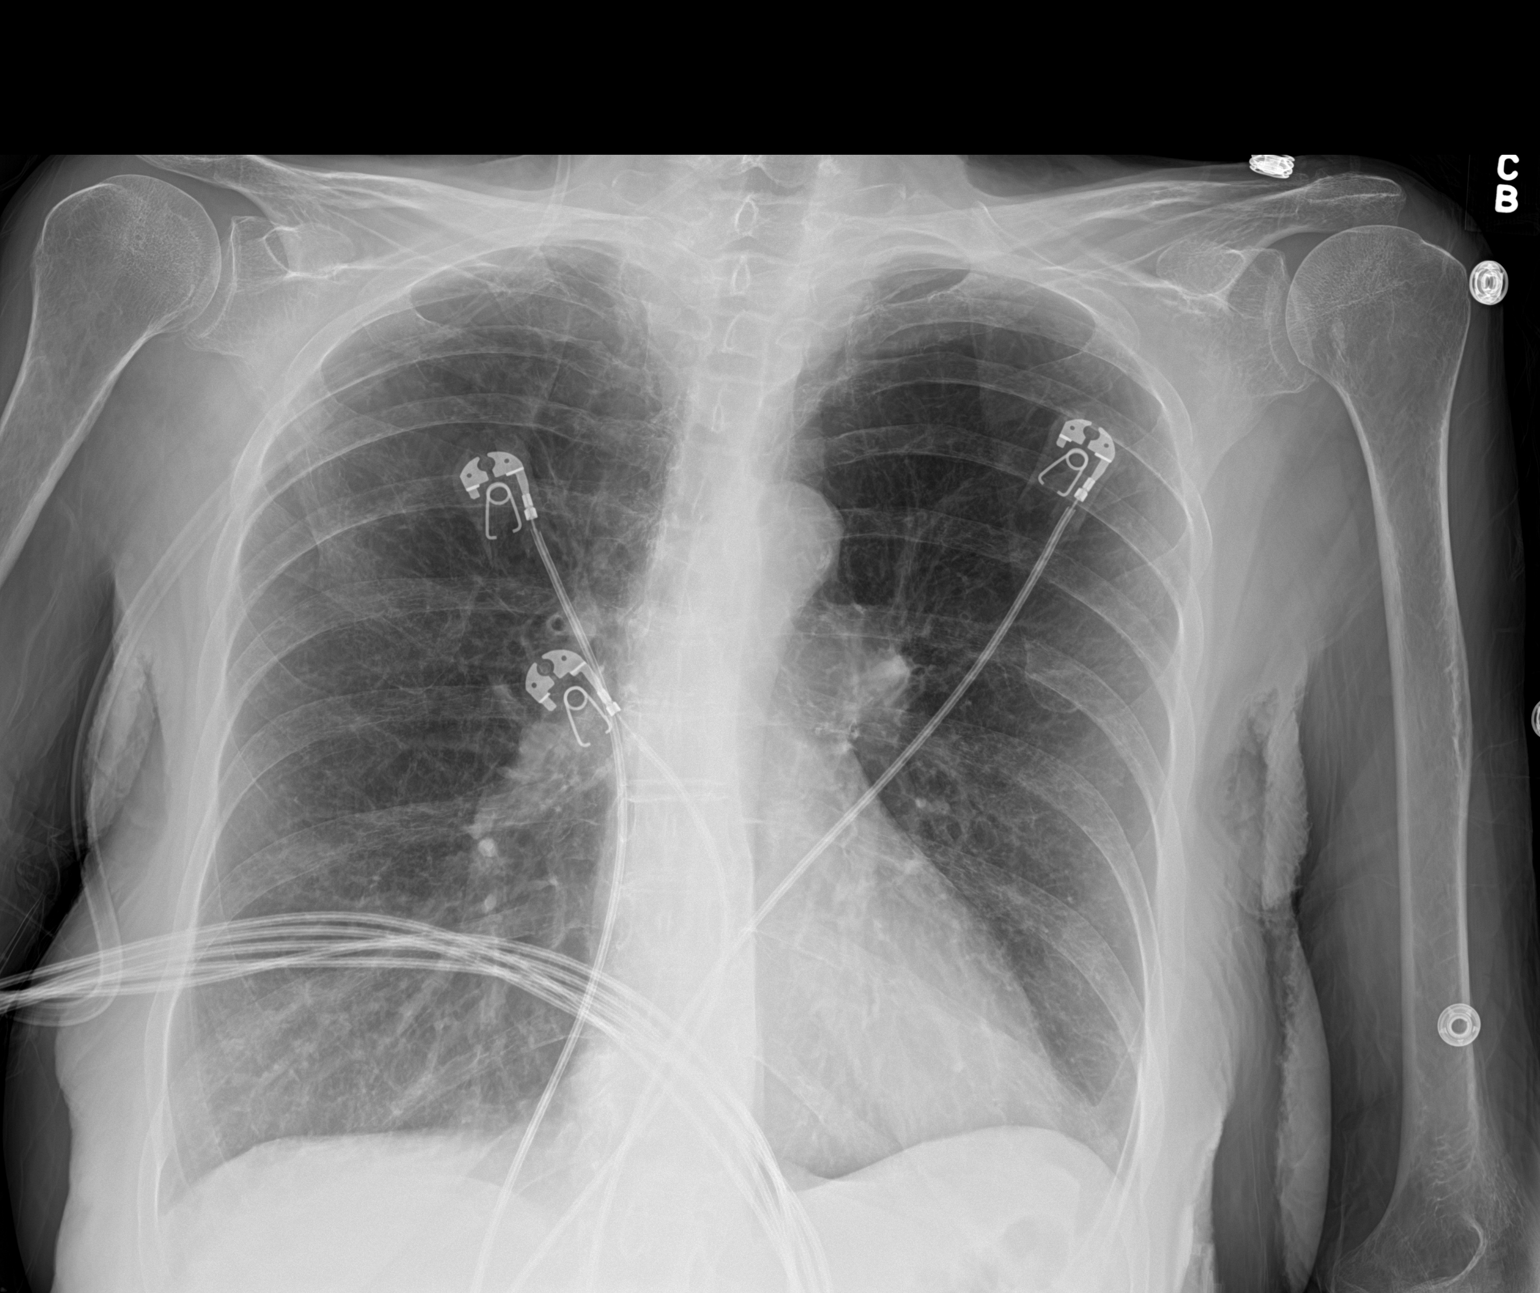

[1 of 1 positions shown; findings below may reference images not displayed]

FINDINGS: Lungs are adequately inflated without focal airspace consolidation
or effusion. Mild increased lucency over the mid to upper lungs
suggesting emphysematous disease. Cardiomediastinal silhouette and
remainder of the exam is unchanged.
IMPRESSION: No active disease.

## 2020-06-18 ENCOUNTER — Other Ambulatory Visit: Payer: Self-pay

## 2020-06-18 ENCOUNTER — Ambulatory Visit
Admission: RE | Admit: 2020-06-18 | Discharge: 2020-06-18 | Disposition: A | Discharge status: Home / Self Care | Payer: Medicare Other | Source: Ambulatory Visit | Attending: Family Medicine | Admitting: Family Medicine

## 2020-06-18 DIAGNOSIS — Z780 Asymptomatic menopausal state: Secondary | ICD-10-CM | POA: Diagnosis not present

## 2020-06-18 DIAGNOSIS — M810 Age-related osteoporosis without current pathological fracture: Secondary | ICD-10-CM

## 2020-06-18 DIAGNOSIS — M8588 Other specified disorders of bone density and structure, other site: Secondary | ICD-10-CM | POA: Diagnosis not present

## 2020-06-25 ENCOUNTER — Telehealth: Payer: Self-pay | Admitting: *Deleted

## 2020-06-25 NOTE — Telephone Encounter (Signed)
A message was left, re: her follow up visit. 

## 2020-07-04 ENCOUNTER — Telehealth: Payer: Self-pay | Admitting: Cardiology

## 2020-07-04 NOTE — Telephone Encounter (Signed)
LVM for patient to return call to get follow up scheduled with [facility] from recall list 

## 2020-07-05 ENCOUNTER — Other Ambulatory Visit: Payer: Self-pay | Admitting: Cardiology

## 2020-07-10 ENCOUNTER — Telehealth: Payer: Self-pay | Admitting: Gastroenterology

## 2020-07-10 NOTE — Telephone Encounter (Signed)
Please let Dr. Ammie Ferrier office know that patient was due for na office visit this spring to discuss.  Generally at age 79 screenings stop.  Patient did not return calls to schedule OV

## 2020-07-19 DIAGNOSIS — H353211 Exudative age-related macular degeneration, right eye, with active choroidal neovascularization: Secondary | ICD-10-CM | POA: Diagnosis not present

## 2020-07-19 DIAGNOSIS — H1789 Other corneal scars and opacities: Secondary | ICD-10-CM | POA: Diagnosis not present

## 2020-07-19 DIAGNOSIS — H2522 Age-related cataract, morgagnian type, left eye: Secondary | ICD-10-CM | POA: Diagnosis not present

## 2020-07-19 DIAGNOSIS — H53002 Unspecified amblyopia, left eye: Secondary | ICD-10-CM | POA: Diagnosis not present

## 2020-07-19 DIAGNOSIS — H35361 Drusen (degenerative) of macula, right eye: Secondary | ICD-10-CM | POA: Diagnosis not present

## 2020-07-19 DIAGNOSIS — H5315 Visual distortions of shape and size: Secondary | ICD-10-CM | POA: Diagnosis not present

## 2020-07-19 DIAGNOSIS — H35721 Serous detachment of retinal pigment epithelium, right eye: Secondary | ICD-10-CM | POA: Diagnosis not present

## 2020-07-26 DIAGNOSIS — Z23 Encounter for immunization: Secondary | ICD-10-CM | POA: Diagnosis not present

## 2020-08-23 DIAGNOSIS — H353211 Exudative age-related macular degeneration, right eye, with active choroidal neovascularization: Secondary | ICD-10-CM | POA: Diagnosis not present

## 2020-09-06 DIAGNOSIS — S80212A Abrasion, left knee, initial encounter: Secondary | ICD-10-CM | POA: Diagnosis not present

## 2020-09-16 ENCOUNTER — Telehealth: Payer: Self-pay | Admitting: Internal Medicine

## 2020-09-16 MED ORDER — INCRUSE ELLIPTA 62.5 MCG/INH IN AEPB: 1.0000 | INHALATION_SPRAY | Freq: Every evening | RESPIRATORY_TRACT | 1 refills | Status: DC

## 2020-09-16 NOTE — Telephone Encounter (Signed)
Called and spoke to patient, who is requesting refill on Incruse.  Rx has been sent to preferred pharmacy.  Patient is aware that an appointment is needed for further refills, as she is past due for an appointment. Last seen 10/2019 and was instructed to f/u 12/2019. Patient will call back to schedule an appointment, as she has to coordinate transportation.  Nothing further needed.

## 2020-09-17 ENCOUNTER — Telehealth: Payer: Self-pay | Admitting: Internal Medicine

## 2020-09-17 NOTE — Telephone Encounter (Signed)
LMTCB x1 for pt.  

## 2020-09-19 NOTE — Telephone Encounter (Signed)
Left detailed message for pt that I do not see anything in the pts chart that is indicating a call made to the pt that still needs attention. Pt was instructed to call office if there is anything further needed. Will sign off on message.

## 2020-09-28 DIAGNOSIS — Z23 Encounter for immunization: Secondary | ICD-10-CM | POA: Diagnosis not present

## 2020-10-24 ENCOUNTER — Ambulatory Visit (INDEPENDENT_AMBULATORY_CARE_PROVIDER_SITE_OTHER): Payer: Medicare Other | Admitting: Cardiology

## 2020-10-24 ENCOUNTER — Other Ambulatory Visit: Payer: Self-pay

## 2020-10-24 ENCOUNTER — Encounter: Payer: Self-pay | Admitting: Cardiology

## 2020-10-24 VITALS — BP 144/75 | HR 116 | Ht 62.0 in | Wt 144.0 lb

## 2020-10-24 DIAGNOSIS — I2584 Coronary atherosclerosis due to calcified coronary lesion: Secondary | ICD-10-CM | POA: Diagnosis not present

## 2020-10-24 DIAGNOSIS — R609 Edema, unspecified: Secondary | ICD-10-CM | POA: Diagnosis not present

## 2020-10-24 DIAGNOSIS — E785 Hyperlipidemia, unspecified: Secondary | ICD-10-CM | POA: Diagnosis not present

## 2020-10-24 DIAGNOSIS — R600 Localized edema: Secondary | ICD-10-CM | POA: Diagnosis not present

## 2020-10-24 DIAGNOSIS — R0609 Other forms of dyspnea: Secondary | ICD-10-CM

## 2020-10-24 DIAGNOSIS — I2510 Atherosclerotic heart disease of native coronary artery without angina pectoris: Secondary | ICD-10-CM | POA: Diagnosis not present

## 2020-10-24 DIAGNOSIS — F17200 Nicotine dependence, unspecified, uncomplicated: Secondary | ICD-10-CM

## 2020-10-24 DIAGNOSIS — R0600 Dyspnea, unspecified: Secondary | ICD-10-CM

## 2020-10-24 DIAGNOSIS — J449 Chronic obstructive pulmonary disease, unspecified: Secondary | ICD-10-CM | POA: Diagnosis not present

## 2020-10-24 MED ORDER — FUROSEMIDE 40 MG PO TABS: ORAL_TABLET | ORAL | 3 refills | Status: DC

## 2020-10-24 NOTE — Patient Instructions (Addendum)
Medication Instructions:  No changes with medication  you can double your  Fluid pill Furosemide  in need for swelling or shortness of breath   *If you need a refill on your cardiac medications before your next appointment, please call your pharmacy*   Lab Work: cmp lipid If you have labs (blood work) drawn today and your tests are completely normal, you will receive your results only by: [name] MyChart Message (if you have MyChart) OR . A paper copy in the mail If you have any lab test that is abnormal or we need to change your treatment, we will call you to review the results.   Testing/Procedures:  Not needed  Follow-Up: At [facility], you and your health needs are our priority.  As part of our continuing mission to provide you with exceptional heart care, we have created designated Provider Care Teams.  These Care Teams include your primary Cardiologist (physician) and Advanced Practice Providers (APPs -  Physician Assistants and Nurse Practitioners) who all work together to provide you with the care you need, when you need it.     Your next appointment:   12 month(s)  The format for your next appointment:   In Person  Provider:   Glenetta Hew, MD   Other Instructions

## 2020-10-24 NOTE — Progress Notes (Signed)
Primary Care Provider: Kathyrn Lass, MD Cardiologist: Glenetta Hew, MD Electrophysiologist: None  Clinic Note: Chief Complaint  Patient presents with  . Follow-up    Delayed "annual "  . Coronary Artery Disease    Coronary calcification noted on CT scan-negative Myoview 10/2016    Problem List Items Addressed This Visit    Hyperlipidemia LDL goal <100 (Chronic)   Coronary artery calcification - Primary (Chronic)   TOBACCO USE   COPD Gold II D, still smoking   Edema   Bilateral edema of lower extremity      HPI:    Valerie Gay is a 79 y.o. female with a PMH notable for COPD Gold stage II (on 3L oxygen) and initially seen for coronary calcification on CT scan.  She presents today for delayed annual follow-up although I have not personally seen her since January 2018.[name]   Myoview 10/2016 : low risk with no ischemia or infarction.  Echocardiogram August 2020 showed normal LV function.  Has lower extremity edema-recommended support hose/Compression stockings  [name] was last seen on July 25, 2019 by Kerin Ransom, [place] via telemedicine visit: Noted edema.  2 L O2 [place].  Recommend LDL less than 100.  Also recommended support socks and increasing Lasix to 40 mg daily as opposed to 20 mg twice daily.  Reduce salt intake.  Recent Hospitalizations:   January 23-25, oral 2020 admitted for acute on chronic hypoxic restaurant failure.  3/8 - 01/26/2020:ER visit/hospital admission in March 2021 for left lower leg extremity cellulitis. ->  Discharged to [facility] where she stayed until April 21  Started on vancomycin converted to ceftriaxone and doxycycline.  Recommended dressing changes.  Given gentle diuresis and then placed on standing no medication 40 mg daily.  Reviewed  CV studies:    The following studies were reviewed today: (if available, images/films reviewed: From Epic Chart or Care Everywhere) . ECHO 06/29/2019: EF 60 to 65%.  No R WMA.   Normal RV function.  Mild calcification of aortic valve but no stenosis.  Mild mitral valve leaflet calcification but no stenosis or regurgitation.  Interval History:   [name] returns for routine follow-up really with no major cardiac symptoms.  She denies any chest pain or pressure at rest or exertion.  She has chronic dyspnea-worse with exertion, but no real PND or orthopnea.  She only has right ankle swelling not bilateral.  Swelling goes down with foot elevation.  CV Review of Symptoms (Summary): positive for - dyspnea on exertion, shortness of breath and Baseline dyspnea, right ankle only swelling negative for - chest pain, irregular heartbeat, orthopnea, palpitations, paroxysmal nocturnal dyspnea, rapid heart rate or Syncope/near syncope, TIA/amaurosis fugax, claudication  The patient does have symptoms concerning for COVID-19 infection (fever, chills, cough, or new shortness of breath).   REVIEWED OF SYSTEMS   Review of Systems  Constitutional: Positive for malaise/fatigue (always a little tired). Negative for weight loss (continued wgt gain).  HENT: Negative for congestion and nosebleeds.   Respiratory: Positive for shortness of breath (baseline).   Cardiovascular: Negative for claudication.  Gastrointestinal: Negative for blood in stool, constipation and melena.  Genitourinary: Negative for hematuria.  Musculoskeletal: Positive for joint pain.  Neurological: Negative for dizziness (only if stands up too quickly) and focal weakness.  Psychiatric/Behavioral: Negative for depression and memory loss. The patient is not nervous/anxious and does not have insomnia.    I have reviewed and (if needed) personally updated the patient's problem list, medications, allergies,  past medical and surgical history, social and family history.   PAST MEDICAL HISTORY   Past Medical History:  Diagnosis Date  . Anemia age 57  . Cellulitis and abscess of foot 03/2017   rt foot  .  Colon polyps    2010   . COPD (chronic obstructive pulmonary disease) ([place])   . Diverticulosis   . History of hiatal hernia   . History of kidney stones   . Hyperlipidemia   . On home oxygen therapy     PAST SURGICAL HISTORY   Past Surgical History:  Procedure Laterality Date  . BALLOON DILATION N/A 03/08/2017   Procedure: BALLOON DILATION;  Surgeon: Manus Gunning, MD;  Location: [facility];  Service: Gastroenterology;  Laterality: N/A;  . CATARACT EXTRACTION Right    August 15, 2013  . CHOLECYSTECTOMY N/A 06/22/2014   Procedure: LAPAROSCOPIC CHOLECYSTECTOMY WITH INTRAOPERATIVE CHOLANGIOGRAM;  Surgeon: Gayland Curry, MD;  Location: [place];  Service: General;  Laterality: N/A;  . COLONOSCOPY N/A 02/19/2017   Procedure: COLONOSCOPY;  Surgeon: Manus Gunning, MD;  Location: [facility];  Service: Gastroenterology;  Laterality: N/A;  . ESOPHAGOGASTRODUODENOSCOPY N/A 02/19/2017   Procedure: ESOPHAGOGASTRODUODENOSCOPY (EGD);  Surgeon: Manus Gunning, MD;  Location: [facility];  Service: Gastroenterology;  Laterality: N/A;  . ESOPHAGOGASTRODUODENOSCOPY N/A 03/08/2017   Procedure: ESOPHAGOGASTRODUODENOSCOPY (EGD);  Surgeon: Manus Gunning, MD;  Location: [facility];  Service: Gastroenterology;  Laterality: N/A;  . ESOPHAGOGASTRODUODENOSCOPY N/A 04/06/2017   Procedure: ESOPHAGOGASTRODUODENOSCOPY (EGD);  Surgeon: Manus Gunning, MD;  Location: [facility];  Service: Gastroenterology;  Laterality: N/A;  . ESOPHAGOGASTRODUODENOSCOPY (EGD) WITH PROPOFOL N/A 04/27/2017   Procedure: ESOPHAGOGASTRODUODENOSCOPY (EGD) WITH PROPOFOL;  Surgeon: Manus Gunning, MD;  Location: [facility];  Service: Gastroenterology;  Laterality: N/A;  . NM MYOVIEW LTD  10/2016    EF 55-65% with normal LV function. LOW RISK. No ischemia or infarction.  . TONSILLECTOMY    . TRANSTHORACIC ECHOCARDIOGRAM  06/2019   EF 60 to 65%.  No R WMA.  Normal RV function.  Mild  calcification of aortic valve but no stenosis.  Mild mitral valve leaflet calcification but no stenosis or regurgitation.    Immunization History  Administered Date(s) Administered  . Fluad Quad(high Dose 65+) 06/19/2019  . H1N1 11/05/2008  . Influenza Whole 07/29/2014  . Influenza, High Dose Seasonal PF 10/18/2018  . Influenza,inj,Quad PF,6+ Mos 09/27/2015, 12/01/2016  . Pneumococcal Polysaccharide-23 03/28/2014    MEDICATIONS/ALLERGIES   Current Meds  Medication Sig  . acetaminophen (TYLENOL) 325 MG tablet Take 2 tablets (650 mg total) by mouth every 6 (six) hours as needed for mild pain (or Fever >/= 101).  [name] albuterol (PROVENTIL HFA;VENTOLIN HFA) 108 (90 Base) MCG/ACT inhaler Inhale 2 puffs into the lungs every 6 (six) hours as needed for wheezing or shortness of breath.  [name] albuterol (PROVENTIL) (2.5 MG/3ML) 0.083% nebulizer solution USE 1 VIAL VIA NEBULIZER EVERY 8 HOURS AS NEEDED FOR WHEEZING (Patient taking differently: Take 2.5 mg by nebulization every 8 (eight) hours as needed for wheezing. )  . Aloe-Sodium Chloride (AYR SALINE NASAL GEL NA) Place 1 application into the nose 6 (six) times daily.  [name] doxycycline (VIBRA-TABS) 100 MG tablet Take 1 tablet (100 mg total) by mouth every 12 (twelve) hours.  . furosemide (LASIX) 40 MG tablet TAKE 1 TABLET(40 MG) BY MOUTH DAILY.may take an additional 40 mg if need for increase swelling or shortness of breath  . hydroxypropyl methylcellulose / hypromellose (ISOPTO TEARS / GONIOVISC) 2.5 %  ophthalmic solution Place 2 drops into both eyes 3 (three) times daily as needed for dry eyes.  . magnesium citrate SOLN Take by mouth daily as needed for severe constipation (constipation).  . OXYGEN Inhale 3 L into the lungs continuous.   . polyethylene glycol (MIRALAX / GLYCOLAX) 17 g packet Take 17 g by mouth daily as needed for mild constipation.  . sodium chloride (AYR) 0.65 % nasal spray Place 1 spray into the nose 6 (six) times daily.  . SYMBICORT  160-4.5 MCG/ACT inhaler USE 2 PUFFS BY MOUTH TWO  TIMES DAILY (Patient taking differently: Inhale 2 puffs into the lungs 2 (two) times daily. )  . umeclidinium bromide (INCRUSE ELLIPTA) 62.5 MCG/INH AEPB Inhale 1 puff into the lungs every evening.  [name] UNKNOWN TO PATIENT Place into the right eye See admin instructions. Eye injection (name unknown to patient): Inject into the right eye every 7-8 weeks for wet macular degeneration  . [DISCONTINUED] furosemide (LASIX) 40 MG tablet TAKE 1 TABLET(40 MG) BY MOUTH DAILY    Allergies  Allergen Reactions  . Spiriva Handihaler [Tiotropium Bromide Monohydrate] Hives, Itching and Rash     on upper body  . Banana Itching    SOCIAL HISTORY/FAMILY HISTORY   Reviewed in Epic:  Pertinent findings: No change Social History   Tobacco Use  . Smoking status: Former Smoker    Packs/day: 0.50    Years: 60.00    Pack years: 30.00    Types: Cigarettes    Quit date: 02/06/2019    Years since quitting: 1.7  . Smokeless tobacco: Never Used  . Tobacco comment: 01/17/19 down to 1-2 cigs per day  Vaping Use  . Vaping Use: Never used  Substance Use Topics  . Alcohol use: No    Alcohol/week: 0.0 standard drinks  . Drug use: No    OBJCTIVE -PE, EKG, labs   Wt Readings from Last 3 Encounters:  10/24/20 144 lb (65.3 kg)  01/22/20 130 lb (59 kg)  07/25/19 122 lb (55.3 kg)    Physical Exam: BP (!) 144/75   Pulse (!) 116   Ht 5\' 2"  (1.575 m)   Wt 144 lb (65.3 kg)   SpO2 93%   BMI 26.34 kg/m  Physical Exam Vitals reviewed.  Constitutional:      General: She is not in acute distress.    Appearance: Normal appearance. She is normal weight. She is not ill-appearing or toxic-appearing.     Comments: Appears of stated age  HENT:     Head: Normocephalic and atraumatic.  Neck:     Vascular: No carotid bruit, hepatojugular reflux or JVD.  Cardiovascular:     Rate and Rhythm: Regular rhythm. Tachycardia present.  No extrasystoles are present.    Chest  Wall: PMI is not displaced.     Pulses: Intact distal pulses.     Heart sounds: S1 normal and S2 normal. Heart sounds are distant. No murmur heard. No friction rub. No gallop.   Musculoskeletal:     Cervical back: Normal range of motion and neck supple.  Neurological:     Mental Status: She is alert.     Adult ECG Report  Rate: 116 ;  Rhythm: sinus tachycardia and Low voltage.  EKG is read as cannot rule out anterior infarct of age-indeterminate.  However does not meet criteria.;   Narrative Interpretation: Tachycardia new, otherwise stable.  Recent Labs: No recent labs - ordered today / reviewed Lab Results  Component Value Date  CHOL 216 (H) 10/24/2020   HDL 70 10/24/2020   LDLCALC 131 (H) 10/24/2020   TRIG 86 10/24/2020   CHOLHDL 3.1 10/24/2020   Lab Results  Component Value Date   CREATININE 0.92 10/24/2020   BUN 19 10/24/2020   NA 142 10/24/2020   K 4.3 10/24/2020   CL 99 10/24/2020   CO2 28 10/24/2020    ASSESSMENT/PLAN    Problem List Items Addressed This Visit    Hyperlipidemia LDL goal <100 (Chronic)    He has known CAD, likely nonobstructive by Myoview.  LDL was previously not at goal.  She is still trying to avoid medications, but has not had labs checked in some time.  Plan: Check lipid and chemistry panel -> if elevated, may recommend low-dose statin. Continue diet and exercise.       Relevant Medications   furosemide (LASIX) 40 MG tablet   Other Relevant Orders   Lipid panel (Completed)   Comprehensive metabolic panel (Completed)   TOBACCO USE (Chronic)    Not really interested in quitting.  I counseled her briefly, but was not interested.      COPD Gold II D, still smoking (Chronic)    Followed by PCCM.      Coronary artery calcification - Primary (Chronic)    Long-term smoker with dyslipidemia borderline hypertension with coronary calcium score on CT scan but negative Myoview.  Continue risk factor modification -> target LDL less than 112,  closer to 70.  Plan: Monitor lipids and consider statin  Blood pressure borderline elevated today, but in the effort to try to avoid orthostatic hypotension we will hold off on further titration of medications.  Would consider calcium channel blocker such as diltiazem for some rate control of blood pressure control.  Calcium level not high enough to recommend standing dose of aspirin.      Relevant Medications   furosemide (LASIX) 40 MG tablet   Other Relevant Orders   EKG 12-Lead (Completed)   Lipid panel (Completed)   Comprehensive metabolic panel (Completed)   DOE (dyspnea on exertion) (Chronic)    More COPD than any cardiac disease.      Bilateral edema of lower extremity (Chronic)    Bilateral, right greater than left ankle edema.  Probably more related to venous stasis than right heart failure.  Currently on furosemide daily.  I told her that she could take additional dose if necessary.  Also talked foot elevation and support stockings.  Check chemistry panel      Relevant Orders   Comprehensive metabolic panel (Completed)       COVID-19 Education: The signs and symptoms of COVID-19 were discussed with the patient and how to seek care for testing (follow up with PCP or arrange E-visit).   The importance of social distancing and COVID-19 vaccination was discussed today. 1 min The patient is practicing social distancing & Masking.   I spent a total of 1minutes with the patient spent in direct patient consultation.  Additional time spent with chart review  / charting (studies, outside notes, etc): 10 Total Time: 37 min   Current medicines are reviewed at length with the patient today.  (+/- concerns) na  This visit occurred during the SARS-CoV-2 public health emergency.  Safety protocols were in place, including screening questions prior to the visit, additional usage of staff PPE, and extensive cleaning of exam room while observing appropriate contact time as  indicated for disinfecting solutions.  Notice: This dictation was prepared with Dragon dictation  along with smaller phrase technology. Any transcriptional errors that result from this process are unintentional and may not be corrected upon review.  Patient Instructions / Medication Changes & Studies & Tests Ordered   Patient Instructions  Medication Instructions:  No changes with medication  you can double your  Fluid pill Furosemide  in need for swelling or shortness of breath   *If you need a refill on your cardiac medications before your next appointment, please call your pharmacy*   Lab Work: cmp lipid If you have labs (blood work) drawn today and your tests are completely normal, you will receive your results only by: [name] MyChart Message (if you have MyChart) OR . A paper copy in the mail If you have any lab test that is abnormal or we need to change your treatment, we will call you to review the results.   Testing/Procedures:  Not needed  Follow-Up: At [facility], you and your health needs are our priority.  As part of our continuing mission to provide you with exceptional heart care, we have created designated Provider Care Teams.  These Care Teams include your primary Cardiologist (physician) and Advanced Practice Providers (APPs -  Physician Assistants and Nurse Practitioners) who all work together to provide you with the care you need, when you need it.     Your next appointment:   12 month(s)  The format for your next appointment:   In Person  Provider:   Glenetta Hew, MD   Other Instructions   Studies Ordered:   Orders Placed This Encounter  Procedures  . Lipid panel  . Comprehensive metabolic panel  . EKG 12-Lead     Glenetta Hew, M.D., M.S. Interventional Cardiologist   Pager # [phone] Phone # [phone] [address]. [place], [place] [address]   Thank you for choosing Heartcare at [facility]!!

## 2020-10-25 LAB — COMPREHENSIVE METABOLIC PANEL
ALT: 6 IU/L (ref 0–32)
AST: 10 IU/L (ref 0–40)
Albumin/Globulin Ratio: 1.6 (ref 1.2–2.2)
Albumin: 4.2 g/dL (ref 3.6–4.6)
Alkaline Phosphatase: 99 IU/L (ref 44–121)
BUN/Creatinine Ratio: 21 (ref 12–28)
BUN: 19 mg/dL (ref 8–27)
Bilirubin Total: 0.6 mg/dL (ref 0.0–1.2)
CO2: 28 mmol/L (ref 20–29)
Calcium: 9.6 mg/dL (ref 8.7–10.3)
Chloride: 99 mmol/L (ref 96–106)
Creatinine, Ser: 0.92 mg/dL (ref 0.57–1.00)
GFR calc Af Amer: 68 mL/min/{1.73_m2} (ref >59)
GFR calc non Af Amer: 59 mL/min/{1.73_m2} — ABNORMAL LOW (ref >59)
Globulin, Total: 2.7 g/dL (ref 1.5–4.5)
Glucose: 96 mg/dL (ref 65–99)
Potassium: 4.3 mmol/L (ref 3.5–5.2)
Sodium: 142 mmol/L (ref 134–144)
Total Protein: 6.9 g/dL (ref 6.0–8.5)

## 2020-10-25 LAB — LIPID PANEL
Chol/HDL Ratio: 3.1 ratio (ref 0.0–4.4)
Cholesterol, Total: 216 mg/dL — ABNORMAL HIGH (ref 100–199)
HDL: 70 mg/dL (ref >39)
LDL Chol Calc (NIH): 131 mg/dL — ABNORMAL HIGH (ref 0–99)
Triglycerides: 86 mg/dL (ref 0–149)
VLDL Cholesterol Cal: 15 mg/dL (ref 5–40)

## 2020-11-12 ENCOUNTER — Encounter: Payer: Self-pay | Admitting: Cardiology

## 2020-11-12 NOTE — Assessment & Plan Note (Signed)
Bilateral, right greater than left ankle edema.  Probably more related to venous stasis than right heart failure.  Currently on furosemide daily.  I told her that she could take additional dose if necessary.  Also talked foot elevation and support stockings.  Check chemistry panel

## 2020-11-12 NOTE — Assessment & Plan Note (Deleted)
Bilateral right greater than left ankle edema.  Probably more related to venous stasis than right heart failure.  Currently on furosemide daily.  I told her that she could take additional dose if necessary.  Also talked foot elevation and support stockings.

## 2020-11-12 NOTE — Assessment & Plan Note (Signed)
Followed by PCCM.

## 2020-11-12 NOTE — Assessment & Plan Note (Signed)
Long-term smoker with dyslipidemia borderline hypertension with coronary calcium score on CT scan but negative Myoview.  Continue risk factor modification -> target LDL less than 112, closer to 70.  Plan: Monitor lipids and consider statin  Blood pressure borderline elevated today, but in the effort to try to avoid orthostatic hypotension we will hold off on further titration of medications.  Would consider calcium channel blocker such as diltiazem for some rate control of blood pressure control.  Calcium level not high enough to recommend standing dose of aspirin.

## 2020-11-12 NOTE — Assessment & Plan Note (Signed)
He has known CAD, likely nonobstructive by Myoview.  LDL was previously not at goal.  She is still trying to avoid medications, but has not had labs checked in some time.  Plan: Check lipid and chemistry panel -> if elevated, may recommend low-dose statin. Continue diet and exercise.

## 2020-11-12 NOTE — Assessment & Plan Note (Signed)
Not really interested in quitting.  I counseled her briefly, but was not interested.

## 2020-11-12 NOTE — Assessment & Plan Note (Signed)
More COPD than any cardiac disease.

## 2020-11-13 ENCOUNTER — Telehealth: Payer: Self-pay | Admitting: *Deleted

## 2020-11-13 NOTE — Telephone Encounter (Signed)
Left message to call back - for results- labs  An to add new medication for reduce cholesterol

## 2020-11-13 NOTE — Telephone Encounter (Signed)
-----   Message from Marykay Lex, MD sent at 11/12/2020  1:29 PM EST ----- Cholesterol level is not low would like it.  Took cholesterol 216 with LDL of 131.    With coronary artery calcification, we would like to see the LDL below 100.  I would like to try starting low-dose rosuvastatin 10 mg daily. Rx: Rosuvastatin 10 mg p.o. daily, dispense 30 tabs with 11 refills.  Recheck lipid panel in 4 months.

## 2020-11-24 ENCOUNTER — Other Ambulatory Visit: Payer: Self-pay | Admitting: Internal Medicine

## 2020-12-25 ENCOUNTER — Telehealth: Payer: Self-pay | Admitting: Internal Medicine

## 2020-12-25 DIAGNOSIS — J449 Chronic obstructive pulmonary disease, unspecified: Secondary | ICD-10-CM

## 2020-12-25 NOTE — Telephone Encounter (Signed)
Order has been placed for the DME company to get the pt set up with a new nebulizer machine.  Called and spoke with pt and she is aware

## 2021-01-10 ENCOUNTER — Other Ambulatory Visit: Payer: Self-pay | Admitting: Internal Medicine

## 2021-01-10 ENCOUNTER — Telehealth: Payer: Self-pay | Admitting: Internal Medicine

## 2021-01-10 MED ORDER — BUDESONIDE-FORMOTEROL FUMARATE 160-4.5 MCG/ACT IN AERO: INHALATION_SPRAY | RESPIRATORY_TRACT | 0 refills | Status: DC

## 2021-01-10 NOTE — Telephone Encounter (Signed)
I called the pt and she stated that she scheduled her appt with MR on 04/01 and refills have been sent in for the symbicort.

## 2021-01-31 ENCOUNTER — Other Ambulatory Visit: Payer: Self-pay | Admitting: Internal Medicine

## 2021-02-14 ENCOUNTER — Other Ambulatory Visit: Payer: Self-pay

## 2021-02-14 ENCOUNTER — Ambulatory Visit (INDEPENDENT_AMBULATORY_CARE_PROVIDER_SITE_OTHER): Payer: Medicare Other | Admitting: Internal Medicine

## 2021-02-14 ENCOUNTER — Encounter: Payer: Self-pay | Admitting: Internal Medicine

## 2021-02-14 VITALS — BP 122/82 | HR 120 | Temp 97.3°F | Ht 62.5 in | Wt 137.0 lb

## 2021-02-14 DIAGNOSIS — J449 Chronic obstructive pulmonary disease, unspecified: Secondary | ICD-10-CM

## 2021-02-14 DIAGNOSIS — J9611 Chronic respiratory failure with hypoxia: Secondary | ICD-10-CM | POA: Diagnosis not present

## 2021-02-14 DIAGNOSIS — J441 Chronic obstructive pulmonary disease with (acute) exacerbation: Secondary | ICD-10-CM

## 2021-02-14 MED ORDER — PREDNISONE 10 MG (21) PO TBPK: ORAL_TABLET | Freq: Every day | ORAL | 0 refills | Status: DC

## 2021-02-14 MED ORDER — BREZTRI AEROSPHERE 160-9-4.8 MCG/ACT IN AERO: 2.0000 | INHALATION_SPRAY | Freq: Two times a day (BID) | RESPIRATORY_TRACT | 0 refills | Status: DC

## 2021-02-14 NOTE — Addendum Note (Signed)
Addended by: Coralie Keens on: 02/14/2021 01:34 PM   Modules accepted: Orders

## 2021-02-14 NOTE — Progress Notes (Signed)
OV 09/27/2015  Chief Complaint  Patient presents with  . Follow-up    Pt states her breathing is unchanged since last OV. Pt states she has intermittent chest tightness when SOB. Pt states her SOB is at baseline.  Pt denies cough.    #COPD - moderate - gold stage 2; borderline gold stg 3 #Smoking # RUL lung nodule - 10mm  June 2015     OV 11/28/2014  Chief Complaint  Patient presents with  . Follow-up    Pt c/o stable sob with exertion.  CAT score 17.  #COPD - moderate - gold stage 2; borderline gold stg 3  - Do to supposedly allergy from Spiriva she is now only on Symbicort. Overall COPD stable. COPD cat score is 17. Pulmonary rehabilitation initiation phase has helped her. She is going to start the maintenance phase. She is up-to-date with her vaccines. She thinks one more inhaler can help her. Not interested in research studies.   #Smoking: Continues to smoke refuses to quit   # RUL lung nodule - 72mm  June 2015 . Follow-up CT scan of the chest and January 2016 is stable.    02/26/2015 Follow up /O2 qualification  Patient returns for a two-month follow-up for COPD. She is maintained on Symbicort and INCRUSE.  She is here today for oxygen qualification. Patient does desaturate with walking to 88% on room air. She is on 3 L with activity and at bedtime.. She says overall her breathing is stable without flare of cough or shortness of breath. She denies any hemoptysis, chest pain, orthopnea, PND or leg swelling She has a known right upper lobe lung nodule 5 mm which was stable on follow-up CT chest. She has a follow-up CT chest in September 2016. She does continue to smoke, smoking cessation was discussed.  OV 09/27/2015  Chief Complaint  Patient presents with  . Follow-up    Pt states her breathing is unchanged since last OV. Pt states she has intermittent chest tightness when SOB. Pt states her SOB is at baseline.  Pt denies cough.    COPD: stable. No issues.  Doing well on triple MDI. NEeds flu shot   SMoking:  reports that she has been smoking Cigarettes.  She has a 30 pack-year smoking history. She has never used smokeless tobacco.   Lun gnodule  - personally visualizeed  IMPRESSION: 1. 5 mm right upper lobe nodule is unchanged from 05/15/2014 and is therefore likely benign. Additionally, 18-24 months of documented stability is recommended. Additional follow-up could be performed in 12 months to ensure stability, as clinically indicated. This recommendation follows the consensus statement: Guidelines for Management of Small Pulmonary Nodules Detected on CT Scans: A Statement from the [employer] as published in Radiology 2005; 237:395-400. Online at: https://www.arnold.com/. 2. Moderate to severe centrilobular emphysema. 3. Three-vessel coronary artery calcification. 4. Tiny bilateral renal stones.   Electronically Signed  By: Valerie Picket M.D.  On: 09/18/2015 12:31    OV 10/05/2016  Chief Complaint  Patient presents with  . Follow-up    Pt here for yearly f/u. Pt c/o increase in SOB x 2-3 days. Also c/o prod coughand chest tightness x 2-3 days. Pt denies f/c/s.    80 year old female with moderate COPD. Presents for yearly follow-up.  COPD: Overall stable but the last 2 days she's had increased cough, chest congestion, chest tightness, wheezing compared to baseline. In addition she is at increased sputum volume. She does not know the sputum color.  This no hemoptysis or fever or chills. Does denies any sick contacts. She's not had had a flu shot and was hoping to get a flu shot today. She's completed rehabilitation.  Right upper lobe lung nodule:  November 2017 CT chest shows it stable since 2015.  Smoking: She continues to smoke. Finds it difficult to quit.  Incidental findings on CT chest: She does have coronary artery calcification. She had last year too. Denies any chest pain.  She does not recollect any cardiac stress test. Review of the chart quickly does not show any cardiology evaluation. However she says she was subjected to some test where she cannot lie supine and therefore it was canceled. Do not know the details. She also has mild renal stones but she is aware of this. They're asymptomatic.    IMPRESSION:ctg chest 09/21/16   1. Right upper lobe nodule is unchanged from 05/15/2014 and is therefore considered benign. 2.  Emphysema (ICD10-J43.9). 3. Aortic atherosclerosis (ICD10-170.0). Coronary artery calcification. 4. Right adrenal adenoma. 5. Bilateral renal stones.   Electronically Signed   By: Valerie Picket M.D.   On: 09/21/2016 11:17   OV 06/09/2017  Chief Complaint  Patient presents with  . Follow-up    Pt c/o increase in SOB, prod cough with white mucus. Pt states she started on a pred taper yesterday. Pt c/o chest tightness. Pt denies f/c/s.    Follow-up moderate COPD and ongoing smoking  She called in yesterday prior to the scheduled appointment with symptoms of COPD exacerbation which is been going on for the last 2 days before days. Has increased cough, chest tightness, wheezing and shortness of breath but no change in sputum production. Symptoms are rated as moderate in intensity. She is compliant with her triple inhaler therapy for COPD. Unfortunately she continues to smoke. She thinks a rhinitis as well as caused exacerbation although the sinus drainage is clear. She started prednisone yesterday based on phone call and she's beginning to feel better. There is no colored sputum no edema and no hemoptysis no fever or chills      OV 08/17/2018  Subjective:  Patient ID: Valerie Gay, female , DOB: [birth year] , age 54 y.o. , MRN: [id number] , ADDRESS: [place] [id number]   08/17/2018 -   Chief Complaint  Patient presents with  . Follow-up    Pt states she has been having some more problems with SOB and a lot  of phlegm. States she occ coughs and when she does, she will bring up phlegm which she states is becoming yellow in color. States she has had some chest tightness.       HPI Valerie Gay 80 y.o. -  follow-up moderate COPD.,  Recent exacerbation and ongoing smoking  She continues to do well overall but in the last week has increased cough and congestion.  She feels that the onset of the fall season she might be getting a flareup.  She prefers to take antibiotic and prednisone.  She says she is compliant with Symbicort and Incruse.  She wants to defer the flu shot the next week.  She had to quit smoking.  Last CT scan of the chest was a year ago and she had a stable lung nodule for several years.  She is not eligible anymore for lung cancer screening because she is over 79 years of age.  COPD CAT score is documented below and is stable although the cough and dyspnea scores are slightly worse.  CAT COPD Symptom & Quality of Life Score (GSK trademark) 0 is no burden. 5 is highest burden 10/05/2016  08/17/2018   Never Cough -> Cough all the time 3 3  No phlegm in chest -> Chest is full of phlegm 3 4  No chest tightness -> Chest feels very tight 3 3  No dyspnea for 1 flight stairs/hill -> Very dyspneic for 1 flight of stairs 4 5  No limitations for ADL at home -> Very limited with ADL at home 3 1  Confident leaving home -> Not at all confident leaving home 3 3  Sleep soundly -> Do not sleep soundly because of lung condition 3 3  Lots of Energy -> No energy at all 3 3  TOTAL Score (max 40)  25 25     OV 02/14/2021  Subjective:  Patient ID: Valerie Gay, female , DOB: [birth year] , age 80 y.o. , MRN: [id number] , ADDRESS: [id number] [place] [address] PCP Kathyrn Lass, MD Patient Care Team: Kathyrn Lass, MD as PCP - General (Family Medicine) Leonie Man, MD as PCP - Cardiology (Cardiology)  This Provider for this visit: Treatment Team:  Attending Provider:  Brand Males, MD    02/14/2021 -   Chief Complaint  Patient presents with  . Follow-up    SOB is worse, chest tightness, weak     HPI [facility] 80 y.o. -  Last seen personally in October 2019.  After that she is followed up with nurse practitioner.  She remains on 3 L nasal cannula.  She tells me that 6 weeks ago she ran out of Incruse and insurance company would not approve it.  She remains on Symbicort but she is able to use.  Then in the last 3 weeks has had worsening shortness of breath with some increase in wheezing but no cough.  No sputum production.  COPD CAT score is 24 at baseline but nevertheless she feels worse than baseline.  She has baseline ankle edema for which she takes diuretics but this is not any different no fever no chills.  She has dry scaly skin.  She is sitting on wheelchair but she tells me that she is able to ambulate at home.  She has osteoporosis.    CAT Score 02/14/2021  Total CAT Score 24      PFT  PFT Results Latest Ref Rng & Units 05/07/2014  FVC-Pre L 2.00  FVC-Predicted Pre % 71  FVC-Post L 2.39  FVC-Predicted Post % 84  Pre FEV1/FVC % % 50  Post FEV1/FCV % % 47  FEV1-Pre L 0.99  FEV1-Predicted Pre % 47  FEV1-Post L 1.11  TLC L 5.12  TLC % Predicted % 101  RV % Predicted % 138       has a past medical history of Anemia (age 40), Cellulitis and abscess of foot (03/2017), Colon polyps, COPD (chronic obstructive pulmonary disease) ([place]), Diverticulosis, History of hiatal hernia, History of kidney stones, Hyperlipidemia, and On home oxygen therapy.   reports that she quit smoking about 2 years ago. Her smoking use included cigarettes. She has a 30.00 pack-year smoking history. She has never used smokeless tobacco.  Past Surgical History:  Procedure Laterality Date  . BALLOON DILATION N/A 03/08/2017   Procedure: BALLOON DILATION;  Surgeon: Manus Gunning, MD;  Location: [facility];  Service: Gastroenterology;   Laterality: N/A;  . CATARACT EXTRACTION Right    August 15, 2013  . CHOLECYSTECTOMY N/A 06/22/2014  Procedure: LAPAROSCOPIC CHOLECYSTECTOMY WITH INTRAOPERATIVE CHOLANGIOGRAM;  Surgeon: Gayland Curry, MD;  Location: [facility];  Service: General;  Laterality: N/A;  . COLONOSCOPY N/A 02/19/2017   Procedure: COLONOSCOPY;  Surgeon: Manus Gunning, MD;  Location: [facility];  Service: Gastroenterology;  Laterality: N/A;  . ESOPHAGOGASTRODUODENOSCOPY N/A 02/19/2017   Procedure: ESOPHAGOGASTRODUODENOSCOPY (EGD);  Surgeon: Manus Gunning, MD;  Location: [facility];  Service: Gastroenterology;  Laterality: N/A;  . ESOPHAGOGASTRODUODENOSCOPY N/A 03/08/2017   Procedure: ESOPHAGOGASTRODUODENOSCOPY (EGD);  Surgeon: Manus Gunning, MD;  Location: [facility];  Service: Gastroenterology;  Laterality: N/A;  . ESOPHAGOGASTRODUODENOSCOPY N/A 04/06/2017   Procedure: ESOPHAGOGASTRODUODENOSCOPY (EGD);  Surgeon: Manus Gunning, MD;  Location: [facility];  Service: Gastroenterology;  Laterality: N/A;  . ESOPHAGOGASTRODUODENOSCOPY (EGD) WITH PROPOFOL N/A 04/27/2017   Procedure: ESOPHAGOGASTRODUODENOSCOPY (EGD) WITH PROPOFOL;  Surgeon: Manus Gunning, MD;  Location: [facility];  Service: Gastroenterology;  Laterality: N/A;  . NM MYOVIEW LTD  10/2016    EF 55-65% with normal LV function. LOW RISK. No ischemia or infarction.  . TONSILLECTOMY    . TRANSTHORACIC ECHOCARDIOGRAM  06/2019   EF 60 to 65%.  No R WMA.  Normal RV function.  Mild calcification of aortic valve but no stenosis.  Mild mitral valve leaflet calcification but no stenosis or regurgitation.    Allergies  Allergen Reactions  . Spiriva Handihaler [Tiotropium Bromide Monohydrate] Hives, Itching and Rash     on upper body  . Banana Itching  . Other Other (See Comments)    Immunization History  Administered Date(s) Administered  . Fluad Quad(high Dose 65+) 06/19/2019  . H1N1 11/05/2008  . Influenza Whole  07/29/2014  . Influenza, High Dose Seasonal PF 10/18/2018, 12/20/2018, 07/27/2019  . Influenza,inj,Quad PF,6+ Mos 09/27/2015, 12/01/2016  . Moderna Sars-Covid-2 Vaccination 11/27/2019, 12/25/2019  . Pneumococcal Polysaccharide-23 03/28/2014  . Td 01/17/2020  . Zoster 04/17/2016    Family History  Problem Relation Age of Onset  . Alzheimer's disease Mother   . Diabetes Mother   . Cancer Father   . Stomach cancer Paternal Grandmother   . Colon cancer Neg Hx   . Esophageal cancer Neg Hx      Current Outpatient Medications:  .  acetaminophen (TYLENOL) 325 MG tablet, Take 2 tablets (650 mg total) by mouth every 6 (six) hours as needed for mild pain (or Fever >/= 101)., Disp:  , Rfl:  .  albuterol (PROVENTIL HFA;VENTOLIN HFA) 108 (90 Base) MCG/ACT inhaler, Inhale 2 puffs into the lungs every 6 (six) hours as needed for wheezing or shortness of breath., Disp: 3 Inhaler, Rfl: 3 .  albuterol (PROVENTIL) (2.5 MG/3ML) 0.083% nebulizer solution, Take 3 mLs (2.5 mg total) by nebulization every 8 (eight) hours as needed for wheezing., Disp: 270 mL, Rfl: 0 .  alendronate (FOSAMAX) 70 MG tablet, 1 tablet 30 minutes before the first food, beverage or medicine of the day with plain water, Disp: , Rfl:  .  Aloe-Sodium Chloride (AYR SALINE NASAL GEL NA), Place 1 application into the nose 6 (six) times daily., Disp: , Rfl:  .  budesonide-formoterol (SYMBICORT) 160-4.5 MCG/ACT inhaler, USE 2 PUFFS BY MOUTH TWO  TIMES DAILY, Disp: 30.6 g, Rfl: 0 .  doxycycline (VIBRA-TABS) 100 MG tablet, Take 1 tablet (100 mg total) by mouth every 12 (twelve) hours., Disp: 4 tablet, Rfl: 0 .  furosemide (LASIX) 40 MG tablet, TAKE 1 TABLET(40 MG) BY MOUTH DAILY.may take an additional 40 mg if need for increase swelling or shortness of breath, Disp: 100  tablet, Rfl: 3 .  hydroxypropyl methylcellulose / hypromellose (ISOPTO TEARS / GONIOVISC) 2.5 % ophthalmic solution, Place 2 drops into both eyes 3 (three) times daily as needed  for dry eyes., Disp: , Rfl:  .  magnesium citrate SOLN, Take by mouth daily as needed for severe constipation (constipation)., Disp: , Rfl:  .  OXYGEN, Inhale 3 L into the lungs continuous., Disp: , Rfl:  .  polyethylene glycol (MIRALAX / GLYCOLAX) 17 g packet, Take 17 g by mouth daily as needed for mild constipation., Disp: 14 each, Rfl: 0 .  sodium chloride (OCEAN) 0.65 % nasal spray, Place 1 spray into the nose 6 (six) times daily., Disp: , Rfl:  .  UNKNOWN TO PATIENT, Place into the right eye See admin instructions. Eye injection (name unknown to patient): Inject into the right eye every 7-8 weeks for wet macular degeneration, Disp: , Rfl:  .  umeclidinium bromide (INCRUSE ELLIPTA) 62.5 MCG/INH AEPB, Inhale 1 puff into the lungs every evening. (Patient not taking: Reported on 02/14/2021), Disp: 30 each, Rfl: 1      Objective:   Vitals:   02/14/21 1106  BP: 122/82  Pulse: (!) 120  Temp: (!) 97.3 F (36.3 C)  TempSrc: Oral  SpO2: 94%  Weight: 137 lb (62.1 kg)  Height: 5' 2.5" (1.588 m)    Estimated body mass index is 24.66 kg/m as calculated from the following:   Height as of this encounter: 5' 2.5" (1.588 m).   Weight as of this encounter: 137 lb (62.1 kg).  @WEIGHTCHANGE @  [employer]   02/14/21 1106  Weight: 137 lb (62.1 kg)     Physical Exam Frail elderly lady sitting on the wheelchair.  Looks somewhat deconditioned.  Has a dry flaky skin.  Barrel chested no wheezing no crackles.  Normal heart sounds abdomen soft chronic pedal edema present.  Alert and oriented x3 and pleasant.        Assessment:       ICD-10-CM   1. COPD with acute exacerbation ([place])  J44.1   2. Chronic respiratory failure with hypoxia ([facility])  J96.11   3. COPD Gold II D, still smoking  J44.9        Plan:     Patient Instructions     ICD-10-CM   1. COPD with acute exacerbation ([place])  J44.1   2. Chronic respiratory failure with hypoxia ([facility])  J96.11   3. COPD Gold II D, still smoking   J44.9    You are in COPD exacerbation because you ran out of Incruse because of insurance reasons You are currently just on Symbicort without the benefit of anticholinergic You are also on baseline 3 L oxygen  Plan   - Please take prednisone 40 mg x1 day, then 30 mg x1 day, then 20 mg x1 day, then 10 mg x1 day, and then 5 mg x1 day and stop -We will consolidate your inhaler regimen as follows  - start BREZTRI 2 puff twice daily - take 8 week sample  - stop symbicort  - no need to restart incruse  - use albuterol as needed - continue o2 - 3L [place]  Followup  - 3 months or sooner if neded      SIGNATURE    Dr. Brand Males, M.D., F.C.C.P,  Pulmonary and Critical Care Medicine Staff Physician, [place] Director - Interstitial Lung Disease  Program  Pulmonary [facility] [place], [place], [address]  Pager:  [phone], If no answer or between  15:00h - 7:00h: call [phone] Telephone: [phone]  11:39 AM 02/14/2021

## 2021-02-14 NOTE — Addendum Note (Signed)
Addended by: Coralie Keens on: 02/14/2021 01:33 PM   Modules accepted: Orders

## 2021-02-14 NOTE — Patient Instructions (Addendum)
ICD-10-CM   1. COPD with acute exacerbation ([place])  J44.1   2. Chronic respiratory failure with hypoxia ([facility])  J96.11   3. COPD Gold II D, still smoking  J44.9    You are in COPD exacerbation because you ran out of Incruse because of insurance reasons You are currently just on Symbicort without the benefit of anticholinergic You are also on baseline 3 L oxygen  Plan   - Please take prednisone 40 mg x1 day, then 30 mg x1 day, then 20 mg x1 day, then 10 mg x1 day, and then 5 mg x1 day and stop -We will consolidate your inhaler regimen as follows  - start BREZTRI 2 puff twice daily - take 8 week sample  - stop symbicort  - no need to restart incruse  - use albuterol as needed - continue o2 - 3L [place]  Followup  - 3 months or sooner if neded

## 2021-02-14 NOTE — Addendum Note (Signed)
Addended by: Coralie Keens on: 02/14/2021 11:47 AM   Modules accepted: Orders

## 2021-03-04 ENCOUNTER — Other Ambulatory Visit: Payer: Self-pay

## 2021-03-04 MED ORDER — FUROSEMIDE 40 MG PO TABS: ORAL_TABLET | ORAL | 2 refills | Status: AC

## 2021-03-18 ENCOUNTER — Other Ambulatory Visit: Payer: Self-pay | Admitting: Internal Medicine

## 2021-03-19 DIAGNOSIS — H5315 Visual distortions of shape and size: Secondary | ICD-10-CM | POA: Diagnosis not present

## 2021-03-19 DIAGNOSIS — H2522 Age-related cataract, morgagnian type, left eye: Secondary | ICD-10-CM | POA: Diagnosis not present

## 2021-03-19 DIAGNOSIS — H35361 Drusen (degenerative) of macula, right eye: Secondary | ICD-10-CM | POA: Diagnosis not present

## 2021-03-19 DIAGNOSIS — H353211 Exudative age-related macular degeneration, right eye, with active choroidal neovascularization: Secondary | ICD-10-CM | POA: Diagnosis not present

## 2021-03-19 DIAGNOSIS — H35451 Secondary pigmentary degeneration, right eye: Secondary | ICD-10-CM | POA: Diagnosis not present

## 2021-03-19 DIAGNOSIS — H53002 Unspecified amblyopia, left eye: Secondary | ICD-10-CM | POA: Diagnosis not present

## 2021-03-19 DIAGNOSIS — H1789 Other corneal scars and opacities: Secondary | ICD-10-CM | POA: Diagnosis not present

## 2021-04-01 ENCOUNTER — Telehealth: Payer: Self-pay | Admitting: Internal Medicine

## 2021-04-01 NOTE — Telephone Encounter (Signed)
Called and spoke with pt who states she spoke with [employer] and they told her that it is time for her to recertify for her O2. Stated to pt since her last visit was an acute visit that we would need to get her in for a follow up and do walk test to get her recertified for her O2 and she verbalized understanding. Pt said she has been taking a med that MR had wanted her to take and then to f/u with him after that so I stated to pt to call office once she finished that med to get appt scheduled with MR and she verbalized understanding. Nothing further needed.

## 2021-04-15 ENCOUNTER — Telehealth: Payer: Self-pay | Admitting: Internal Medicine

## 2021-04-15 MED ORDER — BREZTRI AEROSPHERE 160-9-4.8 MCG/ACT IN AERO: 2.0000 | INHALATION_SPRAY | Freq: Two times a day (BID) | RESPIRATORY_TRACT | 3 refills | Status: DC

## 2021-04-15 NOTE — Telephone Encounter (Signed)
Spoke with pt and advised that Breztri refill has been sent to pharmacy. Pt verbalized understanding.  Nothing further needed.

## 2021-04-18 DIAGNOSIS — H353211 Exudative age-related macular degeneration, right eye, with active choroidal neovascularization: Secondary | ICD-10-CM | POA: Diagnosis not present

## 2021-04-29 DIAGNOSIS — J449 Chronic obstructive pulmonary disease, unspecified: Secondary | ICD-10-CM | POA: Diagnosis not present

## 2021-05-01 ENCOUNTER — Other Ambulatory Visit: Payer: Self-pay | Admitting: Internal Medicine

## 2021-05-07 ENCOUNTER — Telehealth: Payer: Self-pay | Admitting: Internal Medicine

## 2021-05-07 MED ORDER — BREZTRI AEROSPHERE 160-9-4.8 MCG/ACT IN AERO: 2.0000 | INHALATION_SPRAY | Freq: Two times a day (BID) | RESPIRATORY_TRACT | 3 refills | Status: DC

## 2021-05-07 MED ORDER — ALBUTEROL SULFATE (2.5 MG/3ML) 0.083% IN NEBU: 2.5000 mg | INHALATION_SOLUTION | Freq: Three times a day (TID) | RESPIRATORY_TRACT | 11 refills | Status: AC | PRN

## 2021-05-07 NOTE — Telephone Encounter (Signed)
Called and spoke with pt letting her know that I did not know why Rx were denied but stated to her that I had sent meds to [facility] for her.  Pt said that she would rather have Breztri sent to mail order pharmacy. Stated to her that I would send Judithann Sauger to her mail order pharmacy for her and she verbalized understanding.  Rx for Albuterol sol has been sent to [facility] has been sent to mail order pharmacy. I called [facility] to cancel the Rx that had been sent electronically for [facility]. Nothing further needed.

## 2021-05-07 NOTE — Telephone Encounter (Signed)
Pt stated that she was informed by her pharmacy that her medication; albuterol for her nebulizer and Judithann Sauger was denied by Dr. Chase Caller and she is wanting to know as to why. Pls regard; [phone]. Pharmacy; [place], [place] - [address] AT [place]

## 2021-05-12 ENCOUNTER — Telehealth: Payer: Self-pay | Admitting: Internal Medicine

## 2021-05-12 NOTE — Telephone Encounter (Signed)
Spoke with pt who states she wants a POC. I informed pt she would need to come into office to do qulifing walk. Pt stated understanding. First available appt was with Geraldo Pitter 06/02/21. Pt stated understanding. Nothing further needed at this time.

## 2021-05-21 DIAGNOSIS — H353211 Exudative age-related macular degeneration, right eye, with active choroidal neovascularization: Secondary | ICD-10-CM | POA: Diagnosis not present

## 2021-06-01 NOTE — Progress Notes (Deleted)
@Patient  ID: Valerie Gay, female    DOB: 11-12-1941, 80 y.o.   MRN: [id number]  No chief complaint on file.   Referring provider: Kathyrn Lass, MD  HPI: 80 year old female, current smoker (30 pack year hx). PMH significant for COPD GOLD II, chronic respiratory failure, pulmonary nodule, CAD, GERD, esophageal stricture, dysphagia. Patient of Dr. Chase Caller.  Maintained on Symbicort 160 and Incruse.   Previous [facility] pulmonary encounter: 10/19/2019 Patient contacted today for acute televisit, reports increased shortness of breath x 2 days with associated chest tightness. Compliant with Symbicort and Incruse as prescribed, she has been using her albuterol nebulizer 4 times a day with no real benefit. She does not take her diuretic regularly. She has noticed increased leg swelling. Reports that she has gained weight because she was told to. Denies fever, chills, sweats, change in smell/taste, cough or wheezing.   10/24/2019 Patient contacted today for follow-up televisit for shortness of breath and leg swelling. She is feeling better, no acute complaints. She has been taking her lasix as prescribed and thinks that is what has help her breathing. She continues Symbicort/incruse as prescribed. She is only using her albuterol nebulizer once in the morning. Maintained on 3L oxygen, no additional O2 requirements. Denies active shortness of breath, wheezing or chest tightness. No further needs.    02/14/21- Dr. Domenic Schwab [facility] 80 y.o. -  Last seen personally in October 2019.  After that she is followed up with nurse practitioner.  She remains on 3 L nasal cannula.  She tells me that 6 weeks ago she ran out of Incruse and insurance company would not approve it.  She remains on Symbicort but she is able to use.  Then in the last 3 weeks has had worsening shortness of breath with some increase in wheezing but no cough.  No sputum production.  COPD CAT score is 24 at baseline but  nevertheless she feels worse than baseline.  She has baseline ankle edema for which she takes diuretics but this is not any different no fever no chills.  She has dry scaly skin.  She is sitting on wheelchair but she tells me that she is able to ambulate at home.  She has osteoporosis.  06/02/2021- Interim hx  Patient presents today for 3 month follow-up/needs qualifying walk.         Allergies  Allergen Reactions   Spiriva Handihaler [Tiotropium Bromide Monohydrate] Hives, Itching and Rash     on upper body   Banana Itching   Other Other (See Comments)    Immunization History  Administered Date(s) Administered   Fluad Quad(high Dose 65+) 06/19/2019   H1N1 11/05/2008   Influenza Whole 07/29/2014   Influenza, High Dose Seasonal PF 10/18/2018, 12/20/2018, 07/27/2019   Influenza,inj,Quad PF,6+ Mos 09/27/2015, 12/01/2016   Moderna Sars-Covid-2 Vaccination 11/27/2019, 12/25/2019   Pneumococcal Polysaccharide-23 03/28/2014   Td 01/17/2020   Zoster, Live 04/17/2016    Past Medical History:  Diagnosis Date   Anemia age 33   Cellulitis and abscess of foot 03/2017   rt foot   Colon polyps    2010    COPD (chronic obstructive pulmonary disease) ([place])    Diverticulosis    History of hiatal hernia    History of kidney stones    Hyperlipidemia    On home oxygen therapy     Tobacco History: Social History   Tobacco Use  Smoking Status Former   Packs/day: 0.50   Years: 60.00  Pack years: 30.00   Types: Cigarettes   Quit date: 02/06/2019   Years since quitting: 2.3  Smokeless Tobacco Never  Tobacco Comments   01/17/19 down to 1-2 cigs per day   Counseling given: Not Answered Tobacco comments: 01/17/19 down to 1-2 cigs per day   Outpatient Medications Prior to Visit  Medication Sig Dispense Refill   acetaminophen (TYLENOL) 325 MG tablet Take 2 tablets (650 mg total) by mouth every 6 (six) hours as needed for mild pain (or Fever >/= 101).     albuterol (PROVENTIL  HFA;VENTOLIN HFA) 108 (90 Base) MCG/ACT inhaler Inhale 2 puffs into the lungs every 6 (six) hours as needed for wheezing or shortness of breath. 3 Inhaler 3   albuterol (PROVENTIL) (2.5 MG/3ML) 0.083% nebulizer solution Take 3 mLs (2.5 mg total) by nebulization every 8 (eight) hours as needed for wheezing. 270 mL 11   alendronate (FOSAMAX) 70 MG tablet 1 tablet 30 minutes before the first food, beverage or medicine of the day with plain water     Aloe-Sodium Chloride (AYR SALINE NASAL GEL NA) Place 1 application into the nose 6 (six) times daily.     Budeson-Glycopyrrol-Formoterol (BREZTRI AEROSPHERE) 160-9-4.8 MCG/ACT AERO Inhale 2 puffs into the lungs in the morning and at bedtime. 10.7 g 3   doxycycline (VIBRA-TABS) 100 MG tablet Take 1 tablet (100 mg total) by mouth every 12 (twelve) hours. 4 tablet 0   furosemide (LASIX) 40 MG tablet TAKE 1 TABLET(40 MG) BY MOUTH DAILY.may take an additional 40 mg if need for increase swelling or shortness of breath 90 tablet 2   hydroxypropyl methylcellulose / hypromellose (ISOPTO TEARS / GONIOVISC) 2.5 % ophthalmic solution Place 2 drops into both eyes 3 (three) times daily as needed for dry eyes.     magnesium citrate SOLN Take by mouth daily as needed for severe constipation (constipation).     OXYGEN Inhale 3 L into the lungs continuous.     polyethylene glycol (MIRALAX / GLYCOLAX) 17 g packet Take 17 g by mouth daily as needed for mild constipation. 14 each 0   predniSONE (STERAPRED UNI-PAK 21 TAB) 10 MG (21) TBPK tablet Take by mouth daily. Take 40 mg x1 day, then 30 mg x1 day, then 20 mg x1 day, then 10 mg x1 day, and then 5 mg x1 day and stop 12 tablet 0   sodium chloride (OCEAN) 0.65 % nasal spray Place 1 spray into the nose 6 (six) times daily.     UNKNOWN TO PATIENT Place into the right eye See admin instructions. Eye injection (name unknown to patient): Inject into the right eye every 7-8 weeks for wet macular degeneration     No  facility-administered medications prior to visit.      Review of Systems  Review of Systems   Physical Exam  There were no vitals taken for this visit. Physical Exam   Lab Results:  CBC    Component Value Date/Time   WBC 10.4 01/26/2020 0617   RBC 3.31 (L) 01/26/2020 0617   HGB 10.0 (L) 01/26/2020 0617   HCT 31.6 (L) 01/26/2020 0617   PLT 466 (H) 01/26/2020 0617   MCV 95.5 01/26/2020 0617   MCH 30.2 01/26/2020 0617   MCHC 31.6 01/26/2020 0617   RDW 13.5 01/26/2020 0617   LYMPHSABS 1.6 01/26/2020 0617   MONOABS 1.1 (H) 01/26/2020 0617   EOSABS 0.3 01/26/2020 0617   BASOSABS 0.0 01/26/2020 0617    BMET    Component Value  Date/Time   NA 142 10/24/2020 1512   K 4.3 10/24/2020 1512   CL 99 10/24/2020 1512   CO2 28 10/24/2020 1512   GLUCOSE 96 10/24/2020 1512   GLUCOSE 94 01/26/2020 0617   BUN 19 10/24/2020 1512   CREATININE 0.92 10/24/2020 1512   CALCIUM 9.6 10/24/2020 1512   GFRNONAA 59 (L) 10/24/2020 1512   GFRAA 68 10/24/2020 1512    BNP No results found for: BNP  ProBNP    Component Value Date/Time   PROBNP 167 06/23/2019 1216   PROBNP 50.0 09/26/2008 1205    Imaging: No results found.   Assessment & Plan:   No problem-specific Assessment & Plan notes found for this encounter.     Martyn Ehrich, NP 06/01/2021

## 2021-06-02 ENCOUNTER — Ambulatory Visit: Payer: Medicare Other | Admitting: Primary Care

## 2021-06-25 DIAGNOSIS — H353211 Exudative age-related macular degeneration, right eye, with active choroidal neovascularization: Secondary | ICD-10-CM | POA: Diagnosis not present

## 2021-07-01 ENCOUNTER — Other Ambulatory Visit: Payer: Self-pay

## 2021-07-01 ENCOUNTER — Encounter (HOSPITAL_COMMUNITY): Payer: Self-pay

## 2021-07-01 ENCOUNTER — Inpatient Hospital Stay (HOSPITAL_COMMUNITY)
Admission: EM | Admit: 2021-07-01 | Discharge: 2021-07-09 | DRG: 603 | Disposition: A | Discharge status: Skilled Nursing Facility | Payer: Medicare Other | Attending: Internal Medicine | Admitting: Internal Medicine

## 2021-07-01 DIAGNOSIS — R000 Tachycardia, unspecified: Secondary | ICD-10-CM | POA: Diagnosis present

## 2021-07-01 DIAGNOSIS — J439 Emphysema, unspecified: Secondary | ICD-10-CM | POA: Diagnosis not present

## 2021-07-01 DIAGNOSIS — R0602 Shortness of breath: Secondary | ICD-10-CM | POA: Diagnosis not present

## 2021-07-01 DIAGNOSIS — Z720 Tobacco use: Secondary | ICD-10-CM | POA: Diagnosis present

## 2021-07-01 DIAGNOSIS — Z20822 Contact with and (suspected) exposure to covid-19: Secondary | ICD-10-CM | POA: Diagnosis present

## 2021-07-01 DIAGNOSIS — J9611 Chronic respiratory failure with hypoxia: Secondary | ICD-10-CM | POA: Diagnosis not present

## 2021-07-01 DIAGNOSIS — Z833 Family history of diabetes mellitus: Secondary | ICD-10-CM

## 2021-07-01 DIAGNOSIS — D649 Anemia, unspecified: Secondary | ICD-10-CM | POA: Diagnosis present

## 2021-07-01 DIAGNOSIS — M79673 Pain in unspecified foot: Secondary | ICD-10-CM | POA: Diagnosis not present

## 2021-07-01 DIAGNOSIS — E876 Hypokalemia: Secondary | ICD-10-CM | POA: Diagnosis present

## 2021-07-01 DIAGNOSIS — D75839 Thrombocytosis, unspecified: Secondary | ICD-10-CM | POA: Diagnosis present

## 2021-07-01 DIAGNOSIS — H04123 Dry eye syndrome of bilateral lacrimal glands: Secondary | ICD-10-CM | POA: Diagnosis present

## 2021-07-01 DIAGNOSIS — J441 Chronic obstructive pulmonary disease with (acute) exacerbation: Secondary | ICD-10-CM | POA: Diagnosis not present

## 2021-07-01 DIAGNOSIS — Z8719 Personal history of other diseases of the digestive system: Secondary | ICD-10-CM

## 2021-07-01 DIAGNOSIS — Z9981 Dependence on supplemental oxygen: Secondary | ICD-10-CM

## 2021-07-01 DIAGNOSIS — Z9109 Other allergy status, other than to drugs and biological substances: Secondary | ICD-10-CM

## 2021-07-01 DIAGNOSIS — Z888 Allergy status to other drugs, medicaments and biological substances status: Secondary | ICD-10-CM

## 2021-07-01 DIAGNOSIS — Z800 Family history of malignant neoplasm of digestive organs: Secondary | ICD-10-CM

## 2021-07-01 DIAGNOSIS — M7989 Other specified soft tissue disorders: Secondary | ICD-10-CM

## 2021-07-01 DIAGNOSIS — Z820 Family history of epilepsy and other diseases of the nervous system: Secondary | ICD-10-CM

## 2021-07-01 DIAGNOSIS — L03115 Cellulitis of right lower limb: Secondary | ICD-10-CM | POA: Diagnosis not present

## 2021-07-01 DIAGNOSIS — J449 Chronic obstructive pulmonary disease, unspecified: Secondary | ICD-10-CM | POA: Diagnosis present

## 2021-07-01 DIAGNOSIS — E785 Hyperlipidemia, unspecified: Secondary | ICD-10-CM | POA: Diagnosis present

## 2021-07-01 DIAGNOSIS — F17210 Nicotine dependence, cigarettes, uncomplicated: Secondary | ICD-10-CM | POA: Diagnosis present

## 2021-07-01 LAB — RESP PANEL BY RT-PCR (FLU A&B, COVID) ARPGX2
Influenza A by PCR: NEGATIVE
Influenza B by PCR: NEGATIVE
SARS Coronavirus 2 by RT PCR: NEGATIVE

## 2021-07-01 LAB — CBC WITH DIFFERENTIAL/PLATELET
Abs Immature Granulocytes: 0.64 10*3/uL — ABNORMAL HIGH (ref 0.00–0.07)
Basophils Absolute: 0.1 10*3/uL (ref 0.0–0.1)
Basophils Relative: 0 %
Eosinophils Absolute: 0 10*3/uL (ref 0.0–0.5)
Eosinophils Relative: 0 %
HCT: 33.4 % — ABNORMAL LOW (ref 36.0–46.0)
Hemoglobin: 10.8 g/dL — ABNORMAL LOW (ref 12.0–15.0)
Immature Granulocytes: 2 %
Lymphocytes Relative: 4 %
Lymphs Abs: 1.4 10*3/uL (ref 0.7–4.0)
MCH: 32 pg (ref 26.0–34.0)
MCHC: 32.3 g/dL (ref 30.0–36.0)
MCV: 99.1 fL (ref 80.0–100.0)
Monocytes Absolute: 1.3 10*3/uL — ABNORMAL HIGH (ref 0.1–1.0)
Monocytes Relative: 3 %
Neutro Abs: 36.7 10*3/uL — ABNORMAL HIGH (ref 1.7–7.7)
Neutrophils Relative %: 91 %
Platelets: 513 10*3/uL — ABNORMAL HIGH (ref 150–400)
RBC: 3.37 MIL/uL — ABNORMAL LOW (ref 3.87–5.11)
RDW: 14.8 % (ref 11.5–15.5)
WBC: 40.1 10*3/uL — ABNORMAL HIGH (ref 4.0–10.5)
nRBC: 0 % (ref 0.0–0.2)

## 2021-07-01 LAB — COMPREHENSIVE METABOLIC PANEL
ALT: 14 U/L (ref 0–44)
AST: 18 U/L (ref 15–41)
Albumin: 2.8 g/dL — ABNORMAL LOW (ref 3.5–5.0)
Alkaline Phosphatase: 87 U/L (ref 38–126)
Anion gap: 11 (ref 5–15)
BUN: 21 mg/dL (ref 8–23)
CO2: 32 mmol/L (ref 22–32)
Calcium: 9 mg/dL (ref 8.9–10.3)
Chloride: 95 mmol/L — ABNORMAL LOW (ref 98–111)
Creatinine, Ser: 1.03 mg/dL — ABNORMAL HIGH (ref 0.44–1.00)
GFR, Estimated: 54 mL/min — ABNORMAL LOW (ref >60)
Glucose, Bld: 105 mg/dL — ABNORMAL HIGH (ref 70–99)
Potassium: 2.9 mmol/L — ABNORMAL LOW (ref 3.5–5.1)
Sodium: 138 mmol/L (ref 135–145)
Total Bilirubin: 1.1 mg/dL (ref 0.3–1.2)
Total Protein: 6.4 g/dL — ABNORMAL LOW (ref 6.5–8.1)

## 2021-07-01 MED ORDER — SODIUM CHLORIDE 0.9 % IV SOLN
2.0000 g | Freq: Once | INTRAVENOUS | Status: DC
  Administered 2021-07-02: 2 g via INTRAVENOUS
  Filled 2021-07-01: qty 20

## 2021-07-01 MED ORDER — IPRATROPIUM-ALBUTEROL 0.5-2.5 (3) MG/3ML IN SOLN
3.0000 mL | Freq: Once | RESPIRATORY_TRACT | Status: AC
  Administered 2021-07-02: 3 mL via RESPIRATORY_TRACT
  Filled 2021-07-01: qty 3

## 2021-07-01 NOTE — ED Triage Notes (Signed)
Brought in by [facility] from home - c/o right foot pain (especially during movement) and swelling x 3 days. Denies any recent trauma.    Pt normally on O2 at 3lpm via [place] (COPD).

## 2021-07-01 NOTE — ED Provider Notes (Signed)
Emergency Medicine Provider Triage Evaluation Note  [name] , a 80 y.o. female  was evaluated in triage.  Pt complains of right foot infection   Review of Systems  Positive: Pain and redness Negative: No fever   Physical Exam  BP 123/73 (BP Location: Left Arm)   Pulse (!) 136   Temp 97.6 F (36.4 C) (Oral)   Resp 18   Ht '5\' 2"'$  (1.575 m)   Wt 62 kg   SpO2 100%   BMI 25.00 kg/m  Gen:   Awake, no distress   Resp:  Normal effort  MSK:   Moves extremities without difficulty  Other:  Right foot red and swollen   Medical Decision Making  Medically screening exam initiated at 7:33 PM.  Appropriate orders placed.  Valerie Gay was informed that the remainder of the evaluation will be completed by another provider, this initial triage assessment does not replace that evaluation, and the importance of remaining in the ED until their evaluation is complete.     Fransico Meadow, [place] 07/01/21 1934    Jeanell Sparrow, DO 07/02/21 0104

## 2021-07-01 NOTE — ED Notes (Signed)
Oxygen tank replaced °

## 2021-07-02 ENCOUNTER — Emergency Department (HOSPITAL_COMMUNITY): Payer: Medicare Other

## 2021-07-02 ENCOUNTER — Encounter (HOSPITAL_COMMUNITY): Payer: Self-pay | Admitting: Internal Medicine

## 2021-07-02 DIAGNOSIS — D649 Anemia, unspecified: Secondary | ICD-10-CM | POA: Diagnosis not present

## 2021-07-02 DIAGNOSIS — Z8719 Personal history of other diseases of the digestive system: Secondary | ICD-10-CM | POA: Diagnosis not present

## 2021-07-02 DIAGNOSIS — Z7401 Bed confinement status: Secondary | ICD-10-CM | POA: Diagnosis not present

## 2021-07-02 DIAGNOSIS — E876 Hypokalemia: Secondary | ICD-10-CM | POA: Diagnosis not present

## 2021-07-02 DIAGNOSIS — F17210 Nicotine dependence, cigarettes, uncomplicated: Secondary | ICD-10-CM | POA: Diagnosis present

## 2021-07-02 DIAGNOSIS — Z20822 Contact with and (suspected) exposure to covid-19: Secondary | ICD-10-CM | POA: Diagnosis not present

## 2021-07-02 DIAGNOSIS — Z800 Family history of malignant neoplasm of digestive organs: Secondary | ICD-10-CM | POA: Diagnosis not present

## 2021-07-02 DIAGNOSIS — D75839 Thrombocytosis, unspecified: Secondary | ICD-10-CM | POA: Diagnosis not present

## 2021-07-02 DIAGNOSIS — R5381 Other malaise: Secondary | ICD-10-CM | POA: Diagnosis not present

## 2021-07-02 DIAGNOSIS — M7989 Other specified soft tissue disorders: Secondary | ICD-10-CM | POA: Diagnosis not present

## 2021-07-02 DIAGNOSIS — Z833 Family history of diabetes mellitus: Secondary | ICD-10-CM | POA: Diagnosis not present

## 2021-07-02 DIAGNOSIS — R2681 Unsteadiness on feet: Secondary | ICD-10-CM | POA: Diagnosis not present

## 2021-07-02 DIAGNOSIS — H04123 Dry eye syndrome of bilateral lacrimal glands: Secondary | ICD-10-CM | POA: Diagnosis present

## 2021-07-02 DIAGNOSIS — R000 Tachycardia, unspecified: Secondary | ICD-10-CM | POA: Diagnosis present

## 2021-07-02 DIAGNOSIS — R419 Unspecified symptoms and signs involving cognitive functions and awareness: Secondary | ICD-10-CM | POA: Diagnosis not present

## 2021-07-02 DIAGNOSIS — I2510 Atherosclerotic heart disease of native coronary artery without angina pectoris: Secondary | ICD-10-CM | POA: Diagnosis not present

## 2021-07-02 DIAGNOSIS — R601 Generalized edema: Secondary | ICD-10-CM | POA: Diagnosis not present

## 2021-07-02 DIAGNOSIS — L03115 Cellulitis of right lower limb: Secondary | ICD-10-CM | POA: Diagnosis not present

## 2021-07-02 DIAGNOSIS — J449 Chronic obstructive pulmonary disease, unspecified: Secondary | ICD-10-CM | POA: Diagnosis not present

## 2021-07-02 DIAGNOSIS — R0602 Shortness of breath: Secondary | ICD-10-CM | POA: Diagnosis not present

## 2021-07-02 DIAGNOSIS — M6281 Muscle weakness (generalized): Secondary | ICD-10-CM | POA: Diagnosis not present

## 2021-07-02 DIAGNOSIS — E785 Hyperlipidemia, unspecified: Secondary | ICD-10-CM | POA: Diagnosis not present

## 2021-07-02 DIAGNOSIS — J439 Emphysema, unspecified: Secondary | ICD-10-CM | POA: Diagnosis not present

## 2021-07-02 DIAGNOSIS — L0390 Cellulitis, unspecified: Secondary | ICD-10-CM | POA: Diagnosis not present

## 2021-07-02 DIAGNOSIS — Z9981 Dependence on supplemental oxygen: Secondary | ICD-10-CM | POA: Diagnosis not present

## 2021-07-02 DIAGNOSIS — R600 Localized edema: Secondary | ICD-10-CM | POA: Diagnosis not present

## 2021-07-02 DIAGNOSIS — Z9109 Other allergy status, other than to drugs and biological substances: Secondary | ICD-10-CM | POA: Diagnosis not present

## 2021-07-02 DIAGNOSIS — K219 Gastro-esophageal reflux disease without esophagitis: Secondary | ICD-10-CM | POA: Diagnosis not present

## 2021-07-02 DIAGNOSIS — R0902 Hypoxemia: Secondary | ICD-10-CM | POA: Diagnosis not present

## 2021-07-02 DIAGNOSIS — R262 Difficulty in walking, not elsewhere classified: Secondary | ICD-10-CM | POA: Diagnosis not present

## 2021-07-02 DIAGNOSIS — Z820 Family history of epilepsy and other diseases of the nervous system: Secondary | ICD-10-CM | POA: Diagnosis not present

## 2021-07-02 DIAGNOSIS — Z888 Allergy status to other drugs, medicaments and biological substances status: Secondary | ICD-10-CM | POA: Diagnosis not present

## 2021-07-02 DIAGNOSIS — Z741 Need for assistance with personal care: Secondary | ICD-10-CM | POA: Diagnosis not present

## 2021-07-02 DIAGNOSIS — J9611 Chronic respiratory failure with hypoxia: Secondary | ICD-10-CM

## 2021-07-02 LAB — CBC WITH DIFFERENTIAL/PLATELET
Abs Immature Granulocytes: 0.48 10*3/uL — ABNORMAL HIGH (ref 0.00–0.07)
Basophils Absolute: 0.1 10*3/uL (ref 0.0–0.1)
Basophils Relative: 0 %
Eosinophils Absolute: 0.1 10*3/uL (ref 0.0–0.5)
Eosinophils Relative: 0 %
HCT: 29.7 % — ABNORMAL LOW (ref 36.0–46.0)
Hemoglobin: 9.4 g/dL — ABNORMAL LOW (ref 12.0–15.0)
Immature Granulocytes: 1 %
Lymphocytes Relative: 5 %
Lymphs Abs: 1.7 10*3/uL (ref 0.7–4.0)
MCH: 32.2 pg (ref 26.0–34.0)
MCHC: 31.6 g/dL (ref 30.0–36.0)
MCV: 101.7 fL — ABNORMAL HIGH (ref 80.0–100.0)
Monocytes Absolute: 1.2 10*3/uL — ABNORMAL HIGH (ref 0.1–1.0)
Monocytes Relative: 4 %
Neutro Abs: 31 10*3/uL — ABNORMAL HIGH (ref 1.7–7.7)
Neutrophils Relative %: 90 %
Platelets: 417 10*3/uL — ABNORMAL HIGH (ref 150–400)
RBC: 2.92 MIL/uL — ABNORMAL LOW (ref 3.87–5.11)
RDW: 14.6 % (ref 11.5–15.5)
WBC: 34.6 10*3/uL — ABNORMAL HIGH (ref 4.0–10.5)
nRBC: 0 % (ref 0.0–0.2)

## 2021-07-02 LAB — PROCALCITONIN: Procalcitonin: 0.89 ng/mL

## 2021-07-02 LAB — COMPREHENSIVE METABOLIC PANEL
ALT: 11 U/L (ref 0–44)
AST: 16 U/L (ref 15–41)
Albumin: 2.3 g/dL — ABNORMAL LOW (ref 3.5–5.0)
Alkaline Phosphatase: 91 U/L (ref 38–126)
Anion gap: 10 (ref 5–15)
BUN: 25 mg/dL — ABNORMAL HIGH (ref 8–23)
CO2: 29 mmol/L (ref 22–32)
Calcium: 8.6 mg/dL — ABNORMAL LOW (ref 8.9–10.3)
Chloride: 99 mmol/L (ref 98–111)
Creatinine, Ser: 1.09 mg/dL — ABNORMAL HIGH (ref 0.44–1.00)
GFR, Estimated: 51 mL/min — ABNORMAL LOW (ref >60)
Glucose, Bld: 92 mg/dL (ref 70–99)
Potassium: 3.7 mmol/L (ref 3.5–5.1)
Sodium: 138 mmol/L (ref 135–145)
Total Bilirubin: 0.9 mg/dL (ref 0.3–1.2)
Total Protein: 5.4 g/dL — ABNORMAL LOW (ref 6.5–8.1)

## 2021-07-02 LAB — MAGNESIUM
Magnesium: 2.1 mg/dL (ref 1.7–2.4)
Magnesium: 2 mg/dL (ref 1.7–2.4)

## 2021-07-02 LAB — LACTIC ACID, PLASMA: Lactic Acid, Venous: 1.1 mmol/L (ref 0.5–1.9)

## 2021-07-02 LAB — C-REACTIVE PROTEIN: CRP: 32.8 mg/dL — ABNORMAL HIGH (ref <1.0)

## 2021-07-02 MED ORDER — POLYVINYL ALCOHOL 1.4 % OP SOLN
2.0000 [drp] | Freq: Three times a day (TID) | OPHTHALMIC | Status: DC | PRN
  Filled 2021-07-02: qty 15

## 2021-07-02 MED ORDER — BUDESON-GLYCOPYRROL-FORMOTEROL 160-9-4.8 MCG/ACT IN AERO: 2.0000 | INHALATION_SPRAY | Freq: Two times a day (BID) | RESPIRATORY_TRACT | Status: DC

## 2021-07-02 MED ORDER — UMECLIDINIUM BROMIDE 62.5 MCG/INH IN AEPB
1.0000 | INHALATION_SPRAY | Freq: Every day | RESPIRATORY_TRACT | Status: DC
  Filled 2021-07-02: qty 7

## 2021-07-02 MED ORDER — UMECLIDINIUM BROMIDE 62.5 MCG/INH IN AEPB
1.0000 | INHALATION_SPRAY | Freq: Every day | RESPIRATORY_TRACT | Status: DC
  Administered 2021-07-02 – 2021-07-09 (×8): 1 via RESPIRATORY_TRACT
  Filled 2021-07-02: qty 7

## 2021-07-02 MED ORDER — MAGNESIUM OXIDE -MG SUPPLEMENT 400 (240 MG) MG PO TABS
800.0000 mg | ORAL_TABLET | Freq: Once | ORAL | Status: AC
  Administered 2021-07-02: 800 mg via ORAL
  Filled 2021-07-02: qty 2

## 2021-07-02 MED ORDER — ONDANSETRON HCL 4 MG/2ML IJ SOLN: 4.0000 mg | Freq: Four times a day (QID) | INTRAMUSCULAR | Status: DC | PRN

## 2021-07-02 MED ORDER — ACETAMINOPHEN 650 MG RE SUPP: 650.0000 mg | Freq: Four times a day (QID) | RECTAL | Status: DC | PRN

## 2021-07-02 MED ORDER — FLUTICASONE FUROATE-VILANTEROL 200-25 MCG/INH IN AEPB
1.0000 | INHALATION_SPRAY | Freq: Every day | RESPIRATORY_TRACT | Status: DC
  Filled 2021-07-02: qty 28

## 2021-07-02 MED ORDER — ACETAMINOPHEN 325 MG PO TABS
650.0000 mg | ORAL_TABLET | Freq: Four times a day (QID) | ORAL | Status: DC | PRN
  Administered 2021-07-06 – 2021-07-07 (×3): 650 mg via ORAL
  Filled 2021-07-02 (×5): qty 2

## 2021-07-02 MED ORDER — POTASSIUM CHLORIDE 10 MEQ/100ML IV SOLN
10.0000 meq | Freq: Once | INTRAVENOUS | Status: AC
  Administered 2021-07-02: 10 meq via INTRAVENOUS
  Filled 2021-07-02: qty 100

## 2021-07-02 MED ORDER — POTASSIUM CHLORIDE CRYS ER 20 MEQ PO TBCR
40.0000 meq | EXTENDED_RELEASE_TABLET | Freq: Once | ORAL | Status: AC
  Administered 2021-07-02: 40 meq via ORAL
  Filled 2021-07-02: qty 2

## 2021-07-02 MED ORDER — ALBUTEROL SULFATE (2.5 MG/3ML) 0.083% IN NEBU
2.5000 mg | INHALATION_SOLUTION | RESPIRATORY_TRACT | Status: DC | PRN
  Administered 2021-07-02 – 2021-07-03 (×2): 2.5 mg via RESPIRATORY_TRACT
  Filled 2021-07-02 (×3): qty 3

## 2021-07-02 MED ORDER — VANCOMYCIN HCL 750 MG/150ML IV SOLN
750.0000 mg | INTRAVENOUS | Status: DC
  Administered 2021-07-02 – 2021-07-05 (×4): 750 mg via INTRAVENOUS
  Filled 2021-07-02 (×4): qty 150

## 2021-07-02 MED ORDER — ONDANSETRON HCL 4 MG PO TABS: 4.0000 mg | ORAL_TABLET | Freq: Four times a day (QID) | ORAL | Status: DC | PRN

## 2021-07-02 MED ORDER — FLUTICASONE FUROATE-VILANTEROL 200-25 MCG/INH IN AEPB
1.0000 | INHALATION_SPRAY | Freq: Every day | RESPIRATORY_TRACT | Status: DC
  Administered 2021-07-02 – 2021-07-09 (×8): 1 via RESPIRATORY_TRACT

## 2021-07-02 MED ORDER — SODIUM CHLORIDE 0.9 % IV SOLN
2.0000 g | Freq: Two times a day (BID) | INTRAVENOUS | Status: DC
  Administered 2021-07-02 – 2021-07-05 (×6): 2 g via INTRAVENOUS
  Filled 2021-07-02 (×8): qty 2

## 2021-07-02 MED ORDER — ENOXAPARIN SODIUM 40 MG/0.4ML IJ SOSY
40.0000 mg | PREFILLED_SYRINGE | INTRAMUSCULAR | Status: DC
  Administered 2021-07-02 – 2021-07-06 (×5): 40 mg via SUBCUTANEOUS
  Filled 2021-07-02 (×5): qty 0.4

## 2021-07-02 MED ORDER — POLYETHYLENE GLYCOL 3350 17 G PO PACK
17.0000 g | PACK | Freq: Every day | ORAL | Status: DC | PRN
  Administered 2021-07-08 – 2021-07-09 (×2): 17 g via ORAL
  Filled 2021-07-02 (×2): qty 1

## 2021-07-02 NOTE — Progress Notes (Signed)
Admitted early morning hours by nighttime hospitalist.  See history physical for details.  Patient seen and examined.  She is still in the emergency room.  Significant spontaneous onset cellulitis of the right leg.  WBC count already trending down.  Will monitor very closely.  No other source of infection.  Continue broad-spectrum antibiotics. Called and updated patient's daughter.  Pictures from today

## 2021-07-02 NOTE — ED Notes (Signed)
Pt leaving to floor now via stretcher via transport. Pt was given meal tray, but took it up to the floor with her. Family present.

## 2021-07-02 NOTE — ED Provider Notes (Signed)
[facility] EMERGENCY DEPARTMENT Provider Note   CSN: [id number] Arrival date & time: 07/01/21  1921     History Chief Complaint  Patient presents with   Foot Pain   Leg Swelling    Valerie Gay is a 80 y.o. female.  80 yo F with a cc of right leg pain and swelling, and sob.  Going on for a couple days.    The history is provided by the patient.  Foot Pain This is a new problem. The current episode started 2 days ago. The problem occurs constantly. The problem has been gradually worsening. Associated symptoms include shortness of breath. Pertinent negatives include no chest pain and no headaches. Nothing aggravates the symptoms. Nothing relieves the symptoms. She has tried nothing for the symptoms. The treatment provided no relief.      Past Medical History:  Diagnosis Date   Anemia age 73   Cellulitis and abscess of foot 03/2017   rt foot   Colon polyps    2010    COPD (chronic obstructive pulmonary disease) ([facility])    Diverticulosis    History of hiatal hernia    History of kidney stones    Hyperlipidemia    On home oxygen therapy     Patient Active Problem List   Diagnosis Date Noted   Bilateral edema of lower extremity 01/22/2020   Edema 06/23/2019   CAD (coronary artery disease) 06/23/2019   Chronic respiratory failure with hypoxia ([place]) 01/17/2019   Cellulitis of left lower extremity 04/13/2017   GERD (gastroesophageal reflux disease) 04/13/2017   Esophageal stricture    Dysphagia    Abnormal barium swallow    History of colonic polyps    Polyp of ascending colon    Polyp of transverse colon    Acute URI 12/13/2016   Coronary artery calcification 10/23/2016   Hyperlipidemia LDL goal <100 10/23/2016   Medication management 10/23/2016   DOE (dyspnea on exertion) 10/23/2016   Lung nodule 02/26/2015   Nodule of right lung 11/28/2014   Acute calculous cholecystitis 06/22/2014   Acute cholecystitis 06/22/2014   Pre-operative  respiratory examination 06/22/2014   Abnormal chest x-ray 05/28/2014   COPD Gold II D, still smoking 05/07/2014   Nicotine abuse 03/27/2014   Hypokalemia 03/27/2014   Leukocytosis 03/27/2014   TOBACCO USE 11/05/2008   COPD (chronic obstructive pulmonary disease) ([place]) 11/05/2008   HYPERGLYCEMIA 11/05/2008   ANEMIA-NOS 07/21/2007    Past Surgical History:  Procedure Laterality Date   BALLOON DILATION N/A 03/08/2017   Procedure: BALLOON DILATION;  Surgeon: Manus Gunning, MD;  Location: [facility];  Service: Gastroenterology;  Laterality: N/A;   CATARACT EXTRACTION Right    August 15, 2013   CHOLECYSTECTOMY N/A 06/22/2014   Procedure: LAPAROSCOPIC CHOLECYSTECTOMY WITH INTRAOPERATIVE CHOLANGIOGRAM;  Surgeon: Gayland Curry, MD;  Location: [facility];  Service: General;  Laterality: N/A;   COLONOSCOPY N/A 02/19/2017   Procedure: COLONOSCOPY;  Surgeon: Manus Gunning, MD;  Location: [facility];  Service: Gastroenterology;  Laterality: N/A;   ESOPHAGOGASTRODUODENOSCOPY N/A 02/19/2017   Procedure: ESOPHAGOGASTRODUODENOSCOPY (EGD);  Surgeon: Manus Gunning, MD;  Location: [facility];  Service: Gastroenterology;  Laterality: N/A;   ESOPHAGOGASTRODUODENOSCOPY N/A 03/08/2017   Procedure: ESOPHAGOGASTRODUODENOSCOPY (EGD);  Surgeon: Manus Gunning, MD;  Location: [facility];  Service: Gastroenterology;  Laterality: N/A;   ESOPHAGOGASTRODUODENOSCOPY N/A 04/06/2017   Procedure: ESOPHAGOGASTRODUODENOSCOPY (EGD);  Surgeon: Manus Gunning, MD;  Location: [facility];  Service: Gastroenterology;  Laterality: N/A;   ESOPHAGOGASTRODUODENOSCOPY (  EGD) WITH PROPOFOL N/A 04/27/2017   Procedure: ESOPHAGOGASTRODUODENOSCOPY (EGD) WITH PROPOFOL;  Surgeon: Manus Gunning, MD;  Location: [facility];  Service: Gastroenterology;  Laterality: N/A;   NM MYOVIEW LTD  10/2016    EF 55-65% with normal LV function. LOW RISK. No ischemia or infarction.   TONSILLECTOMY      TRANSTHORACIC ECHOCARDIOGRAM  06/2019   EF 60 to 65%.  No R WMA.  Normal RV function.  Mild calcification of aortic valve but no stenosis.  Mild mitral valve leaflet calcification but no stenosis or regurgitation.     OB History   No obstetric history on file.     Family History  Problem Relation Age of Onset   Alzheimer's disease Mother    Diabetes Mother    Cancer Father    Stomach cancer Paternal Grandmother    Colon cancer Neg Hx    Esophageal cancer Neg Hx     Social History   Tobacco Use   Smoking status: Former    Packs/day: 0.50    Years: 60.00    Pack years: 30.00    Types: Cigarettes    Quit date: 02/06/2019    Years since quitting: 2.4   Smokeless tobacco: Never   Tobacco comments:    01/17/19 down to 1-2 cigs per day  Vaping Use   Vaping Use: Never used  Substance Use Topics   Alcohol use: No    Alcohol/week: 0.0 standard drinks   Drug use: No    Home Medications Prior to Admission medications   Medication Sig Start Date End Date Taking? Authorizing Provider  acetaminophen (TYLENOL) 325 MG tablet Take 2 tablets (650 mg total) by mouth every 6 (six) hours as needed for mild pain (or Fever >/= 101). 01/26/20   Nita Sells, MD  albuterol (PROVENTIL HFA;VENTOLIN HFA) 108 (90 Base) MCG/ACT inhaler Inhale 2 puffs into the lungs every 6 (six) hours as needed for wheezing or shortness of breath. 12/20/18   Brand Males, MD  albuterol (PROVENTIL) (2.5 MG/3ML) 0.083% nebulizer solution Take 3 mLs (2.5 mg total) by nebulization every 8 (eight) hours as needed for wheezing. 05/07/21   Brand Males, MD  alendronate (FOSAMAX) 70 MG tablet 1 tablet 30 minutes before the first food, beverage or medicine of the day with plain water 06/25/20   [provider]  Aloe-Sodium Chloride (AYR SALINE NASAL GEL NA) Place 1 application into the nose 6 (six) times daily.    [provider]  Budeson-Glycopyrrol-Formoterol (BREZTRI AEROSPHERE) 160-9-4.8  MCG/ACT AERO Inhale 2 puffs into the lungs in the morning and at bedtime. 05/07/21   Brand Males, MD  doxycycline (VIBRA-TABS) 100 MG tablet Take 1 tablet (100 mg total) by mouth every 12 (twelve) hours. 01/26/20   Nita Sells, MD  furosemide (LASIX) 40 MG tablet TAKE 1 TABLET(40 MG) BY MOUTH DAILY.may take an additional 40 mg if need for increase swelling or shortness of breath 03/04/21   Leonie Man, MD  hydroxypropyl methylcellulose / hypromellose (ISOPTO TEARS / GONIOVISC) 2.5 % ophthalmic solution Place 2 drops into both eyes 3 (three) times daily as needed for dry eyes.    [provider]  magnesium citrate SOLN Take by mouth daily as needed for severe constipation (constipation).    [provider]  OXYGEN Inhale 3 L into the lungs continuous.    [provider]  polyethylene glycol (MIRALAX / GLYCOLAX) 17 g packet Take 17 g by mouth daily as needed for mild constipation. 01/26/20  Nita Sells, MD  predniSONE (STERAPRED UNI-PAK 21 TAB) 10 MG (21) TBPK tablet Take by mouth daily. Take 40 mg x1 day, then 30 mg x1 day, then 20 mg x1 day, then 10 mg x1 day, and then 5 mg x1 day and stop 02/14/21   Brand Males, MD  sodium chloride (OCEAN) 0.65 % nasal spray Place 1 spray into the nose 6 (six) times daily.    [provider]  UNKNOWN TO PATIENT Place into the right eye See admin instructions. Eye injection (name unknown to patient): Inject into the right eye every 7-8 weeks for wet macular degeneration    [provider]    Allergies    Spiriva handihaler [tiotropium bromide monohydrate], Banana, and Other  Review of Systems   Review of Systems  Constitutional:  Negative for chills and fever.  HENT:  Negative for congestion and rhinorrhea.   Eyes:  Negative for redness and visual disturbance.  Respiratory:  Positive for shortness of breath. Negative for wheezing.   Cardiovascular:  Negative for chest pain and  palpitations.  Gastrointestinal:  Negative for nausea and vomiting.  Genitourinary:  Negative for dysuria and urgency.  Musculoskeletal:  Negative for arthralgias and myalgias.  Skin:  Negative for pallor and wound.  Neurological:  Negative for dizziness and headaches.   Physical Exam Updated Vital Signs BP (!) 107/40 (BP Location: Left Arm)   Pulse (!) 106   Temp 97.6 F (36.4 C) (Oral)   Resp (!) 22   Ht '5\' 2"'$  (1.575 m)   Wt 62 kg   SpO2 97%   BMI 25.00 kg/m   Physical Exam Vitals and nursing note reviewed.  Constitutional:      General: She is not in acute distress.    Appearance: She is well-developed. She is not diaphoretic.  HENT:     Head: Normocephalic and atraumatic.  Eyes:     Pupils: Pupils are equal, round, and reactive to light.  Cardiovascular:     Rate and Rhythm: Normal rate and regular rhythm.     Heart sounds: No murmur heard.   No friction rub. No gallop.  Pulmonary:     Effort: Pulmonary effort is normal.     Breath sounds: No wheezing or rales.     Comments: Diminished breath sounds in all fields.   Abdominal:     General: There is no distension.     Palpations: Abdomen is soft.     Tenderness: There is no abdominal tenderness.  Musculoskeletal:        General: No tenderness.     Cervical back: Normal range of motion and neck supple.  Skin:    General: Skin is dry.     Comments: Erythema edema and warmth to the right lower extremity with some red streaking on the medial aspect just past the knee.  Fairly large area of erythema.  Neurological:     Mental Status: She is alert and oriented to person, place, and time.  Psychiatric:        Behavior: Behavior normal.    ED Results / Procedures / Treatments   Labs (all labs ordered are listed, but only abnormal results are displayed) Labs Reviewed  CBC WITH DIFFERENTIAL/PLATELET - Abnormal; Notable for the following components:      Result Value   WBC 40.1 (*)    RBC 3.37 (*)    Hemoglobin  10.8 (*)    HCT 33.4 (*)    Platelets 513 (*)  Neutro Abs 36.7 (*)    Monocytes Absolute 1.3 (*)    Abs Immature Granulocytes 0.64 (*)    All other components within normal limits  COMPREHENSIVE METABOLIC PANEL - Abnormal; Notable for the following components:   Potassium 2.9 (*)    Chloride 95 (*)    Glucose, Bld 105 (*)    Creatinine, Ser 1.03 (*)    Total Protein 6.4 (*)    Albumin 2.8 (*)    GFR, Estimated 54 (*)    All other components within normal limits  RESP PANEL BY RT-PCR (FLU A&B, COVID) ARPGX2  CULTURE, BLOOD (ROUTINE X 2)  CULTURE, BLOOD (ROUTINE X 2)    EKG EKG Interpretation  Date/Time:  Tuesday July 01 2021 23:33:25 EDT Ventricular Rate:  116 PR Interval:  144 QRS Duration: 83 QT Interval:  355 QTC Calculation: 494 R Axis:   -26 Text Interpretation: Sinus tachycardia Right atrial enlargement Borderline left axis deviation Low voltage, extremity leads Repol abnrm suggests ischemia, diffuse leads Minimal ST elevation, lateral leads Baseline wander in lead(s) V3 Baseline wander TECHNICALLY DIFFICULT Otherwise no significant change Confirmed by Deno Etienne [phone]) on 07/01/2021 11:58:30 PM  Radiology No results found.  Procedures Procedures   Medications Ordered in ED Medications  cefTRIAXone (ROCEPHIN) 2 g in sodium chloride 0.9 % 100 mL IVPB (has no administration in time range)  ipratropium-albuterol (DUONEB) 0.5-2.5 (3) MG/3ML nebulizer solution 3 mL (has no administration in time range)    ED Course  I have reviewed the triage vital signs and the nursing notes.  Pertinent labs & imaging results that were available during my care of the patient were reviewed by me and considered in my medical decision making (see chart for details).    MDM Rules/Calculators/A&P                           80 yo F with a chief complaints of right leg pain and swelling going on for a couple days now.  She wore some diabetic footwear that she thinks irritated her  foot.  No breaks in the skin.  Clinically has cellulitis with some red streaking.  Fairly large area of erythema with a white count of greater than 40,000.  We will discuss with hospitalist for observation.  She is also complaining of difficulty breathing.  Has diminished breath sounds in all fields history of COPD we will give a breathing treatment chest x-ray reassess.  Chest x-ray viewed by me with right-sided infiltrative likely atelectasis.   Discussed with hospitalist.   The patients results and plan were reviewed and discussed.   Any x-rays performed were independently reviewed by myself.   Differential diagnosis were considered with the presenting HPI.  Medications  ipratropium-albuterol (DUONEB) 0.5-2.5 (3) MG/3ML nebulizer solution 3 mL (has no administration in time range)  potassium chloride 10 mEq in 100 mL IVPB (has no administration in time range)  potassium chloride SA (KLOR-CON) CR tablet 40 mEq (40 mEq Oral Given 07/02/21 0031)  magnesium oxide (MAG-OX) tablet 800 mg (800 mg Oral Given 07/02/21 0030)    Vitals:   07/01/21 1923 07/01/21 1928 07/01/21 1931 07/01/21 2230  BP:  123/73  (!) 107/40  Pulse:  (!) 136  (!) 106  Resp:  18  (!) 22  Temp:   97.6 F (36.4 C)   TempSrc:   Oral   SpO2:  100%  97%  Weight: 62 kg     Height: 5'  2" (1.575 m)       Final diagnoses:  None    Admission/ observation were discussed with the admitting physician, patient and/or family and they are comfortable with the plan.    Final Clinical Impression(s) / ED Diagnoses Final diagnoses:  None    Rx / DC Orders ED Discharge Orders     None        Deno Etienne, DO 07/02/21 [phone]

## 2021-07-02 NOTE — Progress Notes (Signed)
Pharmacy Antibiotic Note  Valerie Gay is a 80 y.o. female admitted on 07/01/2021 with cellulitis.  Pharmacy has been consulted for Vancomycin/Cefepime dosing for RLE cellulitis. WBC is markedly elevated. Renal function ok.   Plan: Vancomycin 750 mg IV q24h >>Estimated AUC: 524 Cefepime 2g IV q12h Trend WBC, temp, renal function  F/U infectious work-up Drug levels as indicated   Height: '5\' 2"'$  (157.5 cm) Weight: 62 kg (136 lb 11 oz) IBW/kg (Calculated) : 50.1  Temp (24hrs), Avg:97.6 F (36.4 C), Min:97.6 F (36.4 C), Max:97.6 F (36.4 C)  Recent Labs  Lab 07/01/21 1954  WBC 40.1*  CREATININE 1.03*    Estimated Creatinine Clearance: 36.5 mL/min (A) (by C-G formula based on SCr of 1.03 mg/dL (H)).    Allergies  Allergen Reactions   Spiriva Handihaler [Tiotropium Bromide Monohydrate] Hives, Itching and Rash     on upper body   Banana Itching   Other Other (See Comments)    Narda Bonds, PharmD, BCPS Clinical Pharmacist Phone: [phone]

## 2021-07-02 NOTE — ED Notes (Signed)
Attempted to call report on pt. They said they will call me back in 5 minutes.

## 2021-07-02 NOTE — ED Notes (Signed)
Attempted to given report. She said she will call me back.

## 2021-07-02 NOTE — H&P (Signed)
History and Physical    Valerie Gay [id number] DOB: [birth year] DOA: 07/01/2021  PCP: Kathyrn Lass, MD  Patient coming from: Home via EMS   Chief Complaint:  Chief Complaint  Patient presents with   Foot Pain   Leg Swelling     HPI:    80 year old female with past medical history of COPD, chronic respiratory failure (on 3LPM via [place]), several hospitalizations for cellulitis in the past who presents to [facility] emergency department with complaints of right lower extremity pain redness and swelling.  Patient explains that approximately 2 days ago she began to develop pain in her right foot and ankle.  This pain was burning in quality and rapidly became severe in intensity.  Pain mainly started in her foot and ankle but radiated up her leg.  Pain is worse with weightbearing or ambulation or movement of the affected extremity.  Over the following 2 days patient's increasing pain became associated with increasing redness and swelling of the affected extremity.  Patient states that her swelling redness and pain have gotten so bad that she is unable to ambulate.  Patient complaining of associated generalized weakness and poor appetite as a result.  Patient denies associated pain however.  Patient eventually contacted EMS due to the severity of her symptoms and was promptly brought into [facility] emergency department for evaluation.  Upon evaluation in the emergency department patient was found to have extensive right lower extremity cellulitis.  Patient additionally found to have a substantial leukocytosis of 40.1.  Patient was initiated on intravenous antibiotics with intravenous ceftriaxone in the hospitalist group was then Called to assess patient for admission to the hospital.  Review of Systems:   Review of Systems  Musculoskeletal:  Positive for myalgias.       Right leg pain and swelling  All other systems reviewed and are negative.  Past Medical  History:  Diagnosis Date   Anemia age 69   Cellulitis and abscess of foot 03/2017   rt foot   Colon polyps    2010    COPD (chronic obstructive pulmonary disease) (HCC)    Diverticulosis    History of hiatal hernia    History of kidney stones    Hyperlipidemia    On home oxygen therapy     Past Surgical History:  Procedure Laterality Date   BALLOON DILATION N/A 03/08/2017   Procedure: BALLOON DILATION;  Surgeon: Manus Gunning, MD;  Location: [facility];  Service: Gastroenterology;  Laterality: N/A;   CATARACT EXTRACTION Right    August 15, 2013   CHOLECYSTECTOMY N/A 06/22/2014   Procedure: LAPAROSCOPIC CHOLECYSTECTOMY WITH INTRAOPERATIVE CHOLANGIOGRAM;  Surgeon: Gayland Curry, MD;  Location: [facility];  Service: General;  Laterality: N/A;   COLONOSCOPY N/A 02/19/2017   Procedure: COLONOSCOPY;  Surgeon: Manus Gunning, MD;  Location: [facility];  Service: Gastroenterology;  Laterality: N/A;   ESOPHAGOGASTRODUODENOSCOPY N/A 02/19/2017   Procedure: ESOPHAGOGASTRODUODENOSCOPY (EGD);  Surgeon: Manus Gunning, MD;  Location: [facility];  Service: Gastroenterology;  Laterality: N/A;   ESOPHAGOGASTRODUODENOSCOPY N/A 03/08/2017   Procedure: ESOPHAGOGASTRODUODENOSCOPY (EGD);  Surgeon: Manus Gunning, MD;  Location: [facility];  Service: Gastroenterology;  Laterality: N/A;   ESOPHAGOGASTRODUODENOSCOPY N/A 04/06/2017   Procedure: ESOPHAGOGASTRODUODENOSCOPY (EGD);  Surgeon: Manus Gunning, MD;  Location: [facility];  Service: Gastroenterology;  Laterality: N/A;   ESOPHAGOGASTRODUODENOSCOPY (EGD) WITH PROPOFOL N/A 04/27/2017   Procedure: ESOPHAGOGASTRODUODENOSCOPY (EGD) WITH PROPOFOL;  Surgeon: Manus Gunning, MD;  Location: [facility];  Service: Gastroenterology;  Laterality: N/A;   NM MYOVIEW LTD  10/2016    EF 55-65% with normal LV function. LOW RISK. No ischemia or infarction.   TONSILLECTOMY     TRANSTHORACIC ECHOCARDIOGRAM  06/2019   EF  60 to 65%.  No R WMA.  Normal RV function.  Mild calcification of aortic valve but no stenosis.  Mild mitral valve leaflet calcification but no stenosis or regurgitation.     reports that she quit smoking about 2 years ago. Her smoking use included cigarettes. She has a 30.00 pack-year smoking history. She has never used smokeless tobacco. She reports that she does not drink alcohol and does not use drugs.  Allergies  Allergen Reactions   Spiriva Handihaler [Tiotropium Bromide Monohydrate] Hives, Itching and Rash     on upper body   Banana Itching    Family History  Problem Relation Age of Onset   Alzheimer's disease Mother    Diabetes Mother    Cancer Father    Stomach cancer Paternal Grandmother    Colon cancer Neg Hx    Esophageal cancer Neg Hx      Prior to Admission medications   Medication Sig Start Date End Date Taking? Authorizing Provider  acetaminophen (TYLENOL) 325 MG tablet Take 2 tablets (650 mg total) by mouth every 6 (six) hours as needed for mild pain (or Fever >/= 101). 01/26/20   Nita Sells, MD  albuterol (PROVENTIL HFA;VENTOLIN HFA) 108 (90 Base) MCG/ACT inhaler Inhale 2 puffs into the lungs every 6 (six) hours as needed for wheezing or shortness of breath. 12/20/18   Brand Males, MD  albuterol (PROVENTIL) (2.5 MG/3ML) 0.083% nebulizer solution Take 3 mLs (2.5 mg total) by nebulization every 8 (eight) hours as needed for wheezing. 05/07/21   Brand Males, MD  alendronate (FOSAMAX) 70 MG tablet 1 tablet 30 minutes before the first food, beverage or medicine of the day with plain water 06/25/20   [provider]  Aloe-Sodium Chloride (AYR SALINE NASAL GEL NA) Place 1 application into the nose 6 (six) times daily.    [provider]  Budeson-Glycopyrrol-Formoterol (BREZTRI AEROSPHERE) 160-9-4.8 MCG/ACT AERO Inhale 2 puffs into the lungs in the morning and at bedtime. 05/07/21   Brand Males, MD  doxycycline (VIBRA-TABS) 100 MG  tablet Take 1 tablet (100 mg total) by mouth every 12 (twelve) hours. 01/26/20   Nita Sells, MD  furosemide (LASIX) 40 MG tablet TAKE 1 TABLET(40 MG) BY MOUTH DAILY.may take an additional 40 mg if need for increase swelling or shortness of breath 03/04/21   Leonie Man, MD  hydroxypropyl methylcellulose / hypromellose (ISOPTO TEARS / GONIOVISC) 2.5 % ophthalmic solution Place 2 drops into both eyes 3 (three) times daily as needed for dry eyes.    [provider]  magnesium citrate SOLN Take by mouth daily as needed for severe constipation (constipation).    [provider]  OXYGEN Inhale 3 L into the lungs continuous.    [provider]  polyethylene glycol (MIRALAX / GLYCOLAX) 17 g packet Take 17 g by mouth daily as needed for mild constipation. 01/26/20   Nita Sells, MD  predniSONE (STERAPRED UNI-PAK 21 TAB) 10 MG (21) TBPK tablet Take by mouth daily. Take 40 mg x1 day, then 30 mg x1 day, then 20 mg x1 day, then 10 mg x1 day, and then 5 mg x1 day and stop 02/14/21   Brand Males, MD  sodium chloride (OCEAN) 0.65 % nasal spray Place 1 spray  into the nose 6 (six) times daily.    [provider]  UNKNOWN TO PATIENT Place into the right eye See admin instructions. Eye injection (name unknown to patient): Inject into the right eye every 7-8 weeks for wet macular degeneration    [provider]    Physical Exam: Vitals:   07/02/21 0300 07/02/21 0400 07/02/21 0500 07/02/21 0600  BP: (!) 111/44 (!) 115/52 (!) 109/38 (!) 111/44  Pulse: 91 (!) 108 88 (!) 105  Resp: '18 20 20 '$ (!) 23  Temp:      TempSrc:      SpO2: 100% 100% 100% 100%  Weight:      Height:        Constitutional: Awake alert and oriented x3, no associated distress.   Skin: Extremely thin dry and flaky skin throughout.  Right foot is markedly red and edematous with significant warmth with redness warmth and edema that tracks all the way up through the knee extremely  poor skin turgor noted.. Eyes: Pupils are equally reactive to light.  No evidence of scleral icterus or conjunctival pallor.  ENMT: Slightly dry mucous membranes noted.  Posterior pharynx clear of any exudate or lesions.   Neck: normal, supple, no masses, no thyromegaly.  No evidence of jugular venous distension.   Respiratory: scattered rhonchi bilaterally.  no wheezing, no crackles. Normal respiratory effort. No accessory muscle use.  Cardiovascular: Regular rate and rhythm, no murmurs / rubs / gallops. No extremity edema. 2+ pedal pulses. No carotid bruits.  Chest:   Nontender without crepitus or deformity.   Back:   Nontender without crepitus or deformity. Abdomen: Abdomen is soft and nontender.  No evidence of intra-abdominal masses.  Positive bowel sounds noted in all quadrants.   Musculoskeletal: Right lower extremity edema with associated tenderness with skin examination findings as noted above.  Good ROM, no contractures.  Poor muscle tone.  Neurologic: CN 2-12 grossly intact. Sensation intact.  Patient moving all 4 extremities spontaneously.  Patient is following all commands.  Patient is responsive to verbal stimuli.   Psychiatric: Patient exhibits normal mood with appropriate affect.  Patient seems to possess insight as to their current situation.     Labs on Admission: I have personally reviewed following labs and imaging studies -   CBC: Recent Labs  Lab 07/01/21 1954 07/02/21 0322  WBC 40.1* 34.6*  NEUTROABS 36.7* 31.0*  HGB 10.8* 9.4*  HCT 33.4* 29.7*  MCV 99.1 101.7*  PLT 513* [id number]*   Basic Metabolic Panel: Recent Labs  Lab 07/01/21 1954 07/02/21 0322  NA 138 138  K 2.9* 3.7  CL 95* 99  CO2 32 29  GLUCOSE 105* 92  BUN 21 25*  CREATININE 1.03* 1.09*  CALCIUM 9.0 8.6*  MG 2.1 2.0   GFR: Estimated Creatinine Clearance: 34.5 mL/min (A) (by C-G formula based on SCr of 1.09 mg/dL (H)). Liver Function Tests: Recent Labs  Lab 07/01/21 1954 07/02/21 0322  AST  18 16  ALT 14 11  ALKPHOS 87 91  BILITOT 1.1 0.9  PROT 6.4* 5.4*  ALBUMIN 2.8* 2.3*   No results for input(s): LIPASE, AMYLASE in the last 168 hours. No results for input(s): AMMONIA in the last 168 hours. Coagulation Profile: No results for input(s): INR, PROTIME in the last 168 hours. Cardiac Enzymes: No results for input(s): CKTOTAL, CKMB, CKMBINDEX, TROPONINI in the last 168 hours. BNP (last 3 results) No results for input(s): PROBNP in the last 8760 hours. HbA1C: No results for input(s):  HGBA1C in the last 72 hours. CBG: No results for input(s): GLUCAP in the last 168 hours. Lipid Profile: No results for input(s): CHOL, HDL, LDLCALC, TRIG, CHOLHDL, LDLDIRECT in the last 72 hours. Thyroid Function Tests: No results for input(s): TSH, T4TOTAL, FREET4, T3FREE, THYROIDAB in the last 72 hours. Anemia Panel: No results for input(s): VITAMINB12, FOLATE, FERRITIN, TIBC, IRON, RETICCTPCT in the last 72 hours. Urine analysis:    Component Value Date/Time   COLORURINE YELLOW 04/13/2017 1215   APPEARANCEUR HAZY (A) 04/13/2017 1215   LABSPEC 1.025 04/13/2017 1215   PHURINE 5.0 04/13/2017 1215   GLUCOSEU NEGATIVE 04/13/2017 1215   HGBUR SMALL (A) 04/13/2017 1215   BILIRUBINUR NEGATIVE 04/13/2017 1215   KETONESUR 20 (A) 04/13/2017 1215   PROTEINUR 30 (A) 04/13/2017 1215   UROBILINOGEN 1.0 03/29/2014 1032   NITRITE NEGATIVE 04/13/2017 1215   LEUKOCYTESUR NEGATIVE 04/13/2017 1215    Radiological Exams on Admission - Personally Reviewed: DG Chest Port 1 View  Result Date: 07/02/2021 CLINICAL DATA:  Shortness of breath EXAM: PORTABLE CHEST 1 VIEW COMPARISON:  12/08/2018 FINDINGS: Lungs are clear.  No pleural effusion or pneumothorax. The heart is normal in size.  Thoracic aortic atherosclerosis. IMPRESSION: No evidence of acute cardiopulmonary disease. Electronically Signed   By: Julian Hy M.D.   On: 07/02/2021 00:17    EKG: Personally reviewed.  Rhythm is sinus tachycardia  with heart rate of 116 bpm.  No dynamic ST segment changes appreciated.  Assessment/Plan Principal Problem:   Cellulitis of right lower extremity  Patient exhibiting rather rapid onset of substantial cellulitis of the right lower extremity This is associated with a profound leukocytosis of 40.1 and mild thrombocytosis suggestive of severe inflammation On physical examination there is no evidence of crepitus or areas of fluctuance concerning for necrotizing fasciitis or underlying abscess. Due to profound leukocytosis patient was placed on regimen of antibiotics for "severe" nonpurulent cellulitis per the cellulitis order set which includes cefepime and vancomycin Blood cultures obtained De-escalate antibiotics once patient is clinically improving  Active Problems:   COPD (chronic obstructive pulmonary disease) ([place])  Patient presents with substantial COPD with associated chronic respiratory failure Exam is not consistent with COPD exacerbation Continue home regimen of maintenance inhaler As needed bronchodilator therapy for shortness of breath and wheezing    Hypokalemia  Replacing with oral and intravenous potassium chloride Monitoring potassium levels with serial chemistries    Chronic respiratory failure with hypoxia (HCC)  Patient is typically on 3 L of oxygen via nasal cannula at baseline No evidence of increased oxygen requirements    Nicotine dependence, cigarettes, uncomplicated  Will counsel on cessation   Code Status:  Full code Family Communication: deferred   Status is: Inpatient  Remains inpatient appropriate because:Ongoing diagnostic testing needed not appropriate for outpatient work up, IV treatments appropriate due to intensity of illness or inability to take PO, and Inpatient level of care appropriate due to severity of illness  Dispo: The patient is from: Home              Anticipated d/c is to: Home              Patient currently is not medically  stable to d/c.   Difficult to place patient Yes        Vernelle Emerald MD [facility] Pager [phone]  If 7PM-7AM, please contact night-coverage [email or web address] Use universal [place] password for that web site. If you do not have the password, please call the  hospital operator.  07/02/2021, 8:04 AM

## 2021-07-02 NOTE — ED Notes (Signed)
I introduced myself to pt. Purewick placed on pt. Pt AxO x4. GCS 14. I showed pt how to use phone to call daughter and ask for her dentures. O2 turned up to 4L [place] per pt request (for comfort). Pt denies further needs.

## 2021-07-03 DIAGNOSIS — L03115 Cellulitis of right lower limb: Secondary | ICD-10-CM | POA: Diagnosis not present

## 2021-07-03 LAB — BASIC METABOLIC PANEL
Anion gap: 10 (ref 5–15)
BUN: 28 mg/dL — ABNORMAL HIGH (ref 8–23)
CO2: 28 mmol/L (ref 22–32)
Calcium: 8.8 mg/dL — ABNORMAL LOW (ref 8.9–10.3)
Chloride: 102 mmol/L (ref 98–111)
Creatinine, Ser: 1.13 mg/dL — ABNORMAL HIGH (ref 0.44–1.00)
GFR, Estimated: 49 mL/min — ABNORMAL LOW (ref >60)
Glucose, Bld: 92 mg/dL (ref 70–99)
Potassium: 3.5 mmol/L (ref 3.5–5.1)
Sodium: 140 mmol/L (ref 135–145)

## 2021-07-03 LAB — CBC WITH DIFFERENTIAL/PLATELET
Abs Immature Granulocytes: 0.18 10*3/uL — ABNORMAL HIGH (ref 0.00–0.07)
Basophils Absolute: 0.1 10*3/uL (ref 0.0–0.1)
Basophils Relative: 0 %
Eosinophils Absolute: 0.3 10*3/uL (ref 0.0–0.5)
Eosinophils Relative: 1 %
HCT: 26.1 % — ABNORMAL LOW (ref 36.0–46.0)
Hemoglobin: 8.2 g/dL — ABNORMAL LOW (ref 12.0–15.0)
Immature Granulocytes: 1 %
Lymphocytes Relative: 5 %
Lymphs Abs: 1 10*3/uL (ref 0.7–4.0)
MCH: 31.4 pg (ref 26.0–34.0)
MCHC: 31.4 g/dL (ref 30.0–36.0)
MCV: 100 fL (ref 80.0–100.0)
Monocytes Absolute: 0.9 10*3/uL (ref 0.1–1.0)
Monocytes Relative: 4 %
Neutro Abs: 20.1 10*3/uL — ABNORMAL HIGH (ref 1.7–7.7)
Neutrophils Relative %: 89 %
Platelets: 429 10*3/uL — ABNORMAL HIGH (ref 150–400)
RBC: 2.61 MIL/uL — ABNORMAL LOW (ref 3.87–5.11)
RDW: 14.8 % (ref 11.5–15.5)
WBC: 22.6 10*3/uL — ABNORMAL HIGH (ref 4.0–10.5)
nRBC: 0 % (ref 0.0–0.2)

## 2021-07-03 LAB — PHOSPHORUS: Phosphorus: 2.8 mg/dL (ref 2.5–4.6)

## 2021-07-03 LAB — MAGNESIUM: Magnesium: 2.3 mg/dL (ref 1.7–2.4)

## 2021-07-03 MED ORDER — ALBUTEROL SULFATE (2.5 MG/3ML) 0.083% IN NEBU
2.5000 mg | INHALATION_SOLUTION | RESPIRATORY_TRACT | Status: DC
  Administered 2021-07-03 – 2021-07-04 (×9): 2.5 mg via RESPIRATORY_TRACT
  Filled 2021-07-03 (×11): qty 3

## 2021-07-03 NOTE — Plan of Care (Addendum)
  Problem: Education: Goal: Knowledge of General Education information will improve Description: Including pain rating scale, medication(s)/side effects and non-pharmacologic comfort measures Outcome: Progressing   Problem: Health Behavior/Discharge Planning: Goal: Ability to manage health-related needs will improve Outcome: Progressing   Problem: Clinical Measurements: Goal: Ability to maintain clinical measurements within normal limits will improve Outcome: Progressing Goal: Will remain free from infection Outcome: Progressing Goal: Diagnostic test results will improve Outcome: Progressing Goal: Respiratory complications will improve Outcome: Progressing Goal: Cardiovascular complication will be avoided Outcome: Progressing   Problem: Activity: Goal: Risk for activity intolerance will decrease Outcome: Progressing   Problem: Nutrition: Goal: Adequate nutrition will be maintained Outcome: Progressing   Problem: Coping: Goal: Level of anxiety will decrease Outcome: Progressing   Problem: Elimination: Goal: Will not experience complications related to bowel motility Outcome: Progressing Goal: Will not experience complications related to urinary retention Outcome: Progressing   Problem: Pain Managment: Goal: General experience of comfort will improve Outcome: Progressing   Problem: Safety: Goal: Ability to remain free from injury will improve Outcome: Progressing   Problem: Skin Integrity: Goal: Risk for impaired skin integrity will decrease Outcome: Progressing  Skin tear to left upper arm, during repositioning with NT. This RN called to room and placed gauze and tape.

## 2021-07-03 NOTE — Progress Notes (Signed)
PROGRESS NOTE    Valerie Gay  [id number] DOB: 10-22-1941 DOA: 07/01/2021 PCP: Kathyrn Lass, MD    Brief Narrative:  Patient with history of COPD, chronic hypoxemic respiratory failure on 3 L oxygen at home, previous cellulitis of the leg presented to the emergency department with complaints of right lower extremity pain redness and swelling for 2 days.  In the emergency room, hemodynamically stable.  Leukocytosis with WBC count 40,000.  Started on IV antibiotics and admitted to the hospital.   Assessment & Plan:   Principal Problem:   Cellulitis of right lower extremity Active Problems:   COPD (chronic obstructive pulmonary disease) (HCC)   Hypokalemia   Chronic respiratory failure with hypoxia (HCC)   Nicotine dependence, cigarettes, uncomplicated  Cellulitis of the right lower extremity: Admitted with severe extending cellulitis of right leg.  Clinically improving. Remains on vancomycin and cefepime.  We will continue today.  Her redness and erythema has improved as well as WBC count.  No clinical evidence of internal abscess or collection. Once clinically improved, will need extended course of antibiotics for 10 days.  COPD with chronic hypoxemic respiratory failure: Patient does have severe COPD.  Using 3 L oxygen that she uses at home. Patient is on Dulera that she will continue.  Albuterol as needed.  Hypokalemia: Replace and monitor levels.  Magnesium is adequate.     DVT prophylaxis: enoxaparin (LOVENOX) injection 40 mg Start: 07/02/21 1000   Code Status: Full code Family Communication: Daughter on the phone Disposition Plan: Status is: Inpatient  Remains inpatient appropriate because:Inpatient level of care appropriate due to severity of illness  Dispo: The patient is from: Home              Anticipated d/c is to: Home              Patient currently is not medically stable to d/c.   Difficult to place patient No         Consultants:  None    Procedures:  None   Antimicrobials:  Antibiotics Given (last 72 hours)     Date/Time Action Medication Dose Rate   07/02/21 0034 New Bag/Given   cefTRIAXone (ROCEPHIN) 2 g in sodium chloride 0.9 % 100 mL IVPB 2 g 200 mL/hr   07/02/21 0242 New Bag/Given   ceFEPIme (MAXIPIME) 2 g in sodium chloride 0.9 % 100 mL IVPB 2 g 200 mL/hr   07/02/21 0335 New Bag/Given   vancomycin (VANCOREADY) IVPB 750 mg/150 mL 750 mg 150 mL/hr   07/02/21 0914 New Bag/Given   ceFEPIme (MAXIPIME) 2 g in sodium chloride 0.9 % 100 mL IVPB 2 g 200 mL/hr   07/02/21 2134 New Bag/Given   ceFEPIme (MAXIPIME) 2 g in sodium chloride 0.9 % 100 mL IVPB 2 g 200 mL/hr   07/03/21 0229 New Bag/Given   vancomycin (VANCOREADY) IVPB 750 mg/150 mL 750 mg 150 mL/hr   07/03/21 [id number] New Bag/Given   ceFEPIme (MAXIPIME) 2 g in sodium chloride 0.9 % 100 mL IVPB  200 mL/hr          Subjective: Patient seen and examined.  No overnight events.  Afebrile.  Right leg is much better and less painful, however she has more shortness of breath today.  Objective: Vitals:   07/03/21 0538 07/03/21 0732 07/03/21 1051 07/03/21 1214  BP: (!) 125/57   130/68  Pulse: (!) 117 99 (!) 120 (!) 119  Resp: 20 20 (!) 22 20  Temp: 97.9 F (36.6 C)  97.6 F (36.4 C)  TempSrc:      SpO2: 99% 98% 97% 100%  Weight:      Height:       No intake or output data in the 24 hours ending 07/03/21 1344 Filed Weights   07/01/21 1923  Weight: 62 kg    Examination:  General exam: In mild respiratory stress with shortness of breath.  On 3 L oxygen. Respiratory system: Poor bilateral air entry.  No added sounds. Cardiovascular system: S1 & S2 heard, RRR.  Gastrointestinal system: Soft and nontender.  Bowel sounds present. Central nervous system: Alert and oriented. No focal neurological deficits. Extremities: Symmetric 5 x 5 power. Patient has edematous swelling of the right leg.  Redness and erythema is receding and improving.  Distal  neurovascular status intact.    Data Reviewed: I have personally reviewed following labs and imaging studies  CBC: Recent Labs  Lab 07/01/21 1954 07/02/21 0322 07/03/21 0111  WBC 40.1* 34.6* 22.6*  NEUTROABS 36.7* 31.0* 20.1*  HGB 10.8* 9.4* 8.2*  HCT 33.4* 29.7* 26.1*  MCV 99.1 101.7* 100.0  PLT 513* 417* [id number]*   Basic Metabolic Panel: Recent Labs  Lab 07/01/21 1954 07/02/21 0322 07/03/21 0111  NA 138 138 140  K 2.9* 3.7 3.5  CL 95* 99 102  CO2 32 29 28  GLUCOSE 105* 92 92  BUN 21 25* 28*  CREATININE 1.03* 1.09* 1.13*  CALCIUM 9.0 8.6* 8.8*  MG 2.1 2.0 2.3  PHOS  --   --  2.8   GFR: Estimated Creatinine Clearance: 33.3 mL/min (A) (by C-G formula based on SCr of 1.13 mg/dL (H)). Liver Function Tests: Recent Labs  Lab 07/01/21 1954 07/02/21 0322  AST 18 16  ALT 14 11  ALKPHOS 87 91  BILITOT 1.1 0.9  PROT 6.4* 5.4*  ALBUMIN 2.8* 2.3*   No results for input(s): LIPASE, AMYLASE in the last 168 hours. No results for input(s): AMMONIA in the last 168 hours. Coagulation Profile: No results for input(s): INR, PROTIME in the last 168 hours. Cardiac Enzymes: No results for input(s): CKTOTAL, CKMB, CKMBINDEX, TROPONINI in the last 168 hours. BNP (last 3 results) No results for input(s): PROBNP in the last 8760 hours. HbA1C: No results for input(s): HGBA1C in the last 72 hours. CBG: No results for input(s): GLUCAP in the last 168 hours. Lipid Profile: No results for input(s): CHOL, HDL, LDLCALC, TRIG, CHOLHDL, LDLDIRECT in the last 72 hours. Thyroid Function Tests: No results for input(s): TSH, T4TOTAL, FREET4, T3FREE, THYROIDAB in the last 72 hours. Anemia Panel: No results for input(s): VITAMINB12, FOLATE, FERRITIN, TIBC, IRON, RETICCTPCT in the last 72 hours. Sepsis Labs: Recent Labs  Lab 07/02/21 0322  PROCALCITON 0.89  LATICACIDVEN 1.1    Recent Results (from the past 240 hour(s))  Resp Panel by RT-PCR (Flu A&B, Covid) Nasopharyngeal Swab      Status: None   Collection Time: 07/01/21  7:35 PM   Specimen: Nasopharyngeal Swab; Nasopharyngeal(NP) swabs in vial transport medium  Result Value Ref Range Status   SARS Coronavirus 2 by RT PCR NEGATIVE NEGATIVE Final    Comment: (NOTE) SARS-CoV-2 target nucleic acids are NOT DETECTED.  The SARS-CoV-2 RNA is generally detectable in upper respiratory specimens during the acute phase of infection. The lowest concentration of SARS-CoV-2 viral copies this assay can detect is 138 copies/mL. A negative result does not preclude SARS-Cov-2 infection and should not be used as the sole basis for treatment or other patient management decisions. A negative  result may occur with  improper specimen collection/handling, submission of specimen other than nasopharyngeal swab, presence of viral mutation(s) within the areas targeted by this assay, and inadequate number of viral copies(<138 copies/mL). A negative result must be combined with clinical observations, patient history, and epidemiological information. The expected result is Negative.  Fact Sheet for Patients:  EntrepreneurPulse.com.au  Fact Sheet for Healthcare Providers:  IncredibleEmployment.be  This test is no t yet approved or cleared by the [place] FDA and  has been authorized for detection and/or diagnosis of SARS-CoV-2 by FDA under an Emergency Use Authorization (EUA). This EUA will remain  in effect (meaning this test can be used) for the duration of the COVID-19 declaration under Section 564(b)(1) of the Act, 21 U.S.C.section 360bbb-3(b)(1), unless the authorization is terminated  or revoked sooner.       Influenza A by PCR NEGATIVE NEGATIVE Final   Influenza B by PCR NEGATIVE NEGATIVE Final    Comment: (NOTE) The Xpert Xpress SARS-CoV-2/FLU/RSV plus assay is intended as an aid in the diagnosis of influenza from Nasopharyngeal swab specimens and should not be used as a sole basis  for treatment. Nasal washings and aspirates are unacceptable for Xpert Xpress SARS-CoV-2/FLU/RSV testing.  Fact Sheet for Patients: EntrepreneurPulse.com.au  Fact Sheet for Healthcare Providers: IncredibleEmployment.be  This test is not yet approved or cleared by the [place] FDA and has been authorized for detection and/or diagnosis of SARS-CoV-2 by FDA under an Emergency Use Authorization (EUA). This EUA will remain in effect (meaning this test can be used) for the duration of the COVID-19 declaration under Section 564(b)(1) of the Act, 21 U.S.C. section 360bbb-3(b)(1), unless the authorization is terminated or revoked.  Performed at [facility], [place] [address] [place], [place] [address]   Blood culture (routine x 2)     Status: None (Preliminary result)   Collection Time: 07/01/21  7:54 PM   Specimen: BLOOD RIGHT ARM  Result Value Ref Range Status   Specimen Description BLOOD RIGHT ARM  Final   Special Requests   Final    BOTTLES DRAWN AEROBIC AND ANAEROBIC Blood Culture results may not be optimal due to an excessive volume of blood received in culture bottles   Culture   Final    NO GROWTH 2 DAYS Performed at [facility], [place] [address], [place], [place] [address]    Report Status PENDING  Incomplete  Blood culture (routine x 2)     Status: None (Preliminary result)   Collection Time: 07/01/21  7:55 PM   Specimen: BLOOD RIGHT HAND  Result Value Ref Range Status   Specimen Description BLOOD RIGHT HAND  Final   Special Requests   Final    BOTTLES DRAWN AEROBIC ONLY Blood Culture adequate volume   Culture   Final    NO GROWTH 2 DAYS Performed at [facility], [place] [address], [place], [place] [address]    Report Status PENDING  Incomplete         Radiology Studies: DG Chest Port 1 View  Result Date: 07/02/2021 CLINICAL DATA:  Shortness of breath EXAM: PORTABLE CHEST 1 VIEW COMPARISON:  12/08/2018  FINDINGS: Lungs are clear.  No pleural effusion or pneumothorax. The heart is normal in size.  Thoracic aortic atherosclerosis. IMPRESSION: No evidence of acute cardiopulmonary disease. Electronically Signed   By: Julian Hy M.D.   On: 07/02/2021 00:17        Scheduled Meds:  albuterol  2.5 mg Nebulization  Q4H   enoxaparin (LOVENOX) injection  40 mg Subcutaneous Q24H   fluticasone furoate-vilanterol  1 puff Inhalation Daily   umeclidinium bromide  1 puff Inhalation Daily   Continuous Infusions:  ceFEPime (MAXIPIME) IV 200 mL/hr at 07/03/21 0938   vancomycin 750 mg (07/03/21 0229)     LOS: 1 day    Time spent: 34 minutes    Barb Merino, MD [facility] Pager [phone]

## 2021-07-03 NOTE — Evaluation (Signed)
Physical Therapy Evaluation Patient Details Name: Valerie Gay MRN: [id number] DOB: 09/05/1941 Today's Date: 07/03/2021   History of Present Illness  80 year old female with past medical history of COPD, chronic respiratory failure (on 3LPM via [place]), several hospitalizations for cellulitis in the past who presented to [facility] emergency department 07/01/21 with complaints of right lower extremity pain redness and swelling. +cellulitis;  Clinical Impression   Pt admitted secondary to problem above with deficits below. PTA patient was living alone and independent. She occasionally used a rollator (depending on her breathing). She has been experiencing vertigo for years and states it has worsened since coming to the hospital. She reports she cannot tolerate the "positions they needed to put me in because of my breathing" when she was referred to PT for vertigo.  Pt currently could only tolerate sitting EOB for ~1 minute due to severe rt foot pain.  Anticipate patient will benefit from PT to address problems listed below.Will continue to follow acutely to maximize functional mobility independence and safety.       Follow Up Recommendations Home health PT;Supervision/Assistance - 24 hour (if family cannot provide 24/7 or pain does not improve, may need SNF prior to return home)    Equipment Recommendations  None recommended by PT    Recommendations for Other Services OT consult     Precautions / Restrictions Precautions Precautions: Fall Precaution Comments: fell recently trying to use her foot to get something out from under sofa Restrictions Weight Bearing Restrictions: No      Mobility  Bed Mobility Overal bed mobility: Needs Assistance Bed Mobility: Sidelying to Sit;Sit to Sidelying   Sidelying to sit: Min guard;HOB elevated (with rail)     Sit to sidelying: Min guard;HOB elevated (with rail) General bed mobility comments: pt on left side and reports she  triggers her vertigo if she rolls to her right; up to EOB from her left    Transfers                 General transfer comment: unable due to foot pain  Ambulation/Gait                Stairs            Wheelchair Mobility    Modified Rankin (Stroke Patients Only)       Balance                                             Pertinent Vitals/Pain Pain Assessment: 0-10 Pain Score: 10-Worst pain ever Pain Location: rt foot Pain Descriptors / Indicators: Burning;Grimacing;Throbbing Pain Intervention(s): Limited activity within patient's tolerance;Monitored during session;Repositioned    Home Living Family/patient expects to be discharged to:: Private residence Living Arrangements: Alone Available Help at Discharge: Available PRN/intermittently Type of Home: House Home Access: Stairs to enter Entrance Stairs-Rails: Right Entrance Stairs-Number of Steps: 4 Home Layout: One level Home Equipment: Walker - 4 wheels;Bedside commode;Shower seat;Wheelchair - manual      Prior Function Level of Independence: Independent         Comments: occasional use of rollator if breathing/fatigue necessitate     Hand Dominance   Dominant Hand: Right    Extremity/Trunk Assessment   Upper Extremity Assessment Upper Extremity Assessment: Generalized weakness    Lower Extremity Assessment Lower Extremity Assessment: Generalized weakness;RLE deficits/detail RLE Deficits / Details: RLE with edema  and redness, especially in her foot    Cervical / Trunk Assessment Cervical / Trunk Assessment: Kyphotic  Communication   Communication: No difficulties  Cognition Arousal/Alertness: Awake/alert Behavior During Therapy: WFL for tasks assessed/performed Overall Cognitive Status: Within Functional Limits for tasks assessed                                        General Comments General comments (skin integrity, edema, etc.): pt  reports long h/o vertigo and did have PT for it at one time. States she could not tolerate the positioning due to her breathing status. She just avoids the movements that trigger her vertigo. States symptoms last "minutes" when she does have vertigo    Exercises     Assessment/Plan    PT Assessment Patient needs continued PT services  PT Problem List Decreased strength;Decreased activity tolerance;Decreased mobility;Decreased balance;Decreased knowledge of use of DME;Decreased safety awareness;Cardiopulmonary status limiting activity;Pain       PT Treatment Interventions DME instruction;Gait training;Stair training;Functional mobility training;Therapeutic activities;Therapeutic exercise;Balance training;Patient/family education    PT Goals (Current goals can be found in the Care Plan section)  Acute Rehab PT Goals Patient Stated Goal: for leg to get better and be able to go to her home PT Goal Formulation: With patient Time For Goal Achievement: 07/17/21 Potential to Achieve Goals: Good    Frequency Min 3X/week   Barriers to discharge Decreased caregiver support      Co-evaluation               AM-PAC PT "6 Clicks" Mobility  Outcome Measure Help needed turning from your back to your side while in a flat bed without using bedrails?: None Help needed moving from lying on your back to sitting on the side of a flat bed without using bedrails?: A Little Help needed moving to and from a bed to a chair (including a wheelchair)?: Total Help needed standing up from a chair using your arms (e.g., wheelchair or bedside chair)?: Total Help needed to walk in hospital room?: Total Help needed climbing 3-5 steps with a railing? : Total 6 Click Score: 11    End of Session Equipment Utilized During Treatment: Oxygen Activity Tolerance: Patient limited by pain Patient left: in bed;with call bell/phone within reach Nurse Communication: Mobility status PT Visit Diagnosis:  Pain;Unsteadiness on feet (R26.81);History of falling (Z91.81)    Time: 1005-1031 PT Time Calculation (min) (ACUTE ONLY): 26 min   Charges:   PT Evaluation $PT Eval Low Complexity: 1 Low PT Treatments $Therapeutic Activity: 8-22 mins         Arby Barrette, PT Pager [phone]   Rexanne Mano 07/03/2021, 10:42 AM

## 2021-07-03 NOTE — TOC Initial Note (Addendum)
Transition of Care [facility]) - Initial/Assessment Note    Patient Details  Name: Valerie Gay MRN: [id number] Date of Birth: 1941-03-11  Transition of Care [facility]) CM/SW Contact:    Joanne Chars, LCSW Phone Number: 07/03/2021, 2:16 PM  Clinical Narrative:  CSW met with pt regarding recommendation for [facility].  Pt lives alone, says her daughter [place] and son Jenny Reichmann live nearby.  Permission given to speak with them.  Discussed recommendation for 24/7 support upon discharge and pt is not sure if her children can do this and is not sure she wants to ask as they are busy.  She agreed that CSW would reach out to them and see what support they could offer.  Pt has been to SNF before and would be willing to go back if it is necessary, but would prefer HH.  Choice document given..  Current DME in home: walker, wheelchair, 3n1.  PCP in place. Pt has been vaccinated for covid with 2 boosters.    CSW reached out to daughter [place], left message.    1430: CSW received call from [place].  She is not able to provide 24/7 and does not think her brother can either.  She will contact brother to confirm, but expects will need SNF.  She would like for pt to return to [facility].                  Expected Discharge Plan: [place] Barriers to Discharge: Continued Medical Work up, Other (must enter comment) (need to see if family can provide 24/7 support)   Patient Goals and CMS Choice Patient states their goals for this hospitalization and ongoing recovery are:: "100%" CMS Medicare.gov Compare Post Acute Care list provided to:: Patient Choice offered to / list presented to : Patient  Expected Discharge Plan and Services Expected Discharge Plan: [place] Acute Care Choice:  (TBD based on ability of family to support 24/7) Living arrangements for the past 2 months: Single Family Home                                      Prior Living  Arrangements/Services Living arrangements for the past 2 months: Single Family Home Lives with:: Self Patient language and need for interpreter reviewed:: Yes Do you feel safe going back to the place where you live?: Yes      Need for Family Participation in Patient Care: Yes (Comment) Care giver support system in place?: Yes (comment) Current home services: Other (comment) (none) Criminal Activity/Legal Involvement Pertinent to Current Situation/Hospitalization: No - Comment as needed  Activities of Daily Living Home Assistive Devices/Equipment: Environmental consultant (specify type), Wheelchair (4 wheel) ADL Screening (condition at time of admission) Patient's cognitive ability adequate to safely complete daily activities?: Yes Is the patient deaf or have difficulty hearing?: No Does the patient have difficulty seeing, even when wearing glasses/contacts?: No Does the patient have difficulty concentrating, remembering, or making decisions?: No Patient able to express need for assistance with ADLs?: Yes Does the patient have difficulty dressing or bathing?: Yes Independently performs ADLs?: Yes (appropriate for developmental age) Does the patient have difficulty walking or climbing stairs?: Yes Weakness of Legs: Both Weakness of Arms/Hands: None  Permission Sought/Granted Permission sought to share information with : Family Supports Permission granted to share information with : Yes, Verbal Permission Granted  Share Information  with NAME: daughter [place], son Jenny Reichmann.           Emotional Assessment Appearance:: Appears stated age Attitude/Demeanor/Rapport: Engaged Affect (typically observed): Appropriate, Pleasant Orientation: : Oriented to Self, Oriented to Place, Oriented to  Time Alcohol / Substance Use: Not Applicable Psych Involvement: No (comment)  Admission diagnosis:  COPD exacerbation ([place]) [J44.1] Cellulitis of left lower extremity [L03.116] Cellulitis of right lower extremity  [L03.115] Patient Active Problem List   Diagnosis Date Noted   Nicotine dependence, cigarettes, uncomplicated 55/11/5866   Bilateral edema of lower extremity 01/22/2020   Edema 06/23/2019   CAD (coronary artery disease) 06/23/2019   Chronic respiratory failure with hypoxia ([place]) 01/17/2019   Cellulitis of right lower extremity 04/13/2017   GERD (gastroesophageal reflux disease) 04/13/2017   Esophageal stricture    Dysphagia    Abnormal barium swallow    History of colonic polyps    Polyp of ascending colon    Polyp of transverse colon    Acute URI 12/13/2016   Coronary artery calcification 10/23/2016   Hyperlipidemia LDL goal <100 10/23/2016   Medication management 10/23/2016   DOE (dyspnea on exertion) 10/23/2016   Lung nodule 02/26/2015   Nodule of right lung 11/28/2014   Acute calculous cholecystitis 06/22/2014   Acute cholecystitis 06/22/2014   Pre-operative respiratory examination 06/22/2014   Abnormal chest x-ray 05/28/2014   COPD Gold II D, still smoking 05/07/2014   Nicotine abuse 03/27/2014   Hypokalemia 03/27/2014   Leukocytosis 03/27/2014   TOBACCO USE 11/05/2008   COPD (chronic obstructive pulmonary disease) ([place]) 11/05/2008   HYPERGLYCEMIA 11/05/2008   ANEMIA-NOS 07/21/2007   PCP:  Kathyrn Lass, MD Pharmacy:   [facility], [place] - [address] [place] [address] Phone: [phone] Fax: [place] [place], [place] - [address] AT [facility] OF [address] & [place] [address] Phone: [phone] Fax: [phone]  [facility]  ([place]) - [place], [address] [place] [address] Phone: [phone] Fax: [phone]     Social Determinants of Health (SDOH) Interventions    Readmission Risk Interventions Readmission Risk Prevention Plan 01/24/2020  Post  Dischage Appt Not Complete  Appt Comments plan for SNF  Medication Screening Complete  Transportation Screening Complete  Some recent data might be hidden

## 2021-07-04 DIAGNOSIS — J9611 Chronic respiratory failure with hypoxia: Secondary | ICD-10-CM | POA: Diagnosis not present

## 2021-07-04 DIAGNOSIS — L03115 Cellulitis of right lower limb: Secondary | ICD-10-CM | POA: Diagnosis not present

## 2021-07-04 DIAGNOSIS — J449 Chronic obstructive pulmonary disease, unspecified: Secondary | ICD-10-CM | POA: Diagnosis not present

## 2021-07-04 DIAGNOSIS — E876 Hypokalemia: Secondary | ICD-10-CM | POA: Diagnosis not present

## 2021-07-04 NOTE — Progress Notes (Signed)
PROGRESS NOTE  Shaniyah Guertin [id number] DOB: Jan 05, 1941 DOA: 07/01/2021 PCP: Kathyrn Lass, MD  HPI/Recap of past 27 hours: 80 year old female with past medical history of COPD with chronic respiratory failure on 3 L of oxygen at home and previous cellulitis of the leg admitted on 8/16 to the hospitalist service with cellulitis of the right foot.  At that time, white blood cell count elevated at 40.  Patient started on IV antibiotics.  Assessment/Plan: Principal Problem:   Cellulitis of right lower extremity: Improving.  White blood cell count down to 22.6.  Erythema decreasing from right leg although still prominent on the right foot with swelling.  Patient complains of pain when she tries to bear any weight, but overall feels that it is improved.  Sepsis has been ruled out. Active Problems:   COPD (chronic obstructive pulmonary disease) (HCC) with chronic respiratory failure with hypoxia: Stable, on baseline 3 L nasal cannula    Nicotine abuse: Nicotine patch.    Hypokalemia: Replace as needed.  Code Status: Full code  Family Communication: Left message for family  Disposition Plan: Skilled nursing   Consultants: None  Procedures: None  Antimicrobials: IV cefepime and vancomycin 8/17-present IV Rocephin 8/17 x 1 dose  DVT prophylaxis: Lovenox  Level of care: Telemetry Medical   Objective: Vitals:   07/04/21 1200 07/04/21 1233  BP:    Pulse: (!) 112   Resp: 18 20  Temp:    SpO2: 100% 100%    Intake/Output Summary (Last 24 hours) at 07/04/2021 1547 Last data filed at 07/04/2021 1500 Gross per 24 hour  Intake 966.51 ml  Output --  Net 966.51 ml   Filed Weights   07/01/21 1923  Weight: 62 kg   Body mass index is 25 kg/m.  Exam:  General: Alert and oriented x2, no acute distress HEENT: Normocephalic, atraumatic, mucous membranes are moist Cardiovascular: Regular rate and rhythm, S1-S2 Respiratory: Decreased breath sounds  throughout Abdomen: Soft, nontender, nondistended, positive bowel sounds Musculoskeletal: No clubbing or cyanosis.  Right lower extremity with 1-2+ pitting edema from the ankle down involving the entire foot.  Significant swelling of the dorsal aspect of the foot. Skin: Erythema involving entire right foot with minimal erythema of the right leg Psychiatry: Appropriate, no evidence of psychoses Neurology: No focal deficits   Data Reviewed: CBC: Recent Labs  Lab 07/01/21 1954 07/02/21 0322 07/03/21 0111  WBC 40.1* 34.6* 22.6*  NEUTROABS 36.7* 31.0* 20.1*  HGB 10.8* 9.4* 8.2*  HCT 33.4* 29.7* 26.1*  MCV 99.1 101.7* 100.0  PLT 513* 417* [id number]*   Basic Metabolic Panel: Recent Labs  Lab 07/01/21 1954 07/02/21 0322 07/03/21 0111  NA 138 138 140  K 2.9* 3.7 3.5  CL 95* 99 102  CO2 32 29 28  GLUCOSE 105* 92 92  BUN 21 25* 28*  CREATININE 1.03* 1.09* 1.13*  CALCIUM 9.0 8.6* 8.8*  MG 2.1 2.0 2.3  PHOS  --   --  2.8   GFR: Estimated Creatinine Clearance: 33.3 mL/min (A) (by C-G formula based on SCr of 1.13 mg/dL (H)). Liver Function Tests: Recent Labs  Lab 07/01/21 1954 07/02/21 0322  AST 18 16  ALT 14 11  ALKPHOS 87 91  BILITOT 1.1 0.9  PROT 6.4* 5.4*  ALBUMIN 2.8* 2.3*   No results for input(s): LIPASE, AMYLASE in the last 168 hours. No results for input(s): AMMONIA in the last 168 hours. Coagulation Profile: No results for input(s): INR, PROTIME in the last 168 hours. Cardiac  Enzymes: No results for input(s): CKTOTAL, CKMB, CKMBINDEX, TROPONINI in the last 168 hours. BNP (last 3 results) No results for input(s): PROBNP in the last 8760 hours. HbA1C: No results for input(s): HGBA1C in the last 72 hours. CBG: No results for input(s): GLUCAP in the last 168 hours. Lipid Profile: No results for input(s): CHOL, HDL, LDLCALC, TRIG, CHOLHDL, LDLDIRECT in the last 72 hours. Thyroid Function Tests: No results for input(s): TSH, T4TOTAL, FREET4, T3FREE, THYROIDAB in the  last 72 hours. Anemia Panel: No results for input(s): VITAMINB12, FOLATE, FERRITIN, TIBC, IRON, RETICCTPCT in the last 72 hours. Urine analysis:    Component Value Date/Time   COLORURINE YELLOW 04/13/2017 1215   APPEARANCEUR HAZY (A) 04/13/2017 1215   LABSPEC 1.025 04/13/2017 1215   PHURINE 5.0 04/13/2017 1215   GLUCOSEU NEGATIVE 04/13/2017 1215   HGBUR SMALL (A) 04/13/2017 1215   BILIRUBINUR NEGATIVE 04/13/2017 1215   KETONESUR 20 (A) 04/13/2017 1215   PROTEINUR 30 (A) 04/13/2017 1215   UROBILINOGEN 1.0 03/29/2014 1032   NITRITE NEGATIVE 04/13/2017 1215   LEUKOCYTESUR NEGATIVE 04/13/2017 1215   Sepsis Labs: '@LABRCNTIP'$ (procalcitonin:4,lacticidven:4)  ) Recent Results (from the past 240 hour(s))  Resp Panel by RT-PCR (Flu A&B, Covid) Nasopharyngeal Swab     Status: None   Collection Time: 07/01/21  7:35 PM   Specimen: Nasopharyngeal Swab; Nasopharyngeal(NP) swabs in vial transport medium  Result Value Ref Range Status   SARS Coronavirus 2 by RT PCR NEGATIVE NEGATIVE Final    Comment: (NOTE) SARS-CoV-2 target nucleic acids are NOT DETECTED.  The SARS-CoV-2 RNA is generally detectable in upper respiratory specimens during the acute phase of infection. The lowest concentration of SARS-CoV-2 viral copies this assay can detect is 138 copies/mL. A negative result does not preclude SARS-Cov-2 infection and should not be used as the sole basis for treatment or other patient management decisions. A negative result may occur with  improper specimen collection/handling, submission of specimen other than nasopharyngeal swab, presence of viral mutation(s) within the areas targeted by this assay, and inadequate number of viral copies(<138 copies/mL). A negative result must be combined with clinical observations, patient history, and epidemiological information. The expected result is Negative.  Fact Sheet for Patients:  EntrepreneurPulse.com.au  Fact Sheet for  Healthcare Providers:  IncredibleEmployment.be  This test is no t yet approved or cleared by the [place] FDA and  has been authorized for detection and/or diagnosis of SARS-CoV-2 by FDA under an Emergency Use Authorization (EUA). This EUA will remain  in effect (meaning this test can be used) for the duration of the COVID-19 declaration under Section 564(b)(1) of the Act, 21 U.S.C.section 360bbb-3(b)(1), unless the authorization is terminated  or revoked sooner.       Influenza A by PCR NEGATIVE NEGATIVE Final   Influenza B by PCR NEGATIVE NEGATIVE Final    Comment: (NOTE) The Xpert Xpress SARS-CoV-2/FLU/RSV plus assay is intended as an aid in the diagnosis of influenza from Nasopharyngeal swab specimens and should not be used as a sole basis for treatment. Nasal washings and aspirates are unacceptable for Xpert Xpress SARS-CoV-2/FLU/RSV testing.  Fact Sheet for Patients: EntrepreneurPulse.com.au  Fact Sheet for Healthcare Providers: IncredibleEmployment.be  This test is not yet approved or cleared by the [place] FDA and has been authorized for detection and/or diagnosis of SARS-CoV-2 by FDA under an Emergency Use Authorization (EUA). This EUA will remain in effect (meaning this test can be used) for the duration of the COVID-19 declaration under Section 564(b)(1) of the Act,  21 U.S.C. section 360bbb-3(b)(1), unless the authorization is terminated or revoked.  Performed at [facility], [place] [address] [place], [place] [address]   Blood culture (routine x 2)     Status: None (Preliminary result)   Collection Time: 07/01/21  7:54 PM   Specimen: BLOOD RIGHT ARM  Result Value Ref Range Status   Specimen Description BLOOD RIGHT ARM  Final   Special Requests   Final    BOTTLES DRAWN AEROBIC AND ANAEROBIC Blood Culture results may not be optimal due to an excessive volume of blood received in culture bottles    Culture   Final    NO GROWTH 3 DAYS Performed at [facility], [place] [address] [place], [place] [address]    Report Status PENDING  Incomplete  Blood culture (routine x 2)     Status: None (Preliminary result)   Collection Time: 07/01/21  7:55 PM   Specimen: BLOOD RIGHT HAND  Result Value Ref Range Status   Specimen Description BLOOD RIGHT HAND  Final   Special Requests   Final    BOTTLES DRAWN AEROBIC ONLY Blood Culture adequate volume   Culture   Final    NO GROWTH 3 DAYS Performed at [facility], [place] [address] [place], [place] [address]    Report Status PENDING  Incomplete      Studies: No results found.  Scheduled Meds:  albuterol  2.5 mg Nebulization Q4H   enoxaparin (LOVENOX) injection  40 mg Subcutaneous Q24H   fluticasone furoate-vilanterol  1 puff Inhalation Daily   umeclidinium bromide  1 puff Inhalation Daily    Continuous Infusions:  ceFEPime (MAXIPIME) IV 200 mL/hr at 07/04/21 0926   vancomycin 750 mg (07/04/21 0455)     LOS: 2 days     Annita Brod, MD [facility]   07/04/2021, 3:47 PM

## 2021-07-04 NOTE — TOC Progression Note (Addendum)
Transition of Care (TOC) - Progression Note    Patient Details  Name: Valerie Gay MRN: [id number] Date of Birth: 11/30/1940  Transition of Care (TOC) CM/SW Contact  Cyrus P Kolar, LCSW Phone Number: 07/04/2021, 9:33 AM  Clinical Narrative:     CSW met with pt to discuss disposition. CSW explained that per CSW Greg's note that pt's daughter and son likely unable to provide 24/7 supervision and that daughter thinks she needs SNF. Pt is agreeable to SNF workup. Has been to [facility] in the past and states her daughter likes it because it is close to her home. Pt reports she is covid vaccinated and has 2 boosters. CSW completed FL2 and faxed bed requests in hub.   CSW called pt daughter; left message. She called back. CSW provided bed offers; daughter is wanting [facility]. CSW will follow up with pt.   Called [facility]; they should have a bed Monday 8/22. Will ned covid test day prior to DC.  Expected Discharge Plan: Home w Home Health Services Barriers to Discharge: Continued Medical Work up, Other (must enter comment) (need to see if family can provide 24/7 support)  Expected Discharge Plan and Services Expected Discharge Plan: Home w Home Health Services     Post Acute Care Choice:  (TBD based on ability of family to support 24/7) Living arrangements for the past 2 months: Single Family Home                                       Social Determinants of Health (SDOH) Interventions    Readmission Risk Interventions Readmission Risk Prevention Plan 01/24/2020  Post Dischage Appt Not Complete  Appt Comments plan for SNF  Medication Screening Complete  Transportation Screening Complete  Some recent data might be hidden    

## 2021-07-04 NOTE — Plan of Care (Signed)

## 2021-07-04 NOTE — Care Management Important Message (Signed)
Important Message  Patient Details  Name: Valerie Gay MRN: [id number] Date of Birth: [birth year]   Medicare Important Message Given:  Yes     Arabia Nylund [address] Circle 07/04/2021, 3:07 PM

## 2021-07-04 NOTE — Progress Notes (Signed)
   07/03/21 2221  Assess: MEWS Score  Temp 98.2 F (36.8 C)  BP 122/63  Pulse Rate (!) 116  Resp (!) 21  Level of Consciousness Alert  SpO2 99 %  O2 Device Nasal Cannula  O2 Flow Rate (L/min) 3 L/min  Assess: MEWS Score  MEWS Temp 0  MEWS Systolic 0  MEWS Pulse 2  MEWS RR 1  MEWS LOC 0  MEWS Score 3  MEWS Score Color Yellow  Assess: if the MEWS score is Yellow or Red  Were vital signs taken at a resting state? Yes  Focused Assessment No change from prior assessment  Early Detection of Sepsis Score *See Row Information* Medium  MEWS guidelines implemented *See Row Information* No, previously yellow, continue vital signs every 4 hours  Treat  MEWS Interventions Administered scheduled meds/treatments  Pain Scale 0-10  Pain Score 5  Pain Type Acute pain  Pain Location Foot  Pain Orientation Right  Pain Descriptors / Indicators Tender  Pain Frequency Intermittent  Pain Onset With Activity  Patients Stated Pain Goal 0  Pain Intervention(s) Emotional support  Take Vital Signs  Increase Vital Sign Frequency  Yellow: Q 2hr X 2 then Q 4hr X 2, if remains yellow, continue Q 4hrs  Escalate  MEWS: Escalate Yellow: discuss with charge nurse/RN and consider discussing with provider and RRT  Notify: Charge Nurse/RN  Name of Charge Nurse/RN Notified Ann  Date Charge Nurse/RN Notified 07/03/21  Time Charge Nurse/RN Notified 2230  Document  Patient Outcome Stabilized after interventions  Progress note created (see row info) Yes  Patient is in the yellow MEWS for the same reason as previous. Patient asymptotic and on tele monitor, will continue to support and monitor patient

## 2021-07-04 NOTE — Progress Notes (Signed)
Physical Therapy Treatment Patient Details Name: Valerie Gay MRN: [id number] DOB: January 27, 1941 Today's Date: 07/04/2021    History of Present Illness 80 year old female with past medical history of COPD, chronic respiratory failure (on 3LPM via [place]), several hospitalizations for cellulitis in the past who presented to [facility] emergency department 07/01/21 with complaints of right lower extremity pain redness and swelling. +cellulitis;    PT Comments    Patient not willing to try EOB today due to L foot throbbing with dependency though hasn't tried since yesterday.  Attempted to have RN medication prior to mobility, but she refused medication.  She demonstrated bbed exercises she has done in the past.  Also discussed more upright positioning due to lung issues.  Placed up in chair position.  Patient will need SNF level rehab at d/c.  She is aware and agreeable.    Follow Up Recommendations  SNF     Equipment Recommendations  None recommended by PT    Recommendations for Other Services       Precautions / Restrictions Precautions Precautions: Fall Precaution Comments: fell recently trying to use her foot to get something out from under sofa    Mobility  Bed Mobility Overal bed mobility: Needs Assistance Bed Mobility: Supine to Sit     Supine to sit: HOB elevated;Supervision (to long sitting using rails)     General bed mobility comments: refused to sit on EOB due to makes L foot throb, attempted to have pt medicated, but she refused meds when RN offerred.  Assist to scoot up in bed with HOB up and placed bed in chair position to improve upright posture, initially pt leaning to L, then adjusted with min A for center bed positioning    Transfers                    Ambulation/Gait                 Stairs             Wheelchair Mobility    Modified Rankin (Stroke Patients Only)       Balance                                             Cognition Arousal/Alertness: Awake/alert Behavior During Therapy: WFL for tasks assessed/performed Overall Cognitive Status: Within Functional Limits for tasks assessed                                        Exercises General Exercises - Upper Extremity Shoulder Flexion: AROM;10 reps;Right;Supine (with ant/post shoulder circles) General Exercises - Lower Extremity Ankle Circles/Pumps: AROM;Both;10 reps;Supine Heel Slides: AROM;Both;10 reps;Supine    General Comments        Pertinent Vitals/Pain Pain Assessment: Faces Faces Pain Scale: Hurts even more Pain Location: rt foot Pain Descriptors / Indicators: Burning;Grimacing;Throbbing Pain Intervention(s): Limited activity within patient's tolerance;Monitored during session (attempted to get pre-med, but pt refusing)    Home Living                      Prior Function            PT Goals (current goals can now be found in the care plan section) Progress towards PT goals:  Not progressing toward goals - comment (pain and self limited)    Frequency    Min 2X/week      PT Plan Discharge plan needs to be updated;Frequency needs to be updated    Co-evaluation              AM-PAC PT "6 Clicks" Mobility   Outcome Measure  Help needed turning from your back to your side while in a flat bed without using bedrails?: A Little Help needed moving from lying on your back to sitting on the side of a flat bed without using bedrails?: A Little Help needed moving to and from a bed to a chair (including a wheelchair)?: Total Help needed standing up from a chair using your arms (e.g., wheelchair or bedside chair)?: Total Help needed to walk in hospital room?: Total Help needed climbing 3-5 steps with a railing? : Total 6 Click Score: 10    End of Session   Activity Tolerance: Patient limited by pain Patient left: in bed;with call bell/phone within reach   PT Visit  Diagnosis: Pain;Unsteadiness on feet (R26.81);History of falling (Z91.81) Pain - Right/Left: Left Pain - part of body: Ankle and joints of foot     Time: [id number] PT Time Calculation (min) (ACUTE ONLY): 19 min  Charges:  $Therapeutic Activity: 8-22 mins                     Magda Kiel, PT Acute Rehabilitation Services [id number] Office:[phone] 07/04/2021    Valerie Gay 07/04/2021, 12:13 PM

## 2021-07-05 ENCOUNTER — Inpatient Hospital Stay (HOSPITAL_COMMUNITY): Payer: Medicare Other

## 2021-07-05 DIAGNOSIS — L03115 Cellulitis of right lower limb: Principal | ICD-10-CM

## 2021-07-05 LAB — BASIC METABOLIC PANEL
Anion gap: 6 (ref 5–15)
BUN: 29 mg/dL — ABNORMAL HIGH (ref 8–23)
CO2: 28 mmol/L (ref 22–32)
Calcium: 8.7 mg/dL — ABNORMAL LOW (ref 8.9–10.3)
Chloride: 105 mmol/L (ref 98–111)
Creatinine, Ser: 0.81 mg/dL (ref 0.44–1.00)
GFR, Estimated: >60 mL/min (ref >60)
Glucose, Bld: 113 mg/dL — ABNORMAL HIGH (ref 70–99)
Potassium: 3.3 mmol/L — ABNORMAL LOW (ref 3.5–5.1)
Sodium: 139 mmol/L (ref 135–145)

## 2021-07-05 LAB — CBC
HCT: 26.3 % — ABNORMAL LOW (ref 36.0–46.0)
Hemoglobin: 8.5 g/dL — ABNORMAL LOW (ref 12.0–15.0)
MCH: 32 pg (ref 26.0–34.0)
MCHC: 32.3 g/dL (ref 30.0–36.0)
MCV: 98.9 fL (ref 80.0–100.0)
Platelets: 466 10*3/uL — ABNORMAL HIGH (ref 150–400)
RBC: 2.66 MIL/uL — ABNORMAL LOW (ref 3.87–5.11)
RDW: 15 % (ref 11.5–15.5)
WBC: 13.2 10*3/uL — ABNORMAL HIGH (ref 4.0–10.5)
nRBC: 0 % (ref 0.0–0.2)

## 2021-07-05 MED ORDER — DOXYCYCLINE HYCLATE 100 MG PO TABS
100.0000 mg | ORAL_TABLET | Freq: Two times a day (BID) | ORAL | Status: DC
  Administered 2021-07-05 – 2021-07-09 (×9): 100 mg via ORAL
  Filled 2021-07-05 (×9): qty 1

## 2021-07-05 MED ORDER — ALBUTEROL SULFATE (2.5 MG/3ML) 0.083% IN NEBU
2.5000 mg | INHALATION_SOLUTION | RESPIRATORY_TRACT | Status: DC
  Administered 2021-07-05: 2.5 mg via RESPIRATORY_TRACT
  Filled 2021-07-05 (×3): qty 3

## 2021-07-05 MED ORDER — PREDNISONE 20 MG PO TABS
40.0000 mg | ORAL_TABLET | Freq: Every day | ORAL | Status: DC
  Administered 2021-07-06 – 2021-07-07 (×2): 40 mg via ORAL
  Filled 2021-07-05 (×2): qty 2

## 2021-07-05 MED ORDER — POTASSIUM CHLORIDE CRYS ER 20 MEQ PO TBCR
40.0000 meq | EXTENDED_RELEASE_TABLET | Freq: Once | ORAL | Status: AC
  Administered 2021-07-05: 40 meq via ORAL
  Filled 2021-07-05: qty 2

## 2021-07-05 NOTE — Progress Notes (Signed)
[facility] PROGRESS NOTE    Valerie Gay  [id number] DOB: March 05, 1941 DOA: 07/01/2021 PCP: Kathyrn Lass, MD      Brief Narrative:  [name] is an 80 y.o. F with COPD, chronic respiratory failure on home O2 who presented with cellulitis and swelling of the right foot.         Assessment & Plan:  Cellulitis and possible abscess of the right lower extremity Presented with WBC greater than 40, severe erythema in the right leg, and swelling.  Swelling is much improved, and her pain is improving, but there now appears to be a fluid collection on the dorsum of the right foot. - Stop cefepime - Transition vancomycin to doxycycline - Ultrasound of the right foot to rule out fluid collection which may need to be drained   ADDENDUM: US shows complex cysts, possible phlegmon vs abscess.   - Will consult Orthopedics in AM    COPD exacerbation Moderate to severe COPD, FEV1 <50% predicted Chronic respiratory failure Wheezing is worse today, she is somewhat short of breath, vey wheey on exam -Continue ICS/LABA/LAMA - Start prednisone burst - Schedule albuterol    Tachycardia Not Afib.  Clearly defined P waves.  May be sinus tachycardia, but the gradual start, the rates in the 120s and the multiple p wave morphologies in this COPD patient suggest MAT - Treat COPD flare  Smoking - Continue nicotine patch  Hypokalemia Replete   Anemia Thrombocytosis Leukocytosis Likely platelets and WBC reactive.  Anemia may be chronic.   - Follow up with PCP after resolution of infection       Disposition: Status is: Inpatient  Remains inpatient appropriate because:IV treatments appropriate due to intensity of illness or inability to take PO  Dispo: The patient is from: Home              Anticipated d/c is to: SNF              Patient currently is not medically stable to d/c.   Difficult to place patient No       Level of care: Telemetry  Medical       MDM: The below labs and imaging reports were reviewed and summarized above.  Medication management as above.   DVT prophylaxis: enoxaparin (LOVENOX) injection 40 mg Start: 07/02/21 1000  Code Status: FULL Code              Subjective: She is having some more shortness of breath today, still some pain in her right foot.  No fever, confusion.  No sputum.  No palpitations or chest pain.  Objective: Vitals:   07/05/21 0736 07/05/21 0739 07/05/21 0800 07/05/21 1328  BP:   128/67 (!) 119/59  Pulse:   (!) 135 (!) 111  Resp:      Temp:   98.3 F (36.8 C) 97.7 F (36.5 C)  TempSrc:   Oral Oral  SpO2: 96% 96%  100%  Weight:      Height:       No intake or output data in the 24 hours ending 07/05/21 1728 Filed Weights   07/01/21 1923  Weight: 62 kg    Examination: General appearance: Elderly adult female, alert and in no acute distress.  Appears tired and very weak HEENT: Anicteric, conjunctiva pink, lids and lashes normal. No nasal deformity, discharge, epistaxis.  Lips moist, edentulous, oropharynx tacky dry, no oral lesions, hearing normal.   Skin: Warm and dry.  no jaundice.  No  suspicious rashes or lesions. Cardiac: Tachycardic, regular, nl [id number], systolic murmurs appreciated.  No LE edema.  Radial  pulses 2+ and symmetric. Respiratory: Normal respiratory rate and rhythm.  Wheezing bilaterally with inspiration and expiration, no rales Abdomen: Abdomen soft.  no TTP. No ascites, distension, hepatosplenomegaly.   MSK: No deformities or effusions.  Right foot redness almost completely resolved, but there is a golf ball sized area of fluctuance on the dorsum very tender s Neuro: Awake and alert.  EOMI, moves all extremities with generalized weakness. Speech fluent.    Psych: Sensorium intact and responding to questions, attention normal. Affect normal.  Judgment and insight appear normal.    Data Reviewed: I have personally reviewed following labs and  imaging studies:  CBC: Recent Labs  Lab 07/01/21 1954 07/02/21 0322 07/03/21 0111 07/05/21 0135  WBC 40.1* 34.6* 22.6* 13.2*  NEUTROABS 36.7* 31.0* 20.1*  --   HGB 10.8* 9.4* 8.2* 8.5*  HCT 33.4* 29.7* 26.1* 26.3*  MCV 99.1 101.7* 100.0 98.9  PLT 513* 417* 429* [id number]*   Basic Metabolic Panel: Recent Labs  Lab 07/01/21 1954 07/02/21 0322 07/03/21 0111 07/05/21 0135  NA 138 138 140 139  K 2.9* 3.7 3.5 3.3*  CL 95* 99 102 105  CO2 32 '29 28 28  '$ GLUCOSE 105* 92 92 113*  BUN 21 25* 28* 29*  CREATININE 1.03* 1.09* 1.13* 0.81  CALCIUM 9.0 8.6* 8.8* 8.7*  MG 2.1 2.0 2.3  --   PHOS  --   --  2.8  --    GFR: Estimated Creatinine Clearance: 46.4 mL/min (by C-G formula based on SCr of 0.81 mg/dL). Liver Function Tests: Recent Labs  Lab 07/01/21 1954 07/02/21 0322  AST 18 16  ALT 14 11  ALKPHOS 87 91  BILITOT 1.1 0.9  PROT 6.4* 5.4*  ALBUMIN 2.8* 2.3*   No results for input(s): LIPASE, AMYLASE in the last 168 hours. No results for input(s): AMMONIA in the last 168 hours. Coagulation Profile: No results for input(s): INR, PROTIME in the last 168 hours. Cardiac Enzymes: No results for input(s): CKTOTAL, CKMB, CKMBINDEX, TROPONINI in the last 168 hours. BNP (last 3 results) No results for input(s): PROBNP in the last 8760 hours. HbA1C: No results for input(s): HGBA1C in the last 72 hours. CBG: No results for input(s): GLUCAP in the last 168 hours. Lipid Profile: No results for input(s): CHOL, HDL, LDLCALC, TRIG, CHOLHDL, LDLDIRECT in the last 72 hours. Thyroid Function Tests: No results for input(s): TSH, T4TOTAL, FREET4, T3FREE, THYROIDAB in the last 72 hours. Anemia Panel: No results for input(s): VITAMINB12, FOLATE, FERRITIN, TIBC, IRON, RETICCTPCT in the last 72 hours. Urine analysis:    Component Value Date/Time   COLORURINE YELLOW 04/13/2017 1215   APPEARANCEUR HAZY (A) 04/13/2017 1215   LABSPEC 1.025 04/13/2017 1215   PHURINE 5.0 04/13/2017 1215   GLUCOSEU  NEGATIVE 04/13/2017 1215   HGBUR SMALL (A) 04/13/2017 1215   BILIRUBINUR NEGATIVE 04/13/2017 1215   KETONESUR 20 (A) 04/13/2017 1215   PROTEINUR 30 (A) 04/13/2017 1215   UROBILINOGEN 1.0 03/29/2014 1032   NITRITE NEGATIVE 04/13/2017 1215   LEUKOCYTESUR NEGATIVE 04/13/2017 1215   Sepsis Labs: '@LABRCNTIP'$ (procalcitonin:4,lacticacidven:4)  ) Recent Results (from the past 240 hour(s))  Resp Panel by RT-PCR (Flu A&B, Covid) Nasopharyngeal Swab     Status: None   Collection Time: 07/01/21  7:35 PM   Specimen: Nasopharyngeal Swab; Nasopharyngeal(NP) swabs in vial transport medium  Result Value Ref Range Status   SARS Coronavirus 2 by  RT PCR NEGATIVE NEGATIVE Final    Comment: (NOTE) SARS-CoV-2 target nucleic acids are NOT DETECTED.  The SARS-CoV-2 RNA is generally detectable in upper respiratory specimens during the acute phase of infection. The lowest concentration of SARS-CoV-2 viral copies this assay can detect is 138 copies/mL. A negative result does not preclude SARS-Cov-2 infection and should not be used as the sole basis for treatment or other patient management decisions. A negative result may occur with  improper specimen collection/handling, submission of specimen other than nasopharyngeal swab, presence of viral mutation(s) within the areas targeted by this assay, and inadequate number of viral copies(<138 copies/mL). A negative result must be combined with clinical observations, patient history, and epidemiological information. The expected result is Negative.  Fact Sheet for Patients:  EntrepreneurPulse.com.au  Fact Sheet for Healthcare Providers:  IncredibleEmployment.be  This test is no t yet approved or cleared by the [place] FDA and  has been authorized for detection and/or diagnosis of SARS-CoV-2 by FDA under an Emergency Use Authorization (EUA). This EUA will remain  in effect (meaning this test can be used) for the  duration of the COVID-19 declaration under Section 564(b)(1) of the Act, 21 U.S.C.section 360bbb-3(b)(1), unless the authorization is terminated  or revoked sooner.       Influenza A by PCR NEGATIVE NEGATIVE Final   Influenza B by PCR NEGATIVE NEGATIVE Final    Comment: (NOTE) The Xpert Xpress SARS-CoV-2/FLU/RSV plus assay is intended as an aid in the diagnosis of influenza from Nasopharyngeal swab specimens and should not be used as a sole basis for treatment. Nasal washings and aspirates are unacceptable for Xpert Xpress SARS-CoV-2/FLU/RSV testing.  Fact Sheet for Patients: EntrepreneurPulse.com.au  Fact Sheet for Healthcare Providers: IncredibleEmployment.be  This test is not yet approved or cleared by the [place] FDA and has been authorized for detection and/or diagnosis of SARS-CoV-2 by FDA under an Emergency Use Authorization (EUA). This EUA will remain in effect (meaning this test can be used) for the duration of the COVID-19 declaration under Section 564(b)(1) of the Act, 21 U.S.C. section 360bbb-3(b)(1), unless the authorization is terminated or revoked.  Performed at [facility], [place] [address], [place], [place] [address]   Blood culture (routine x 2)     Status: None (Preliminary result)   Collection Time: 07/01/21  7:54 PM   Specimen: BLOOD RIGHT ARM  Result Value Ref Range Status   Specimen Description BLOOD RIGHT ARM  Final   Special Requests   Final    BOTTLES DRAWN AEROBIC AND ANAEROBIC Blood Culture results may not be optimal due to an excessive volume of blood received in culture bottles   Culture   Final    NO GROWTH 4 DAYS Performed at [facility], [place] [address], [place], [place] [address]    Report Status PENDING  Incomplete  Blood culture (routine x 2)     Status: None (Preliminary result)   Collection Time: 07/01/21  7:55 PM   Specimen: BLOOD RIGHT HAND  Result Value Ref Range Status    Specimen Description BLOOD RIGHT HAND  Final   Special Requests   Final    BOTTLES DRAWN AEROBIC ONLY Blood Culture adequate volume   Culture   Final    NO GROWTH 4 DAYS Performed at [facility], [place] [address] [place], [place] [address]    Report Status PENDING  Incomplete         Radiology Studies: [place] RT LOWER EXTREM LTD  SOFT TISSUE NON VASCULAR  Result Date: 07/05/2021 CLINICAL DATA:  Right foot swelling, eval for fluid EXAM: ULTRASOUND right LOWER EXTREMITY LIMITED TECHNIQUE: Ultrasound examination of the lower extremity soft tissues was performed in the area of clinical concern. COMPARISON:  None. FINDINGS: Within the area of concern along the anterior right foot, there are multiple areas of complex fluid, largest visible pocket measuring approximately 1.4 x 0.8 cm and 2.2 x 0.7 cm. There is generalized subcutaneous edema and increased vascularity. IMPRESSION: Subcutaneous edema and increased vascularity as can be seen in cellulitis. Complex collections of fluid measuring up to 1.4 x 0.8 cm and 2.2 x 0.7 cm are seen. These could represent areas of localized lymphedema or phlegmon/developing abscesses. MRI with and without contrast may be useful for further characterization. Electronically Signed   By: Maurine Simmering M.D.   On: 07/05/2021 12:28        Scheduled Meds:  albuterol  2.5 mg Nebulization Q4H WA   doxycycline  100 mg Oral Q12H   enoxaparin (LOVENOX) injection  40 mg Subcutaneous Q24H   fluticasone furoate-vilanterol  1 puff Inhalation Daily   [START ON 07/06/2021] predniSONE  40 mg Oral Q breakfast   umeclidinium bromide  1 puff Inhalation Daily   Continuous Infusions:   LOS: 3 days    Time spent: 35 minutes    Edwin Dada, MD [facility] 07/05/2021, 5:28 PM     Please page though [place] or Epic secure chat:  For [employer], Adult nurse

## 2021-07-05 NOTE — Plan of Care (Signed)

## 2021-07-06 DIAGNOSIS — L03115 Cellulitis of right lower limb: Secondary | ICD-10-CM | POA: Diagnosis not present

## 2021-07-06 LAB — CULTURE, BLOOD (ROUTINE X 2)
Culture: NO GROWTH
Culture: NO GROWTH
Special Requests: ADEQUATE

## 2021-07-06 LAB — CBC
HCT: 25.8 % — ABNORMAL LOW (ref 36.0–46.0)
Hemoglobin: 7.9 g/dL — ABNORMAL LOW (ref 12.0–15.0)
MCH: 31.1 pg (ref 26.0–34.0)
MCHC: 30.6 g/dL (ref 30.0–36.0)
MCV: 101.6 fL — ABNORMAL HIGH (ref 80.0–100.0)
Platelets: 505 10*3/uL — ABNORMAL HIGH (ref 150–400)
RBC: 2.54 MIL/uL — ABNORMAL LOW (ref 3.87–5.11)
RDW: 15.3 % (ref 11.5–15.5)
WBC: 12.1 10*3/uL — ABNORMAL HIGH (ref 4.0–10.5)
nRBC: 0 % (ref 0.0–0.2)

## 2021-07-06 MED ORDER — LEVALBUTEROL HCL 1.25 MG/0.5ML IN NEBU
1.2500 mg | INHALATION_SOLUTION | Freq: Four times a day (QID) | RESPIRATORY_TRACT | Status: DC
  Administered 2021-07-06 – 2021-07-09 (×5): 1.25 mg via RESPIRATORY_TRACT
  Filled 2021-07-06 (×7): qty 0.5

## 2021-07-06 MED ORDER — ENOXAPARIN SODIUM 40 MG/0.4ML IJ SOSY
40.0000 mg | PREFILLED_SYRINGE | INTRAMUSCULAR | Status: DC
  Administered 2021-07-07 – 2021-07-09 (×3): 40 mg via SUBCUTANEOUS
  Filled 2021-07-06 (×3): qty 0.4

## 2021-07-06 MED ORDER — LEVALBUTEROL HCL 1.25 MG/0.5ML IN NEBU
1.2500 mg | INHALATION_SOLUTION | Freq: Four times a day (QID) | RESPIRATORY_TRACT | Status: DC
  Administered 2021-07-06: 1.25 mg via RESPIRATORY_TRACT

## 2021-07-06 NOTE — Progress Notes (Signed)
RT NOTES: Pt's breath sounds very diminished with expiratory wheezes. Patient refuses albuterol neb treatment, says they make her sick and make her heart race.

## 2021-07-06 NOTE — Progress Notes (Signed)
PROGRESS NOTE    Valerie Gay  [id number] DOB: May 29, 1941 DOA: 07/01/2021 PCP: Kathyrn Lass, MD    Brief Narrative:  Patient with history of COPD, chronic hypoxemic respiratory failure on 3 L oxygen at home, previous cellulitis of the leg presented to the emergency department with complaints of right lower extremity pain redness and swelling for 2 days.  In the emergency room, hemodynamically stable.  Leukocytosis with WBC count 40,000.  Started on IV antibiotics and admitted to the hospital. Cellulitis significantly improved with localized collection on the right foot. Significant bronchospasm today.   Assessment & Plan:   Principal Problem:   Cellulitis of right lower extremity Active Problems:   COPD (chronic obstructive pulmonary disease) (HCC)   Nicotine abuse   Hypokalemia   Chronic respiratory failure with hypoxia (HCC)   Nicotine dependence, cigarettes, uncomplicated  Cellulitis of the right lower extremity: Admitted with severe extending cellulitis of right leg.  Clinically improving.  Treated with vancomycin and cefepime.  WBC normalizing. On doxycycline now. Fluctuant swelling dorsum of the right foot.  Ultrasound consistent with complex fluid. Discussed with orthopedics whether this will need to be drained. Will treat with 10 days of total antibiotics.  If surgical drain needed, culture will be appreciated.  COPD with chronic hypoxemic respiratory failure: Now with exacerbation. Patient does have severe COPD.  Using 3 L oxygen that she uses at home. Patient is on Dulera that she will continue.   Started on prednisone 50 mg daily, will continue for 5 days. Aggressive bronchodilator therapy.  Is started on Xopenex as patient is getting tachypneic with albuterol.  Hypokalemia: Replaced and adequate.     DVT prophylaxis: enoxaparin (LOVENOX) injection 40 mg Start: 07/02/21 1000   Code Status: Full code Family Communication: We will call. Disposition  Plan: Status is: Inpatient  Remains inpatient appropriate because:Inpatient level of care appropriate due to severity of illness  Dispo: The patient is from: Home              Anticipated d/c is to: Home with home health.              Patient currently is not medically stable to d/c.   Difficult to place patient No         Consultants:  Orthopedics  Procedures:  None   Antimicrobials:  Antibiotics Given (last 72 hours)     Date/Time Action Medication Dose Rate   07/03/21 0938 New Bag/Given   ceFEPIme (MAXIPIME) 2 g in sodium chloride 0.9 % 100 mL IVPB  200 mL/hr   07/03/21 2147 New Bag/Given   ceFEPIme (MAXIPIME) 2 g in sodium chloride 0.9 % 100 mL IVPB 2 g 200 mL/hr   07/04/21 0455 New Bag/Given   vancomycin (VANCOREADY) IVPB 750 mg/150 mL 750 mg 150 mL/hr   07/04/21 2057 New Bag/Given   ceFEPIme (MAXIPIME) 2 g in sodium chloride 0.9 % 100 mL IVPB 2 g 200 mL/hr   07/05/21 0148 New Bag/Given   vancomycin (VANCOREADY) IVPB 750 mg/150 mL 750 mg 150 mL/hr   07/05/21 0920 New Bag/Given   ceFEPIme (MAXIPIME) 2 g in sodium chloride 0.9 % 100 mL IVPB 2 g 200 mL/hr   07/05/21 1729 Given   doxycycline (VIBRA-TABS) tablet 100 mg 100 mg    07/05/21 2055 Given   doxycycline (VIBRA-TABS) tablet 100 mg 100 mg           Subjective: Patient seen and examined.  Mild to moderate pain in the right foot otherwise  most of the symptoms improved.  She is more worried about her lungs.  She is wondering why she is not getting better.  Breathing treatments giving her palpitations.  Objective: Vitals:   07/05/21 1328 07/05/21 2114 07/06/21 0407 07/06/21 0735  BP: (!) 119/59 128/62 (!) 104/58   Pulse: (!) 111 (!) 110 (!) 102   Resp:  20    Temp: 97.7 F (36.5 C) 98.1 F (36.7 C) 98.1 F (36.7 C)   TempSrc: Oral Oral Oral   SpO2: 100% 100% 100% 97%  Weight:      Height:        Intake/Output Summary (Last 24 hours) at 07/06/2021 0819 Last data filed at 07/06/2021 0548 Gross per 24  hour  Intake --  Output 800 ml  Net -800 ml   Filed Weights   07/01/21 1923  Weight: 62 kg    Examination:  General: Looks fairly comfortable and currently on 3 L oxygen.  Not in any distress.  Able to communicate in full sentences. Cardiovascular: S1-S2 normal.  Tachycardic. Respiratory: Bilateral poor air entry.  No added sounds due to poor air entry. Gastrointestinal: Soft and nontender.  Bowel sounds present. Ext: Right leg with almost normal color.  There is a fluctuant mass on the dorsum of the right foot about 5 x 5 cm in size. Neuro: Equal in all extremities.    Data Reviewed: I have personally reviewed following labs and imaging studies  CBC: Recent Labs  Lab 07/01/21 1954 07/02/21 0322 07/03/21 0111 07/05/21 0135 07/06/21 0304  WBC 40.1* 34.6* 22.6* 13.2* 12.1*  NEUTROABS 36.7* 31.0* 20.1*  --   --   HGB 10.8* 9.4* 8.2* 8.5* 7.9*  HCT 33.4* 29.7* 26.1* 26.3* 25.8*  MCV 99.1 101.7* 100.0 98.9 101.6*  PLT 513* 417* 429* 466* [id number]*   Basic Metabolic Panel: Recent Labs  Lab 07/01/21 1954 07/02/21 0322 07/03/21 0111 07/05/21 0135  NA 138 138 140 139  K 2.9* 3.7 3.5 3.3*  CL 95* 99 102 105  CO2 32 '29 28 28  '$ GLUCOSE 105* 92 92 113*  BUN 21 25* 28* 29*  CREATININE 1.03* 1.09* 1.13* 0.81  CALCIUM 9.0 8.6* 8.8* 8.7*  MG 2.1 2.0 2.3  --   PHOS  --   --  2.8  --    GFR: Estimated Creatinine Clearance: 46.4 mL/min (by C-G formula based on SCr of 0.81 mg/dL). Liver Function Tests: Recent Labs  Lab 07/01/21 1954 07/02/21 0322  AST 18 16  ALT 14 11  ALKPHOS 87 91  BILITOT 1.1 0.9  PROT 6.4* 5.4*  ALBUMIN 2.8* 2.3*   No results for input(s): LIPASE, AMYLASE in the last 168 hours. No results for input(s): AMMONIA in the last 168 hours. Coagulation Profile: No results for input(s): INR, PROTIME in the last 168 hours. Cardiac Enzymes: No results for input(s): CKTOTAL, CKMB, CKMBINDEX, TROPONINI in the last 168 hours. BNP (last 3 results) No results  for input(s): PROBNP in the last 8760 hours. HbA1C: No results for input(s): HGBA1C in the last 72 hours. CBG: No results for input(s): GLUCAP in the last 168 hours. Lipid Profile: No results for input(s): CHOL, HDL, LDLCALC, TRIG, CHOLHDL, LDLDIRECT in the last 72 hours. Thyroid Function Tests: No results for input(s): TSH, T4TOTAL, FREET4, T3FREE, THYROIDAB in the last 72 hours. Anemia Panel: No results for input(s): VITAMINB12, FOLATE, FERRITIN, TIBC, IRON, RETICCTPCT in the last 72 hours. Sepsis Labs: Recent Labs  Lab 07/02/21 0322  PROCALCITON 0.89  LATICACIDVEN  1.1    Recent Results (from the past 240 hour(s))  Resp Panel by RT-PCR (Flu A&B, Covid) Nasopharyngeal Swab     Status: None   Collection Time: 07/01/21  7:35 PM   Specimen: Nasopharyngeal Swab; Nasopharyngeal(NP) swabs in vial transport medium  Result Value Ref Range Status   SARS Coronavirus 2 by RT PCR NEGATIVE NEGATIVE Final    Comment: (NOTE) SARS-CoV-2 target nucleic acids are NOT DETECTED.  The SARS-CoV-2 RNA is generally detectable in upper respiratory specimens during the acute phase of infection. The lowest concentration of SARS-CoV-2 viral copies this assay can detect is 138 copies/mL. A negative result does not preclude SARS-Cov-2 infection and should not be used as the sole basis for treatment or other patient management decisions. A negative result may occur with  improper specimen collection/handling, submission of specimen other than nasopharyngeal swab, presence of viral mutation(s) within the areas targeted by this assay, and inadequate number of viral copies(<138 copies/mL). A negative result must be combined with clinical observations, patient history, and epidemiological information. The expected result is Negative.  Fact Sheet for Patients:  EntrepreneurPulse.com.au  Fact Sheet for Healthcare Providers:  IncredibleEmployment.be  This test is no t yet  approved or cleared by the [place] FDA and  has been authorized for detection and/or diagnosis of SARS-CoV-2 by FDA under an Emergency Use Authorization (EUA). This EUA will remain  in effect (meaning this test can be used) for the duration of the COVID-19 declaration under Section 564(b)(1) of the Act, 21 U.S.C.section 360bbb-3(b)(1), unless the authorization is terminated  or revoked sooner.       Influenza A by PCR NEGATIVE NEGATIVE Final   Influenza B by PCR NEGATIVE NEGATIVE Final    Comment: (NOTE) The Xpert Xpress SARS-CoV-2/FLU/RSV plus assay is intended as an aid in the diagnosis of influenza from Nasopharyngeal swab specimens and should not be used as a sole basis for treatment. Nasal washings and aspirates are unacceptable for Xpert Xpress SARS-CoV-2/FLU/RSV testing.  Fact Sheet for Patients: EntrepreneurPulse.com.au  Fact Sheet for Healthcare Providers: IncredibleEmployment.be  This test is not yet approved or cleared by the [place] FDA and has been authorized for detection and/or diagnosis of SARS-CoV-2 by FDA under an Emergency Use Authorization (EUA). This EUA will remain in effect (meaning this test can be used) for the duration of the COVID-19 declaration under Section 564(b)(1) of the Act, 21 U.S.C. section 360bbb-3(b)(1), unless the authorization is terminated or revoked.  Performed at [facility], [place] [address], [place], [place] [address]   Blood culture (routine x 2)     Status: None (Preliminary result)   Collection Time: 07/01/21  7:54 PM   Specimen: BLOOD RIGHT ARM  Result Value Ref Range Status   Specimen Description BLOOD RIGHT ARM  Final   Special Requests   Final    BOTTLES DRAWN AEROBIC AND ANAEROBIC Blood Culture results may not be optimal due to an excessive volume of blood received in culture bottles   Culture   Final    NO GROWTH 4 DAYS Performed at [facility], [place] [address] [place], [place] [address]    Report Status PENDING  Incomplete  Blood culture (routine x 2)     Status: None (Preliminary result)   Collection Time: 07/01/21  7:55 PM   Specimen: BLOOD RIGHT HAND  Result Value Ref Range Status   Specimen Description BLOOD RIGHT HAND  Final   Special Requests   Final  BOTTLES DRAWN AEROBIC ONLY Blood Culture adequate volume   Culture   Final    NO GROWTH 4 DAYS Performed at [facility], [place] [address] [place], [place] [address]    Report Status PENDING  Incomplete         Radiology Studies: [place] RT LOWER EXTREM LTD SOFT TISSUE NON VASCULAR  Result Date: 07/05/2021 CLINICAL DATA:  Right foot swelling, eval for fluid EXAM: ULTRASOUND right LOWER EXTREMITY LIMITED TECHNIQUE: Ultrasound examination of the lower extremity soft tissues was performed in the area of clinical concern. COMPARISON:  None. FINDINGS: Within the area of concern along the anterior right foot, there are multiple areas of complex fluid, largest visible pocket measuring approximately 1.4 x 0.8 cm and 2.2 x 0.7 cm. There is generalized subcutaneous edema and increased vascularity. IMPRESSION: Subcutaneous edema and increased vascularity as can be seen in cellulitis. Complex collections of fluid measuring up to 1.4 x 0.8 cm and 2.2 x 0.7 cm are seen. These could represent areas of localized lymphedema or phlegmon/developing abscesses. MRI with and without contrast may be useful for further characterization. Electronically Signed   By: Maurine Simmering M.D.   On: 07/05/2021 12:28        Scheduled Meds:  doxycycline  100 mg Oral Q12H   enoxaparin (LOVENOX) injection  40 mg Subcutaneous Q24H   fluticasone furoate-vilanterol  1 puff Inhalation Daily   levalbuterol  1.25 mg Nebulization Q6H   predniSONE  40 mg Oral Q breakfast   umeclidinium bromide  1 puff Inhalation Daily   Continuous Infusions:     LOS: 4 days    Time spent: 32 minutes    Barb Merino, MD [facility] Pager [phone]

## 2021-07-06 NOTE — Consult Note (Signed)
Reason for Consult:right foot pain Referring Physician: Dr. Wende Neighbors McNeer Valerie Gay is an 80 y.o. female.  HPI: 80 y/o female with PMH of COPD requiring home 02 is admitted for right foot cellulitis.  She has had an MRI which showed no abscess.  A more recent ultrasound shows loculated fluid collection of the dorsal right foot.  I'm asked to consult on surgical management of this condition.  She had breakfast this morning and Lovenox.  Past Medical History:  Diagnosis Date   Anemia age 56   Cellulitis and abscess of foot 03/2017   rt foot   Colon polyps    2010    COPD (chronic obstructive pulmonary disease) (HCC)    Diverticulosis    History of hiatal hernia    History of kidney stones    Hyperlipidemia    On home oxygen therapy     Past Surgical History:  Procedure Laterality Date   BALLOON DILATION N/A 03/08/2017   Procedure: BALLOON DILATION;  Surgeon: Manus Gunning, MD;  Location: [facility];  Service: Gastroenterology;  Laterality: N/A;   CATARACT EXTRACTION Right    August 15, 2013   CHOLECYSTECTOMY N/A 06/22/2014   Procedure: LAPAROSCOPIC CHOLECYSTECTOMY WITH INTRAOPERATIVE CHOLANGIOGRAM;  Surgeon: Gayland Curry, MD;  Location: [facility];  Service: General;  Laterality: N/A;   COLONOSCOPY N/A 02/19/2017   Procedure: COLONOSCOPY;  Surgeon: Manus Gunning, MD;  Location: [facility];  Service: Gastroenterology;  Laterality: N/A;   ESOPHAGOGASTRODUODENOSCOPY N/A 02/19/2017   Procedure: ESOPHAGOGASTRODUODENOSCOPY (EGD);  Surgeon: Manus Gunning, MD;  Location: [facility];  Service: Gastroenterology;  Laterality: N/A;   ESOPHAGOGASTRODUODENOSCOPY N/A 03/08/2017   Procedure: ESOPHAGOGASTRODUODENOSCOPY (EGD);  Surgeon: Manus Gunning, MD;  Location: [facility];  Service: Gastroenterology;  Laterality: N/A;   ESOPHAGOGASTRODUODENOSCOPY N/A 04/06/2017   Procedure: ESOPHAGOGASTRODUODENOSCOPY (EGD);  Surgeon: Manus Gunning, MD;   Location: [facility];  Service: Gastroenterology;  Laterality: N/A;   ESOPHAGOGASTRODUODENOSCOPY (EGD) WITH PROPOFOL N/A 04/27/2017   Procedure: ESOPHAGOGASTRODUODENOSCOPY (EGD) WITH PROPOFOL;  Surgeon: Manus Gunning, MD;  Location: [facility];  Service: Gastroenterology;  Laterality: N/A;   NM MYOVIEW LTD  10/2016    EF 55-65% with normal LV function. LOW RISK. No ischemia or infarction.   TONSILLECTOMY     TRANSTHORACIC ECHOCARDIOGRAM  06/2019   EF 60 to 65%.  No R WMA.  Normal RV function.  Mild calcification of aortic valve but no stenosis.  Mild mitral valve leaflet calcification but no stenosis or regurgitation.    Family History  Problem Relation Age of Onset   Alzheimer's disease Mother    Diabetes Mother    Cancer Father    Stomach cancer Paternal Grandmother    Colon cancer Neg Hx    Esophageal cancer Neg Hx     Social History:  reports that she quit smoking about 2 years ago. Her smoking use included cigarettes. She has a 30.00 pack-year smoking history. She has never used smokeless tobacco. She reports that she does not drink alcohol and does not use drugs.  Allergies:  Allergies  Allergen Reactions   Spiriva Handihaler [Tiotropium Bromide Monohydrate] Hives, Itching and Rash     on upper body   Banana Itching    Medications: I have reviewed the patient's current medications.  Results for orders placed or performed during the hospital encounter of 07/01/21 (from the past 48 hour(s))  CBC     Status: Abnormal   Collection Time: 07/05/21  1:35 AM  Result Value  Ref Range   WBC 13.2 (H) 4.0 - 10.5 K/uL   RBC 2.66 (L) 3.87 - 5.11 MIL/uL   Hemoglobin 8.5 (L) 12.0 - 15.0 g/dL   HCT 26.3 (L) 36.0 - 46.0 %   MCV 98.9 80.0 - 100.0 fL   MCH 32.0 26.0 - 34.0 pg   MCHC 32.3 30.0 - 36.0 g/dL   RDW 15.0 11.5 - 15.5 %   Platelets 466 (H) 150 - 400 K/uL   nRBC 0.0 0.0 - 0.2 %    Comment: Performed at [facility], [place] [address], [place], [place] [id number]   Basic metabolic panel     Status: Abnormal   Collection Time: 07/05/21  1:35 AM  Result Value Ref Range   Sodium 139 135 - 145 mmol/L   Potassium 3.3 (L) 3.5 - 5.1 mmol/L   Chloride 105 98 - 111 mmol/L   CO2 28 22 - 32 mmol/L   Glucose, Bld 113 (H) 70 - 99 mg/dL    Comment: Glucose reference range applies only to samples taken after fasting for at least 8 hours.   BUN 29 (H) 8 - 23 mg/dL   Creatinine, Ser 0.81 0.44 - 1.00 mg/dL   Calcium 8.7 (L) 8.9 - 10.3 mg/dL   GFR, Estimated >60 >60 mL/min    Comment: (NOTE) Calculated using the CKD-EPI Creatinine Equation (2021)    Anion gap 6 5 - 15    Comment: Performed at [facility] [address] [place], [place] [address]  CBC     Status: Abnormal   Collection Time: 07/06/21  3:04 AM  Result Value Ref Range   WBC 12.1 (H) 4.0 - 10.5 K/uL   RBC 2.54 (L) 3.87 - 5.11 MIL/uL   Hemoglobin 7.9 (L) 12.0 - 15.0 g/dL   HCT 25.8 (L) 36.0 - 46.0 %   MCV 101.6 (H) 80.0 - 100.0 fL   MCH 31.1 26.0 - 34.0 pg   MCHC 30.6 30.0 - 36.0 g/dL   RDW 15.3 11.5 - 15.5 %   Platelets 505 (H) 150 - 400 K/uL   nRBC 0.0 0.0 - 0.2 %    Comment: Performed at [facility], [place] [address] [place], [place] [address]    [place] RT LOWER EXTREM LTD SOFT TISSUE NON VASCULAR  Result Date: 07/05/2021 CLINICAL DATA:  Right foot swelling, eval for fluid EXAM: ULTRASOUND right LOWER EXTREMITY LIMITED TECHNIQUE: Ultrasound examination of the lower extremity soft tissues was performed in the area of clinical concern. COMPARISON:  None. FINDINGS: Within the area of concern along the anterior right foot, there are multiple areas of complex fluid, largest visible pocket measuring approximately 1.4 x 0.8 cm and 2.2 x 0.7 cm. There is generalized subcutaneous edema and increased vascularity. IMPRESSION: Subcutaneous edema and increased vascularity as can be seen in cellulitis. Complex collections of fluid measuring up to 1.4 x 0.8 cm and 2.2 x 0.7 cm are seen. These could  represent areas of localized lymphedema or phlegmon/developing abscesses. MRI with and without contrast may be useful for further characterization. Electronically Signed   By: Maurine Simmering M.D.   On: 07/05/2021 12:28    ROS:  no recent f/c/n/v.  Pain, erythema and swelling of right foot.  10 system review is o/w neg. PE:  Blood pressure (!) 104/58, pulse (!) 102, temperature 98.1 F (36.7 C), temperature source Oral, resp. rate 20, height '5\' 2"'$  (1.575 m), weight 62 kg, SpO2 99 %. 54 wd elderly  female in nad.  A and O.  EOMI.  Resp unlabored.  R foot with dorsal swelling.  Fluctuant area at the dorsum of the foot.  NTTP at the dorsal foot.  No lymphadenopathy or lymphangiitis.  Skin intact.  Brisk cap refill at the toes.  Sens to LT intact at the toes.  Assessment/Plan: R foot cellulitis and dorsal fluid collection - the cellulitis appears to be resolved.  She has an area of fluctuance that most likely represents liquafaction necrosis.  We'll compress the area with an ace wrap  to see if this area will heal without surgery.  I don't believe this represents an abscess.  She is not a good surgical candidate based on her poor lung function.  I'll follow.  Possibly surgery Tuesday afternoon depending on how the foot looks.  Valerie Gay 07/06/2021, 9:56 AM

## 2021-07-06 NOTE — NC FL2 (Signed)
[place] LEVEL OF CARE SCREENING TOOL     IDENTIFICATION  Patient Name: Valerie Gay Birthdate: 06/10/1941 Sex: female Admission Date (Current Location): 07/01/2021  [facility] and [place] Number:  Herbalist and Address:  [facility], [place] [address] [place], [place] [address]      Provider Number: [id number]  Attending Physician Name and Address:  Barb Merino, MD  Relative Name and Phone Number:  Madison Hickman (Daughter)   [phone] [facility] Phone)    Current Level of Care: Hospital Recommended Level of Care: [place] Prior Approval Number:    Date Approved/Denied:   PASRR Number: [id number]  Discharge Plan: SNF    Current Diagnoses: Patient Active Problem List   Diagnosis Date Noted   Nicotine dependence, cigarettes, uncomplicated 0000000   Bilateral edema of lower extremity 01/22/2020   Edema 06/23/2019   CAD (coronary artery disease) 06/23/2019   Chronic respiratory failure with hypoxia ([place]) 01/17/2019   Cellulitis of right lower extremity 04/13/2017   GERD (gastroesophageal reflux disease) 04/13/2017   Esophageal stricture    Dysphagia    Abnormal barium swallow    History of colonic polyps    Polyp of ascending colon    Polyp of transverse colon    Acute URI 12/13/2016   Coronary artery calcification 10/23/2016   Hyperlipidemia LDL goal <100 10/23/2016   Medication management 10/23/2016   DOE (dyspnea on exertion) 10/23/2016   Lung nodule 02/26/2015   Nodule of right lung 11/28/2014   Acute calculous cholecystitis 06/22/2014   Acute cholecystitis 06/22/2014   Pre-operative respiratory examination 06/22/2014   Abnormal chest x-ray 05/28/2014   COPD Gold II D, still smoking 05/07/2014   Nicotine abuse 03/27/2014   Hypokalemia 03/27/2014   Leukocytosis 03/27/2014   TOBACCO USE 11/05/2008   COPD (chronic obstructive pulmonary disease) ([place]) 11/05/2008   HYPERGLYCEMIA  11/05/2008   ANEMIA-NOS 07/21/2007    Orientation RESPIRATION BLADDER Height & Weight     Self, Time, Situation, Place  O2 (3L [place]) Incontinent, External catheter Weight: 136 lb 11 oz (62 kg) Height:  '5\' 2"'$  (157.5 cm)  BEHAVIORAL SYMPTOMS/MOOD NEUROLOGICAL BOWEL NUTRITION STATUS      Continent Diet (see d/c summary)  AMBULATORY STATUS COMMUNICATION OF NEEDS Skin   Extensive Assist   Normal                       Personal Care Assistance Level of Assistance  Bathing, Dressing, Feeding Bathing Assistance: Maximum assistance Feeding assistance: Independent Dressing Assistance: Maximum assistance     Functional Limitations Info  Sight, Hearing, Speech Sight Info: Adequate Hearing Info: Adequate Speech Info: Adequate    SPECIAL CARE FACTORS FREQUENCY  PT (By licensed PT), OT (By licensed OT)     PT Frequency: 5x/week OT Frequency: 5x/week            Contractures Contractures Info: Not present    Additional Factors Info  Code Status, Allergies Code Status Info: Full code Allergies Info: Spiriva Handihaler (tiotropium Bromide Monohydrate), banana           Current Medications (07/06/2021):  This is the current hospital active medication list Current Facility-Administered Medications  Medication Dose Route Frequency Provider Last Rate Last Admin   acetaminophen (TYLENOL) tablet 650 mg  650 mg Oral Q6H PRN Vernelle Emerald, MD   650 mg at 07/06/21 [id number]   Or   acetaminophen (TYLENOL) suppository 650 mg  650 mg Rectal Q6H PRN  Vernelle Emerald, MD       albuterol (PROVENTIL) (2.5 MG/3ML) 0.083% nebulizer solution 2.5 mg  2.5 mg Nebulization Q4H PRN Shalhoub, Sherryll Burger, MD   2.5 mg at 07/03/21 1047   doxycycline (VIBRA-TABS) tablet 100 mg  100 mg Oral Q12H Danford, Suann Larry, MD   100 mg at 07/06/21 0853   fluticasone furoate-vilanterol (BREO ELLIPTA) 200-25 MCG/INH 1 puff  1 puff Inhalation Daily Shalhoub, Sherryll Burger, MD   1 puff at 07/06/21 0733   levalbuterol  (XOPENEX) nebulizer solution 1.25 mg  1.25 mg Nebulization Q6H Barb Merino, MD   1.25 mg at 07/06/21 0835   ondansetron (ZOFRAN) tablet 4 mg  4 mg Oral Q6H PRN Vernelle Emerald, MD       Or   ondansetron [facility]) injection 4 mg  4 mg Intravenous Q6H PRN Shalhoub, Sherryll Burger, MD       polyethylene glycol (MIRALAX / GLYCOLAX) packet 17 g  17 g Oral Daily PRN Shalhoub, Sherryll Burger, MD       polyvinyl alcohol (LIQUIFILM TEARS) 1.4 % ophthalmic solution 2 drop  2 drop Both Eyes TID PRN Shalhoub, Sherryll Burger, MD       predniSONE (DELTASONE) tablet 40 mg  40 mg Oral Q breakfast Danford, Suann Larry, MD   40 mg at 07/06/21 0853   umeclidinium bromide (INCRUSE ELLIPTA) 62.5 MCG/INH 1 puff  1 puff Inhalation Daily Shalhoub, Sherryll Burger, MD   1 puff at 07/06/21 [id number]     Discharge Medications: Please see discharge summary for a list of discharge medications.  Relevant Imaging Results:  Relevant Lab Results:   Additional Information SSN: [id number] pt is covid vaxed and boosted x4  Alaa Eyerman, Veronia Beets, LCSW

## 2021-07-07 ENCOUNTER — Inpatient Hospital Stay (HOSPITAL_COMMUNITY): Payer: Medicare Other

## 2021-07-07 DIAGNOSIS — L03115 Cellulitis of right lower limb: Secondary | ICD-10-CM | POA: Diagnosis not present

## 2021-07-07 MED ORDER — METHYLPREDNISOLONE SODIUM SUCC 40 MG IJ SOLR
40.0000 mg | Freq: Two times a day (BID) | INTRAMUSCULAR | Status: DC
  Administered 2021-07-07 – 2021-07-09 (×5): 40 mg via INTRAVENOUS
  Filled 2021-07-07 (×5): qty 1

## 2021-07-07 NOTE — Progress Notes (Signed)
Physical Therapy Treatment Patient Details Name: Valerie Gay MRN: [id number] DOB: 1940/12/31 Today's Date: 07/07/2021    History of Present Illness 80 year old female with past medical history of COPD, chronic respiratory failure (on 3LPM via [place]), several hospitalizations for cellulitis in the past who presented to [facility] emergency department 07/01/21 with complaints of right lower extremity pain redness and swelling. +cellulitis;    PT Comments    Pt admitted with above diagnosis. Pt was able to be assisted up to chair with mod assist for transfer with pt unable to place much weight on right LE. Pt with DOE 4/4 and sats to 86% on 4LO2 after transfer and took pt incr time to get her breath. Will continue to follow pt.  Pt currently with functional limitations due to balance and endurance deficits. Pt will benefit from skilled PT to increase their independence and safety with mobility to allow discharge to the venue listed below.      Follow Up Recommendations  SNF     Equipment Recommendations  None recommended by PT    Recommendations for Other Services OT consult     Precautions / Restrictions Precautions Precautions: Fall Precaution Comments: fell recently trying to use her foot to get something out from under sofa Restrictions Weight Bearing Restrictions: Yes RLE Weight Bearing: Weight bearing as tolerated    Mobility  Bed Mobility Overal bed mobility: Needs Assistance Bed Mobility: Supine to Sit   Sidelying to sit: Min guard;HOB elevated (with rail) Supine to sit: Min guard     General bed mobility comments: Pt able to come to EOB with cues and a little assist for scooting.  Dizzy reports when she first sat up. O2 sats did drop to 86% with activity but improved once pt was in chair. Pt kept her 4LO2 on entire treatment. Pt with very little reserve for activity and only agreed to get to chair.  DOE 4/4 once in chair.    Transfers Overall transfer  level: Needs assistance Equipment used: 1 person hand held assist Transfers: Sit to/from [facility] Sit to Stand: Mod assist;From elevated surface Stand pivot transfers: Min assist;Mod assist       General transfer comment: Pt held to PT's UE and was able to power up and pivot to the recliner not putting much weight on right LE due to pain.  Ambulation/Gait                 Stairs             Wheelchair Mobility    Modified Rankin (Stroke Patients Only)       Balance Overall balance assessment: Needs assistance Sitting-balance support: No upper extremity supported;Feet supported Sitting balance-Leahy Scale: Fair     Standing balance support: Bilateral upper extremity supported;During functional activity Standing balance-Leahy Scale: Poor Standing balance comment: relies on UE support and does not put weight on right foot currently.                            Cognition Arousal/Alertness: Awake/alert Behavior During Therapy: WFL for tasks assessed/performed Overall Cognitive Status: Within Functional Limits for tasks assessed                                        Exercises General Exercises - Lower Extremity Ankle Circles/Pumps: AROM;Both;10 reps;Supine Heel Slides: AROM;Both;10  reps;Supine    General Comments        Pertinent Vitals/Pain Pain Assessment: Faces Faces Pain Scale: Hurts even more Pain Location: rt foot Pain Descriptors / Indicators: Burning;Grimacing;Throbbing Pain Intervention(s): Limited activity within patient's tolerance;Monitored during session;Repositioned    Home Living                      Prior Function            PT Goals (current goals can now be found in the care plan section) Progress towards PT goals: Progressing toward goals    Frequency    Min 2X/week      PT Plan Current plan remains appropriate    Co-evaluation              AM-PAC PT "6  Clicks" Mobility   Outcome Measure  Help needed turning from your back to your side while in a flat bed without using bedrails?: A Little Help needed moving from lying on your back to sitting on the side of a flat bed without using bedrails?: A Little Help needed moving to and from a bed to a chair (including a wheelchair)?: A Lot Help needed standing up from a chair using your arms (e.g., wheelchair or bedside chair)?: A Lot Help needed to walk in hospital room?: Total Help needed climbing 3-5 steps with a railing? : Total 6 Click Score: 12    End of Session Equipment Utilized During Treatment: Oxygen;Gait belt Activity Tolerance: Patient limited by fatigue Patient left: with call bell/phone within reach;in chair;with chair alarm set Nurse Communication: Mobility status PT Visit Diagnosis: Pain;Unsteadiness on feet (R26.81);History of falling (Z91.81) Pain - Right/Left: Left Pain - part of body: Ankle and joints of foot     Time: 1003-1013 PT Time Calculation (min) (ACUTE ONLY): 10 min  Charges:  $Therapeutic Activity: 8-22 mins                     Valerie Gay M,PT Acute Rehab Services [phone] (pager)    Valerie Gay 07/07/2021, 12:26 PM

## 2021-07-07 NOTE — Progress Notes (Signed)
Subjective:     Patient reports that she doesn't have any pain on her Right foot if there isn't any pressure on it.  She reports that an ACE bandage was never applied to her foot yesterday.   Objective:   VITALS:  Temp:  [97.7 F (36.5 C)-98.5 F (36.9 C)] 97.7 F (36.5 C) (08/22 0342) Pulse Rate:  [83-107] 83 (08/22 0727) Resp:  [17-20] 17 (08/22 0727) BP: (116-128)/(62-74) 128/62 (08/22 0342) SpO2:  [96 %-100 %] 96 % (08/22 0727)  General: WDWN patient in NAD. Psych:  Appropriate mood and affect. Neuro:  A&O x 3, Moving all extremities, sensation intact to light touch HEENT:  EOMs intact Chest:  Even non-labored respirations Skin:  Mild luctuance to dorsal R foot.  No wound or ulcer.  Extremities: warm/dry, mild edema to R foot dorsally, no echymosis.  No lymphadenopathy.  Tender to palpation when checking pulse. Pulses: Dorsalis pedis 2+ MSK:  ROM: EHL/FHL intact, MMT: able to perform quad set   LABS Recent Labs    07/05/21 0135 07/06/21 0304  HGB 8.5* 7.9*  WBC 13.2* 12.1*  PLT 466* 505*   Recent Labs    07/05/21 0135  NA 139  K 3.3*  CL 105  CO2 28  BUN 29*  CREATININE 0.81  GLUCOSE 113*   No results for input(s): LABPT, INR in the last 72 hours.   Assessment/Plan:   Right foot dorsal fluid collection.  WBAT R LE Apply ACE bandage to patient's Right foot. No obvious abscess or infection on clinical exam this morning. Plan to follow up in the outpatient setting with Dr. Doran Durand.  Mechele Claude PA-C [facility] Office:  [phone]

## 2021-07-07 NOTE — Progress Notes (Signed)
PROGRESS NOTE    Valerie Gay  [id number] DOB: 21-Mar-1941 DOA: 07/01/2021 PCP: Kathyrn Lass, MD    Brief Narrative:  Patient with history of COPD, chronic hypoxemic respiratory failure on 3 L oxygen at home, previous cellulitis of the leg presented to the emergency department with complaints of right lower extremity pain redness and swelling for 2 days.  In the emergency room, hemodynamically stable.  Leukocytosis with WBC count 40,000.  Started on IV antibiotics and admitted to the hospital. Cellulitis significantly improved with localized collection on the right foot. Significant bronchospasm and COPD exacerbation.   Assessment & Plan:   Principal Problem:   Cellulitis of right lower extremity Active Problems:   COPD (chronic obstructive pulmonary disease) (HCC)   Nicotine abuse   Hypokalemia   Chronic respiratory failure with hypoxia (HCC)   Nicotine dependence, cigarettes, uncomplicated  Cellulitis of the right lower extremity: Admitted with severe extending cellulitis of right leg.  Clinically improving.  Treated with vancomycin and cefepime.  WBC normalizing. On doxycycline now. Fluctuant swelling dorsum of the right foot.  Ultrasound consistent with complex fluid. Seen by orthopedics.  Recommended conservative management with compression. Will treat with 10 days of total antibiotics.  If surgical drain needed, culture will be appreciated.  COPD with chronic hypoxemic respiratory failure: Now with exacerbation. Patient does have severe COPD.  Using 3 L oxygen that she uses at home. Patient is on Dulera that she will continue.   Patient with significant bronchospasm today.  Will change to Solu-Medrol IV. Aggressive bronchodilator therapy.  A scheduled Xopenex.  We will repeat a chest x-ray today.  Hypokalemia: Replaced and adequate.     DVT prophylaxis: enoxaparin (LOVENOX) injection 40 mg Start: 07/07/21 1000   Code Status: Full code Family  Communication: Daughter on the phone. Disposition Plan: Status is: Inpatient  Remains inpatient appropriate because:Inpatient level of care appropriate due to severity of illness  Dispo: The patient is from: Home              Anticipated d/c is to: Home with home health.              Patient currently is not medically stable to d/c.   Difficult to place patient No         Consultants:  Orthopedics  Procedures:  None   Antimicrobials:  Antibiotics Given (last 72 hours)     Date/Time Action Medication Dose Rate   07/04/21 2057 New Bag/Given   ceFEPIme (MAXIPIME) 2 g in sodium chloride 0.9 % 100 mL IVPB 2 g 200 mL/hr   07/05/21 0148 New Bag/Given   vancomycin (VANCOREADY) IVPB 750 mg/150 mL 750 mg 150 mL/hr   07/05/21 0920 New Bag/Given   ceFEPIme (MAXIPIME) 2 g in sodium chloride 0.9 % 100 mL IVPB 2 g 200 mL/hr   07/05/21 1729 Given   doxycycline (VIBRA-TABS) tablet 100 mg 100 mg    07/05/21 2055 Given   doxycycline (VIBRA-TABS) tablet 100 mg 100 mg    07/06/21 0853 Given   doxycycline (VIBRA-TABS) tablet 100 mg 100 mg    07/06/21 2128 Given   doxycycline (VIBRA-TABS) tablet 100 mg 100 mg    07/07/21 [id number] Given   doxycycline (VIBRA-TABS) tablet 100 mg 100 mg           Subjective: Patient seen and examined.  Feels like her foot is better.  Feels difficulty breathing and unable to take deep breaths in.  On 4 L oxygen.  Objective: Vitals:  07/06/21 1935 07/06/21 2128 07/07/21 0342 07/07/21 0727  BP:  116/69 128/62   Pulse:  100 (!) 107 83  Resp:  20  17  Temp:  98 F (36.7 C) 97.7 F (36.5 C)   TempSrc:  Oral Axillary   SpO2: 99% 97% 100% 96%  Weight:      Height:        Intake/Output Summary (Last 24 hours) at 07/07/2021 1149 Last data filed at 07/06/2021 2133 Gross per 24 hour  Intake --  Output 200 ml  Net -200 ml   Filed Weights   07/01/21 1923  Weight: 62 kg    Examination:  General: Patient is anxious.  She is on 4 L oxygen.  In mild  distress due to breathing problem. Cardiovascular: S1-S2 normal.  Tachycardic. Respiratory: Bilateral poor air entry.  No added sounds due to poor air entry. Gastrointestinal: Soft and nontender.  Bowel sounds present. Ext: Right leg with almost normal color.  There is a fluctuant swelling on the dorsum of the right foot about 2 X 2 cm in size. Neuro: Equal in all extremities.    Data Reviewed: I have personally reviewed following labs and imaging studies  CBC: Recent Labs  Lab 07/01/21 1954 07/02/21 0322 07/03/21 0111 07/05/21 0135 07/06/21 0304  WBC 40.1* 34.6* 22.6* 13.2* 12.1*  NEUTROABS 36.7* 31.0* 20.1*  --   --   HGB 10.8* 9.4* 8.2* 8.5* 7.9*  HCT 33.4* 29.7* 26.1* 26.3* 25.8*  MCV 99.1 101.7* 100.0 98.9 101.6*  PLT 513* 417* 429* 466* Q000111Q*   Basic Metabolic Panel: Recent Labs  Lab 07/01/21 1954 07/02/21 0322 07/03/21 0111 07/05/21 0135  NA 138 138 140 139  K 2.9* 3.7 3.5 3.3*  CL 95* 99 102 105  CO2 32 '29 28 28  '$ GLUCOSE 105* 92 92 113*  BUN 21 25* 28* 29*  CREATININE 1.03* 1.09* 1.13* 0.81  CALCIUM 9.0 8.6* 8.8* 8.7*  MG 2.1 2.0 2.3  --   PHOS  --   --  2.8  --    GFR: Estimated Creatinine Clearance: 46.4 mL/min (by C-G formula based on SCr of 0.81 mg/dL). Liver Function Tests: Recent Labs  Lab 07/01/21 1954 07/02/21 0322  AST 18 16  ALT 14 11  ALKPHOS 87 91  BILITOT 1.1 0.9  PROT 6.4* 5.4*  ALBUMIN 2.8* 2.3*   No results for input(s): LIPASE, AMYLASE in the last 168 hours. No results for input(s): AMMONIA in the last 168 hours. Coagulation Profile: No results for input(s): INR, PROTIME in the last 168 hours. Cardiac Enzymes: No results for input(s): CKTOTAL, CKMB, CKMBINDEX, TROPONINI in the last 168 hours. BNP (last 3 results) No results for input(s): PROBNP in the last 8760 hours. HbA1C: No results for input(s): HGBA1C in the last 72 hours. CBG: No results for input(s): GLUCAP in the last 168 hours. Lipid Profile: No results for  input(s): CHOL, HDL, LDLCALC, TRIG, CHOLHDL, LDLDIRECT in the last 72 hours. Thyroid Function Tests: No results for input(s): TSH, T4TOTAL, FREET4, T3FREE, THYROIDAB in the last 72 hours. Anemia Panel: No results for input(s): VITAMINB12, FOLATE, FERRITIN, TIBC, IRON, RETICCTPCT in the last 72 hours. Sepsis Labs: Recent Labs  Lab 07/02/21 0322  PROCALCITON 0.89  LATICACIDVEN 1.1    Recent Results (from the past 240 hour(s))  Resp Panel by RT-PCR (Flu A&B, Covid) Nasopharyngeal Swab     Status: None   Collection Time: 07/01/21  7:35 PM   Specimen: Nasopharyngeal Swab; Nasopharyngeal(NP) swabs in vial  transport medium  Result Value Ref Range Status   SARS Coronavirus 2 by RT PCR NEGATIVE NEGATIVE Final    Comment: (NOTE) SARS-CoV-2 target nucleic acids are NOT DETECTED.  The SARS-CoV-2 RNA is generally detectable in upper respiratory specimens during the acute phase of infection. The lowest concentration of SARS-CoV-2 viral copies this assay can detect is 138 copies/mL. A negative result does not preclude SARS-Cov-2 infection and should not be used as the sole basis for treatment or other patient management decisions. A negative result may occur with  improper specimen collection/handling, submission of specimen other than nasopharyngeal swab, presence of viral mutation(s) within the areas targeted by this assay, and inadequate number of viral copies(<138 copies/mL). A negative result must be combined with clinical observations, patient history, and epidemiological information. The expected result is Negative.  Fact Sheet for Patients:  EntrepreneurPulse.com.au  Fact Sheet for Healthcare Providers:  IncredibleEmployment.be  This test is no t yet approved or cleared by the [place] FDA and  has been authorized for detection and/or diagnosis of SARS-CoV-2 by FDA under an Emergency Use Authorization (EUA). This EUA will remain  in effect  (meaning this test can be used) for the duration of the COVID-19 declaration under Section 564(b)(1) of the Act, 21 U.S.C.section 360bbb-3(b)(1), unless the authorization is terminated  or revoked sooner.       Influenza A by PCR NEGATIVE NEGATIVE Final   Influenza B by PCR NEGATIVE NEGATIVE Final    Comment: (NOTE) The Xpert Xpress SARS-CoV-2/FLU/RSV plus assay is intended as an aid in the diagnosis of influenza from Nasopharyngeal swab specimens and should not be used as a sole basis for treatment. Nasal washings and aspirates are unacceptable for Xpert Xpress SARS-CoV-2/FLU/RSV testing.  Fact Sheet for Patients: EntrepreneurPulse.com.au  Fact Sheet for Healthcare Providers: IncredibleEmployment.be  This test is not yet approved or cleared by the [place] FDA and has been authorized for detection and/or diagnosis of SARS-CoV-2 by FDA under an Emergency Use Authorization (EUA). This EUA will remain in effect (meaning this test can be used) for the duration of the COVID-19 declaration under Section 564(b)(1) of the Act, 21 U.S.C. section 360bbb-3(b)(1), unless the authorization is terminated or revoked.  Performed at [facility], [place] [address], [place], [place] [address]   Blood culture (routine x 2)     Status: None   Collection Time: 07/01/21  7:54 PM   Specimen: BLOOD RIGHT ARM  Result Value Ref Range Status   Specimen Description BLOOD RIGHT ARM  Final   Special Requests   Final    BOTTLES DRAWN AEROBIC AND ANAEROBIC Blood Culture results may not be optimal due to an excessive volume of blood received in culture bottles   Culture   Final    NO GROWTH 5 DAYS Performed at [facility], [place] [address], [place], [place] [address]    Report Status 07/06/2021 FINAL  Final  Blood culture (routine x 2)     Status: None   Collection Time: 07/01/21  7:55 PM   Specimen: BLOOD RIGHT HAND  Result Value Ref Range Status    Specimen Description BLOOD RIGHT HAND  Final   Special Requests   Final    BOTTLES DRAWN AEROBIC ONLY Blood Culture adequate volume   Culture   Final    NO GROWTH 5 DAYS Performed at [facility], [address], [place], [place] [address]    Report Status 07/06/2021 FINAL  Final  Radiology Studies: [place] RT LOWER EXTREM LTD SOFT TISSUE NON VASCULAR  Result Date: 07/05/2021 CLINICAL DATA:  Right foot swelling, eval for fluid EXAM: ULTRASOUND right LOWER EXTREMITY LIMITED TECHNIQUE: Ultrasound examination of the lower extremity soft tissues was performed in the area of clinical concern. COMPARISON:  None. FINDINGS: Within the area of concern along the anterior right foot, there are multiple areas of complex fluid, largest visible pocket measuring approximately 1.4 x 0.8 cm and 2.2 x 0.7 cm. There is generalized subcutaneous edema and increased vascularity. IMPRESSION: Subcutaneous edema and increased vascularity as can be seen in cellulitis. Complex collections of fluid measuring up to 1.4 x 0.8 cm and 2.2 x 0.7 cm are seen. These could represent areas of localized lymphedema or phlegmon/developing abscesses. MRI with and without contrast may be useful for further characterization. Electronically Signed   By: Maurine Simmering M.D.   On: 07/05/2021 12:28        Scheduled Meds:  doxycycline  100 mg Oral Q12H   enoxaparin (LOVENOX) injection  40 mg Subcutaneous Q24H   fluticasone furoate-vilanterol  1 puff Inhalation Daily   levalbuterol  1.25 mg Nebulization QID   methylPREDNISolone (SOLU-MEDROL) injection  40 mg Intravenous Q12H   umeclidinium bromide  1 puff Inhalation Daily   Continuous Infusions:     LOS: 5 days    Time spent: 32 minutes    Barb Merino, MD [facility] Pager [phone]

## 2021-07-08 DIAGNOSIS — L03115 Cellulitis of right lower limb: Secondary | ICD-10-CM | POA: Diagnosis not present

## 2021-07-08 LAB — SARS CORONAVIRUS 2 (TAT 6-24 HRS): SARS Coronavirus 2: NEGATIVE

## 2021-07-08 MED ORDER — SALINE SPRAY 0.65 % NA SOLN
1.0000 | NASAL | Status: DC | PRN
  Administered 2021-07-08: 1 via NASAL
  Filled 2021-07-08: qty 44

## 2021-07-08 NOTE — Plan of Care (Signed)
  Problem: Education: Goal: Knowledge of General Education information will improve Description: Including pain rating scale, medication(s)/side effects and non-pharmacologic comfort measures Outcome: Progressing   Problem: Nutrition: Goal: Adequate nutrition will be maintained Outcome: Progressing   

## 2021-07-08 NOTE — Progress Notes (Signed)
PROGRESS NOTE    Valerie Gay  [id number] DOB: 06/22/1941 DOA: 07/01/2021 PCP: Kathyrn Lass, MD    Brief Narrative:  Patient with history of COPD, chronic hypoxemic respiratory failure on 3 L oxygen at home, previous cellulitis of the leg presented to the emergency department with complaints of right lower extremity pain redness and swelling for 2 days.  In the emergency room, hemodynamically stable.  Leukocytosis with WBC count 40,000.  Started on IV antibiotics and admitted to the hospital. Cellulitis significantly improved with localized collection on the right foot.  Leg symptoms improved but she developed severe COPD symptoms. Significant bronchospasm and COPD exacerbation.   Assessment & Plan:   Principal Problem:   Cellulitis of right lower extremity Active Problems:   COPD (chronic obstructive pulmonary disease) (HCC)   Nicotine abuse   Hypokalemia   Chronic respiratory failure with hypoxia (HCC)   Nicotine dependence, cigarettes, uncomplicated  Cellulitis of the right lower extremity: Admitted with severe extending cellulitis of right leg.  Clinically improving.  Treated with vancomycin and cefepime.  WBC normalizing. On doxycycline now.  Will complete 10 days of therapy. Fluctuant swelling dorsum of the right foot.  Ultrasound consistent with complex fluid. Seen by orthopedics.  Recommended conservative management with compression. Will treat with 10 days of total antibiotics.   COPD with chronic hypoxemic respiratory failure: Now with exacerbation. Patient does have severe COPD.  Using 3 L oxygen that she uses at home. Patient is on Dulera that she will continue.   Not treating with aggressive bronchodilator therapy, scheduled Xopenex, IV steroids.  Repeat chest x-ray was essentially normal.  Hypokalemia: Replaced and adequate.     DVT prophylaxis: enoxaparin (LOVENOX) injection 40 mg Start: 07/07/21 1000   Code Status: Full code Family  Communication: Daughter on the phone. Disposition Plan: Status is: Inpatient  Remains inpatient appropriate because:Inpatient level of care appropriate due to severity of illness  Dispo: The patient is from: Home              Anticipated d/c is to: Skilled nursing facility.  Anticipate tomorrow.              Patient currently is not medically stable to d/c.   Difficult to place patient No         Consultants:  Orthopedics  Procedures:  None   Antimicrobials:  Antibiotics Given (last 72 hours)     Date/Time Action Medication Dose   07/05/21 1729 Given   doxycycline (VIBRA-TABS) tablet 100 mg 100 mg   07/05/21 2055 Given   doxycycline (VIBRA-TABS) tablet 100 mg 100 mg   07/06/21 0853 Given   doxycycline (VIBRA-TABS) tablet 100 mg 100 mg   07/06/21 2128 Given   doxycycline (VIBRA-TABS) tablet 100 mg 100 mg   07/07/21 0924 Given   doxycycline (VIBRA-TABS) tablet 100 mg 100 mg   07/07/21 2226 Given   doxycycline (VIBRA-TABS) tablet 100 mg 100 mg   07/08/21 [id number] Given   doxycycline (VIBRA-TABS) tablet 100 mg 100 mg          Subjective: Patient seen and examined.  Foot is better.  Breathing is still problematic.  Looks more comfortable since yesterday.  Objective: Vitals:   07/07/21 2219 07/08/21 0451 07/08/21 0739 07/08/21 0815  BP: 130/81 125/65  131/61  Pulse:  88 (!) 101 (!) 103  Resp:  '20 18 20  '$ Temp: 98 F (36.7 C) 97.7 F (36.5 C)  97.8 F (36.6 C)  TempSrc: Oral Oral  Axillary  SpO2: 98% 100% 100%   Weight:      Height:       No intake or output data in the 24 hours ending 07/08/21 1346  Filed Weights   07/01/21 1923  Weight: 62 kg    Examination:  General: Chronically sick looking.  Frail and debilitated.  Not in any distress. Cardiovascular: S1-S2 normal.  Tachycardic. Respiratory: Poor bilateral air entry.  No added sounds.  Currently on 4 L oxygen. Gastrointestinal: Soft and nontender.  Bowel sounds present. Ext: Right foot with some  swelling of the dorsum of the foot, no fluctuant swelling today.  No redness or erythema. Neuro: Nonfocal.  Equal strength in all extremities.     Data Reviewed: I have personally reviewed following labs and imaging studies  CBC: Recent Labs  Lab 07/01/21 1954 07/02/21 0322 07/03/21 0111 07/05/21 0135 07/06/21 0304  WBC 40.1* 34.6* 22.6* 13.2* 12.1*  NEUTROABS 36.7* 31.0* 20.1*  --   --   HGB 10.8* 9.4* 8.2* 8.5* 7.9*  HCT 33.4* 29.7* 26.1* 26.3* 25.8*  MCV 99.1 101.7* 100.0 98.9 101.6*  PLT 513* 417* 429* 466* Q000111Q*   Basic Metabolic Panel: Recent Labs  Lab 07/01/21 1954 07/02/21 0322 07/03/21 0111 07/05/21 0135  NA 138 138 140 139  K 2.9* 3.7 3.5 3.3*  CL 95* 99 102 105  CO2 32 '29 28 28  '$ GLUCOSE 105* 92 92 113*  BUN 21 25* 28* 29*  CREATININE 1.03* 1.09* 1.13* 0.81  CALCIUM 9.0 8.6* 8.8* 8.7*  MG 2.1 2.0 2.3  --   PHOS  --   --  2.8  --    GFR: Estimated Creatinine Clearance: 46.4 mL/min (by C-G formula based on SCr of 0.81 mg/dL). Liver Function Tests: Recent Labs  Lab 07/01/21 1954 07/02/21 0322  AST 18 16  ALT 14 11  ALKPHOS 87 91  BILITOT 1.1 0.9  PROT 6.4* 5.4*  ALBUMIN 2.8* 2.3*   No results for input(s): LIPASE, AMYLASE in the last 168 hours. No results for input(s): AMMONIA in the last 168 hours. Coagulation Profile: No results for input(s): INR, PROTIME in the last 168 hours. Cardiac Enzymes: No results for input(s): CKTOTAL, CKMB, CKMBINDEX, TROPONINI in the last 168 hours. BNP (last 3 results) No results for input(s): PROBNP in the last 8760 hours. HbA1C: No results for input(s): HGBA1C in the last 72 hours. CBG: No results for input(s): GLUCAP in the last 168 hours. Lipid Profile: No results for input(s): CHOL, HDL, LDLCALC, TRIG, CHOLHDL, LDLDIRECT in the last 72 hours. Thyroid Function Tests: No results for input(s): TSH, T4TOTAL, FREET4, T3FREE, THYROIDAB in the last 72 hours. Anemia Panel: No results for input(s): VITAMINB12,  FOLATE, FERRITIN, TIBC, IRON, RETICCTPCT in the last 72 hours. Sepsis Labs: Recent Labs  Lab 07/02/21 0322  PROCALCITON 0.89  LATICACIDVEN 1.1    Recent Results (from the past 240 hour(s))  Resp Panel by RT-PCR (Flu A&B, Covid) Nasopharyngeal Swab     Status: None   Collection Time: 07/01/21  7:35 PM   Specimen: Nasopharyngeal Swab; Nasopharyngeal(NP) swabs in vial transport medium  Result Value Ref Range Status   SARS Coronavirus 2 by RT PCR NEGATIVE NEGATIVE Final    Comment: (NOTE) SARS-CoV-2 target nucleic acids are NOT DETECTED.  The SARS-CoV-2 RNA is generally detectable in upper respiratory specimens during the acute phase of infection. The lowest concentration of SARS-CoV-2 viral copies this assay can detect is 138 copies/mL. A negative result does not preclude SARS-Cov-2 infection and should  not be used as the sole basis for treatment or other patient management decisions. A negative result may occur with  improper specimen collection/handling, submission of specimen other than nasopharyngeal swab, presence of viral mutation(s) within the areas targeted by this assay, and inadequate number of viral copies(<138 copies/mL). A negative result must be combined with clinical observations, patient history, and epidemiological information. The expected result is Negative.  Fact Sheet for Patients:  EntrepreneurPulse.com.au  Fact Sheet for Healthcare Providers:  IncredibleEmployment.be  This test is no t yet approved or cleared by the [place] FDA and  has been authorized for detection and/or diagnosis of SARS-CoV-2 by FDA under an Emergency Use Authorization (EUA). This EUA will remain  in effect (meaning this test can be used) for the duration of the COVID-19 declaration under Section 564(b)(1) of the Act, 21 U.S.C.section 360bbb-3(b)(1), unless the authorization is terminated  or revoked sooner.       Influenza A by PCR  NEGATIVE NEGATIVE Final   Influenza B by PCR NEGATIVE NEGATIVE Final    Comment: (NOTE) The Xpert Xpress SARS-CoV-2/FLU/RSV plus assay is intended as an aid in the diagnosis of influenza from Nasopharyngeal swab specimens and should not be used as a sole basis for treatment. Nasal washings and aspirates are unacceptable for Xpert Xpress SARS-CoV-2/FLU/RSV testing.  Fact Sheet for Patients: EntrepreneurPulse.com.au  Fact Sheet for Healthcare Providers: IncredibleEmployment.be  This test is not yet approved or cleared by the [place] FDA and has been authorized for detection and/or diagnosis of SARS-CoV-2 by FDA under an Emergency Use Authorization (EUA). This EUA will remain in effect (meaning this test can be used) for the duration of the COVID-19 declaration under Section 564(b)(1) of the Act, 21 U.S.C. section 360bbb-3(b)(1), unless the authorization is terminated or revoked.  Performed at [facility], [place] [address] [place], [place] [address]   Blood culture (routine x 2)     Status: None   Collection Time: 07/01/21  7:54 PM   Specimen: BLOOD RIGHT ARM  Result Value Ref Range Status   Specimen Description BLOOD RIGHT ARM  Final   Special Requests   Final    BOTTLES DRAWN AEROBIC AND ANAEROBIC Blood Culture results may not be optimal due to an excessive volume of blood received in culture bottles   Culture   Final    NO GROWTH 5 DAYS Performed at [facility], [place] [address], [place], [place] [address]    Report Status 07/06/2021 FINAL  Final  Blood culture (routine x 2)     Status: None   Collection Time: 07/01/21  7:55 PM   Specimen: BLOOD RIGHT HAND  Result Value Ref Range Status   Specimen Description BLOOD RIGHT HAND  Final   Special Requests   Final    BOTTLES DRAWN AEROBIC ONLY Blood Culture adequate volume   Culture   Final    NO GROWTH 5 DAYS Performed at [facility], [place] [address],  [place],  [address]    Report Status 07/06/2021 FINAL  Final         Radiology Studies: DG CHEST PORT 1 VIEW  Result Date: 07/07/2021 CLINICAL DATA:  COPD with acute exacerbation EXAM: PORTABLE CHEST 1 VIEW COMPARISON:  07/02/2021 FINDINGS: Heart size and mediastinal contours appear stable. Lungs are hyperinflated and there are diffuse coarsened interstitial markings of COPD/emphysema. No superimposed airspace consolidation identified. Curvature of the thoracic spine appears convex towards the right. IMPRESSION: 1. Stable COPD/emphysema. 2. No  acute cardiopulmonary abnormalities. Electronically Signed   By: Kerby Moors M.D.   On: 07/07/2021 15:39        Scheduled Meds:  doxycycline  100 mg Oral Q12H   enoxaparin (LOVENOX) injection  40 mg Subcutaneous Q24H   fluticasone furoate-vilanterol  1 puff Inhalation Daily   levalbuterol  1.25 mg Nebulization QID   methylPREDNISolone (SOLU-MEDROL) injection  40 mg Intravenous Q12H   umeclidinium bromide  1 puff Inhalation Daily   Continuous Infusions:     LOS: 6 days    Time spent: 32 minutes    Barb Merino, MD [facility] Pager [phone]

## 2021-07-09 DIAGNOSIS — Y95 Nosocomial condition: Secondary | ICD-10-CM | POA: Diagnosis present

## 2021-07-09 DIAGNOSIS — N179 Acute kidney failure, unspecified: Secondary | ICD-10-CM | POA: Diagnosis present

## 2021-07-09 DIAGNOSIS — Z7401 Bed confinement status: Secondary | ICD-10-CM | POA: Diagnosis not present

## 2021-07-09 DIAGNOSIS — Z515 Encounter for palliative care: Secondary | ICD-10-CM | POA: Diagnosis not present

## 2021-07-09 DIAGNOSIS — K5900 Constipation, unspecified: Secondary | ICD-10-CM | POA: Diagnosis not present

## 2021-07-09 DIAGNOSIS — I959 Hypotension, unspecified: Secondary | ICD-10-CM | POA: Diagnosis not present

## 2021-07-09 DIAGNOSIS — J90 Pleural effusion, not elsewhere classified: Secondary | ICD-10-CM | POA: Diagnosis not present

## 2021-07-09 DIAGNOSIS — J189 Pneumonia, unspecified organism: Secondary | ICD-10-CM | POA: Diagnosis present

## 2021-07-09 DIAGNOSIS — J9622 Acute and chronic respiratory failure with hypercapnia: Secondary | ICD-10-CM | POA: Diagnosis present

## 2021-07-09 DIAGNOSIS — R64 Cachexia: Secondary | ICD-10-CM | POA: Diagnosis present

## 2021-07-09 DIAGNOSIS — K219 Gastro-esophageal reflux disease without esophagitis: Secondary | ICD-10-CM | POA: Diagnosis not present

## 2021-07-09 DIAGNOSIS — R600 Localized edema: Secondary | ICD-10-CM | POA: Diagnosis not present

## 2021-07-09 DIAGNOSIS — Z8719 Personal history of other diseases of the digestive system: Secondary | ICD-10-CM | POA: Diagnosis not present

## 2021-07-09 DIAGNOSIS — E43 Unspecified severe protein-calorie malnutrition: Secondary | ICD-10-CM | POA: Diagnosis present

## 2021-07-09 DIAGNOSIS — Z820 Family history of epilepsy and other diseases of the nervous system: Secondary | ICD-10-CM | POA: Diagnosis not present

## 2021-07-09 DIAGNOSIS — E872 Acidosis: Secondary | ICD-10-CM | POA: Diagnosis not present

## 2021-07-09 DIAGNOSIS — R6521 Severe sepsis with septic shock: Secondary | ICD-10-CM | POA: Diagnosis present

## 2021-07-09 DIAGNOSIS — Z720 Tobacco use: Secondary | ICD-10-CM | POA: Diagnosis not present

## 2021-07-09 DIAGNOSIS — R000 Tachycardia, unspecified: Secondary | ICD-10-CM | POA: Diagnosis not present

## 2021-07-09 DIAGNOSIS — R2681 Unsteadiness on feet: Secondary | ICD-10-CM | POA: Diagnosis not present

## 2021-07-09 DIAGNOSIS — Z66 Do not resuscitate: Secondary | ICD-10-CM | POA: Diagnosis present

## 2021-07-09 DIAGNOSIS — E785 Hyperlipidemia, unspecified: Secondary | ICD-10-CM | POA: Diagnosis present

## 2021-07-09 DIAGNOSIS — R262 Difficulty in walking, not elsewhere classified: Secondary | ICD-10-CM | POA: Diagnosis not present

## 2021-07-09 DIAGNOSIS — D649 Anemia, unspecified: Secondary | ICD-10-CM | POA: Diagnosis present

## 2021-07-09 DIAGNOSIS — M6281 Muscle weakness (generalized): Secondary | ICD-10-CM | POA: Diagnosis not present

## 2021-07-09 DIAGNOSIS — I469 Cardiac arrest, cause unspecified: Secondary | ICD-10-CM | POA: Diagnosis present

## 2021-07-09 DIAGNOSIS — R627 Adult failure to thrive: Secondary | ICD-10-CM | POA: Diagnosis present

## 2021-07-09 DIAGNOSIS — R0602 Shortness of breath: Secondary | ICD-10-CM | POA: Diagnosis not present

## 2021-07-09 DIAGNOSIS — Z800 Family history of malignant neoplasm of digestive organs: Secondary | ICD-10-CM | POA: Diagnosis not present

## 2021-07-09 DIAGNOSIS — I509 Heart failure, unspecified: Secondary | ICD-10-CM | POA: Diagnosis not present

## 2021-07-09 DIAGNOSIS — Z833 Family history of diabetes mellitus: Secondary | ICD-10-CM | POA: Diagnosis not present

## 2021-07-09 DIAGNOSIS — Z741 Need for assistance with personal care: Secondary | ICD-10-CM | POA: Diagnosis not present

## 2021-07-09 DIAGNOSIS — L03116 Cellulitis of left lower limb: Secondary | ICD-10-CM | POA: Diagnosis not present

## 2021-07-09 DIAGNOSIS — Z9981 Dependence on supplemental oxygen: Secondary | ICD-10-CM | POA: Diagnosis not present

## 2021-07-09 DIAGNOSIS — F17210 Nicotine dependence, cigarettes, uncomplicated: Secondary | ICD-10-CM | POA: Diagnosis present

## 2021-07-09 DIAGNOSIS — Z20822 Contact with and (suspected) exposure to covid-19: Secondary | ICD-10-CM | POA: Diagnosis present

## 2021-07-09 DIAGNOSIS — J9611 Chronic respiratory failure with hypoxia: Secondary | ICD-10-CM | POA: Diagnosis not present

## 2021-07-09 DIAGNOSIS — I491 Atrial premature depolarization: Secondary | ICD-10-CM | POA: Diagnosis not present

## 2021-07-09 DIAGNOSIS — L0390 Cellulitis, unspecified: Secondary | ICD-10-CM | POA: Diagnosis not present

## 2021-07-09 DIAGNOSIS — R0689 Other abnormalities of breathing: Secondary | ICD-10-CM | POA: Diagnosis not present

## 2021-07-09 DIAGNOSIS — L03115 Cellulitis of right lower limb: Secondary | ICD-10-CM | POA: Diagnosis not present

## 2021-07-09 DIAGNOSIS — R570 Cardiogenic shock: Secondary | ICD-10-CM | POA: Diagnosis present

## 2021-07-09 DIAGNOSIS — R5381 Other malaise: Secondary | ICD-10-CM | POA: Diagnosis not present

## 2021-07-09 DIAGNOSIS — A419 Sepsis, unspecified organism: Secondary | ICD-10-CM | POA: Diagnosis present

## 2021-07-09 DIAGNOSIS — J440 Chronic obstructive pulmonary disease with acute lower respiratory infection: Secondary | ICD-10-CM | POA: Diagnosis present

## 2021-07-09 DIAGNOSIS — I2510 Atherosclerotic heart disease of native coronary artery without angina pectoris: Secondary | ICD-10-CM | POA: Diagnosis present

## 2021-07-09 DIAGNOSIS — I872 Venous insufficiency (chronic) (peripheral): Secondary | ICD-10-CM | POA: Diagnosis not present

## 2021-07-09 DIAGNOSIS — J449 Chronic obstructive pulmonary disease, unspecified: Secondary | ICD-10-CM | POA: Diagnosis not present

## 2021-07-09 DIAGNOSIS — G9341 Metabolic encephalopathy: Secondary | ICD-10-CM | POA: Diagnosis present

## 2021-07-09 DIAGNOSIS — R54 Age-related physical debility: Secondary | ICD-10-CM | POA: Diagnosis present

## 2021-07-09 DIAGNOSIS — I4891 Unspecified atrial fibrillation: Secondary | ICD-10-CM | POA: Diagnosis present

## 2021-07-09 DIAGNOSIS — J9621 Acute and chronic respiratory failure with hypoxia: Secondary | ICD-10-CM | POA: Diagnosis present

## 2021-07-09 DIAGNOSIS — R419 Unspecified symptoms and signs involving cognitive functions and awareness: Secondary | ICD-10-CM | POA: Diagnosis not present

## 2021-07-09 DIAGNOSIS — R0902 Hypoxemia: Secondary | ICD-10-CM | POA: Diagnosis not present

## 2021-07-09 DIAGNOSIS — J439 Emphysema, unspecified: Secondary | ICD-10-CM | POA: Diagnosis not present

## 2021-07-09 MED ORDER — PREDNISONE 10 MG PO TABS: ORAL_TABLET | ORAL | 0 refills | Status: AC

## 2021-07-09 MED ORDER — LEVALBUTEROL HCL 1.25 MG/0.5ML IN NEBU: 1.2500 mg | INHALATION_SOLUTION | Freq: Four times a day (QID) | RESPIRATORY_TRACT | 12 refills | Status: AC

## 2021-07-09 MED ORDER — POLYETHYLENE GLYCOL 3350 17 G PO PACK: 17.0000 g | PACK | Freq: Every day | ORAL | 0 refills | Status: AC | PRN

## 2021-07-09 MED ORDER — DOXYCYCLINE HYCLATE 100 MG PO TABS: 100.0000 mg | ORAL_TABLET | Freq: Two times a day (BID) | ORAL | 0 refills | Status: AC

## 2021-07-09 NOTE — Discharge Summary (Signed)
Physician Discharge Summary  Valerie Gay [facility] [id number] DOB: 1941/08/19 DOA: 07/01/2021  PCP: Kathyrn Lass, MD  Admit date: 07/01/2021 Discharge date: 07/09/2021  Admitted From: Home Disposition: Skilled nursing facility  Recommendations for Outpatient Follow-up:  Follow up with PCP in 1-2 weeks after discharge Schedule follow-up with orthopedics in 2 weeks.  Home Health: Not applicable Equipment/Devices: Not applicable.  Discharge Condition: Stable CODE STATUS: Full code Diet recommendation: Low-salt diet  Discharge summary: Patient with history of COPD, chronic hypoxemic respiratory failure on 3 L oxygen at home, previous cellulitis of the leg presented to the emergency department with complaints of right lower extremity pain redness and swelling for 2 days.  In the emergency room, hemodynamically stable.  Leukocytosis with WBC count 40,000.  Started on IV antibiotics and admitted to the hospital. Cellulitis significantly improved with localized collection on the right foot.  Leg symptoms improved but she developed severe COPD symptoms so she stayed in the hospital.  Is stabilizing now.  Treated for following conditions.  Cellulitis of the right lower extremity: Admitted with severe extending cellulitis of right leg.  Clinically improved.  Initially treated with vancomycin and cefepime.  Changed to doxycycline, total 10 days of therapy.  2 more days of doxycycline.  Fluctuant swelling dorsum of the right foot.  Ultrasound consistent with complex fluid. Seen by orthopedics.  Recommended conservative management with compression. Will treat with 10 days of total antibiotics.  Weightbearing as tolerated.  Encourage mobility.   COPD with chronic hypoxemic respiratory failure: COPD with exacerbation. Patient does have severe COPD.  Using 3 L oxygen that she uses at home. Patient is on Dulera that she will continue.   Treated with aggressive bronchodilator therapy and chest  physiotherapy, and Xopenex to avoid tachycardia.  Also treated with IV steroids.  Clinical improvement today. She will be discharged with albuterol as needed, Xopenex every 6 hours until clinical improvement.  She will take prolonged steroid taper for next 12 days as prescribed. May need 3 to 4 L of oxygen or more on mobility to keep saturations more than 90%.   Hypokalemia: Replaced and adequate.  Medically stabilized and able to transfer to skilled nursing facility level of care today.   Discharge Diagnoses:  Principal Problem:   Cellulitis of right lower extremity Active Problems:   COPD (chronic obstructive pulmonary disease) (HCC)   Nicotine abuse   Hypokalemia   Chronic respiratory failure with hypoxia (HCC)   Nicotine dependence, cigarettes, uncomplicated    Discharge Instructions  Discharge Instructions     Call MD for:  difficulty breathing, headache or visual disturbances   Complete by: As directed    Call MD for:  severe uncontrolled pain   Complete by: As directed    Diet - low sodium heart healthy   Complete by: As directed    Increase activity slowly   Complete by: As directed       Allergies as of 07/09/2021       Reactions   Spiriva Handihaler [tiotropium Bromide Monohydrate] Hives, Itching, Rash    on upper body   Banana Itching        Medication List     STOP taking these medications    UNKNOWN TO PATIENT       TAKE these medications    acetaminophen 325 MG tablet Commonly known as: TYLENOL Take 2 tablets (650 mg total) by mouth every 6 (six) hours as needed for mild pain (or Fever >/= 101).   albuterol 108 (90  Base) MCG/ACT inhaler Commonly known as: VENTOLIN HFA Inhale 2 puffs into the lungs every 6 (six) hours as needed for wheezing or shortness of breath.   albuterol (2.5 MG/3ML) 0.083% nebulizer solution Commonly known as: PROVENTIL Take 3 mLs (2.5 mg total) by nebulization every 8 (eight) hours as needed for wheezing.    Breztri Aerosphere 160-9-4.8 MCG/ACT Aero Generic drug: Budeson-Glycopyrrol-Formoterol Inhale 2 puffs into the lungs in the morning and at bedtime.   doxycycline 100 MG tablet Commonly known as: VIBRA-TABS Take 1 tablet (100 mg total) by mouth every 12 (twelve) hours for 2 days.   furosemide 40 MG tablet Commonly known as: LASIX TAKE 1 TABLET(40 MG) BY MOUTH DAILY.may take an additional 40 mg if need for increase swelling or shortness of breath What changed:  how much to take how to take this when to take this additional instructions   hydroxypropyl methylcellulose / hypromellose 2.5 % ophthalmic solution Commonly known as: ISOPTO TEARS / GONIOVISC Place 2 drops into both eyes 3 (three) times daily as needed for dry eyes.   ibuprofen 200 MG tablet Commonly known as: ADVIL Take 400 mg by mouth every 6 (six) hours as needed for mild pain.   levalbuterol 1.25 MG/0.5ML nebulizer solution Commonly known as: XOPENEX Take 1.25 mg by nebulization 4 (four) times daily.   OXYGEN Inhale 3 L into the lungs continuous.   polyethylene glycol 17 g packet Commonly known as: MIRALAX / GLYCOLAX Take 17 g by mouth daily as needed for mild constipation.   predniSONE 10 MG tablet Commonly known as: DELTASONE 4 tabs daily for 3 days 3 tabs daily for 3 days 2 tabs daily for 3 days 1 tabs daily for 3 days   sodium chloride 0.65 % nasal spray Commonly known as: OCEAN Place 1 spray into the nose 6 (six) times daily.        Follow-up Information     Wylene Simmer, MD. Schedule an appointment as soon as possible for a visit in 2 week(s).   Specialty: Orthopedic Surgery Contact information: [address] [place] [address] [phone]                Allergies  Allergen Reactions   Spiriva Handihaler [Tiotropium Bromide Monohydrate] Hives, Itching and Rash     on upper body   Banana Itching    Consultations: Orthopedics   Procedures/Studies: DG CHEST  PORT 1 VIEW  Result Date: 07/07/2021 CLINICAL DATA:  COPD with acute exacerbation EXAM: PORTABLE CHEST 1 VIEW COMPARISON:  07/02/2021 FINDINGS: Heart size and mediastinal contours appear stable. Lungs are hyperinflated and there are diffuse coarsened interstitial markings of COPD/emphysema. No superimposed airspace consolidation identified. Curvature of the thoracic spine appears convex towards the right. IMPRESSION: 1. Stable COPD/emphysema. 2. No acute cardiopulmonary abnormalities. Electronically Signed   By: Kerby Moors M.D.   On: 07/07/2021 15:39   DG Chest Port 1 View  Result Date: 07/02/2021 CLINICAL DATA:  Shortness of breath EXAM: PORTABLE CHEST 1 VIEW COMPARISON:  12/08/2018 FINDINGS: Lungs are clear.  No pleural effusion or pneumothorax. The heart is normal in size.  Thoracic aortic atherosclerosis. IMPRESSION: No evidence of acute cardiopulmonary disease. Electronically Signed   By: Julian Hy M.D.   On: 07/02/2021 00:17   [place] RT LOWER EXTREM LTD SOFT TISSUE NON VASCULAR  Result Date: 07/05/2021 CLINICAL DATA:  Right foot swelling, eval for fluid EXAM: ULTRASOUND right LOWER EXTREMITY LIMITED TECHNIQUE: Ultrasound examination of the lower extremity soft tissues was performed  in the area of clinical concern. COMPARISON:  None. FINDINGS: Within the area of concern along the anterior right foot, there are multiple areas of complex fluid, largest visible pocket measuring approximately 1.4 x 0.8 cm and 2.2 x 0.7 cm. There is generalized subcutaneous edema and increased vascularity. IMPRESSION: Subcutaneous edema and increased vascularity as can be seen in cellulitis. Complex collections of fluid measuring up to 1.4 x 0.8 cm and 2.2 x 0.7 cm are seen. These could represent areas of localized lymphedema or phlegmon/developing abscesses. MRI with and without contrast may be useful for further characterization. Electronically Signed   By: Maurine Simmering M.D.   On: 07/05/2021 12:28   (Echo,  Carotid, EGD, Colonoscopy, ERCP)    Subjective: Patient seen and examined.  No overnight events.  Some shortness of breath but mostly improved since yesterday.   Discharge Exam: Vitals:   07/09/21 0727 07/09/21 0730  BP:    Pulse:    Resp:    Temp:    SpO2: 99% 99%   Vitals:   07/08/21 2115 07/09/21 0629 07/09/21 0727 07/09/21 0730  BP: 123/76 126/74    Pulse: (!) 106 94    Resp: 16 16    Temp: 98.1 F (36.7 C) 98 F (36.7 C)    TempSrc: Oral Oral    SpO2: 98% 99% 99% 99%  Weight:      Height:        General: Pt is alert, awake, not in acute distress Patient is on 3 to 4 L of oxygen and looks comfortable.  Frail and debilitated.  Appropriate for age.  Not in any distress. Cardiovascular: RRR, S1/S2 +, no rubs, no gallops Respiratory: CTA bilaterally, conducted upper airway sounds.  Occasional scattered wheezes on expiration. Abdominal: Soft, NT, ND, bowel sounds + Extremities: 1+ edema dorsum of the right foot, compression bandage on.  No redness or erythema.    The results of significant diagnostics from this hospitalization (including imaging, microbiology, ancillary and laboratory) are listed below for reference.     Microbiology: Recent Results (from the past 240 hour(s))  Resp Panel by RT-PCR (Flu A&B, Covid) Nasopharyngeal Swab     Status: None   Collection Time: 07/01/21  7:35 PM   Specimen: Nasopharyngeal Swab; Nasopharyngeal(NP) swabs in vial transport medium  Result Value Ref Range Status   SARS Coronavirus 2 by RT PCR NEGATIVE NEGATIVE Final    Comment: (NOTE) SARS-CoV-2 target nucleic acids are NOT DETECTED.  The SARS-CoV-2 RNA is generally detectable in upper respiratory specimens during the acute phase of infection. The lowest concentration of SARS-CoV-2 viral copies this assay can detect is 138 copies/mL. A negative result does not preclude SARS-Cov-2 infection and should not be used as the sole basis for treatment or other patient management  decisions. A negative result may occur with  improper specimen collection/handling, submission of specimen other than nasopharyngeal swab, presence of viral mutation(s) within the areas targeted by this assay, and inadequate number of viral copies(<138 copies/mL). A negative result must be combined with clinical observations, patient history, and epidemiological information. The expected result is Negative.  Fact Sheet for Patients:  EntrepreneurPulse.com.au  Fact Sheet for Healthcare Providers:  IncredibleEmployment.be  This test is no t yet approved or cleared by the [place] FDA and  has been authorized for detection and/or diagnosis of SARS-CoV-2 by FDA under an Emergency Use Authorization (EUA). This EUA will remain  in effect (meaning this test can be used) for the duration of the COVID-19  declaration under Section 564(b)(1) of the Act, 21 U.S.C.section 360bbb-3(b)(1), unless the authorization is terminated  or revoked sooner.       Influenza A by PCR NEGATIVE NEGATIVE Final   Influenza B by PCR NEGATIVE NEGATIVE Final    Comment: (NOTE) The Xpert Xpress SARS-CoV-2/FLU/RSV plus assay is intended as an aid in the diagnosis of influenza from Nasopharyngeal swab specimens and should not be used as a sole basis for treatment. Nasal washings and aspirates are unacceptable for Xpert Xpress SARS-CoV-2/FLU/RSV testing.  Fact Sheet for Patients: EntrepreneurPulse.com.au  Fact Sheet for Healthcare Providers: IncredibleEmployment.be  This test is not yet approved or cleared by the [place] FDA and has been authorized for detection and/or diagnosis of SARS-CoV-2 by FDA under an Emergency Use Authorization (EUA). This EUA will remain in effect (meaning this test can be used) for the duration of the COVID-19 declaration under Section 564(b)(1) of the Act, 21 U.S.C. section 360bbb-3(b)(1), unless the  authorization is terminated or revoked.  Performed at [facility], [place] [address], [place], [place] [address]   Blood culture (routine x 2)     Status: None   Collection Time: 07/01/21  7:54 PM   Specimen: BLOOD RIGHT ARM  Result Value Ref Range Status   Specimen Description BLOOD RIGHT ARM  Final   Special Requests   Final    BOTTLES DRAWN AEROBIC AND ANAEROBIC Blood Culture results may not be optimal due to an excessive volume of blood received in culture bottles   Culture   Final    NO GROWTH 5 DAYS Performed at [facility], [place] [address], [place], [place] [address]    Report Status 07/06/2021 FINAL  Final  Blood culture (routine x 2)     Status: None   Collection Time: 07/01/21  7:55 PM   Specimen: BLOOD RIGHT HAND  Result Value Ref Range Status   Specimen Description BLOOD RIGHT HAND  Final   Special Requests   Final    BOTTLES DRAWN AEROBIC ONLY Blood Culture adequate volume   Culture   Final    NO GROWTH 5 DAYS Performed at [facility], [place] [address], [place], [place] [address]    Report Status 07/06/2021 FINAL  Final  SARS CORONAVIRUS 2 (TAT 6-24 HRS) Nasopharyngeal Nasopharyngeal Swab     Status: None   Collection Time: 07/08/21 12:39 PM   Specimen: Nasopharyngeal Swab  Result Value Ref Range Status   SARS Coronavirus 2 NEGATIVE NEGATIVE Final    Comment: (NOTE) SARS-CoV-2 target nucleic acids are NOT DETECTED.  The SARS-CoV-2 RNA is generally detectable in upper and lower respiratory specimens during the acute phase of infection. Negative results do not preclude SARS-CoV-2 infection, do not rule out co-infections with other pathogens, and should not be used as the sole basis for treatment or other patient management decisions. Negative results must be combined with clinical observations, patient history, and epidemiological information. The expected result is Negative.  Fact Sheet for  Patients: SugarRoll.be  Fact Sheet for Healthcare Providers: https://www.woods-mathews.com/  This test is not yet approved or cleared by the [place] FDA and  has been authorized for detection and/or diagnosis of SARS-CoV-2 by FDA under an Emergency Use Authorization (EUA). This EUA will remain  in effect (meaning this test can be used) for the duration of the COVID-19 declaration under Se ction 564(b)(1) of the Act, 21 U.S.C. section 360bbb-3(b)(1), unless the authorization is terminated or revoked sooner.  Performed at [facility], [place] [address] [place], [place] [address]      Labs: BNP (last 3 results) No results for input(s): BNP in the last 8760 hours. Basic Metabolic Panel: Recent Labs  Lab 07/03/21 0111 07/05/21 0135  NA 140 139  K 3.5 3.3*  CL 102 105  CO2 28 28  GLUCOSE 92 113*  BUN 28* 29*  CREATININE 1.13* 0.81  CALCIUM 8.8* 8.7*  MG 2.3  --   PHOS 2.8  --    Liver Function Tests: No results for input(s): AST, ALT, ALKPHOS, BILITOT, PROT, ALBUMIN in the last 168 hours. No results for input(s): LIPASE, AMYLASE in the last 168 hours. No results for input(s): AMMONIA in the last 168 hours. CBC: Recent Labs  Lab 07/03/21 0111 07/05/21 0135 07/06/21 0304  WBC 22.6* 13.2* 12.1*  NEUTROABS 20.1*  --   --   HGB 8.2* 8.5* 7.9*  HCT 26.1* 26.3* 25.8*  MCV 100.0 98.9 101.6*  PLT 429* 466* 505*   Cardiac Enzymes: No results for input(s): CKTOTAL, CKMB, CKMBINDEX, TROPONINI in the last 168 hours. BNP: Invalid input(s): POCBNP CBG: No results for input(s): GLUCAP in the last 168 hours. D-Dimer No results for input(s): DDIMER in the last 72 hours. Hgb A1c No results for input(s): HGBA1C in the last 72 hours. Lipid Profile No results for input(s): CHOL, HDL, LDLCALC, TRIG, CHOLHDL, LDLDIRECT in the last 72 hours. Thyroid function studies No results for input(s): TSH, T4TOTAL, T3FREE, THYROIDAB in the  last 72 hours.  Invalid input(s): FREET3 Anemia work up No results for input(s): VITAMINB12, FOLATE, FERRITIN, TIBC, IRON, RETICCTPCT in the last 72 hours. Urinalysis    Component Value Date/Time   COLORURINE YELLOW 04/13/2017 1215   APPEARANCEUR HAZY (A) 04/13/2017 1215   LABSPEC 1.025 04/13/2017 1215   PHURINE 5.0 04/13/2017 1215   GLUCOSEU NEGATIVE 04/13/2017 1215   HGBUR SMALL (A) 04/13/2017 1215   BILIRUBINUR NEGATIVE 04/13/2017 1215   KETONESUR 20 (A) 04/13/2017 1215   PROTEINUR 30 (A) 04/13/2017 1215   UROBILINOGEN 1.0 03/29/2014 1032   NITRITE NEGATIVE 04/13/2017 1215   LEUKOCYTESUR NEGATIVE 04/13/2017 1215   Sepsis Labs Invalid input(s): PROCALCITONIN,  WBC,  LACTICIDVEN Microbiology Recent Results (from the past 240 hour(s))  Resp Panel by RT-PCR (Flu A&B, Covid) Nasopharyngeal Swab     Status: None   Collection Time: 07/01/21  7:35 PM   Specimen: Nasopharyngeal Swab; Nasopharyngeal(NP) swabs in vial transport medium  Result Value Ref Range Status   SARS Coronavirus 2 by RT PCR NEGATIVE NEGATIVE Final    Comment: (NOTE) SARS-CoV-2 target nucleic acids are NOT DETECTED.  The SARS-CoV-2 RNA is generally detectable in upper respiratory specimens during the acute phase of infection. The lowest concentration of SARS-CoV-2 viral copies this assay can detect is 138 copies/mL. A negative result does not preclude SARS-Cov-2 infection and should not be used as the sole basis for treatment or other patient management decisions. A negative result may occur with  improper specimen collection/handling, submission of specimen other than nasopharyngeal swab, presence of viral mutation(s) within the areas targeted by this assay, and inadequate number of viral copies(<138 copies/mL). A negative result must be combined with clinical observations, patient history, and epidemiological information. The expected result is Negative.  Fact Sheet for Patients:   EntrepreneurPulse.com.au  Fact Sheet for Healthcare Providers:  IncredibleEmployment.be  This test is no t yet approved or cleared by the [place] FDA and  has been authorized for detection and/or diagnosis of SARS-CoV-2 by  FDA under an Emergency Use Authorization (EUA). This EUA will remain  in effect (meaning this test can be used) for the duration of the COVID-19 declaration under Section 564(b)(1) of the Act, 21 U.S.C.section 360bbb-3(b)(1), unless the authorization is terminated  or revoked sooner.       Influenza A by PCR NEGATIVE NEGATIVE Final   Influenza B by PCR NEGATIVE NEGATIVE Final    Comment: (NOTE) The Xpert Xpress SARS-CoV-2/FLU/RSV plus assay is intended as an aid in the diagnosis of influenza from Nasopharyngeal swab specimens and should not be used as a sole basis for treatment. Nasal washings and aspirates are unacceptable for Xpert Xpress SARS-CoV-2/FLU/RSV testing.  Fact Sheet for Patients: EntrepreneurPulse.com.au  Fact Sheet for Healthcare Providers: IncredibleEmployment.be  This test is not yet approved or cleared by the [place] FDA and has been authorized for detection and/or diagnosis of SARS-CoV-2 by FDA under an Emergency Use Authorization (EUA). This EUA will remain in effect (meaning this test can be used) for the duration of the COVID-19 declaration under Section 564(b)(1) of the Act, 21 U.S.C. section 360bbb-3(b)(1), unless the authorization is terminated or revoked.  Performed at [facility], [place] [address] [place], [place] [address]   Blood culture (routine x 2)     Status: None   Collection Time: 07/01/21  7:54 PM   Specimen: BLOOD RIGHT ARM  Result Value Ref Range Status   Specimen Description BLOOD RIGHT ARM  Final   Special Requests   Final    BOTTLES DRAWN AEROBIC AND ANAEROBIC Blood Culture results may not be optimal due to an excessive  volume of blood received in culture bottles   Culture   Final    NO GROWTH 5 DAYS Performed at [facility], [place] [address], [place], [place] [address]    Report Status 07/06/2021 FINAL  Final  Blood culture (routine x 2)     Status: None   Collection Time: 07/01/21  7:55 PM   Specimen: BLOOD RIGHT HAND  Result Value Ref Range Status   Specimen Description BLOOD RIGHT HAND  Final   Special Requests   Final    BOTTLES DRAWN AEROBIC ONLY Blood Culture adequate volume   Culture   Final    NO GROWTH 5 DAYS Performed at [facility], [place] [address] [place],  [address]    Report Status 07/06/2021 FINAL  Final  SARS CORONAVIRUS 2 (TAT 6-24 HRS) Nasopharyngeal Nasopharyngeal Swab     Status: None   Collection Time: 07/08/21 12:39 PM   Specimen: Nasopharyngeal Swab  Result Value Ref Range Status   SARS Coronavirus 2 NEGATIVE NEGATIVE Final    Comment: (NOTE) SARS-CoV-2 target nucleic acids are NOT DETECTED.  The SARS-CoV-2 RNA is generally detectable in upper and lower respiratory specimens during the acute phase of infection. Negative results do not preclude SARS-CoV-2 infection, do not rule out co-infections with other pathogens, and should not be used as the sole basis for treatment or other patient management decisions. Negative results must be combined with clinical observations, patient history, and epidemiological information. The expected result is Negative.  Fact Sheet for Patients: SugarRoll.be  Fact Sheet for Healthcare Providers: https://www.woods-mathews.com/  This test is not yet approved or cleared by the [place] FDA and  has been authorized for detection and/or diagnosis of SARS-CoV-2 by FDA under an Emergency Use Authorization (EUA). This EUA will remain  in effect (meaning this test can be used) for the duration of the  COVID-19 declaration under Se ction 564(b)(1) of the Act, 21 U.S.C. section  360bbb-3(b)(1), unless the authorization is terminated or revoked sooner.  Performed at [facility], [place] [address] [place], [place] [address]      Time coordinating discharge:  35 minutes  SIGNED:   Barb Merino, MD  [facility] 07/09/2021, 10:55 AM

## 2021-07-09 NOTE — Progress Notes (Signed)
Physical Therapy Treatment Patient Details Name: Valerie Gay MRN: [id number] DOB: 1941/09/05 Today's Date: 07/09/2021    History of Present Illness 80 year old female with past medical history of COPD, chronic respiratory failure (on 3LPM via [place]), several hospitalizations for cellulitis in the past who presented to [facility] emergency department 07/01/21 with complaints of right lower extremity pain redness and swelling. +cellulitis;    PT Comments    Pt admitted with above diagnosis. Pt was able to stand with min to mod assist and take a few steps to [facility].  Refused to do anything else.   Pt currently with functional limitations due to balance and endurance deficits. Pt will benefit from skilled PT to increase their independence and safety with mobility to allow discharge to the venue listed below.      Follow Up Recommendations  SNF     Equipment Recommendations  None recommended by PT    Recommendations for Other Services OT consult     Precautions / Restrictions Precautions Precautions: Fall Precaution Comments: fell recently trying to use her foot to get something out from under sofa Restrictions RLE Weight Bearing: Weight bearing as tolerated    Mobility  Bed Mobility Overal bed mobility: Needs Assistance Bed Mobility: Supine to Sit   Sidelying to sit: Min guard;HOB elevated (with rail) Supine to sit: Min guard   Sit to sidelying: Min guard;HOB elevated (with rail) General bed mobility comments: Pt able to come to EOB with cues and a little assist for scooting.  Pt with 3LO2 on entire treatment.    Transfers Overall transfer level: Needs assistance Equipment used: Rolling walker (2 wheeled) Transfers: Sit to/from [facility] Sit to Stand: Mod assist;From elevated surface         General transfer comment: Pt stood to RW with mod assist for power up. Pt was able to weight bear on feet and take a few side steps to [facility]. Pt needed  cues for safe sitting as she wanted to sit abruptly due to fatigue and pain right LE. Was able to take a little weight on Rw and on right LE today to step.Pt refused to sit in chair and insisted on lying back down.  Ambulation/Gait                 Stairs             Wheelchair Mobility    Modified Rankin (Stroke Patients Only)       Balance Overall balance assessment: Needs assistance Sitting-balance support: No upper extremity supported;Feet supported Sitting balance-Leahy Scale: Fair     Standing balance support: Bilateral upper extremity supported;During functional activity Standing balance-Leahy Scale: Poor Standing balance comment: relies on UE support as well as external support                            Cognition Arousal/Alertness: Awake/alert Behavior During Therapy: WFL for tasks assessed/performed Overall Cognitive Status: Within Functional Limits for tasks assessed                                        Exercises General Exercises - Upper Extremity Shoulder Flexion:  (with ant/post shoulder circles) General Exercises - Lower Extremity Ankle Circles/Pumps: AROM;Both;10 reps;Supine Heel Slides: AROM;Both;10 reps;Supine    General Comments        Pertinent Vitals/Pain Pain Assessment: Faces  Faces Pain Scale: Hurts even more Pain Location: rt foot Pain Descriptors / Indicators: Burning;Grimacing;Throbbing Pain Intervention(s): Limited activity within patient's tolerance;Monitored during session;Repositioned    Home Living                      Prior Function            PT Goals (current goals can now be found in the care plan section) Acute Rehab PT Goals Patient Stated Goal: for leg to get better and be able to go to her home Progress towards PT goals: Progressing toward goals    Frequency    Min 2X/week      PT Plan Current plan remains appropriate    Co-evaluation               AM-PAC PT "6 Clicks" Mobility   Outcome Measure  Help needed turning from your back to your side while in a flat bed without using bedrails?: A Little Help needed moving from lying on your back to sitting on the side of a flat bed without using bedrails?: A Little Help needed moving to and from a bed to a chair (including a wheelchair)?: A Lot Help needed standing up from a chair using your arms (e.g., wheelchair or bedside chair)?: A Lot Help needed to walk in hospital room?: Total Help needed climbing 3-5 steps with a railing? : Total 6 Click Score: 12    End of Session Equipment Utilized During Treatment: Oxygen;Gait belt Activity Tolerance: Patient limited by fatigue Patient left: with call bell/phone within reach;in bed;with bed alarm set Nurse Communication: Mobility status PT Visit Diagnosis: Pain;Unsteadiness on feet (R26.81);History of falling (Z91.81) Pain - Right/Left: Left Pain - part of body: Ankle and joints of foot     Time: [id number] PT Time Calculation (min) (ACUTE ONLY): 18 min  Charges:  $Therapeutic Activity: 8-22 mins                     Rylon Poitra M,PT Acute Rehab Services [phone] (pager)    Alvira Philips 07/09/2021, 2:05 PM

## 2021-07-09 NOTE — Consult Note (Signed)
   [facility] Inpatient Consult   07/09/2021  [place] 11-21-1940 [id number]  [place] [facility] [ACO] Patient: Medicare CMS DCE  Requested that patient to be followed by [facility] PAC with traditional Medicare for any known or needs for transitional care needs for returning to post facility care or complex disease management at [place] [SNF].  For questions or referrals, please contact:   Natividad Brood, RN BSN [facility] Liaison  [phone] business mobile phone Toll free office [phone]  Fax number: [phone] [email or web address]

## 2021-07-09 NOTE — Progress Notes (Signed)
Report call and given to [facility] at [facility].

## 2021-07-09 NOTE — TOC Transition Note (Signed)
Transition of Care [facility]) - CM/SW Discharge Note   Patient Details  Name: Valerie Gay MRN: [id number] Date of Birth: [birth year]  Transition of Care [facility]) CM/SW Contact:  Joanne Chars, LCSW Phone Number: 07/09/2021, 11:12 AM   Clinical Narrative:  Pt discharging to [facility], room 3242.  RN call report to [phone].      Final next level of care: Skilled Nursing Facility Barriers to Discharge: Barriers Resolved   Patient Goals and CMS Choice Patient states their goals for this hospitalization and ongoing recovery are:: "100%" CMS Medicare.gov Compare Post Acute Care list provided to:: Patient Choice offered to / list presented to : Patient  Discharge Placement              Patient chooses bed at:  ([facility]) Patient to be transferred to facility by: [place] Name of family member notified: [place], daughter Patient and family notified of of transfer: 07/09/21  Discharge Plan and Services     Post Acute Care Choice:  (TBD based on ability of family to support 24/7)                               Social Determinants of Health (SDOH) Interventions     Readmission Risk Interventions Readmission Risk Prevention Plan 01/24/2020  Post Dischage Appt Not Complete  Appt Comments plan for SNF  Medication Screening Complete  Transportation Screening Complete  Some recent data might be hidden

## 2021-07-11 DIAGNOSIS — J9611 Chronic respiratory failure with hypoxia: Secondary | ICD-10-CM | POA: Diagnosis not present

## 2021-07-11 DIAGNOSIS — I872 Venous insufficiency (chronic) (peripheral): Secondary | ICD-10-CM | POA: Diagnosis not present

## 2021-07-11 DIAGNOSIS — I509 Heart failure, unspecified: Secondary | ICD-10-CM | POA: Diagnosis not present

## 2021-07-11 DIAGNOSIS — J449 Chronic obstructive pulmonary disease, unspecified: Secondary | ICD-10-CM | POA: Diagnosis not present

## 2021-07-13 DIAGNOSIS — L03115 Cellulitis of right lower limb: Secondary | ICD-10-CM | POA: Diagnosis not present

## 2021-07-13 DIAGNOSIS — J9611 Chronic respiratory failure with hypoxia: Secondary | ICD-10-CM | POA: Diagnosis not present

## 2021-07-13 DIAGNOSIS — J449 Chronic obstructive pulmonary disease, unspecified: Secondary | ICD-10-CM | POA: Diagnosis not present

## 2021-07-13 DIAGNOSIS — L03116 Cellulitis of left lower limb: Secondary | ICD-10-CM | POA: Diagnosis not present

## 2021-07-13 DIAGNOSIS — Z9981 Dependence on supplemental oxygen: Secondary | ICD-10-CM | POA: Diagnosis not present

## 2021-07-13 DIAGNOSIS — I2510 Atherosclerotic heart disease of native coronary artery without angina pectoris: Secondary | ICD-10-CM | POA: Diagnosis not present

## 2021-07-13 DIAGNOSIS — Z720 Tobacco use: Secondary | ICD-10-CM | POA: Diagnosis not present

## 2021-07-13 DIAGNOSIS — R600 Localized edema: Secondary | ICD-10-CM | POA: Diagnosis not present

## 2021-07-14 ENCOUNTER — Other Ambulatory Visit: Payer: Self-pay | Admitting: Internal Medicine

## 2021-07-14 DIAGNOSIS — K5900 Constipation, unspecified: Secondary | ICD-10-CM | POA: Diagnosis not present

## 2021-07-14 DIAGNOSIS — J9611 Chronic respiratory failure with hypoxia: Secondary | ICD-10-CM | POA: Diagnosis not present

## 2021-07-14 DIAGNOSIS — Z9981 Dependence on supplemental oxygen: Secondary | ICD-10-CM | POA: Diagnosis not present

## 2021-07-14 DIAGNOSIS — J449 Chronic obstructive pulmonary disease, unspecified: Secondary | ICD-10-CM | POA: Diagnosis not present

## 2021-07-22 ENCOUNTER — Inpatient Hospital Stay (HOSPITAL_COMMUNITY)
Admission: EM | Admit: 2021-07-22 | Discharge: 2021-08-16 | DRG: 871 | Disposition: E | Payer: Medicare Other | Source: Skilled Nursing Facility | Attending: Internal Medicine | Admitting: Internal Medicine

## 2021-07-22 ENCOUNTER — Other Ambulatory Visit: Payer: Self-pay

## 2021-07-22 ENCOUNTER — Encounter (HOSPITAL_COMMUNITY): Payer: Self-pay | Admitting: Emergency Medicine

## 2021-07-22 ENCOUNTER — Emergency Department (HOSPITAL_COMMUNITY): Payer: Medicare Other

## 2021-07-22 DIAGNOSIS — D649 Anemia, unspecified: Secondary | ICD-10-CM | POA: Diagnosis present

## 2021-07-22 DIAGNOSIS — Z833 Family history of diabetes mellitus: Secondary | ICD-10-CM | POA: Diagnosis not present

## 2021-07-22 DIAGNOSIS — Z66 Do not resuscitate: Secondary | ICD-10-CM

## 2021-07-22 DIAGNOSIS — R579 Shock, unspecified: Secondary | ICD-10-CM

## 2021-07-22 DIAGNOSIS — K219 Gastro-esophageal reflux disease without esophagitis: Secondary | ICD-10-CM | POA: Diagnosis present

## 2021-07-22 DIAGNOSIS — E43 Unspecified severe protein-calorie malnutrition: Secondary | ICD-10-CM

## 2021-07-22 DIAGNOSIS — R0689 Other abnormalities of breathing: Secondary | ICD-10-CM | POA: Diagnosis not present

## 2021-07-22 DIAGNOSIS — N179 Acute kidney failure, unspecified: Secondary | ICD-10-CM | POA: Diagnosis present

## 2021-07-22 DIAGNOSIS — R54 Age-related physical debility: Secondary | ICD-10-CM | POA: Diagnosis present

## 2021-07-22 DIAGNOSIS — R570 Cardiogenic shock: Secondary | ICD-10-CM | POA: Diagnosis present

## 2021-07-22 DIAGNOSIS — E872 Acidosis, unspecified: Secondary | ICD-10-CM

## 2021-07-22 DIAGNOSIS — R627 Adult failure to thrive: Secondary | ICD-10-CM | POA: Diagnosis present

## 2021-07-22 DIAGNOSIS — Z515 Encounter for palliative care: Secondary | ICD-10-CM

## 2021-07-22 DIAGNOSIS — Z7951 Long term (current) use of inhaled steroids: Secondary | ICD-10-CM

## 2021-07-22 DIAGNOSIS — R6521 Severe sepsis with septic shock: Secondary | ICD-10-CM | POA: Diagnosis present

## 2021-07-22 DIAGNOSIS — J9620 Acute and chronic respiratory failure, unspecified whether with hypoxia or hypercapnia: Secondary | ICD-10-CM | POA: Diagnosis present

## 2021-07-22 DIAGNOSIS — J440 Chronic obstructive pulmonary disease with acute lower respiratory infection: Secondary | ICD-10-CM | POA: Diagnosis present

## 2021-07-22 DIAGNOSIS — Z820 Family history of epilepsy and other diseases of the nervous system: Secondary | ICD-10-CM

## 2021-07-22 DIAGNOSIS — I2510 Atherosclerotic heart disease of native coronary artery without angina pectoris: Secondary | ICD-10-CM | POA: Diagnosis present

## 2021-07-22 DIAGNOSIS — D72829 Elevated white blood cell count, unspecified: Secondary | ICD-10-CM | POA: Diagnosis present

## 2021-07-22 DIAGNOSIS — I491 Atrial premature depolarization: Secondary | ICD-10-CM | POA: Diagnosis not present

## 2021-07-22 DIAGNOSIS — A419 Sepsis, unspecified organism: Principal | ICD-10-CM | POA: Diagnosis present

## 2021-07-22 DIAGNOSIS — Y95 Nosocomial condition: Secondary | ICD-10-CM | POA: Diagnosis present

## 2021-07-22 DIAGNOSIS — F17210 Nicotine dependence, cigarettes, uncomplicated: Secondary | ICD-10-CM | POA: Diagnosis present

## 2021-07-22 DIAGNOSIS — J449 Chronic obstructive pulmonary disease, unspecified: Secondary | ICD-10-CM | POA: Diagnosis not present

## 2021-07-22 DIAGNOSIS — E785 Hyperlipidemia, unspecified: Secondary | ICD-10-CM | POA: Diagnosis present

## 2021-07-22 DIAGNOSIS — J9622 Acute and chronic respiratory failure with hypercapnia: Secondary | ICD-10-CM | POA: Diagnosis present

## 2021-07-22 DIAGNOSIS — Z20822 Contact with and (suspected) exposure to covid-19: Secondary | ICD-10-CM | POA: Diagnosis present

## 2021-07-22 DIAGNOSIS — R64 Cachexia: Secondary | ICD-10-CM | POA: Diagnosis present

## 2021-07-22 DIAGNOSIS — Z8719 Personal history of other diseases of the digestive system: Secondary | ICD-10-CM

## 2021-07-22 DIAGNOSIS — J439 Emphysema, unspecified: Secondary | ICD-10-CM | POA: Diagnosis not present

## 2021-07-22 DIAGNOSIS — Z6824 Body mass index (BMI) 24.0-24.9, adult: Secondary | ICD-10-CM

## 2021-07-22 DIAGNOSIS — J90 Pleural effusion, not elsewhere classified: Secondary | ICD-10-CM | POA: Diagnosis not present

## 2021-07-22 DIAGNOSIS — I469 Cardiac arrest, cause unspecified: Secondary | ICD-10-CM | POA: Diagnosis present

## 2021-07-22 DIAGNOSIS — J9621 Acute and chronic respiratory failure with hypoxia: Secondary | ICD-10-CM

## 2021-07-22 DIAGNOSIS — R0602 Shortness of breath: Secondary | ICD-10-CM | POA: Diagnosis not present

## 2021-07-22 DIAGNOSIS — I4891 Unspecified atrial fibrillation: Secondary | ICD-10-CM

## 2021-07-22 DIAGNOSIS — R0603 Acute respiratory distress: Secondary | ICD-10-CM

## 2021-07-22 DIAGNOSIS — J189 Pneumonia, unspecified organism: Secondary | ICD-10-CM | POA: Diagnosis not present

## 2021-07-22 DIAGNOSIS — Z789 Other specified health status: Secondary | ICD-10-CM

## 2021-07-22 DIAGNOSIS — Z800 Family history of malignant neoplasm of digestive organs: Secondary | ICD-10-CM

## 2021-07-22 DIAGNOSIS — R0902 Hypoxemia: Secondary | ICD-10-CM | POA: Diagnosis not present

## 2021-07-22 DIAGNOSIS — G9340 Encephalopathy, unspecified: Secondary | ICD-10-CM

## 2021-07-22 DIAGNOSIS — R6520 Severe sepsis without septic shock: Secondary | ICD-10-CM

## 2021-07-22 DIAGNOSIS — Z87442 Personal history of urinary calculi: Secondary | ICD-10-CM

## 2021-07-22 DIAGNOSIS — J9611 Chronic respiratory failure with hypoxia: Secondary | ICD-10-CM | POA: Diagnosis present

## 2021-07-22 DIAGNOSIS — R000 Tachycardia, unspecified: Secondary | ICD-10-CM | POA: Diagnosis not present

## 2021-07-22 DIAGNOSIS — I959 Hypotension, unspecified: Secondary | ICD-10-CM | POA: Diagnosis not present

## 2021-07-22 DIAGNOSIS — Z9981 Dependence on supplemental oxygen: Secondary | ICD-10-CM

## 2021-07-22 DIAGNOSIS — G9341 Metabolic encephalopathy: Secondary | ICD-10-CM | POA: Diagnosis present

## 2021-07-22 DIAGNOSIS — Z79899 Other long term (current) drug therapy: Secondary | ICD-10-CM

## 2021-07-22 DIAGNOSIS — Z888 Allergy status to other drugs, medicaments and biological substances status: Secondary | ICD-10-CM

## 2021-07-22 HISTORY — DX: Other specified health status: Z78.9

## 2021-07-22 HISTORY — DX: Unspecified atrial fibrillation: I48.91

## 2021-07-22 HISTORY — DX: Encounter for palliative care: Z51.5

## 2021-07-22 HISTORY — DX: Sepsis, unspecified organism: R65.20

## 2021-07-22 HISTORY — DX: Do not resuscitate: Z66

## 2021-07-22 HISTORY — DX: Sepsis, unspecified organism: A41.9

## 2021-07-22 HISTORY — DX: Acute kidney failure, unspecified: N17.9

## 2021-07-22 HISTORY — DX: Unspecified severe protein-calorie malnutrition: E43

## 2021-07-22 HISTORY — DX: Shock, unspecified: R57.9

## 2021-07-22 HISTORY — DX: Encephalopathy, unspecified: G93.40

## 2021-07-22 HISTORY — DX: Acute and chronic respiratory failure with hypoxia: J96.21

## 2021-07-22 HISTORY — DX: Acidosis, unspecified: E87.20

## 2021-07-22 HISTORY — DX: Acidosis: E87.2

## 2021-07-22 LAB — RESP PANEL BY RT-PCR (FLU A&B, COVID) ARPGX2
Influenza A by PCR: NEGATIVE
Influenza B by PCR: NEGATIVE
SARS Coronavirus 2 by RT PCR: NEGATIVE

## 2021-07-22 LAB — COMPREHENSIVE METABOLIC PANEL
ALT: 36 U/L (ref 0–44)
AST: 60 U/L — ABNORMAL HIGH (ref 15–41)
Albumin: 2.1 g/dL — ABNORMAL LOW (ref 3.5–5.0)
Alkaline Phosphatase: 161 U/L — ABNORMAL HIGH (ref 38–126)
Anion gap: 18 — ABNORMAL HIGH (ref 5–15)
BUN: 85 mg/dL — ABNORMAL HIGH (ref 8–23)
CO2: 26 mmol/L (ref 22–32)
Calcium: 8.5 mg/dL — ABNORMAL LOW (ref 8.9–10.3)
Chloride: 93 mmol/L — ABNORMAL LOW (ref 98–111)
Creatinine, Ser: 1.79 mg/dL — ABNORMAL HIGH (ref 0.44–1.00)
GFR, Estimated: 28 mL/min — ABNORMAL LOW (ref >60)
Glucose, Bld: 105 mg/dL — ABNORMAL HIGH (ref 70–99)
Potassium: 4.8 mmol/L (ref 3.5–5.1)
Sodium: 137 mmol/L (ref 135–145)
Total Bilirubin: 0.9 mg/dL (ref 0.3–1.2)
Total Protein: 6 g/dL — ABNORMAL LOW (ref 6.5–8.1)

## 2021-07-22 LAB — CBC WITH DIFFERENTIAL/PLATELET
Abs Immature Granulocytes: 1.85 10*3/uL — ABNORMAL HIGH (ref 0.00–0.07)
Basophils Absolute: 0 10*3/uL (ref 0.0–0.1)
Basophils Relative: 0 %
Eosinophils Absolute: 0.1 10*3/uL (ref 0.0–0.5)
Eosinophils Relative: 0 %
HCT: 33.7 % — ABNORMAL LOW (ref 36.0–46.0)
Hemoglobin: 10.9 g/dL — ABNORMAL LOW (ref 12.0–15.0)
Immature Granulocytes: 4 %
Lymphocytes Relative: 2 %
Lymphs Abs: 0.7 10*3/uL (ref 0.7–4.0)
MCH: 31.5 pg (ref 26.0–34.0)
MCHC: 32.3 g/dL (ref 30.0–36.0)
MCV: 97.4 fL (ref 80.0–100.0)
Monocytes Absolute: 1.1 10*3/uL — ABNORMAL HIGH (ref 0.1–1.0)
Monocytes Relative: 3 %
Neutro Abs: 40.8 10*3/uL — ABNORMAL HIGH (ref 1.7–7.7)
Neutrophils Relative %: 91 %
Platelets: 537 10*3/uL — ABNORMAL HIGH (ref 150–400)
RBC: 3.46 MIL/uL — ABNORMAL LOW (ref 3.87–5.11)
RDW: 16.2 % — ABNORMAL HIGH (ref 11.5–15.5)
WBC: 44.6 10*3/uL — ABNORMAL HIGH (ref 4.0–10.5)
nRBC: 0 % (ref 0.0–0.2)

## 2021-07-22 LAB — BLOOD GAS, VENOUS
Acid-base deficit: 6.9 mmol/L — ABNORMAL HIGH (ref 0.0–2.0)
Bicarbonate: 21 mmol/L (ref 20.0–28.0)
FIO2: 21
O2 Saturation: 97.9 %
Patient temperature: 98.6
pCO2, Ven: 58.1 mmHg (ref 44.0–60.0)
pH, Ven: 7.184 — CL (ref 7.250–7.430)
pO2, Ven: 130 mmHg — ABNORMAL HIGH (ref 32.0–45.0)

## 2021-07-22 LAB — MAGNESIUM: Magnesium: 3 mg/dL — ABNORMAL HIGH (ref 1.7–2.4)

## 2021-07-22 LAB — LACTIC ACID, PLASMA
Lactic Acid, Venous: 6.5 mmol/L (ref 0.5–1.9)
Lactic Acid, Venous: 6.4 mmol/L (ref 0.5–1.9)

## 2021-07-22 LAB — TROPONIN I (HIGH SENSITIVITY): Troponin I (High Sensitivity): 47 ng/L — ABNORMAL HIGH (ref <18)

## 2021-07-22 LAB — LIPASE, BLOOD: Lipase: 26 U/L (ref 11–51)

## 2021-07-22 MED ORDER — DEXTROSE IN LACTATED RINGERS 5 % IV SOLN: INTRAVENOUS | Status: DC

## 2021-07-22 MED ORDER — SODIUM CHLORIDE 0.9 % IV SOLN: 250.0000 mL | INTRAVENOUS | Status: DC

## 2021-07-22 MED ORDER — AMIODARONE HCL IN DEXTROSE 360-4.14 MG/200ML-% IV SOLN: 30.0000 mg/h | INTRAVENOUS | Status: DC

## 2021-07-22 MED ORDER — HEPARIN SODIUM (PORCINE) 5000 UNIT/ML IJ SOLN: 5000.0000 [IU] | Freq: Three times a day (TID) | INTRAMUSCULAR | Status: DC

## 2021-07-22 MED ORDER — LACTATED RINGERS IV BOLUS
1500.0000 mL | Freq: Once | INTRAVENOUS | Status: AC
  Administered 2021-07-22: 1500 mL via INTRAVENOUS

## 2021-07-22 MED ORDER — LACTATED RINGERS IV BOLUS: 500.0000 mL | Freq: Once | INTRAVENOUS | Status: DC

## 2021-07-22 MED ORDER — NOREPINEPHRINE 4 MG/250ML-% IV SOLN: 2.0000 ug/min | INTRAVENOUS | Status: DC

## 2021-07-22 MED ORDER — DOCUSATE SODIUM 100 MG PO CAPS: 100.0000 mg | ORAL_CAPSULE | Freq: Two times a day (BID) | ORAL | Status: DC | PRN

## 2021-07-22 MED ORDER — VANCOMYCIN HCL 1250 MG/250ML IV SOLN
1250.0000 mg | Freq: Once | INTRAVENOUS | Status: AC
  Administered 2021-07-22: 1250 mg via INTRAVENOUS
  Filled 2021-07-22: qty 250

## 2021-07-22 MED ORDER — AMIODARONE LOAD VIA INFUSION
150.0000 mg | Freq: Once | INTRAVENOUS | Status: AC
  Administered 2021-07-22: 150 mg via INTRAVENOUS
  Filled 2021-07-22: qty 83.34

## 2021-07-22 MED ORDER — SODIUM CHLORIDE 0.9 % IV SOLN
0.0000 ug/min | INTRAVENOUS | Status: DC
  Administered 2021-07-22: 20 ug/min via INTRAVENOUS
  Filled 2021-07-22: qty 2

## 2021-07-22 MED ORDER — SODIUM BICARBONATE 8.4 % IV SOLN
100.0000 meq | Freq: Once | INTRAVENOUS | Status: AC
  Administered 2021-07-22: 100 meq via INTRAVENOUS
  Filled 2021-07-22: qty 50

## 2021-07-22 MED ORDER — AMIODARONE HCL IN DEXTROSE 360-4.14 MG/200ML-% IV SOLN
60.0000 mg/h | INTRAVENOUS | Status: DC
  Administered 2021-07-22: 60 mg/h via INTRAVENOUS
  Filled 2021-07-22: qty 200

## 2021-07-22 MED ORDER — NOREPINEPHRINE 4 MG/250ML-% IV SOLN
2.0000 ug/min | INTRAVENOUS | Status: DC
  Administered 2021-07-22 (×2): 2 ug/min via INTRAVENOUS
  Filled 2021-07-22: qty 250

## 2021-07-22 MED ORDER — LACTATED RINGERS IV BOLUS
500.0000 mL | Freq: Once | INTRAVENOUS | Status: AC
  Administered 2021-07-22: 500 mL via INTRAVENOUS

## 2021-07-22 MED ORDER — STERILE WATER FOR INJECTION IV SOLN
INTRAVENOUS | Status: DC
  Filled 2021-07-22: qty 1000

## 2021-07-22 MED ORDER — PHENYLEPHRINE HCL (PRESSORS) 10 MG/ML IV SOLN
25.0000 ug/min | INTRAVENOUS | Status: DC
  Filled 2021-07-22: qty 2

## 2021-07-22 MED ORDER — POLYETHYLENE GLYCOL 3350 17 G PO PACK: 17.0000 g | PACK | Freq: Every day | ORAL | Status: DC | PRN

## 2021-07-22 MED ORDER — IPRATROPIUM-ALBUTEROL 0.5-2.5 (3) MG/3ML IN SOLN
3.0000 mL | RESPIRATORY_TRACT | Status: DC
  Administered 2021-07-22: 3 mL via RESPIRATORY_TRACT
  Filled 2021-07-22 (×2): qty 3

## 2021-07-22 MED ORDER — FAMOTIDINE IN NACL 20-0.9 MG/50ML-% IV SOLN: 20.0000 mg | INTRAVENOUS | Status: DC

## 2021-07-22 MED ORDER — HYDROCORTISONE NA SUCCINATE PF 100 MG IJ SOLR: 100.0000 mg | Freq: Two times a day (BID) | INTRAMUSCULAR | Status: DC

## 2021-07-22 MED ORDER — ALBUTEROL (5 MG/ML) CONTINUOUS INHALATION SOLN
10.0000 mg/h | INHALATION_SOLUTION | Freq: Once | RESPIRATORY_TRACT | Status: AC
  Administered 2021-07-22: 10 mg/h via RESPIRATORY_TRACT
  Filled 2021-07-22: qty 20

## 2021-07-22 MED ORDER — VANCOMYCIN HCL IN DEXTROSE 1-5 GM/200ML-% IV SOLN: 1000.0000 mg | INTRAVENOUS | Status: DC

## 2021-07-22 MED ORDER — BUDESONIDE 0.25 MG/2ML IN SUSP
0.2500 mg | Freq: Two times a day (BID) | RESPIRATORY_TRACT | Status: DC
  Administered 2021-07-22: 0.25 mg via RESPIRATORY_TRACT
  Filled 2021-07-22: qty 2

## 2021-07-22 MED ORDER — SODIUM CHLORIDE 0.9 % IV SOLN
2.0000 g | Freq: Once | INTRAVENOUS | Status: AC
  Administered 2021-07-22: 2 g via INTRAVENOUS
  Filled 2021-07-22: qty 2

## 2021-07-22 MED ORDER — METHYLPREDNISOLONE SODIUM SUCC 125 MG IJ SOLR: 125.0000 mg | Freq: Once | INTRAMUSCULAR | Status: DC

## 2021-07-22 MED ORDER — SODIUM CHLORIDE 0.9 % IV SOLN: 2.0000 g | INTRAVENOUS | Status: DC

## 2021-07-22 MED ORDER — INSULIN ASPART 100 UNIT/ML IJ SOLN
0.0000 [IU] | INTRAMUSCULAR | Status: DC
  Filled 2021-07-22: qty 0.09

## 2021-07-22 MED ORDER — FENTANYL CITRATE PF 50 MCG/ML IJ SOSY: 25.0000 ug | PREFILLED_SYRINGE | INTRAMUSCULAR | Status: DC | PRN

## 2021-07-27 LAB — CULTURE, BLOOD (ROUTINE X 2)
Culture: NO GROWTH
Culture: NO GROWTH
Special Requests: ADEQUATE
Special Requests: ADEQUATE

## 2021-07-28 LAB — BLOOD GAS, VENOUS
Acid-base deficit: 2.8 mmol/L — ABNORMAL HIGH (ref 0.0–2.0)
Bicarbonate: 26.8 mmol/L (ref 20.0–28.0)
O2 Saturation: 33.8 %
Patient temperature: 98.6
pCO2, Ven: 76.2 mmHg (ref 44.0–60.0)
pH, Ven: 7.171 — CL (ref 7.250–7.430)
pO2, Ven: 30.6 mmHg — CL (ref 32.0–45.0)

## 2021-08-16 NOTE — Progress Notes (Signed)
Pharmacy Antibiotic Note  Valerie Gay is a 80 y.o. female admitted on 2021/08/05 with pneumonia.  Pharmacy has been consulted for vancomycin & cefepime dosing. Tm 99;  WBC 44.6; SCr 1.79, CrCl ~ 21 ml/min 9/6 CXR: new RLL PNA  Plan: Vancomycin 1250 mg IV x 1 given in ED @ 0844 am Vancomycin 1000 mg IV q48 for est AUC 554 using SCr 1.79 Cefepime 2 gm IV q24 F/u WBC, temp, culture data, MRSA PCR Vancomycin levels as needed  Height: '5\' 2"'$  (157.5 cm) Weight: 61.7 kg (135 lb 15.7 oz) IBW/kg (Calculated) : 50.1  Temp (24hrs), Avg:99 F (37.2 C), Min:99 F (37.2 C), Max:99 F (37.2 C)  Recent Labs  Lab 2021-08-05 0711 08/05/2021 0830 2021/08/05 1008  WBC 44.6*  --   --   CREATININE 1.79*  --   --   LATICACIDVEN  --  6.5* 6.4*    Estimated Creatinine Clearance: 20.9 mL/min (A) (by C-G formula based on SCr of 1.79 mg/dL (H)).    Allergies  Allergen Reactions   Spiriva Handihaler [Tiotropium Bromide Monohydrate] Hives, Itching and Rash     on upper body   Banana Itching    Antimicrobials this admission: 9/6 cefepime 9/6 vanc>>  Dose adjustments this admission:  Microbiology results: 9/6 BCx2: ip 9/6 legionella: ordered 9/6 MRSA: ordered 9/6 Covid/flu neg 9/6 strep pneumo: ordered 9/6 UCx: ordered  Thank you for allowing pharmacy to be a part of this patient's care.  Eudelia Bunch, Pharm.D 08-05-2021 11:46 AM

## 2021-08-16 NOTE — H&P (Addendum)
NAME:  Valerie Gay, MRN:  [id number], DOB:  18-Nov-1940, LOS: 0 ADMISSION DATE:  08-21-2021, CONSULTATION DATE:  August 21, 2021  REFERRING MD:  Dr Caleen Essex, CHIEF COMPLAINT:  Resp failure, enceophalopathy, sepsis, AKI, COPD   History of Present Illness: Provided by chart review and talking to the daughter and son   80 year old female followed by Dr. Chase Caller for severe COPD on chronic hypoxemic respiratory failure 3 L oxygen, smoker as of December 2021 with dyslipidemia and coronary artery calcification with history of normal cardiac Myoview.  Seen in April 2022 at pulmonary office for COPD exacerbation.  According to the daughter sometimes starting July 2022 started showing self-neglect with diminished p.o. intake and failure to thrive and significant weight loss consistent with protein calorie malnutrition as well.  Then admitted 07/01/2021 through 07/09/2021 with right lower extremity cellulitis that improved with antibiotics.  She also had fluctuant swelling in the dorsum of the right foot.  Seen by orthopedics and recommended conservative treatment.  She was then discharged to [facility] skilled nursing facility.   Admitted then on Aug 21, 2021 with severe acute on chronic hypoxemic respiratory failure with respiratory acidosis and lactic acidosis of 6 and a high white count leukocytosis, chronic anemia and acute kidney injury and circulatory shock requiring Levophed and treatment BiPAP.  Given cefepime by the ED.  Also acute encephalopathy that is associated with the above.  Chest x-ray consistent with a new onset right middle lobe pneumonia [August 20 22 chest x-ray did not have this).  Critical care medicine called to admit.  She is COVID-negative  Pertinent  Medical History   PFT Results Latest Ref Rng & Units 05/07/2014  FVC-Pre L 2.00  FVC-Predicted Pre % 71  FVC-Post L 2.39  FVC-Predicted Post % 84  Pre FEV1/FVC % % 50  Post FEV1/FCV % % 47  FEV1-Pre L 0.99  FEV1-Predicted Pre % 47   FEV1-Post L 1.11  TLC L 5.12  TLC % Predicted % 101  RV % Predicted % 138      has a past medical history of Acute encephalopathy (August 21, 2021), Acute on chronic respiratory failure with hypoxia and hypercapnia ([place]) (2021/08/21), AKI (acute kidney injury) ([place]) (August 21, 2021), Anemia (age 41), Atrial fibrillation with RVR ([place]) (Aug 21, 2021), Cellulitis and abscess of foot (03/2017), Colon polyps, COPD (chronic obstructive pulmonary disease) ([place]), Diverticulosis, DNR (do not resuscitate) (21-Aug-2021), History of hiatal hernia, History of kidney stones, Hyperlipidemia, Lactic acidosis (2021-08-21), On home oxygen therapy, Palliative care status (2021/08/21), Protein-calorie malnutrition, severe ([place]) (08-21-21), Resides in skilled nursing facility (Aug 21, 2021), Severe sepsis ([place]) (08-21-21), and Shock circulatory ([place]) (21-Aug-2021).   reports that she quit smoking about 2 years ago. Her smoking use included cigarettes. She has a 30.00 pack-year smoking history. She has never used smokeless tobacco.  Past Surgical History:  Procedure Laterality Date   BALLOON DILATION N/A 03/08/2017   Procedure: BALLOON DILATION;  Surgeon: Manus Gunning, MD;  Location: [facility];  Service: Gastroenterology;  Laterality: N/A;   CATARACT EXTRACTION Right    August 15, 2013   CHOLECYSTECTOMY N/A 06/22/2014   Procedure: LAPAROSCOPIC CHOLECYSTECTOMY WITH INTRAOPERATIVE CHOLANGIOGRAM;  Surgeon: Gayland Curry, MD;  Location: [place];  Service: General;  Laterality: N/A;   COLONOSCOPY N/A 02/19/2017   Procedure: COLONOSCOPY;  Surgeon: Manus Gunning, MD;  Location: [facility];  Service: Gastroenterology;  Laterality: N/A;   ESOPHAGOGASTRODUODENOSCOPY N/A 02/19/2017   Procedure: ESOPHAGOGASTRODUODENOSCOPY (EGD);  Surgeon: Manus Gunning, MD;  Location: [facility];  Service: Gastroenterology;  Laterality: N/A;  ESOPHAGOGASTRODUODENOSCOPY N/A 03/08/2017   Procedure: ESOPHAGOGASTRODUODENOSCOPY (EGD);  Surgeon:  Manus Gunning, MD;  Location: [facility];  Service: Gastroenterology;  Laterality: N/A;   ESOPHAGOGASTRODUODENOSCOPY N/A 04/06/2017   Procedure: ESOPHAGOGASTRODUODENOSCOPY (EGD);  Surgeon: Manus Gunning, MD;  Location: [facility];  Service: Gastroenterology;  Laterality: N/A;   ESOPHAGOGASTRODUODENOSCOPY (EGD) WITH PROPOFOL N/A 04/27/2017   Procedure: ESOPHAGOGASTRODUODENOSCOPY (EGD) WITH PROPOFOL;  Surgeon: Manus Gunning, MD;  Location: [facility];  Service: Gastroenterology;  Laterality: N/A;   NM MYOVIEW LTD  10/2016    EF 55-65% with normal LV function. LOW RISK. No ischemia or infarction.   TONSILLECTOMY     TRANSTHORACIC ECHOCARDIOGRAM  06/2019   EF 60 to 65%.  No R WMA.  Normal RV function.  Mild calcification of aortic valve but no stenosis.  Mild mitral valve leaflet calcification but no stenosis or regurgitation.    Allergies  Allergen Reactions   Spiriva Handihaler [Tiotropium Bromide Monohydrate] Hives, Itching and Rash     on upper body   Banana Itching    Immunization History  Administered Date(s) Administered   Fluad Quad(high Dose 65+) 06/19/2019   H1N1 11/05/2008   Influenza Whole 07/29/2014   Influenza, High Dose Seasonal PF 10/18/2018, 12/20/2018, 07/27/2019   Influenza,inj,Quad PF,6+ Mos 09/27/2015, 12/01/2016   Moderna Sars-Covid-2 Vaccination 11/27/2019, 12/25/2019   Pneumococcal Polysaccharide-23 03/28/2014   Td 01/17/2020   Zoster, Live 04/17/2016    Family History  Problem Relation Age of Onset   Alzheimer's disease Mother    Diabetes Mother    Cancer Father    Stomach cancer Paternal Grandmother    Colon cancer Neg Hx    Esophageal cancer Neg Hx      Current Facility-Administered Medications:    0.9 %  sodium chloride infusion, 250 mL, Intravenous, Continuous, Godfrey Pick, MD   [COMPLETED] amiodarone (NEXTERONE) 1.8 mg/mL load via infusion 150 mg, 150 mg, Intravenous, Once, 150 mg at August 05, 2021 1210 **FOLLOWED BY**  amiodarone (NEXTERONE PREMIX) 360-4.14 MG/200ML-% (1.8 mg/mL) IV infusion, 60 mg/hr, Intravenous, Continuous, Last Rate: 33.3 mL/hr at 08/05/2021 1216, 60 mg/hr at 08/05/21 1216 **FOLLOWED BY** amiodarone (NEXTERONE PREMIX) 360-4.14 MG/200ML-% (1.8 mg/mL) IV infusion, 30 mg/hr, Intravenous, Continuous, Dixon, Ryan, MD   budesonide (PULMICORT) nebulizer solution 0.25 mg, 0.25 mg, Nebulization, BID, Chase Caller, Khriz Liddy, MD, 0.25 mg at August 05, 2021 1207   dextrose 5 % in lactated ringers infusion, , Intravenous, Continuous, Niki Cosman, MD   docusate sodium (COLACE) capsule 100 mg, 100 mg, Oral, BID PRN, Brand Males, MD   fentaNYL (SUBLIMAZE) injection 25-100 mcg, 25-100 mcg, Intravenous, Q2H PRN, Chase Caller, Fabienne Nolasco, MD   heparin injection 5,000 Units, 5,000 Units, Subcutaneous, Q8H, Kamri Gotsch, MD   insulin aspart (novoLOG) injection 0-9 Units, 0-9 Units, Subcutaneous, Q4H, Keny Donald, MD   ipratropium-albuterol (DUONEB) 0.5-2.5 (3) MG/3ML nebulizer solution 3 mL, 3 mL, Nebulization, Q4H, Thu Baggett, MD, 3 mL at 08/05/2021 1207   lactated ringers bolus 500 mL, 500 mL, Intravenous, Once, Brand Males, MD   norepinephrine (LEVOPHED) '4mg'$  in 24m premix infusion, 2-10 mcg/min, Intravenous, Titrated, DGodfrey Pick MD, Last Rate: 7.5 mL/hr at 02022/09/201248, 2 mcg/min at [id number]   phenylephrine (NEO-SYNEPHRINE) 20 mg in sodium chloride 0.9 % 250 mL (0.08 mg/mL) infusion, 25-200 mcg/min, Intravenous, Titrated, Bell, Michelle T, RPH   polyethylene glycol (MIRALAX / GLYCOLAX) packet 17 g, 17 g, Oral, Daily PRN, RBrand Males MD   [START ON 07/24/2021] vancomycin (VANCOCIN) IVPB 1000 mg/200 mL premix, 1,000 mg, Intravenous, Q48H, BEudelia Bunch RPH  Current Outpatient Medications:    acetaminophen (TYLENOL) 325 MG tablet, Take 2 tablets (650 mg total) by mouth every 6 (six) hours as needed for mild pain (or Fever >/= 101)., Disp:  , Rfl:    albuterol (PROVENTIL HFA;VENTOLIN  HFA) 108 (90 Base) MCG/ACT inhaler, Inhale 2 puffs into the lungs every 6 (six) hours as needed for wheezing or shortness of breath., Disp: 3 Inhaler, Rfl: 3   albuterol (PROVENTIL) (2.5 MG/3ML) 0.083% nebulizer solution, Take 3 mLs (2.5 mg total) by nebulization every 8 (eight) hours as needed for wheezing., Disp: 270 mL, Rfl: 11   bisacodyl (DULCOLAX) 5 MG EC tablet, Take 10 mg by mouth daily as needed for moderate constipation., Disp: , Rfl:    BREZTRI AEROSPHERE 160-9-4.8 MCG/ACT AERO, INHALE 2 INHALATIONS BY  MOUTH INTO THE LUNGS IN THE MORNING AND AT BEDTIME (Patient taking differently: Inhale 2 puffs into the lungs in the morning and at bedtime.), Disp: 32.1 g, Rfl: 3   docusate sodium (COLACE) 100 MG capsule, Take 100 mg by mouth 2 (two) times daily., Disp: , Rfl:    ferrous sulfate 325 (65 FE) MG tablet, Take 325 mg by mouth daily with breakfast., Disp: , Rfl:    furosemide (LASIX) 40 MG tablet, TAKE 1 TABLET(40 MG) BY MOUTH DAILY.may take an additional 40 mg if need for increase swelling or shortness of breath (Patient taking differently: Take 40 mg by mouth daily. may take an additional 40 mg if need for increase swelling or shortness of breath), Disp: 90 tablet, Rfl: 2   hydroxypropyl methylcellulose / hypromellose (ISOPTO TEARS / GONIOVISC) 2.5 % ophthalmic solution, Place 2 drops into both eyes 3 (three) times daily as needed for dry eyes., Disp: , Rfl:    ibuprofen (ADVIL) 200 MG tablet, Take 400 mg by mouth every 6 (six) hours as needed for mild pain., Disp: , Rfl:    levalbuterol (XOPENEX) 1.25 MG/0.5ML nebulizer solution, Take 1.25 mg by nebulization 4 (four) times daily., Disp: 1 each, Rfl: 12   Multiple Vitamins-Minerals (MULTIVITAMIN WITH MINERALS) tablet, Take 1 tablet by mouth daily., Disp: , Rfl:    OXYGEN, Inhale 3 L into the lungs continuous., Disp: , Rfl:    polyethylene glycol (MIRALAX / GLYCOLAX) 17 g packet, Take 17 g by mouth daily as needed for mild constipation., Disp: 14  each, Rfl: 0   Propylene Glycol (SYSTANE BALANCE) 0.6 % SOLN, Place 1-2 drops into both eyes daily as needed (dry eyes)., Disp: , Rfl:    senna (SENOKOT) 8.6 MG tablet, Take 2 tablets by mouth at bedtime., Disp: , Rfl:    sodium chloride (OCEAN) 0.65 % nasal spray, Place 1 spray into the nose 6 (six) times daily., Disp: , Rfl:    predniSONE (DELTASONE) 10 MG tablet, 4 tabs daily for 3 days 3 tabs daily for 3 days 2 tabs daily for 3 days 1 tabs daily for 3 days (Patient not taking: No sig reported), Disp: 30 tablet, Rfl: 0   Significant Hospital Events: Including procedures, antibiotic start and stop dates in addition to other pertinent events   08-17-21: Admitted with circulatory shock, A. fib, acute encephalopathy acute respiratory failure and right sided pneumonia severe.  Admitted to [facility] long ICU from bed 14 in the emergency department.  Made DNR but full medical care but also associated concurrent comfort care  Interim History / Subjective:  August 17, 2021: Seen in [facility] emergency department bed 14  Objective   Blood pressure (!) 77/56, pulse [name])  34, temperature 99 F (37.2 C), temperature source Rectal, resp. rate (!) 25, height '5\' 2"'$  (1.575 m), weight 61.7 kg, SpO2 (!) 76 %.    FiO2 (%):  [45 %-60 %] 45 % Set Rate:  [12 bmp] 12 bmp Vt Set:  [450 mL] 450 mL   Intake/Output Summary (Last 24 hours) at Aug 01, 2021 1249 Last data filed at 2021-08-01 1031 Gross per 24 hour  Intake 999.33 ml  Output --  Net 999.33 ml   Filed Weights   2021/08/01 0645 08-01-21 0647  Weight: 61.7 kg 61.7 kg    Examination: General: Extremely frail cachectic female.  Has lost significant amount of weight compared to April 2022 HENT: Has BiPAP on Lungs: Coarse, tachypneic, mildly paradoxical [apparently this is better than when she came in] Cardiovascular: A. fib RVR and circulatory shock on Levophed Abdomen: Soft Extremities: No obvious evidence of cellulitis.  Some chronic venous stasis edema  present.  Overall cachectic with severe muscular wasting Neuro: Initially obtunded but later with BiPAP confused but intermittently trying to answer but not agitated GU: Not examined  Hospital Problem list   Present on Admission Present on Admission:  Protein-calorie malnutrition, severe ([place])  Failure to thrive in adult  Pulmonary cachexia due to COPD ([place])  DNR (do not resuscitate)  Acute on chronic respiratory failure with hypoxia and hypercapnia (HCC)  HCAP (healthcare-associated pneumonia)  Lactic acidosis  Severe sepsis (HCC)  AKI (acute kidney injury) ([place])  Atrial fibrillation with RVR (HCC)  Acute encephalopathy  Anemia  COPD Gold II D, still smoking  CAD (coronary artery disease)  Leukocytosis  Chronic respiratory failure with hypoxia (HCC)  Acute and chronic respiratory failure (acute-on-chronic) (HCC)   Active Problems Principal Problem:   Septic shock (HCC) Active Problems:   Acute on chronic respiratory failure with hypoxia and hypercapnia (HCC)   HCAP (healthcare-associated pneumonia)   Lactic acidosis   Severe sepsis (HCC)   AKI (acute kidney injury) (HCC)   Atrial fibrillation with RVR (HCC)   Acute encephalopathy   Anemia   Leukocytosis   Chronic respiratory failure with hypoxia (HCC)   Protein-calorie malnutrition, severe (HCC)   Failure to thrive in adult   Pulmonary cachexia due to COPD ([place])   DNR (do not resuscitate)   Palliative care status   Resides in skilled nursing facility   Comfort measures only status   COPD Gold II D, still smoking   CAD (coronary artery disease)   Acute and chronic respiratory failure (acute-on-chronic) ([place])   Comprehensive Problem List Patient Active Problem List   Diagnosis Date Noted   Acute on chronic respiratory failure with hypoxia and hypercapnia ([place]) 08-01-2021    Priority: High   HCAP (healthcare-associated pneumonia) 2021-08-01    Priority: High   Lactic acidosis 08-01-21    Priority: High     Class: Acute   Severe sepsis ([place]) August 01, 2021    Priority: High    Class: Acute   AKI (acute kidney injury) ([place]) 01-Aug-2021    Priority: High    Class: Acute   Atrial fibrillation with RVR ([place]) 08-01-2021    Priority: High    Class: Acute   Acute encephalopathy August 01, 2021    Priority: High    Class: Acute   Septic shock ([place]) 01-Aug-2021    Priority: High    Class: Acute   Protein-calorie malnutrition, severe ([place]) Aug 01, 2021    Priority: Medium   Failure to thrive in adult 2021-08-01    Priority: Medium   Pulmonary cachexia due  to COPD ([place]) 08/13/2021    Priority: Medium    Class: Chronic   DNR (do not resuscitate) 08-13-21    Priority: Medium   Palliative care status August 13, 2021    Priority: Medium    Class: Present on Admission   Resides in skilled nursing facility 2021-08-13    Priority: Medium    Class: Present on Admission   Comfort measures only status 08-13-2021    Priority: Medium    Class: Acute   Chronic respiratory failure with hypoxia ([place]) 01/17/2019    Priority: Medium   Leukocytosis 03/27/2014    Priority: Medium   Anemia 07/21/2007    Priority: Medium   CAD (coronary artery disease) 06/23/2019    Priority: Low   COPD Gold II D, still smoking 05/07/2014    Priority: Low   TOBACCO USE 11/05/2008    Priority: Low   Acute and chronic respiratory failure (acute-on-chronic) ([place]) 2021-08-13   Nicotine dependence, cigarettes, uncomplicated 0000000   Bilateral edema of lower extremity 01/22/2020   Edema 06/23/2019   Cellulitis of right lower extremity 04/13/2017   GERD (gastroesophageal reflux disease) 04/13/2017   Esophageal stricture    Dysphagia    Abnormal barium swallow    History of colonic polyps    Polyp of ascending colon    Polyp of transverse colon    Acute URI 12/13/2016   Coronary artery calcification 10/23/2016   Hyperlipidemia LDL goal <100 10/23/2016   Medication management 10/23/2016   DOE (dyspnea on exertion) 10/23/2016    Lung nodule 02/26/2015   Nodule of right lung 11/28/2014   Acute calculous cholecystitis 06/22/2014   Acute cholecystitis 06/22/2014   Pre-operative respiratory examination 06/22/2014   Abnormal chest x-ray 05/28/2014   Nicotine abuse 03/27/2014   Hypokalemia 03/27/2014   COPD (chronic obstructive pulmonary disease) ([place]) 11/05/2008   HYPERGLYCEMIA 11/05/2008     Assessment & Plan:  ASSESSMENT / PLAN:  PULMONARY  A:  Smoking and Gold stage III COPD with chronic hypoxemic respiratory failure 3 L nasal cannula [baseline prior to and present on admission]  Admitted with severe acute on chronic hypoxemic and hypercapnic respiratory failure with respiratory acidosis  Aug 13, 2021 -> started on BiPAP with high oxygen in the ER.  High risk for intubation  P:   BiPAP Bronchodilators Nebulized steroids No active wheezing so we will hold off on IV steroids No intubation [after goals of care discussion] Fentanyl as needed for class IV dyspnea and comfort -if needed can do Precedex   NEUROLOGIC A:   Acute obtunded and later mild agitated encephalopathy present on admission -secondary to severe sepsis   P:   Fentanyl as needed Treat underlying causes Precedex infusion if needed    VASCULAR A:   Circulatory shock secondary to severe septic shock present on admission  P:  Change Levophed to Neo-Synephrine secondary to A. fib RVR Mean arterial pressure goal greater than 65 Fluid boluses as needed  CARDIAC STRUCTURAL A: History of coronary artery calcification [baseline prior to and present on admission] with history of normal Myoview  High risk for non-STEMI present on admission  P: Cycle cardiac enzymes  CARDIAC ELECTRICAL A: A. fib RVR present on admission new diagnosis   P: Amiodarone infusion if needed Telemetry  INFECTIOUS A:   Severe right-sided pneumonia -present on admission, presenting problem.  Likely HCAP  Severe sepsis secondary to the above  [high white count, pneumonia, severe lactic acid]  P:   Cefepime Aug 13, 2021 - 13-Aug-2021 Vancomycin Aug 13, 2021 Zosyn 08-13-2021  RENAL A:  Acute kidney injury present on admission [baseline creatinine 1.03 mg percent] -secondary to sepsis.  Admit creatinine of 1.79 mg percent  P:  Hydrate Avoid nephrotoxin Maintain hemodynamics  Metabolic  A: Severe lactic acidosis present on admission likely due to sepsis.  Troponin normal at admission  Plan - Hydrate, treat sepsis and monitor  ELECTROLYTES A:  Hypermagnesemia present at admission -3.0 mg percent  P: Hydrate and monitor   GASTROINTESTINAL A:   Severe protein calorie malnutrition present on admission [albumin of 2.1 at Index:   N.p.o.   HEMATOLOGIC   - HEME A:  Anemia present on admission hemoglobin 10 g% [baseline 8 g%] -likely hemoconcentration  Severe leukocytosis present on admission 40 4K [baseline 12 K] -secondary to sepsis   P:  - PRBC for hgb </= 6.9gm%    - exceptions are   -  if ACS susepcted/confirmed then transfuse for hgb </= 8.0gm%,  or    -  active bleeding with hemodynamic instability, then transfuse regardless of hemoglobin value   At at all times try to transfuse 1 unit prbc as possible with exception of active hemorrhage   HEMATOLOGIC - Platelets A Thrombocytosis present on admission 537K -likely due to sepsis At risk for thrombocytopenia  P -Subacute heparin [if A. fib persist then will need IV heparin]  ENDOCRINE A:   At risk for hypo and hyperglycemia P:   SSI  MSK/DERM History of cellulitis August 2022 skilled nursing facility resident -since August 2022 Failure to thrive and cachexia -since July 2022  Plan - Assess for sacral decub present on admission   Best Practice (right click and "Reselect all SmartList Selections" daily)   Diet/type: NPO DVT prophylaxis: prophylactic heparin  GI prophylaxis: H2B Lines: N/A Foley:  N/A Code Status:  DNR _ + concurrent  comfort care plus concurrent full medical care Last date of multidisciplinary goals of care discussion [-detailed goals of care discussion with the daughter x2 and the son x1 at the bedside in the emergency department at [facility] long.  Explained failure to thrive and poor prognosis.  They said they are aware of mom's declining health.  Goal is to get the patient better with BiPAP and pressors and medical management.  If this fails then proceed to transitioning to full comfort.  Meanwhile ongoing concurrent comfort care/palliative care to continue]  Unlikely to survive   ATTESTATION & SIGNATURE   The  patient Areona Frierson is critically ill with multiple organ systems failure and requires high complexity decision making for assessment and support, frequent evaluation and titration of therapies, application of advanced monitoring technologies and extensive interpretation of multiple databases.   Critical Care Time devoted to patient care services described in this note is  90  Minutes. This time reflects time of care of this signee Dr Brand Males. This critical care time does not reflect procedure time, or teaching time or supervisory time of PA/NP/Med student/Med Resident etc but could involve care discussion time     Dr. Brand Males, M.D., [facility].C.P Pulmonary and Critical Care Medicine Staff Physician [place] [facility] Pager: [phone], If no answer or between  15:00h - 7:00h: call [phone]  08-05-2021 12:49 PM    LABS    PULMONARY Recent Labs  Lab 2021/08/05 0712 August 05, 2021 0845  HCO3 26.8 21.0  O2SAT 33.8 97.9    CBC Recent Labs  Lab Aug 05, 2021 0711  HGB 10.9*  HCT 33.7*  WBC 44.6*  PLT 537*    COAGULATION No results for input(s): INR in the last 168 hours.  CARDIAC  No results for input(s): TROPONINI in the last 168 hours. No results for input(s): PROBNP in the last 168 hours.   CHEMISTRY Recent Labs   Lab July 30, 2021 0711  NA 137  K 4.8  CL 93*  CO2 26  GLUCOSE 105*  BUN 85*  CREATININE 1.79*  CALCIUM 8.5*  MG 3.0*   Estimated Creatinine Clearance: 20.9 mL/min (A) (by C-G formula based on SCr of 1.79 mg/dL (H)).   LIVER Recent Labs  Lab July 30, 2021 0711  AST 60*  ALT 36  ALKPHOS 161*  BILITOT 0.9  PROT 6.0*  ALBUMIN 2.1*     INFECTIOUS Recent Labs  Lab 30-Jul-2021 0830 Jul 30, 2021 1008  LATICACIDVEN 6.5* 6.4*     ENDOCRINE CBG (last 3)  No results for input(s): GLUCAP in the last 72 hours.       IMAGING x48h  - image(s) personally visualized  -   highlighted in bold DG Chest 2 View  Result Date: 07-30-21 CLINICAL DATA:  Shortness of breath. EXAM: CHEST - 2 VIEW COMPARISON:  Chest x-ray dated July 07, 2021. FINDINGS: The heart size and mediastinal contours are within normal limits. New patchy consolidation throughout the right lower lobe, most dense in the superior segment. New small right pleural effusion. Severe emphysematous changes again noted. No pneumothorax. No acute osseous abnormality. IMPRESSION: 1. New right lower lobe pneumonia and small right pleural effusion. Followup PA and lateral chest X-ray is recommended in 3-4 weeks following trial of antibiotic therapy to ensure resolution. Electronically Signed   By: Titus Dubin M.D.   On: 07-30-21 07:27

## 2021-08-16 NOTE — ED Notes (Signed)
Per Ramaswamy MD, levo is to be stopped and amiodrane is to start.

## 2021-08-16 NOTE — Progress Notes (Addendum)
Worsening h ypotensio per report  Plan Change neo back to levophed Ensure concurrent comfort Bic bolu and gtt Stress dose steroids    SIGNATURE    Dr. Brand Males, M.D., F.C.C.P,  Pulmonary and Critical Care Medicine Staff Physician, [place] Director - Interstitial Lung Disease  Program  P[facility] [place] [facility] [place], [place], [address]  NPI Number:  NPI [id number]  Pager: [phone], If no answer  -> Check AMION or Try [phone] Telephone (clinical office): [phone] Telephone (research): [phone]  12:53 PM 08/11/2021

## 2021-08-16 NOTE — Progress Notes (Signed)
A consult was received from an ED physician for vancomycin per pharmacy dosing.  The patient's profile has been reviewed for ht/wt/allergies/indication/available labs.   A one time order has been placed for vancomycin 1250 mg IV x1.  Further antibiotics/pharmacy consults should be ordered by admitting physician if indicated.                       Thank you, Dimple Nanas 08/11/2021  7:23 AM

## 2021-08-16 NOTE — ED Notes (Signed)
TOD pronounced by Doren Custard MD at 1304.

## 2021-08-16 NOTE — ED Notes (Signed)
Dr. Doren Custard notified of critical lab values of lactic acid 6.5, Ph of 7.185.

## 2021-08-16 NOTE — Progress Notes (Signed)
Chaplain engaged in an initial visit with Kairee's daughter and son.  Chaplain provided listening, support and presence.  Chaplain learned about Almarosa's life, her creativity, kindness, and love as a mother.    Chaplain provided prayer with Lanaysia's children at her bedside.     Jul 26, 2021 1600  Clinical Encounter Type  Visited With Family  Visit Type Death;Spiritual support  Referral From Family  Consult/Referral To Chaplain  Spiritual Encounters  Spiritual Needs Prayer;Emotional;Grief support

## 2021-08-16 NOTE — ED Triage Notes (Signed)
Pt BIB [facility] from [facility] for SpO2 in 80s even on 4L [place]. Baseline 2L [place]. Productive cough x3 days with increasing SHOB. Diminished breath sounds in all lobes. Albuterol neb and duoneben route. 125 solumedrol. 20ga LAC.

## 2021-08-16 NOTE — ED Provider Notes (Signed)
[facility] DEPT Provider Note   CSN: [id number] Arrival date & time: 2021-07-28  [id number]     History Chief Complaint  Patient presents with   Shortness of Breath    Valerie Gay is a 80 y.o. female.   Shortness of Breath Severity:  Severe Onset quality:  Gradual Duration:  3 days Timing:  Constant Progression:  Worsening Chronicity:  New Relieved by:  Nothing Risk factors comment:  History of COPD Patient presents for shortness of breath.  Medical history notable for COPD.  She is reportedly on 2-3 L of supplemental oxygen at baseline.  Patient arrives via EMS.  Prior to arrival, she received nebulized breathing treatments and 125 mg of Solu-Medrol.  Patient endorses worsening shortness of breath over the past 3 days.  History is limited by the patient's respiratory distress.    Past Medical History:  Diagnosis Date   Acute encephalopathy Jul 28, 2021   Acute on chronic respiratory failure with hypoxia and hypercapnia ([place]) 07-28-2021   AKI (acute kidney injury) ([place]) Jul 28, 2021   Anemia age 28   Atrial fibrillation with RVR ([place]) July 28, 2021   Cellulitis and abscess of foot 03/2017   rt foot   Colon polyps    2010    Comfort measures only status 07/28/2021   Concurrent comfort with medical care   COPD (chronic obstructive pulmonary disease) ([place])    Diverticulosis    DNR (do not resuscitate) 28-Jul-2021   History of hiatal hernia    History of kidney stones    Hyperlipidemia    Lactic acidosis 07-28-2021   On home oxygen therapy    Palliative care status 07/28/2021   Protein-calorie malnutrition, severe ([place]) July 28, 2021   Resides in skilled nursing facility 07/28/2021   Severe sepsis ([place]) 2021-07-28   Shock circulatory ([place]) 07/28/21    Patient Active Problem List   Diagnosis Date Noted   Protein-calorie malnutrition, severe ([place]) 28-Jul-2021   Failure to thrive in adult 07/28/2021   Pulmonary cachexia due to COPD ([place]) 07/28/2021    Class:  Chronic   DNR (do not resuscitate) 2021-07-28   Acute on chronic respiratory failure with hypoxia and hypercapnia ([place]) Jul 28, 2021   HCAP (healthcare-associated pneumonia) 07/28/2021   Lactic acidosis 07-28-2021    Class: Acute   Severe sepsis ([place]) 07-28-21    Class: Acute   AKI (acute kidney injury) ([place]) 2021/07/28    Class: Acute   Atrial fibrillation with RVR ([place]) 07/28/2021    Class: Acute   Acute encephalopathy 2021-07-28    Class: Acute   Palliative care status 07/28/21    Class: Present on Admission   Acute and chronic respiratory failure (acute-on-chronic) ([place]) 07/28/2021   Septic shock ([place]) 2021/07/28    Class: Acute   Resides in skilled nursing facility 2021-07-28    Class: Present on Admission   Comfort measures only status July 28, 2021    Class: Acute   Nicotine dependence, cigarettes, uncomplicated 0000000   Bilateral edema of lower extremity 01/22/2020   Edema 06/23/2019   CAD (coronary artery disease) 06/23/2019   Chronic respiratory failure with hypoxia ([place]) 01/17/2019   Cellulitis of right lower extremity 04/13/2017   GERD (gastroesophageal reflux disease) 04/13/2017   Esophageal stricture    Dysphagia    Abnormal barium swallow    History of colonic polyps    Polyp of ascending colon    Polyp of transverse colon    Acute URI 12/13/2016   Coronary artery calcification 10/23/2016   Hyperlipidemia LDL goal <100  10/23/2016   Medication management 10/23/2016   DOE (dyspnea on exertion) 10/23/2016   Lung nodule 02/26/2015   Nodule of right lung 11/28/2014   Acute calculous cholecystitis 06/22/2014   Acute cholecystitis 06/22/2014   Pre-operative respiratory examination 06/22/2014   Abnormal chest x-ray 05/28/2014   COPD Gold II D, still smoking 05/07/2014   Nicotine abuse 03/27/2014   Hypokalemia 03/27/2014   Leukocytosis 03/27/2014   TOBACCO USE 11/05/2008   COPD (chronic obstructive pulmonary disease) ([place]) 11/05/2008   HYPERGLYCEMIA  11/05/2008   Anemia 07/21/2007    Past Surgical History:  Procedure Laterality Date   BALLOON DILATION N/A 03/08/2017   Procedure: BALLOON DILATION;  Surgeon: Manus Gunning, MD;  Location: [facility];  Service: Gastroenterology;  Laterality: N/A;   CATARACT EXTRACTION Right    August 15, 2013   CHOLECYSTECTOMY N/A 06/22/2014   Procedure: LAPAROSCOPIC CHOLECYSTECTOMY WITH INTRAOPERATIVE CHOLANGIOGRAM;  Surgeon: Gayland Curry, MD;  Location: [facility];  Service: General;  Laterality: N/A;   COLONOSCOPY N/A 02/19/2017   Procedure: COLONOSCOPY;  Surgeon: Manus Gunning, MD;  Location: [facility];  Service: Gastroenterology;  Laterality: N/A;   ESOPHAGOGASTRODUODENOSCOPY N/A 02/19/2017   Procedure: ESOPHAGOGASTRODUODENOSCOPY (EGD);  Surgeon: Manus Gunning, MD;  Location: [facility];  Service: Gastroenterology;  Laterality: N/A;   ESOPHAGOGASTRODUODENOSCOPY N/A 03/08/2017   Procedure: ESOPHAGOGASTRODUODENOSCOPY (EGD);  Surgeon: Manus Gunning, MD;  Location: [facility];  Service: Gastroenterology;  Laterality: N/A;   ESOPHAGOGASTRODUODENOSCOPY N/A 04/06/2017   Procedure: ESOPHAGOGASTRODUODENOSCOPY (EGD);  Surgeon: Manus Gunning, MD;  Location: [facility];  Service: Gastroenterology;  Laterality: N/A;   ESOPHAGOGASTRODUODENOSCOPY (EGD) WITH PROPOFOL N/A 04/27/2017   Procedure: ESOPHAGOGASTRODUODENOSCOPY (EGD) WITH PROPOFOL;  Surgeon: Manus Gunning, MD;  Location: [facility];  Service: Gastroenterology;  Laterality: N/A;   NM MYOVIEW LTD  10/2016    EF 55-65% with normal LV function. LOW RISK. No ischemia or infarction.   TONSILLECTOMY     TRANSTHORACIC ECHOCARDIOGRAM  06/2019   EF 60 to 65%.  No R WMA.  Normal RV function.  Mild calcification of aortic valve but no stenosis.  Mild mitral valve leaflet calcification but no stenosis or regurgitation.     OB History   No obstetric history on file.     Family History  Problem Relation Age  of Onset   Alzheimer's disease Mother    Diabetes Mother    Cancer Father    Stomach cancer Paternal Grandmother    Colon cancer Neg Hx    Esophageal cancer Neg Hx     Social History   Tobacco Use   Smoking status: Former    Packs/day: 0.50    Years: 60.00    Pack years: 30.00    Types: Cigarettes    Quit date: 02/06/2019    Years since quitting: 2.4   Smokeless tobacco: Never   Tobacco comments:    01/17/19 down to 1-2 cigs per day  Vaping Use   Vaping Use: Never used  Substance Use Topics   Alcohol use: No    Alcohol/week: 0.0 standard drinks   Drug use: No    Home Medications Prior to Admission medications   Medication Sig Start Date End Date Taking? Authorizing Provider  acetaminophen (TYLENOL) 325 MG tablet Take 2 tablets (650 mg total) by mouth every 6 (six) hours as needed for mild pain (or Fever >/= 101). 01/26/20  Yes Nita Sells, MD  albuterol (PROVENTIL HFA;VENTOLIN HFA) 108 (90 Base) MCG/ACT inhaler Inhale 2 puffs into the lungs  every 6 (six) hours as needed for wheezing or shortness of breath. 12/20/18  Yes Brand Males, MD  albuterol (PROVENTIL) (2.5 MG/3ML) 0.083% nebulizer solution Take 3 mLs (2.5 mg total) by nebulization every 8 (eight) hours as needed for wheezing. 05/07/21  Yes Brand Males, MD  bisacodyl (DULCOLAX) 5 MG EC tablet Take 10 mg by mouth daily as needed for moderate constipation.   Yes [provider]  BREZTRI AEROSPHERE 160-9-4.8 MCG/ACT AERO INHALE 2 INHALATIONS BY  MOUTH INTO THE LUNGS IN THE MORNING AND AT BEDTIME Patient taking differently: Inhale 2 puffs into the lungs in the morning and at bedtime. 07/14/21  Yes Brand Males, MD  docusate sodium (COLACE) 100 MG capsule Take 100 mg by mouth 2 (two) times daily.   Yes [provider]  ferrous sulfate 325 (65 FE) MG tablet Take 325 mg by mouth daily with breakfast.   Yes [provider]  furosemide (LASIX) 40 MG tablet TAKE 1 TABLET(40 MG) BY  MOUTH DAILY.may take an additional 40 mg if need for increase swelling or shortness of breath Patient taking differently: Take 40 mg by mouth daily. may take an additional 40 mg if need for increase swelling or shortness of breath 03/04/21  Yes Leonie Man, MD  hydroxypropyl methylcellulose / hypromellose (ISOPTO TEARS / GONIOVISC) 2.5 % ophthalmic solution Place 2 drops into both eyes 3 (three) times daily as needed for dry eyes.   Yes [provider]  ibuprofen (ADVIL) 200 MG tablet Take 400 mg by mouth every 6 (six) hours as needed for mild pain.   Yes [provider]  levalbuterol (XOPENEX) 1.25 MG/0.5ML nebulizer solution Take 1.25 mg by nebulization 4 (four) times daily. 07/09/21  Yes Barb Merino, MD  Multiple Vitamins-Minerals (MULTIVITAMIN WITH MINERALS) tablet Take 1 tablet by mouth daily.   Yes [provider]  OXYGEN Inhale 3 L into the lungs continuous.   Yes [provider]  polyethylene glycol (MIRALAX / GLYCOLAX) 17 g packet Take 17 g by mouth daily as needed for mild constipation. 07/09/21  Yes Ghimire, Dante Gang, MD  Propylene Glycol (SYSTANE BALANCE) 0.6 % SOLN Place 1-2 drops into both eyes daily as needed (dry eyes).   Yes [provider]  senna (SENOKOT) 8.6 MG tablet Take 2 tablets by mouth at bedtime.   Yes [provider]  sodium chloride (OCEAN) 0.65 % nasal spray Place 1 spray into the nose 6 (six) times daily.   Yes [provider]  predniSONE (DELTASONE) 10 MG tablet 4 tabs daily for 3 days 3 tabs daily for 3 days 2 tabs daily for 3 days 1 tabs daily for 3 days Patient not taking: No sig reported 07/09/21   Barb Merino, MD    Allergies    Spiriva handihaler [tiotropium bromide monohydrate] and Banana  Review of Systems   Review of Systems  Unable to perform ROS: Severe respiratory distress  Respiratory:  Positive for shortness of breath.    Physical Exam Updated Vital Signs BP (!) 77/56   Pulse (!)  34   Temp 99 F (37.2 C) (Rectal)   Resp (!) 25   Ht '5\' 2"'$  (1.575 m)   Wt 61.7 kg   SpO2 (!) 76%   BMI 24.87 kg/m   Physical Exam Vitals and nursing note reviewed.  Constitutional:      General: She is in acute distress.     Appearance: She is ill-appearing.  HENT:     Head: Normocephalic and atraumatic.  Mouth/Throat:     Mouth: Mucous membranes are moist.  Eyes:     Extraocular Movements: Extraocular movements intact.     Conjunctiva/sclera: Conjunctivae normal.  Cardiovascular:     Rate and Rhythm: Regular rhythm. Tachycardia present.     Heart sounds: No murmur heard. Pulmonary:     Effort: Tachypnea, accessory muscle usage and respiratory distress present.     Breath sounds: Decreased breath sounds present.  Chest:     Chest wall: No tenderness.  Abdominal:     Palpations: Abdomen is soft.     Tenderness: There is no abdominal tenderness.  Musculoskeletal:     Cervical back: Normal range of motion and neck supple.     Right lower leg: No edema.     Left lower leg: No edema.  Skin:    General: Skin is warm and dry.  Neurological:     General: No focal deficit present.    ED Results / Procedures / Treatments   Labs (all labs ordered are listed, but only abnormal results are displayed) Labs Reviewed  COMPREHENSIVE METABOLIC PANEL - Abnormal; Notable for the following components:      Result Value   Chloride 93 (*)    Glucose, Bld 105 (*)    BUN 85 (*)    Creatinine, Ser 1.79 (*)    Calcium 8.5 (*)    Total Protein 6.0 (*)    Albumin 2.1 (*)    AST 60 (*)    Alkaline Phosphatase 161 (*)    GFR, Estimated 28 (*)    Anion gap 18 (*)    All other components within normal limits  MAGNESIUM - Abnormal; Notable for the following components:   Magnesium 3.0 (*)    All other components within normal limits  CBC WITH DIFFERENTIAL/PLATELET - Abnormal; Notable for the following components:   WBC 44.6 (*)    RBC 3.46 (*)    Hemoglobin 10.9 (*)    HCT 33.7  (*)    RDW 16.2 (*)    Platelets 537 (*)    Neutro Abs 40.8 (*)    Monocytes Absolute 1.1 (*)    Abs Immature Granulocytes 1.85 (*)    All other components within normal limits  BLOOD GAS, VENOUS - Abnormal; Notable for the following components:   pH, Ven 7.171 (*)    pCO2, Ven 76.2 (*)    pO2, Ven 30.6 (*)    Acid-base deficit 2.8 (*)    All other components within normal limits  LACTIC ACID, PLASMA - Abnormal; Notable for the following components:   Lactic Acid, Venous 6.5 (*)    All other components within normal limits  LACTIC ACID, PLASMA - Abnormal; Notable for the following components:   Lactic Acid, Venous 6.4 (*)    All other components within normal limits  BLOOD GAS, VENOUS - Abnormal; Notable for the following components:   pH, Ven 7.184 (*)    pO2, Ven 130.0 (*)    Acid-base deficit 6.9 (*)    All other components within normal limits  TROPONIN I (HIGH SENSITIVITY) - Abnormal; Notable for the following components:   Troponin I (High Sensitivity) 47 (*)    All other components within normal limits  RESP PANEL BY RT-PCR (FLU A&B, COVID) ARPGX2  MRSA NEXT GEN BY PCR, NASAL  CULTURE, BLOOD (ROUTINE X 2)  CULTURE, BLOOD (ROUTINE X 2)  CULTURE, BLOOD (ROUTINE X 2)  CULTURE, BLOOD (ROUTINE X 2)  URINE CULTURE  LIPASE, BLOOD  LEGIONELLA PNEUMOPHILA SEROGP 1 UR AG  CBC  COMPREHENSIVE METABOLIC PANEL  MAGNESIUM  PHOSPHORUS  PROCALCITONIN  LEGIONELLA PNEUMOPHILA SEROGP 1 UR AG  STREP PNEUMONIAE URINARY ANTIGEN  URINALYSIS, ROUTINE W REFLEX MICROSCOPIC  HEMOGLOBIN A1C  CBC  BASIC METABOLIC PANEL  BLOOD GAS, ARTERIAL  MAGNESIUM  PHOSPHORUS  TROPONIN I (HIGH SENSITIVITY)    EKG EKG Interpretation  Date/Time:  July 24, 2021 06:44:54 EDT Ventricular Rate:  135 PR Interval:  127 QRS Duration: 103 QT Interval:  317 QTC Calculation: 470 R Axis:   61 Text Interpretation: Sinus tachycardia Atrial premature complexes Low voltage with right axis  deviation Rate is faster Confirmed by Shanon Rosser [phone]) on 07/24/2021 6:49:20 AM  Radiology DG Chest 2 View  Result Date: 2021/07/24 CLINICAL DATA:  Shortness of breath. EXAM: CHEST - 2 VIEW COMPARISON:  Chest x-ray dated July 07, 2021. FINDINGS: The heart size and mediastinal contours are within normal limits. New patchy consolidation throughout the right lower lobe, most dense in the superior segment. New small right pleural effusion. Severe emphysematous changes again noted. No pneumothorax. No acute osseous abnormality. IMPRESSION: 1. New right lower lobe pneumonia and small right pleural effusion. Followup PA and lateral chest X-ray is recommended in 3-4 weeks following trial of antibiotic therapy to ensure resolution. Electronically Signed   By: Titus Dubin M.D.   On: Jul 24, 2021 07:27    Procedures Procedures   Medications Ordered in ED Medications  0.9 %  sodium chloride infusion (250 mLs Intravenous Not Given 2021/07/24 1547)  norepinephrine (LEVOPHED) '4mg'$  in 212m premix infusion (0 mcg/min Intravenous Stopped 909-08-221543)  amiodarone (NEXTERONE) 1.8 mg/mL load via infusion 150 mg (150 mg Intravenous Bolus from Bag 909-08-20221210)    Followed by  amiodarone (NEXTERONE PREMIX) 360-4.14 MG/200ML-% (1.8 mg/mL) IV infusion (0 mg/hr Intravenous Stopped 9Sep 08, 20221543)    Followed by  amiodarone (NEXTERONE PREMIX) 360-4.14 MG/200ML-% (1.8 mg/mL) IV infusion (30 mg/hr Intravenous Not Given 909-08-221548)  docusate sodium (COLACE) capsule 100 mg (has no administration in time range)  polyethylene glycol (MIRALAX / GLYCOLAX) packet 17 g (has no administration in time range)  heparin injection 5,000 Units (has no administration in time range)  ipratropium-albuterol (DUONEB) 0.5-2.5 (3) MG/3ML nebulizer solution 3 mL (3 mLs Nebulization Given 909/08/221207)  budesonide (PULMICORT) nebulizer solution 0.25 mg (0.25 mg Nebulization Given 909/08/20221207)  dextrose 5 % in lactated ringers infusion (  Intravenous Not Given 92022-09-081548)  lactated ringers bolus 500 mL (has no administration in time range)  fentaNYL (SUBLIMAZE) injection 25-100 mcg (has no administration in time range)  insulin aspart (novoLOG) injection 0-9 Units (has no administration in time range)  phenylephrine (NEO-SYNEPHRINE) 20 mg in sodium chloride 0.9 % 250 mL (0.08 mg/mL) infusion (has no administration in time range)  0.9 %  sodium chloride infusion (250 mLs Intravenous Not Given 9September 08, 20221547)  sodium bicarbonate 150 mEq in sterile water 1,150 mL infusion ( Intravenous Not Given 909/08/20221549)  famotidine (PEPCID) IVPB 20 mg premix (has no administration in time range)  hydrocortisone sodium succinate (SOLU-CORTEF) 100 MG injection 100 mg (has no administration in time range)  albuterol (PROVENTIL,VENTOLIN) solution continuous neb (10 mg/hr Nebulization Given 9September 08, 20220750)  ceFEPIme (MAXIPIME) 2 g in sodium chloride 0.9 % 100 mL IVPB (0 g Intravenous Stopped 909-08-220852)  lactated ringers bolus 500 mL (0 mLs Intravenous Stopped 909/08/221550)  vancomycin (VANCOREADY) IVPB 1250 mg/250 mL (0 mg Intravenous Stopped 9Sep 08, 20221031)  lactated ringers bolus 1,500  mL (0 mLs Intravenous Stopped Aug 20, 2021 0937)  sodium bicarbonate injection 100 mEq (100 mEq Intravenous Given August 20, 2021 1259)    ED Course  I have reviewed the triage vital signs and the nursing notes.  Pertinent labs & imaging results that were available during my care of the patient were reviewed by me and considered in my medical decision making (see chart for details).    MDM Rules/Calculators/A&P                         CRITICAL CARE Performed by: Valerie Gay   Total critical care time: 45 minutes  Critical care time was exclusive of separately billable procedures and treating other patients.  Critical care was necessary to treat or prevent imminent or life-threatening deterioration.  Critical care was time spent personally by me on the following activities:  development of treatment plan with patient and/or surrogate as well as nursing, discussions with consultants, evaluation of patient's response to treatment, examination of patient, obtaining history from patient or surrogate, ordering and performing treatments and interventions, ordering and review of laboratory studies, ordering and review of radiographic studies, pulse oximetry and re-evaluation of patient's condition.   This patient is a 80 year old female with history of COPD, on supplemental oxygen at baseline, presenting via EMS for worsening dyspnea over the past 3 days.  Interventions prior to arrival include 125 mg of Solu-Medrol and multiple nebulized breathing treatments.  On arrival is awake and is able to respond to questioning.  She is severely ill-appearing.  She continues to be in respiratory distress.  Vital signs are notable for tachycardia, tachypnea, hypotension, and hypoxia despite supplemental oxygen.  On lung auscultation, patient has severely diminished air movement.  Respiratory therapist was called to initiate BiPAP.  Continued inline breathing treatments were ordered.  Chest x-ray showed a new right-sided pneumonia.  Patient was treated for HCAP.  30 cc/kg of IVF was started.  Levophed was initiated as well for hypotension.  I asked the patient if she would want intubation if her breathing worsens.  Patient stated that she would not.  However, given her respiratory distress, likely sepsis, and likely hypercapnia, I feel that her capacity to make informed medical decisions is questionable at this time.  I spoke with the patient's daughter over the phone.  Patient's daughter stated that the patient does have DNR paperwork but is unsure if her mother would want intubation.  Patient's daughter stated that she would be on her way to the emergency department.  Initial blood gas showed mixed acidosis.  Further lab work showed marked leukocytosis, consistent with sepsis of presumed pulmonary  source.  Initial lactic acid was elevated at 6.5 consistent with severe sepsis and poor prognosis.  Levophed was uptitrated to 6 mcg/min.  At this point, patient did have stable blood pressures in the normal range.  Repeat blood gas showed improvement in hypercarbia.  Patient was tolerating the BiPAP well.  BiPAP was continued.  Patient was admitted to ICU.  ICU physician on-call stated that he does not know this patient from pulmonology clinic visits.  He was able to come to the ED and visit with the patient and her family at bedside.  Following the discussion that took place, patient's CODE STATUS was made DNR/DNI.  While in the ED, patient developed worsening tachycardia.  EKG showed atrial fibrillation with RVR.  Given her hypotension, AV nodal blockers were avoided.  Following discussion with ICU physician, bolus and infusion of amiodarone  was ordered.  Final Clinical Impression(s) / ED Diagnoses Final diagnoses:  Acute on chronic respiratory failure with hypoxia and hypercapnia (HCC)  Respiratory distress  Pneumonia of right lower lobe due to infectious organism    Rx / DC Orders ED Discharge Orders     None        Valerie Pick, MD 08-21-2021 1702

## 2021-08-16 NOTE — Sepsis Progress Note (Signed)
eLink is monitoring this Code Sepsis. °

## 2021-08-16 NOTE — Death Summary Note (Addendum)
DISCHARGE SUMMARY    Date of admit: Jul 28, 2021  6:30 AM Date of discharge: No discharge date for patient encounter. Length of Stay: 0 days  PCP is Kathyrn Lass, MD  CAUSE(S) OF DEATH  Septic Shock due to Pneumonia (HCAP)  Present on Admission Present on Admission:  Protein-calorie malnutrition, severe ([place])  Failure to thrive in adult  Pulmonary cachexia due to COPD ([place])  DNR (do not resuscitate)  Acute on chronic respiratory failure with hypoxia and hypercapnia ([place])  HCAP (healthcare-associated pneumonia)  Lactic acidosis  Severe sepsis (HCC)  AKI (acute kidney injury) ([place])  Atrial fibrillation with RVR ([place])  Acute encephalopathy  Anemia  COPD Gold II D, still smoking  CAD (coronary artery disease)  Leukocytosis  Chronic respiratory failure with hypoxia (HCC)  Acute and chronic respiratory failure (acute-on-chronic) (HCC)  Septic shock (HCC)  Cardiac arrest (HCC)   Active Problems Principal Problem:   Septic shock (HCC) Active Problems:   Acute on chronic respiratory failure with hypoxia and hypercapnia (HCC)   HCAP (healthcare-associated pneumonia)   Lactic acidosis   Severe sepsis (HCC)   AKI (acute kidney injury) (HCC)   Atrial fibrillation with RVR (HCC)   Acute encephalopathy   Anemia   Leukocytosis   Chronic respiratory failure with hypoxia (HCC)   Protein-calorie malnutrition, severe (HCC)   Failure to thrive in adult   Pulmonary cachexia due to COPD ([place])   DNR (do not resuscitate)   Palliative care status   Resides in skilled nursing facility   Comfort measures only status   COPD Gold II D, still smoking   CAD (coronary artery disease)   Acute and chronic respiratory failure (acute-on-chronic) (HCC)   Comprehensive Problem List Patient Active Problem List   Diagnosis Date Noted   Acute on chronic respiratory failure with hypoxia and hypercapnia ([place]) July 28, 2021    Priority: High   HCAP (healthcare-associated pneumonia) 07-28-2021     Priority: High   Lactic acidosis 2021-07-28    Priority: High    Class: Acute   Severe sepsis ([place]) 07-28-2021    Priority: High    Class: Acute   AKI (acute kidney injury) ([place]) 07/28/2021    Priority: High    Class: Acute   Atrial fibrillation with RVR ([place]) 07/28/21    Priority: High    Class: Acute   Acute encephalopathy Jul 28, 2021    Priority: High    Class: Acute   Septic shock ([place]) 07-28-2021    Priority: High    Class: Acute   Protein-calorie malnutrition, severe ([place]) 07-28-2021    Priority: Medium   Failure to thrive in adult 07/28/2021    Priority: Medium   Pulmonary cachexia due to COPD ([place]) 2021/07/28    Priority: Medium    Class: Chronic   DNR (do not resuscitate) 2021-07-28    Priority: Medium   Palliative care status 07-28-21    Priority: Medium    Class: Present on Admission   Resides in skilled nursing facility 07-28-2021    Priority: Medium    Class: Present on Admission   Comfort measures only status 07/28/21    Priority: Medium    Class: Acute   Chronic respiratory failure with hypoxia (HCC) 01/17/2019    Priority: Medium   Leukocytosis 03/27/2014    Priority: Medium   Anemia 07/21/2007    Priority: Medium   CAD (coronary artery disease) 06/23/2019    Priority: Low   COPD Gold II D, still smoking 05/07/2014    Priority: Low  TOBACCO USE 11/05/2008    Priority: Low   Acute and chronic respiratory failure (acute-on-chronic) ([place]) 07-31-2021   Nicotine dependence, cigarettes, uncomplicated 0000000   Bilateral edema of lower extremity 01/22/2020   Edema 06/23/2019   Cellulitis of right lower extremity 04/13/2017   GERD (gastroesophageal reflux disease) 04/13/2017   Esophageal stricture    Dysphagia    Abnormal barium swallow    History of colonic polyps    Polyp of ascending colon    Polyp of transverse colon    Acute URI 12/13/2016   Coronary artery calcification 10/23/2016   Hyperlipidemia LDL goal <100 10/23/2016    Medication management 10/23/2016   DOE (dyspnea on exertion) 10/23/2016   Lung nodule 02/26/2015   Nodule of right lung 11/28/2014   Acute calculous cholecystitis 06/22/2014   Acute cholecystitis 06/22/2014   Pre-operative respiratory examination 06/22/2014   Abnormal chest x-ray 05/28/2014   Nicotine abuse 03/27/2014   Hypokalemia 03/27/2014   COPD (chronic obstructive pulmonary disease) ([place]) 11/05/2008   HYPERGLYCEMIA 11/05/2008      SUMMARY Valerie Gay was 80 y.o. patient with    has a past medical history of Acute encephalopathy (2021/07/31), Acute on chronic respiratory failure with hypoxia and hypercapnia ([place]) (31-Jul-2021), AKI (acute kidney injury) ([place]) (07-31-2021), Anemia (age 39), Atrial fibrillation with RVR ([place]) (07/31/21), Cellulitis and abscess of foot (03/2017), Colon polyps, Comfort measures only status (07/31/21), COPD (chronic obstructive pulmonary disease) ([place]), Diverticulosis, DNR (do not resuscitate) (31-Jul-2021), History of hiatal hernia, History of kidney stones, Hyperlipidemia, Lactic acidosis (2021-07-31), On home oxygen therapy, Palliative care status (2021-07-31), Protein-calorie malnutrition, severe ([place]) (07/31/21), Resides in skilled nursing facility (07/31/21), Severe sepsis ([place]) (07/31/2021), and Shock circulatory ([place]) (31-Jul-2021).   has a past surgical history that includes Tonsillectomy; Cataract extraction (Right); Cholecystectomy (N/A, 06/22/2014); NM MYOVIEW LTD (10/2016); Colonoscopy (N/A, 02/19/2017); Esophagogastroduodenoscopy (N/A, 02/19/2017); Balloon dilation (N/A, 03/08/2017); Esophagogastroduodenoscopy (N/A, 03/08/2017); Esophagogastroduodenoscopy (N/A, 04/06/2017); Esophagogastroduodenoscopy (egd) with propofol (N/A, 04/27/2017); and transthoracic echocardiogram (06/2019).   Admitted on Jul 31, 2021 with    80 year old female followed by Dr. Chase Caller for severe COPD on chronic hypoxemic respiratory failure 3 L oxygen, smoker as of December 2021 with  dyslipidemia and coronary artery calcification with history of normal cardiac Myoview.  Seen in April 2022 at pulmonary office for COPD exacerbation.  According to the daughter sometimes starting July 2022 started showing self-neglect with diminished p.o. intake and failure to thrive and significant weight loss consistent with protein calorie malnutrition as well.  Then admitted 07/01/2021 through 07/09/2021 with right lower extremity cellulitis that improved with antibiotics.  She also had fluctuant swelling in the dorsum of the right foot.  Seen by orthopedics and recommended conservative treatment.  She was then discharged to [facility] skilled nursing facility.    Admitted then on 07/31/21 with severe acute on chronic hypoxemic respiratory failure with respiratory acidosis and lactic acidosis of 6 and a high white count leukocytosis, chronic anemia and acute kidney injury and circulatory shock requiring Levophed and treatment BiPAP.  Given cefepime by the ED.  Also acute encephalopathy that is associated with the above.  Chest x-ray consistent with a new onset right middle lobe pneumonia [August 20 22 chest x-ray did not have this).  Critical care medicine called to admit.  She is COVID-negative     Course in ER  Admit orders written. Ptient on BiPAP, pressors, amio, antibiotics.  Problem list as above. DNR and concurrent comfort status as goals of crqe.  PRogressive worsening shock and expired in  ER after cardiac arrest     SIGNED Dr. Brand Males, M.D., [facility].C.P Pulmonary and Critical Care Medicine Staff Physician [place] Pager: [phone], If no answer or between  15:00h - 7:00h: call [phone]  2021/07/23 2:27 PM

## 2021-08-16 NOTE — ED Provider Notes (Signed)
Shortly after 1300, I was notified that patient had lost pulses.  Per previous discussion with intensivist and family members at time of admission, decision had been made for DNR/DNI CODE STATUS.  On assessment, patient is unresponsive.  There were no palpable femoral or carotid pulses.  Patient had no corneal reflexes.  Time of death was declared at 1304.  Family members were able to return to the ED.  I informed them in person of the patient's death.   Godfrey Pick, MD 08-16-2021 1414

## 2021-08-16 DEATH — deceased

## 2021-08-21 DIAGNOSIS — I469 Cardiac arrest, cause unspecified: Secondary | ICD-10-CM | POA: Diagnosis present
